# Patient Record
Sex: Male | Born: 1945 | Race: White | Hispanic: No | Smoking: Former smoker
Health system: Southern US, Community
[De-identification: ages and names within clinical notes are randomized; demographics above are authoritative.]

## PROBLEM LIST (undated history)

## (undated) DIAGNOSIS — F29 Unspecified psychosis not due to a substance or known physiological condition: Secondary | ICD-10-CM

## (undated) DIAGNOSIS — Z5900 Homelessness unspecified: Secondary | ICD-10-CM

## (undated) DIAGNOSIS — M792 Neuralgia and neuritis, unspecified: Secondary | ICD-10-CM

## (undated) DIAGNOSIS — J449 Chronic obstructive pulmonary disease, unspecified: Secondary | ICD-10-CM

## (undated) DIAGNOSIS — I712 Thoracic aortic aneurysm, without rupture: Secondary | ICD-10-CM

## (undated) DIAGNOSIS — I495 Sick sinus syndrome: Secondary | ICD-10-CM

## (undated) DIAGNOSIS — I5042 Chronic combined systolic (congestive) and diastolic (congestive) heart failure: Secondary | ICD-10-CM

## (undated) DIAGNOSIS — I350 Nonrheumatic aortic (valve) stenosis: Secondary | ICD-10-CM

## (undated) DIAGNOSIS — F419 Anxiety disorder, unspecified: Secondary | ICD-10-CM

## (undated) DIAGNOSIS — I2510 Atherosclerotic heart disease of native coronary artery without angina pectoris: Secondary | ICD-10-CM

## (undated) DIAGNOSIS — Z950 Presence of cardiac pacemaker: Secondary | ICD-10-CM

## (undated) DIAGNOSIS — F329 Major depressive disorder, single episode, unspecified: Secondary | ICD-10-CM

## (undated) DIAGNOSIS — J939 Pneumothorax, unspecified: Secondary | ICD-10-CM

## (undated) DIAGNOSIS — N400 Enlarged prostate without lower urinary tract symptoms: Secondary | ICD-10-CM

## (undated) DIAGNOSIS — F32A Depression, unspecified: Secondary | ICD-10-CM

## (undated) DIAGNOSIS — I509 Heart failure, unspecified: Secondary | ICD-10-CM

## (undated) DIAGNOSIS — I7121 Aneurysm of the ascending aorta, without rupture: Secondary | ICD-10-CM

## (undated) DIAGNOSIS — Z720 Tobacco use: Secondary | ICD-10-CM

## (undated) DIAGNOSIS — F319 Bipolar disorder, unspecified: Secondary | ICD-10-CM

## (undated) DIAGNOSIS — R7989 Other specified abnormal findings of blood chemistry: Secondary | ICD-10-CM

## (undated) HISTORY — PX: AORTIC VALVE REPLACEMENT: SHX41

## (undated) HISTORY — PX: CARDIAC SURGERY: SHX584

## (undated) HISTORY — PX: HIATAL HERNIA REPAIR: SHX195

## (undated) HISTORY — PX: LUMBAR FUSION: SHX111

## (undated) HISTORY — PX: KNEE SURGERY: SHX244

---

## 2005-10-28 HISTORY — PX: PACEMAKER PLACEMENT: SHX43

## 2014-09-01 ENCOUNTER — Emergency Department (EMERGENCY_DEPARTMENT_HOSPITAL)
Admission: EM | Admit: 2014-09-01 | Discharge: 2014-09-05 | Disposition: A | Discharge status: Home / Self Care | Payer: Medicare Other | Source: Home / Self Care | Attending: Emergency Medicine | Admitting: Emergency Medicine

## 2014-09-01 ENCOUNTER — Emergency Department (HOSPITAL_COMMUNITY): Payer: Medicare Other

## 2014-09-01 ENCOUNTER — Encounter (HOSPITAL_COMMUNITY): Payer: Self-pay | Admitting: Emergency Medicine

## 2014-09-01 DIAGNOSIS — F419 Anxiety disorder, unspecified: Secondary | ICD-10-CM | POA: Insufficient documentation

## 2014-09-01 DIAGNOSIS — R0600 Dyspnea, unspecified: Secondary | ICD-10-CM

## 2014-09-01 DIAGNOSIS — J441 Chronic obstructive pulmonary disease with (acute) exacerbation: Secondary | ICD-10-CM | POA: Insufficient documentation

## 2014-09-01 DIAGNOSIS — Z87891 Personal history of nicotine dependence: Secondary | ICD-10-CM | POA: Insufficient documentation

## 2014-09-01 DIAGNOSIS — R079 Chest pain, unspecified: Secondary | ICD-10-CM

## 2014-09-01 DIAGNOSIS — Z950 Presence of cardiac pacemaker: Secondary | ICD-10-CM | POA: Insufficient documentation

## 2014-09-01 DIAGNOSIS — Z954 Presence of other heart-valve replacement: Secondary | ICD-10-CM | POA: Insufficient documentation

## 2014-09-01 DIAGNOSIS — F309 Manic episode, unspecified: Secondary | ICD-10-CM

## 2014-09-01 DIAGNOSIS — F312 Bipolar disorder, current episode manic severe with psychotic features: Secondary | ICD-10-CM | POA: Diagnosis not present

## 2014-09-01 DIAGNOSIS — F29 Unspecified psychosis not due to a substance or known physiological condition: Secondary | ICD-10-CM

## 2014-09-01 DIAGNOSIS — J449 Chronic obstructive pulmonary disease, unspecified: Secondary | ICD-10-CM

## 2014-09-01 DIAGNOSIS — I2510 Atherosclerotic heart disease of native coronary artery without angina pectoris: Secondary | ICD-10-CM | POA: Diagnosis present

## 2014-09-01 DIAGNOSIS — F3010 Manic episode without psychotic symptoms, unspecified: Secondary | ICD-10-CM

## 2014-09-01 DIAGNOSIS — Z952 Presence of prosthetic heart valve: Secondary | ICD-10-CM

## 2014-09-01 DIAGNOSIS — N400 Enlarged prostate without lower urinary tract symptoms: Secondary | ICD-10-CM | POA: Diagnosis present

## 2014-09-01 DIAGNOSIS — I5032 Chronic diastolic (congestive) heart failure: Secondary | ICD-10-CM | POA: Diagnosis present

## 2014-09-01 HISTORY — DX: Chronic obstructive pulmonary disease, unspecified: J44.9

## 2014-09-01 LAB — COMPREHENSIVE METABOLIC PANEL
ALT: 21 U/L (ref 0–53)
AST: 20 U/L (ref 0–37)
Albumin: 3.3 g/dL — ABNORMAL LOW (ref 3.5–5.2)
Alkaline Phosphatase: 93 U/L (ref 39–117)
Anion gap: 15 (ref 5–15)
BUN: 10 mg/dL (ref 6–23)
CO2: 25 mEq/L (ref 19–32)
Calcium: 9.1 mg/dL (ref 8.4–10.5)
Chloride: 100 mEq/L (ref 96–112)
Creatinine, Ser: 0.9 mg/dL (ref 0.50–1.35)
GFR calc Af Amer: >90 mL/min (ref >90)
GFR calc non Af Amer: 85 mL/min — ABNORMAL LOW (ref >90)
Glucose, Bld: 91 mg/dL (ref 70–99)
Potassium: 4 mEq/L (ref 3.7–5.3)
Sodium: 140 mEq/L (ref 137–147)
Total Bilirubin: 0.9 mg/dL (ref 0.3–1.2)
Total Protein: 6.9 g/dL (ref 6.0–8.3)

## 2014-09-01 LAB — CBC WITH DIFFERENTIAL/PLATELET
Basophils Absolute: 0 10*3/uL (ref 0.0–0.1)
Basophils Relative: 0 % (ref 0–1)
Eosinophils Absolute: 0.1 10*3/uL (ref 0.0–0.7)
Eosinophils Relative: 1 % (ref 0–5)
HCT: 41.2 % (ref 39.0–52.0)
Hemoglobin: 13 g/dL (ref 13.0–17.0)
Lymphocytes Relative: 15 % (ref 12–46)
Lymphs Abs: 2.4 10*3/uL (ref 0.7–4.0)
MCH: 29.6 pg (ref 26.0–34.0)
MCHC: 31.6 g/dL (ref 30.0–36.0)
MCV: 93.8 fL (ref 78.0–100.0)
Monocytes Absolute: 1.3 10*3/uL — ABNORMAL HIGH (ref 0.1–1.0)
Monocytes Relative: 8 % (ref 3–12)
Neutro Abs: 11.6 10*3/uL — ABNORMAL HIGH (ref 1.7–7.7)
Neutrophils Relative %: 76 % (ref 43–77)
Platelets: 241 10*3/uL (ref 150–400)
RBC: 4.39 MIL/uL (ref 4.22–5.81)
RDW: 16.2 % — ABNORMAL HIGH (ref 11.5–15.5)
WBC: 15.4 10*3/uL — ABNORMAL HIGH (ref 4.0–10.5)

## 2014-09-01 LAB — CBG MONITORING, ED
Glucose-Capillary: 164 mg/dL — ABNORMAL HIGH (ref 70–99)
Glucose-Capillary: 84 mg/dL (ref 70–99)

## 2014-09-01 LAB — PROTIME-INR
INR: 1.06 (ref 0.00–1.49)
Prothrombin Time: 14 seconds (ref 11.6–15.2)

## 2014-09-01 LAB — ETHANOL: Alcohol, Ethyl (B): <11 mg/dL (ref 0–11)

## 2014-09-01 LAB — TROPONIN I: Troponin I: <0.3 ng/mL (ref <0.30)

## 2014-09-01 LAB — PRO B NATRIURETIC PEPTIDE: Pro B Natriuretic peptide (BNP): 653.8 pg/mL — ABNORMAL HIGH (ref 0–125)

## 2014-09-01 MED ORDER — LORAZEPAM 2 MG/ML IJ SOLN
1.0000 mg | Freq: Once | INTRAMUSCULAR | Status: AC
  Administered 2014-09-01: 1 mg via INTRAVENOUS
  Filled 2014-09-01: qty 1

## 2014-09-01 MED ORDER — IPRATROPIUM-ALBUTEROL 0.5-2.5 (3) MG/3ML IN SOLN
3.0000 mL | Freq: Four times a day (QID) | RESPIRATORY_TRACT | Status: DC | PRN
  Administered 2014-09-02 – 2014-09-03 (×4): 3 mL via RESPIRATORY_TRACT
  Filled 2014-09-01 (×6): qty 3

## 2014-09-01 MED ORDER — HALOPERIDOL LACTATE 5 MG/ML IJ SOLN
5.0000 mg | Freq: Once | INTRAMUSCULAR | Status: AC
  Administered 2014-09-01: 5 mg via INTRAMUSCULAR
  Filled 2014-09-01: qty 1

## 2014-09-01 MED ORDER — IPRATROPIUM-ALBUTEROL 0.5-2.5 (3) MG/3ML IN SOLN
3.0000 mL | Freq: Once | RESPIRATORY_TRACT | Status: AC
  Administered 2014-09-01 (×2): 3 mL via RESPIRATORY_TRACT
  Filled 2014-09-01: qty 3

## 2014-09-01 MED ORDER — ALBUTEROL SULFATE HFA 108 (90 BASE) MCG/ACT IN AERS
2.0000 | INHALATION_SPRAY | Freq: Once | RESPIRATORY_TRACT | Status: AC
  Administered 2014-09-01: 2 via RESPIRATORY_TRACT
  Filled 2014-09-01: qty 6.7

## 2014-09-01 MED ORDER — LORAZEPAM 1 MG PO TABS
1.0000 mg | ORAL_TABLET | Freq: Three times a day (TID) | ORAL | Status: DC | PRN
  Administered 2014-09-02 – 2014-09-04 (×6): 1 mg via ORAL
  Filled 2014-09-01 (×6): qty 1

## 2014-09-01 MED ORDER — LORAZEPAM 2 MG/ML IJ SOLN
1.0000 mg | Freq: Once | INTRAMUSCULAR | Status: AC
Start: 2014-09-01 — End: 2014-09-01
  Administered 2014-09-01: 1 mg via INTRAVENOUS
  Filled 2014-09-01: qty 1

## 2014-09-01 MED ORDER — ALBUTEROL SULFATE (2.5 MG/3ML) 0.083% IN NEBU
2.5000 mg | INHALATION_SOLUTION | Freq: Once | RESPIRATORY_TRACT | Status: AC
  Administered 2014-09-01: 2.5 mg via RESPIRATORY_TRACT
  Filled 2014-09-01: qty 3

## 2014-09-01 MED ORDER — PREDNISONE 10 MG PO TABS
60.0000 mg | ORAL_TABLET | Freq: Every day | ORAL | Status: DC
  Administered 2014-09-02 – 2014-09-05 (×4): 60 mg via ORAL
  Filled 2014-09-01 (×8): qty 1

## 2014-09-01 MED ORDER — PREDNISONE 50 MG PO TABS
60.0000 mg | ORAL_TABLET | Freq: Once | ORAL | Status: AC
  Administered 2014-09-01: 60 mg via ORAL
  Filled 2014-09-01 (×2): qty 1

## 2014-09-01 MED ORDER — IPRATROPIUM-ALBUTEROL 0.5-2.5 (3) MG/3ML IN SOLN
3.0000 mL | Freq: Once | RESPIRATORY_TRACT | Status: AC
  Administered 2014-09-01: 3 mL via RESPIRATORY_TRACT

## 2014-09-01 MED ORDER — NICOTINE 14 MG/24HR TD PT24
14.0000 mg | MEDICATED_PATCH | Freq: Every day | TRANSDERMAL | Status: DC
  Administered 2014-09-03 – 2014-09-05 (×3): 14 mg via TRANSDERMAL
  Filled 2014-09-01 (×3): qty 1

## 2014-09-01 MED ORDER — IPRATROPIUM-ALBUTEROL 0.5-2.5 (3) MG/3ML IN SOLN
RESPIRATORY_TRACT | Status: AC
  Administered 2014-09-01: 3 mL via RESPIRATORY_TRACT
  Filled 2014-09-01: qty 3

## 2014-09-01 NOTE — ED Notes (Signed)
Refusing blood work

## 2014-09-01 NOTE — ED Notes (Signed)
Patient changed into maroon paper scrubs. Patient agitated, cussing, and yelling at staff. Belongings labeled and secured in ED locker and with security.

## 2014-09-01 NOTE — ED Notes (Signed)
Patient states he is [name] [name] and is living in [place]. 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

## 2014-09-01 NOTE — ED Notes (Signed)
Patient insistent that his "helocopter is going to come pick me up. I called my buddy to bring my truck too. When he gets here I'm leaving." patient maintains that he is [name] and that he is "living incognito."

## 2014-09-01 NOTE — ED Provider Notes (Signed)
CSN: [id number]     Arrival date & time 09/01/14  1450 History   First MD Initiated Contact with Patient 09/01/14 1516     Chief Complaint  Patient presents with  . Chest Pain     Patient is a 68 y.o. male presenting with shortness of breath. The history is provided by the patient.  Shortness of Breath Severity:  Moderate Onset quality:  Gradual Duration: several days ago. Timing:  Constant Progression:  Worsening Chronicity:  Recurrent Relieved by: albuterol. Worsened by:  Nothing tried Associated symptoms: chest pain and cough   Associated symptoms: no fever and no vomiting   Associated symptoms comment:  Chronic chest wall pain  Patient reports recent increased cough and SOB He reports he needs nebulizer and needs to have his glucose checked He reports chronic chest wall pain since having "pig valve" [facility]    Past Medical History  Diagnosis Date  . H/O aortic valve replacement   . COPD (chronic obstructive pulmonary disease)    Past Surgical History  Procedure Laterality Date  . Cardiac surgery    . Pacemaker placement    . Hiatal hernia repair    . Knee surgery     No family history on file. History  Substance Use Topics  . Smoking status: Former Games developermoker  . Smokeless tobacco: Not on file  . Alcohol Use: Yes    Review of Systems  Constitutional: Negative for fever.  Respiratory: Positive for cough and shortness of breath.   Cardiovascular: Positive for chest pain.  Gastrointestinal: Negative for vomiting.  Psychiatric/Behavioral: The patient is nervous/anxious.   All other systems reviewed and are negative.     Allergies  Review of patient's allergies indicates no known allergies.  Home Medications   Prior to Admission medications   Not on File   BP 142/98 mmHg  Pulse 92  Temp(Src) 97.9 F (36.6 C) (Oral)  Resp 16  Ht 5\' 11"  (1.803 m)  Wt 165 lb (74.844 kg)  BMI 23.02 kg/m2  SpO2 96% Physical Exam CONSTITUTIONAL:  anxious, rapid speech noted HEAD: Normocephalic/atraumatic EYES: EOMI/PERRL ENMT: Mucous membranes moist NECK: supple no meningeal signs SPINE:entire spine nontender CV: S1/S2 noted, no murmurs/rubs/gallops noted Chest - midline scar noted, well healed LUNGS:tachypneic, wheezing bilaterally ABDOMEN: soft, nontender, no rebound or guarding NEURO: Pt is awake/alert, moves all extremitiesx4 EXTREMITIES: pulses normal, full ROM, no LE edema noted SKIN: warm, color normal PSYCH: anxious  ED Course  Procedures   CRITICAL CARE Performed by: Joya GaskinsWICKLINE,Marteze Vecchio W Total critical care time: 33 Critical care time was exclusive of separately billable procedures and treating other patients. Critical care was necessary to treat or prevent imminent or life-threatening deterioration. Critical care was time spent personally by me on the following activities: development of treatment plan with patient and/or surrogate as well as nursing, discussions with consultants, evaluation of patient's response to treatment, examination of patient, obtaining history from patient or surrogate, ordering and performing treatments and interventions, ordering and review of laboratory studies, ordering and review of radiographic studies, pulse oximetry and re-evaluation of patient's condition. 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  Concern for psychosis Law Enforcement has been  called 6:32 PM Patient is more calm at this time Initial labs/imaging negative Will consult psych 8:15 PM Pt resting comfortably  He is arousable Once he is fully awake will need psych consult Pt medically stable at this time BP 142/98 mmHg  Pulse 92  Temp(Src) 97.9 F (36.6 C) (Oral)  Resp 16  Ht 5\' 11"  (1.803 m)  Wt 165 lb (74.844 kg)  BMI 23.02 kg/m2  SpO2 94%  Labs Review Labs Reviewed  COMPREHENSIVE METABOLIC PANEL - Abnormal; Notable for the following:    Albumin 3.3 (*)    GFR calc non Af Amer 85 (*)    All other components within normal limits  CBC WITH DIFFERENTIAL - Abnormal; Notable for the following:    WBC 15.4 (*)    RDW 16.2 (*)    Neutro Abs 11.6 (*)    Monocytes Absolute 1.3 (*)    All other components within normal limits  PRO B NATRIURETIC PEPTIDE - Abnormal; Notable for the following:    Pro B Natriuretic peptide (BNP) 653.8 (*)    All other components within normal limits  ETHANOL  PROTIME-INR  TROPONIN I  URINE RAPID DRUG SCREEN (HOSP PERFORMED)  CBG MONITORING, ED    Imaging Review Dg Chest Portable 1 View  09/01/2014   CLINICAL DATA:  Pt complained of CP x 3 hrs and SOB upon arrival to ED tonight. Pt is uncooperative and aggressive. Pt to be involuntarily committed. Per Pts chart - aortic valve replacement, COPD, pacemaker, hiatal hernia repair, former smoker  EXAM: PORTABLE CHEST - 1 VIEW  COMPARISON:  None.  FINDINGS: Pacer. Prior median sternotomy. Remote right rib trauma. Chin overlies the apices minimally. Normal heart size. No pleural effusion or pneumothorax. Surgical clips project over the right lung apex laterally. Hyperinflation.  IMPRESSION: Hyperinflation without acute superimposed process.   Electronically Signed   By: Jeronimo GreavesKyle  Talbot M.D.   On: 09/01/2014 17:56     Date: 09/01/2014 20:08  Rate: 85  Rhythm: normal sinus rhythm  QRS Axis: left  Intervals: normal  ST/T Wave abnormalities: nonspecific ST changes  Conduction  Disutrbances:ventricular paced  Narrative Interpretation:   Old EKG Reviewed: none available    MDM   Final diagnoses:  Chronic obstructive pulmonary disease, unspecified COPD, unspecified chronic bronchitis type  Psychosis, unspecified psychosis type    Nursing notes including past medical history and social history reviewed and considered in documentation xrays reviewed and considered Labs/vital reviewed and considered     Joya Gaskinsonald W Ruhama Lehew, MD 09/01/14 2016

## 2014-09-01 NOTE — BH Assessment (Signed)
[facility] Assessment Progress Note   Clinician spoke to nurse Ginger.  Patient is not in condition to be assessed at this time.  Pt had been given ativan to aid in calming his agitated state.  Will attempt to assess later.
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
## 2014-09-01 NOTE — ED Notes (Signed)
Patient arrives via EMS with c/o chest pain that started 3 hours ago. Reports needing a breathing treatment. Verbally aggressive and abusive to staff, states he "will get what he wants". Patient states he has a broken sternum.

## 2014-09-02 ENCOUNTER — Other Ambulatory Visit: Payer: Self-pay

## 2014-09-02 LAB — RAPID URINE DRUG SCREEN, HOSP PERFORMED
Amphetamines: NOT DETECTED
Barbiturates: NOT DETECTED
Benzodiazepines: NOT DETECTED
Cocaine: NOT DETECTED
Opiates: NOT DETECTED
Tetrahydrocannabinol: NOT DETECTED

## 2014-09-02 LAB — CBG MONITORING, ED
Glucose-Capillary: 142 mg/dL — ABNORMAL HIGH (ref 70–99)
Glucose-Capillary: 102 mg/dL — ABNORMAL HIGH (ref 70–99)

## 2014-09-02 MED ORDER — LORAZEPAM 2 MG/ML IJ SOLN
1.0000 mg | Freq: Once | INTRAMUSCULAR | Status: AC
  Administered 2014-09-02: 1 mg via INTRAVENOUS
  Filled 2014-09-02: qty 1

## 2014-09-02 MED ORDER — ALBUTEROL SULFATE HFA 108 (90 BASE) MCG/ACT IN AERS
2.0000 | INHALATION_SPRAY | RESPIRATORY_TRACT | Status: DC | PRN
  Administered 2014-09-02 – 2014-09-05 (×9): 2 via RESPIRATORY_TRACT
  Filled 2014-09-02: qty 6.7

## 2014-09-02 MED ORDER — IPRATROPIUM-ALBUTEROL 0.5-2.5 (3) MG/3ML IN SOLN
3.0000 mL | Freq: Once | RESPIRATORY_TRACT | Status: AC
Start: 2014-09-02 — End: 2014-09-02
  Administered 2014-09-02: 3 mL via RESPIRATORY_TRACT

## 2014-09-02 MED ORDER — ZIPRASIDONE MESYLATE 20 MG IM SOLR
10.0000 mg | Freq: Once | INTRAMUSCULAR | Status: AC
  Administered 2014-09-02: 10 mg via INTRAMUSCULAR
  Filled 2014-09-02: qty 20

## 2014-09-02 MED ORDER — STERILE WATER FOR INJECTION IJ SOLN
INTRAMUSCULAR | Status: AC
  Administered 2014-09-02: 16:00:00
  Filled 2014-09-02: qty 10

## 2014-09-02 NOTE — ED Notes (Signed)
Pt out in hall hollaring that he wants his nebulizer treatment. [employer] called.

## 2014-09-02 NOTE — ED Notes (Signed)
Pt resting with lights off.  

## 2014-09-02 NOTE — ED Notes (Signed)
Spoke with [name] [name] form [facility] who stated that Dr. Tonita PhoenixKomissarova declined to accept pt based on his medical conditions (increased BNP and QTC level).

## 2014-09-02 NOTE — Progress Notes (Signed)
Pt has been assessed and meets inpatient criteria for treatment. Pt.'s clinicals faxed out to:   [name]declined due to acuity  Derrell Lollingoris Royalty Fakhouri, MSW  Social Worker [phone]

## 2014-09-02 NOTE — ED Notes (Signed)
Pt ambulating to bathroom with NT, swinging arms as NT is helping steady pt due to stumbling gait. Pt after using the bathroom yelling at security and refusing to return to room. [employer] called to return to ED. Pt agreed to walk to room but will not allow NT to complete CBG.
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
## 2014-09-02 NOTE — ED Notes (Signed)
[facility] called and stated that they would not be able to accept [name] [name]. Documented by Garrison ColumbusJoanna Keith, RN.  [facility] has also called stating they declined pt, per T. Waddell, NS.

## 2014-09-02 NOTE — ED Notes (Signed)
Pt requesting his inhaler.

## 2014-09-02 NOTE — ED Notes (Signed)
Pt resting on stretcher with eyes shut.  No distress.
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
## 2014-09-02 NOTE — ED Notes (Signed)
[employer] at bedside. Pt agreeing to allow NT to do CBG.

## 2014-09-02 NOTE — ED Notes (Signed)
TTS consult completed 

## 2014-09-02 NOTE — ED Notes (Signed)
TTS consult underway.  

## 2014-09-02 NOTE — ED Notes (Signed)
Escorted pt to the bathroom.

## 2014-09-02 NOTE — ED Notes (Addendum)
Pt given graham crackers and peanut butter and water to drink. Pt denies any pain or distress at present.

## 2014-09-02 NOTE — ED Notes (Signed)
[facility] has left. Pt is resting in bed again.

## 2014-09-02 NOTE — ED Notes (Signed)
In to give pt his lunch tray. Pt wanting his clothes so he can leave. Explained to him that he had IVC paperwork and so he stated we weren't keeping him and that he was gonna levae. [place] PD called for assistance.

## 2014-09-02 NOTE — ED Notes (Signed)
Pt no respiratory distress.  sats 95 on room air.

## 2014-09-03 LAB — CBG MONITORING, ED
Glucose-Capillary: 74 mg/dL (ref 70–99)
Glucose-Capillary: 107 mg/dL — ABNORMAL HIGH (ref 70–99)
Glucose-Capillary: 147 mg/dL — ABNORMAL HIGH (ref 70–99)
Glucose-Capillary: 167 mg/dL — ABNORMAL HIGH (ref 70–99)

## 2014-09-03 MED ORDER — IPRATROPIUM-ALBUTEROL 0.5-2.5 (3) MG/3ML IN SOLN
3.0000 mL | Freq: Two times a day (BID) | RESPIRATORY_TRACT | Status: DC
  Filled 2014-09-03: qty 3

## 2014-09-03 MED ORDER — IPRATROPIUM-ALBUTEROL 0.5-2.5 (3) MG/3ML IN SOLN
3.0000 mL | RESPIRATORY_TRACT | Status: DC
  Administered 2014-09-03 – 2014-09-04 (×6): 3 mL via RESPIRATORY_TRACT
  Filled 2014-09-03 (×5): qty 3

## 2014-09-03 MED ORDER — LORAZEPAM 2 MG/ML IJ SOLN
2.0000 mg | Freq: Once | INTRAMUSCULAR | Status: AC
  Administered 2014-09-03: 2 mg via INTRAMUSCULAR
  Filled 2014-09-03: qty 1

## 2014-09-03 MED ORDER — ALBUTEROL SULFATE (2.5 MG/3ML) 0.083% IN NEBU
INHALATION_SOLUTION | RESPIRATORY_TRACT | Status: AC
  Administered 2014-09-03: 2.5 mg
  Filled 2014-09-03: qty 3

## 2014-09-03 MED ORDER — ZIPRASIDONE MESYLATE 20 MG IM SOLR
20.0000 mg | Freq: Once | INTRAMUSCULAR | Status: AC
  Administered 2014-09-03: 20 mg via INTRAMUSCULAR
  Filled 2014-09-03: qty 20

## 2014-09-03 MED ORDER — ZIPRASIDONE MESYLATE 20 MG IM SOLR: 10.0000 mg | Freq: Once | INTRAMUSCULAR | Status: DC

## 2014-09-03 MED ORDER — STERILE WATER FOR INJECTION IJ SOLN
INTRAMUSCULAR | Status: AC
  Administered 2014-09-03: 1.2 mL
  Filled 2014-09-03: qty 10

## 2014-09-03 NOTE — ED Notes (Signed)
Patient urinated in urinal, threw it in the floor, then continued urinating in the bed.  Cleaned patient w/soap and water.  Had PD to escort patient to the BR.  Cleaned bed.  Upon return, patient continues to be agitated, cursing.  Unable to reason w/patient.  He continuously requests neb treatment and when instructed that it is not due, he continues to talk in agitated tones.  His conversation is nonsensical at this point.  Geodon ordered by Dr. Fayrene FearingJames.

## 2014-09-03 NOTE — ED Notes (Signed)
Patient requesting neb tx.  He is tachypneic and has DOE and at rest.  Spoke w/Dr. Fayrene FearingJames about increasing frequency of nebs.  He approved q 4 hours.

## 2014-09-03 NOTE — BH Assessment (Signed)
[facility] Assessment Progress Note  Update: The following facilities were contacted by this clinician in an attempt to place the pt:  Referral Faxed For Review [facility] - beds per Cl[facility] - Fax per [facility]- Faxed for possible wait list Earlene Plateravis - beds per Luretha Ruedammy [facility]an - beds per Areta HaberBarbara Brynn Marr - beds per Kristian CoveyKristen Rutherford - beds per Addison Lankindy Frye - fax referral  At [facility] per Dannielle Huhanny  OV per Wendi MayaAnnie [facility] per [facility] per [facility]el RoEvelyn [facility] [place] per [facility] per [facility] per [facility]n [facility] per Fonda Kinderella [facility] per [facility]   No Answer [place] @ [phone] [facility] @ [phone] [facility] @ [phone]   Pt Declined OV per Pattricia BossAnnie @ 0830 due to behavioral acuity  TTS will continue to seek placement for the pt.  Casimer LaniusKristen Janiah Devinney, MS, [facility] Licensed Professional Counselor Therapeutic Triage Specialist [facility] Phone: [phone] Fax: [phone]

## 2014-09-03 NOTE — ED Notes (Signed)
Pt up to bedside commode to have a BM. Pt given water as well. Pt is convinced that [employer] has mistaken his identity and that is why he is here and he expects to be out of here by 6am.

## 2014-09-03 NOTE — ED Notes (Signed)
Patient assisted onto stretcher.  Lights turned off.  Encouraged to sleep.

## 2014-09-03 NOTE — ED Notes (Signed)
Patient assisted to bathroom by [place] PD.
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
## 2014-09-03 NOTE — ED Provider Notes (Signed)
Pt awaiting placement, no acute issues at this time BP 124/79 mmHg  Pulse 103  Temp(Src) 97.8 F (36.6 C) (Oral)  Resp 18  Ht 5\' 11"  (1.803 m)  Wt 165 lb (74.844 kg)  BMI 23.02 kg/m2  SpO2 97%   Joya Gaskinsonald W Artemio Dobie, MD 09/03/14 2318

## 2014-09-03 NOTE — ED Notes (Addendum)
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

## 2014-09-03 NOTE — ED Notes (Signed)
Patient yelling out for breathing treatment. Resp therapist called.

## 2014-09-03 NOTE — BH Assessment (Addendum)
Binnie RailJoann Glover, [facility], adult unit is at capacity. Contacted the following facilities for placement:  BED AVAILABLE, PT IS UNDER REVIEW: [facility], per Lucita LoraJane [facility], per [facility], per Erskine SquibbJane  AT CAPACITY: [facility], per [facility], per Earl LitesJennifer [facility] per [facility], per [facility], per [facility], per [facility], per [facility], per [facility], per [facility], per [facility], per [facility], per Leticia PennaLarry [facility], per [facility], per [facility], per Baldo Asharl  NO RESPONSE: [facility]  PT DECLINED: [facility]   Shondra Capps Orson EvaEllis Rhilee Currin Jr, Wisconsin[place]PC, [facility] Triage Specialist [phone]

## 2014-09-03 NOTE — ED Notes (Signed)
Pt repeatedly asking for things: decaf coffee, socks, comb, lady Technical sales engineerofficer, etc.

## 2014-09-04 ENCOUNTER — Encounter (HOSPITAL_COMMUNITY): Payer: Self-pay | Admitting: *Deleted

## 2014-09-04 DIAGNOSIS — F29 Unspecified psychosis not due to a substance or known physiological condition: Secondary | ICD-10-CM | POA: Insufficient documentation

## 2014-09-04 DIAGNOSIS — F22 Delusional disorders: Secondary | ICD-10-CM

## 2014-09-04 DIAGNOSIS — F39 Unspecified mood [affective] disorder: Secondary | ICD-10-CM

## 2014-09-04 LAB — CBG MONITORING, ED
Glucose-Capillary: 158 mg/dL — ABNORMAL HIGH (ref 70–99)
Glucose-Capillary: 113 mg/dL — ABNORMAL HIGH (ref 70–99)

## 2014-09-04 LAB — CBC WITH DIFFERENTIAL/PLATELET
Basophils Absolute: 0 10*3/uL (ref 0.0–0.1)
Basophils Relative: 0 % (ref 0–1)
Eosinophils Absolute: 0 10*3/uL (ref 0.0–0.7)
Eosinophils Relative: 0 % (ref 0–5)
HCT: 39 % (ref 39.0–52.0)
Hemoglobin: 12.5 g/dL — ABNORMAL LOW (ref 13.0–17.0)
Lymphocytes Relative: 16 % (ref 12–46)
Lymphs Abs: 1.4 10*3/uL (ref 0.7–4.0)
MCH: 29.8 pg (ref 26.0–34.0)
MCHC: 32.1 g/dL (ref 30.0–36.0)
MCV: 93.1 fL (ref 78.0–100.0)
Monocytes Absolute: 0.9 10*3/uL (ref 0.1–1.0)
Monocytes Relative: 10 % (ref 3–12)
Neutro Abs: 6.5 10*3/uL (ref 1.7–7.7)
Neutrophils Relative %: 74 % (ref 43–77)
Platelets: 230 10*3/uL (ref 150–400)
RBC: 4.19 MIL/uL — ABNORMAL LOW (ref 4.22–5.81)
RDW: 15.7 % — ABNORMAL HIGH (ref 11.5–15.5)
WBC: 8.7 10*3/uL (ref 4.0–10.5)

## 2014-09-04 LAB — PRO B NATRIURETIC PEPTIDE: Pro B Natriuretic peptide (BNP): 333 pg/mL — ABNORMAL HIGH (ref 0–125)

## 2014-09-04 MED ORDER — TRAZODONE HCL 50 MG PO TABS
100.0000 mg | ORAL_TABLET | Freq: Every day | ORAL | Status: DC
  Administered 2014-09-04 (×2): 100 mg via ORAL
  Filled 2014-09-04 (×2): qty 2

## 2014-09-04 MED ORDER — ATORVASTATIN CALCIUM 40 MG PO TABS
40.0000 mg | ORAL_TABLET | Freq: Every day | ORAL | Status: DC
  Administered 2014-09-04: 40 mg via ORAL
  Filled 2014-09-04 (×2): qty 1

## 2014-09-04 MED ORDER — ASPIRIN 81 MG PO CHEW
81.0000 mg | CHEWABLE_TABLET | Freq: Every day | ORAL | Status: DC
  Administered 2014-09-04 – 2014-09-05 (×2): 81 mg via ORAL
  Filled 2014-09-04 (×2): qty 1

## 2014-09-04 MED ORDER — IPRATROPIUM-ALBUTEROL 0.5-2.5 (3) MG/3ML IN SOLN
3.0000 mL | Freq: Three times a day (TID) | RESPIRATORY_TRACT | Status: DC
  Administered 2014-09-04: 3 mL via RESPIRATORY_TRACT
  Filled 2014-09-04: qty 3

## 2014-09-04 MED ORDER — GABAPENTIN 100 MG PO CAPS
100.0000 mg | ORAL_CAPSULE | Freq: Three times a day (TID) | ORAL | Status: DC
  Administered 2014-09-04 – 2014-09-05 (×4): 100 mg via ORAL
  Filled 2014-09-04 (×7): qty 1

## 2014-09-04 MED ORDER — BACITRACIN ZINC 500 UNIT/GM EX OINT
TOPICAL_OINTMENT | CUTANEOUS | Status: AC
  Filled 2014-09-04: qty 0.9

## 2014-09-04 MED ORDER — OLANZAPINE 5 MG PO TABS
5.0000 mg | ORAL_TABLET | Freq: Every day | ORAL | Status: DC
  Administered 2014-09-04 – 2014-09-05 (×2): 5 mg via ORAL
  Filled 2014-09-04 (×3): qty 1

## 2014-09-04 MED ORDER — ALBUTEROL SULFATE (2.5 MG/3ML) 0.083% IN NEBU
INHALATION_SOLUTION | RESPIRATORY_TRACT | Status: AC
  Administered 2014-09-04: 2.5 mg
  Filled 2014-09-04: qty 3

## 2014-09-04 MED ORDER — PANTOPRAZOLE SODIUM 40 MG PO TBEC
40.0000 mg | DELAYED_RELEASE_TABLET | Freq: Every day | ORAL | Status: DC
  Administered 2014-09-04 – 2014-09-05 (×2): 40 mg via ORAL
  Filled 2014-09-04 (×2): qty 1

## 2014-09-04 NOTE — ED Notes (Addendum)
Pt attempting to antagonize officer, making statements about race and his performance as an Technical sales engineerofficer. Rapid/pressured speech and flight of ideas.

## 2014-09-04 NOTE — ED Notes (Signed)
Nurse, [employer], and security in with pt.

## 2014-09-04 NOTE — ED Provider Notes (Signed)
Pt was awake most of the night, talking almost constantly His presentation is likely due to manic episode On review of [facility]:  Pt with Aortic valve replacement in September 2015 He has done well post-op.  His meds have been updated per CareEverywhere (he is not on coumadin) [facility] was [facility] he had manic episode at that time.  It was recommended at that time to administer olanzapine per psychiatry.  This was ordered today.  Joya Gaskinsonald W Resha Filippone, MD 09/04/14 [phone]

## 2014-09-04 NOTE — ED Notes (Signed)
Ford called and given new lab results.

## 2014-09-04 NOTE — ED Notes (Signed)
[facility] stating that [facility] on [facility] list if pt's WBC and BNP is normal.

## 2014-09-04 NOTE — ED Notes (Signed)
Pt had to be schakelled to the bed due to his behavior. Pt walking around fumbling through drawers, making racial remarks to officer, and shouting out demands.

## 2014-09-04 NOTE — ED Notes (Signed)
Continues non stop talking, rambling.

## 2014-09-04 NOTE — ED Notes (Signed)
Patient given graham crackers and Diet Coke at this time.

## 2014-09-04 NOTE — BH Assessment (Signed)
[facility].  Harlin RainFord Ellis Ria CommentWarrick Jr, LPC, [facility] Triage Specialist [phone]
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
## 2014-09-04 NOTE — ED Notes (Signed)
Pt now requesting the phone number for [facility] [place]

## 2014-09-04 NOTE — ED Notes (Signed)
Still nonstop talking, still claims to be ted nugent, however when asked directly he is able to give me names and towns of residence of his 2 sons. States he hasn't seen them for 3 years. A Google search of his sons gave me the following:  Tiana LoftJustin Howard Lesser  dob 02/12/82 [address]  [place]  [place]an Trent Beaver dob [birth year]/24/73 [address] [place] [id number]

## 2014-09-04 NOTE — ED Provider Notes (Signed)
[facility]. [facility]ing to [facility], ([place], [place], etc).  If not controlled with Zyprexa, recommend risperdal 0.25mg  po q 12 hours.  Rolland PorterMark Nykia Turko, MD 09/04/14 1010

## 2014-09-04 NOTE — ED Notes (Signed)
Patient given socks at this time per his request

## 2014-09-04 NOTE — ED Notes (Signed)
IVC Forms [facility] Nurse and MD notes from visit  All faxed to [facility] at [phone]
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
## 2014-09-04 NOTE — ED Notes (Signed)
Awake, argumentative. Escorted to bathroom, then attempted to leave. Escorted back to room by officer and 4 security guards. Now sitting in chair in his room. Demanding to have a phone so [facility] to prove he's sane. [place] officer provided him with the phone number for [facility] and he called. He was told to call back Monday morning

## 2014-09-04 NOTE — BH Assessment (Signed)
[facility] She says they need a "BNP" and a note from physician regarding elevated white count or a repeat lab indicating a normal white count. Gunnar Fusiaula said Pt can be accepted to wait list once they receive this information. Notified Tenna DelaineLori Hutchens, RN of request.  Harlin RainFord Ellis Patsy BaltimoreWarrick Jr, [facility], [facility] Triage Specialist [phone]

## 2014-09-04 NOTE — BH Assessment (Signed)
[facility] Assessment Progress Note  Update:  Received call from Chelsea AusLisa Woodard [facility] @ 1330 stating pt is being placed on their wait list.  Faxed IVC papers to her as requested.  Casimer LaniusKristen Jennamarie Goings, MS, [facility] Licensed Professional Counselor Therapeutic Triage Specialist [facility] Phone: [phone] Fax: [phone]

## 2014-09-04 NOTE — ED Notes (Signed)
Patient refuses shower at this time states he is going home in a little bit.

## 2014-09-04 NOTE — ED Notes (Signed)
Slept for 30 min then up to bathroom. Sitting in chair states he has to get out of here by 5am to go rabbit hunting. Getting argumentative with me over why he has to stay here. I directed him back to bed and currently is lying quietly on bed

## 2014-09-05 ENCOUNTER — Encounter (HOSPITAL_COMMUNITY): Payer: Self-pay | Admitting: *Deleted

## 2014-09-05 ENCOUNTER — Inpatient Hospital Stay (HOSPITAL_COMMUNITY)
Admission: AD | Admit: 2014-09-05 | Discharge: 2014-09-12 | DRG: 885 | Disposition: A | Discharge status: Home / Self Care | Payer: Medicare Other | Source: Intra-hospital | Attending: Emergency Medicine | Admitting: Emergency Medicine

## 2014-09-05 ENCOUNTER — Telehealth (HOSPITAL_COMMUNITY): Payer: Self-pay | Admitting: Physician Assistant

## 2014-09-05 DIAGNOSIS — F312 Bipolar disorder, current episode manic severe with psychotic features: Secondary | ICD-10-CM | POA: Diagnosis present

## 2014-09-05 DIAGNOSIS — I2510 Atherosclerotic heart disease of native coronary artery without angina pectoris: Secondary | ICD-10-CM | POA: Diagnosis present

## 2014-09-05 DIAGNOSIS — I5032 Chronic diastolic (congestive) heart failure: Secondary | ICD-10-CM | POA: Diagnosis present

## 2014-09-05 DIAGNOSIS — F302 Manic episode, severe with psychotic symptoms: Secondary | ICD-10-CM | POA: Insufficient documentation

## 2014-09-05 DIAGNOSIS — J449 Chronic obstructive pulmonary disease, unspecified: Secondary | ICD-10-CM | POA: Diagnosis present

## 2014-09-05 DIAGNOSIS — Z952 Presence of prosthetic heart valve: Secondary | ICD-10-CM | POA: Diagnosis not present

## 2014-09-05 DIAGNOSIS — I350 Nonrheumatic aortic (valve) stenosis: Secondary | ICD-10-CM

## 2014-09-05 DIAGNOSIS — F29 Unspecified psychosis not due to a substance or known physiological condition: Secondary | ICD-10-CM | POA: Diagnosis present

## 2014-09-05 DIAGNOSIS — R079 Chest pain, unspecified: Secondary | ICD-10-CM

## 2014-09-05 DIAGNOSIS — N400 Enlarged prostate without lower urinary tract symptoms: Secondary | ICD-10-CM | POA: Diagnosis present

## 2014-09-05 DIAGNOSIS — I2584 Coronary atherosclerosis due to calcified coronary lesion: Secondary | ICD-10-CM

## 2014-09-05 DIAGNOSIS — Z950 Presence of cardiac pacemaker: Secondary | ICD-10-CM | POA: Diagnosis not present

## 2014-09-05 HISTORY — DX: Anxiety disorder, unspecified: F41.9

## 2014-09-05 HISTORY — DX: Pneumothorax, unspecified: J93.9

## 2014-09-05 HISTORY — DX: Thoracic aortic aneurysm, without rupture: I71.2

## 2014-09-05 HISTORY — DX: Aneurysm of the ascending aorta, without rupture: I71.21

## 2014-09-05 HISTORY — DX: Sick sinus syndrome: I49.5

## 2014-09-05 HISTORY — DX: Atherosclerotic heart disease of native coronary artery without angina pectoris: I25.10

## 2014-09-05 HISTORY — DX: Depression, unspecified: F32.A

## 2014-09-05 HISTORY — DX: Nonrheumatic aortic (valve) stenosis: I35.0

## 2014-09-05 HISTORY — DX: Presence of cardiac pacemaker: Z95.0

## 2014-09-05 HISTORY — DX: Benign prostatic hyperplasia without lower urinary tract symptoms: N40.0

## 2014-09-05 HISTORY — DX: Major depressive disorder, single episode, unspecified: F32.9

## 2014-09-05 MED ORDER — QUETIAPINE FUMARATE 50 MG PO TABS
50.0000 mg | ORAL_TABLET | Freq: Every day | ORAL | Status: DC
  Administered 2014-09-05: 50 mg via ORAL
  Filled 2014-09-05 (×3): qty 1

## 2014-09-05 MED ORDER — ALBUTEROL SULFATE (2.5 MG/3ML) 0.083% IN NEBU: 2.5000 mg | INHALATION_SOLUTION | Freq: Four times a day (QID) | RESPIRATORY_TRACT | Status: DC | PRN

## 2014-09-05 MED ORDER — ALBUTEROL SULFATE HFA 108 (90 BASE) MCG/ACT IN AERS
INHALATION_SPRAY | RESPIRATORY_TRACT | Status: AC
  Filled 2014-09-05: qty 6.7

## 2014-09-05 MED ORDER — ACETAMINOPHEN 325 MG PO TABS
650.0000 mg | ORAL_TABLET | Freq: Four times a day (QID) | ORAL | Status: DC | PRN
  Administered 2014-09-05 – 2014-09-11 (×7): 650 mg via ORAL
  Filled 2014-09-05 (×7): qty 2

## 2014-09-05 MED ORDER — IPRATROPIUM BROMIDE 0.02 % IN SOLN
0.5000 mg | Freq: Four times a day (QID) | RESPIRATORY_TRACT | Status: DC | PRN
  Administered 2014-09-05 – 2014-09-06 (×2): 0.5 mg via RESPIRATORY_TRACT
  Filled 2014-09-05 (×2): qty 2.5

## 2014-09-05 MED ORDER — MAGNESIUM HYDROXIDE 400 MG/5ML PO SUSP: 30.0000 mL | Freq: Every day | ORAL | Status: DC | PRN

## 2014-09-05 MED ORDER — ALUM & MAG HYDROXIDE-SIMETH 200-200-20 MG/5ML PO SUSP
30.0000 mL | ORAL | Status: DC | PRN
  Administered 2014-09-07: 30 mL via ORAL
  Filled 2014-09-05: qty 30

## 2014-09-05 MED ORDER — ALBUTEROL SULFATE HFA 108 (90 BASE) MCG/ACT IN AERS
2.0000 | INHALATION_SPRAY | RESPIRATORY_TRACT | Status: DC | PRN
  Administered 2014-09-05: 2 via RESPIRATORY_TRACT

## 2014-09-05 MED ORDER — IPRATROPIUM BROMIDE HFA 17 MCG/ACT IN AERS
2.0000 | INHALATION_SPRAY | RESPIRATORY_TRACT | Status: DC
  Administered 2014-09-05 – 2014-09-06 (×3): 2 via RESPIRATORY_TRACT
  Filled 2014-09-05: qty 12.9

## 2014-09-05 MED ORDER — TRAZODONE HCL 50 MG PO TABS
50.0000 mg | ORAL_TABLET | Freq: Every evening | ORAL | Status: DC | PRN
  Administered 2014-09-05 – 2014-09-08 (×5): 50 mg via ORAL
  Filled 2014-09-05 (×14): qty 1

## 2014-09-05 MED ORDER — ALBUTEROL SULFATE HFA 108 (90 BASE) MCG/ACT IN AERS
2.0000 | INHALATION_SPRAY | RESPIRATORY_TRACT | Status: DC | PRN
  Administered 2014-09-06: 2 via RESPIRATORY_TRACT
  Filled 2014-09-05: qty 6.7

## 2014-09-05 MED ORDER — ALBUTEROL SULFATE (2.5 MG/3ML) 0.083% IN NEBU
2.5000 mg | INHALATION_SOLUTION | Freq: Once | RESPIRATORY_TRACT | Status: AC
  Administered 2014-09-05: 2.5 mg via RESPIRATORY_TRACT
  Filled 2014-09-05: qty 3

## 2014-09-05 MED ORDER — ALBUTEROL SULFATE (2.5 MG/3ML) 0.083% IN NEBU
2.5000 mg | INHALATION_SOLUTION | RESPIRATORY_TRACT | Status: DC | PRN
  Administered 2014-09-05 – 2014-09-06 (×2): 2.5 mg via RESPIRATORY_TRACT
  Filled 2014-09-05 (×2): qty 3

## 2014-09-05 MED ORDER — IPRATROPIUM-ALBUTEROL 0.5-2.5 (3) MG/3ML IN SOLN
3.0000 mL | Freq: Four times a day (QID) | RESPIRATORY_TRACT | Status: DC
  Administered 2014-09-05: 3 mL via RESPIRATORY_TRACT
  Filled 2014-09-05: qty 3

## 2014-09-05 NOTE — ED Notes (Signed)
Doris from [facility] called to get update on pt.  Plan to reassess pt again today for possible placement.

## 2014-09-05 NOTE — Progress Notes (Signed)
Patient ID: FABIO WAH, male   DOB: Sep 20, 1946, 68 y.o.   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 Pt is grandiose talking about his many businesses and how he need to discharge to [place]. Pt denies suicidal /homicidal ideation intent and plan. Pt denies auditory and visual hallucination. Pt attended evening wrap up group and engaged in discussion. No acute distressed noted at this time.   A: Met with pt 1:1. Medications administered as prescribed. Writer encouraged pt to discuss feelings. Pt encouraged to come to staff with any questions or concerns.   R: Patient is safe on the unit. He is complaint with medications and denies any adverse reaction.
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
## 2014-09-05 NOTE — ED Notes (Signed)
Gave patient's belongings to officer from [place] [employer]. Officer stated that patient's knife would be taken to police department to be held until patient picks up after discharged from facility. Data processing managerAdvised Patrice RN at [facility].

## 2014-09-05 NOTE — ED Provider Notes (Signed)
Patient has been accepted to behavioral health. Accepting physician is Dr. Florentina JennyKumar  Terrilee Dudzik T Talesha Ellithorpe, MD 09/05/14 1105
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
## 2014-09-05 NOTE — Tx Team (Signed)
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   D/C  COPD exacerbation 09/05/2014   D/C                                             DISCHARGE CRITERIA:  Ability to meet basic life and health needs Improved stabilization in mood, thinking, and/or behavior Motivation to continue treatment in a less acute level of care Safe-care adequate arrangements made Verbal commitment to aftercare and medication compliance  PRELIMINARY DISCHARGE PLAN: Attend aftercare/continuing care group Outpatient therapy Return to previous living arrangement  PATIENT/FAMIILY INVOLVEMENT: This treatment plan has been presented to and reviewed with the patient, [name] [name], and/or family member,.  The patient and family have been given the opportunity to ask questions and make suggestions.  [name] [name], [name] [name] 09/05/2014, 2:13 PM

## 2014-09-05 NOTE — Progress Notes (Signed)
The focus of this group is to help patients review their daily goal of treatment and discuss progress on daily workbooks. Pt attended the last five minutes of the evening group and responded to discussion prompts from the Writer. Pt presented as hyperverbal, telling the group he was about to become a billionaire and was also a genius. "I'm smarter than everyone in this room plus Einstein. I could outhink you all!" Pt requested several times that someone from Nursing Staff give him a shave, but was told that we do not do this here. Pt said that upon discharge he planned to take care of himself by not smoking, eating a healthy diet and listening to Jesus.

## 2014-09-05 NOTE — BH Assessment (Signed)
Spoke to M[facility]ula at [facility]. Information has been received but has not been reviewed yet.  Harlin RainFord Ellis Ria CommentWarrick Jr, LPC, [facility] Triage Specialist [phone]

## 2014-09-05 NOTE — Progress Notes (Signed)
Patient ID: [name] [name], male   DOB: 06/20/1946, 68 y.o.   MRN: [id number]  PER STATE REGULATIONS 482.30  THIS CHART WAS REVIEWED FOR MEDICAL NECESSITY WITH RESPECT TO THE PATIENT'S ADMISSION/DURATION OF STAY.  NEXT REVIEW DATE: 09/09/14  Loura HaltBARBARA Adi Seales, RN, BSN CASE MANAGER

## 2014-09-06 ENCOUNTER — Encounter (HOSPITAL_COMMUNITY): Payer: Self-pay | Admitting: Internal Medicine

## 2014-09-06 DIAGNOSIS — I350 Nonrheumatic aortic (valve) stenosis: Secondary | ICD-10-CM

## 2014-09-06 DIAGNOSIS — N400 Enlarged prostate without lower urinary tract symptoms: Secondary | ICD-10-CM | POA: Diagnosis present

## 2014-09-06 DIAGNOSIS — J449 Chronic obstructive pulmonary disease, unspecified: Secondary | ICD-10-CM | POA: Diagnosis present

## 2014-09-06 DIAGNOSIS — F315 Bipolar disorder, current episode depressed, severe, with psychotic features: Secondary | ICD-10-CM

## 2014-09-06 DIAGNOSIS — I2510 Atherosclerotic heart disease of native coronary artery without angina pectoris: Secondary | ICD-10-CM

## 2014-09-06 LAB — TSH: TSH: 0.357 u[IU]/mL (ref 0.350–4.500)

## 2014-09-06 MED ORDER — LORAZEPAM 0.5 MG PO TABS
0.5000 mg | ORAL_TABLET | Freq: Three times a day (TID) | ORAL | Status: DC
  Administered 2014-09-06 – 2014-09-07 (×3): 0.5 mg via ORAL
  Filled 2014-09-06 (×3): qty 1

## 2014-09-06 MED ORDER — GABAPENTIN 100 MG PO CAPS
100.0000 mg | ORAL_CAPSULE | Freq: Three times a day (TID) | ORAL | Status: DC
  Administered 2014-09-06 – 2014-09-08 (×6): 100 mg via ORAL
  Filled 2014-09-06 (×10): qty 1

## 2014-09-06 MED ORDER — LORAZEPAM 0.5 MG PO TABS
0.5000 mg | ORAL_TABLET | Freq: Four times a day (QID) | ORAL | Status: DC | PRN
  Administered 2014-09-06 – 2014-09-11 (×8): 0.5 mg via ORAL
  Filled 2014-09-06 (×8): qty 1

## 2014-09-06 MED ORDER — DIVALPROEX SODIUM ER 250 MG PO TB24
250.0000 mg | ORAL_TABLET | Freq: Two times a day (BID) | ORAL | Status: DC
  Administered 2014-09-06 – 2014-09-07 (×2): 250 mg via ORAL
  Filled 2014-09-06 (×6): qty 1

## 2014-09-06 MED ORDER — CLOTRIMAZOLE 1 % EX CREA
TOPICAL_CREAM | Freq: Two times a day (BID) | CUTANEOUS | Status: DC
  Administered 2014-09-06 – 2014-09-12 (×8): via TOPICAL
  Filled 2014-09-06: qty 15

## 2014-09-06 MED ORDER — TAMSULOSIN HCL 0.4 MG PO CAPS
0.4000 mg | ORAL_CAPSULE | Freq: Every day | ORAL | Status: DC
  Administered 2014-09-06 – 2014-09-11 (×6): 0.4 mg via ORAL
  Filled 2014-09-06 (×8): qty 1

## 2014-09-06 MED ORDER — MOMETASONE FURO-FORMOTEROL FUM 200-5 MCG/ACT IN AERO
2.0000 | INHALATION_SPRAY | Freq: Two times a day (BID) | RESPIRATORY_TRACT | Status: DC
  Administered 2014-09-06 – 2014-09-12 (×12): 2 via RESPIRATORY_TRACT
  Filled 2014-09-06 (×2): qty 8.8

## 2014-09-06 MED ORDER — FUROSEMIDE 40 MG PO TABS
40.0000 mg | ORAL_TABLET | Freq: Every day | ORAL | Status: DC
  Administered 2014-09-06 – 2014-09-09 (×3): 40 mg via ORAL
  Filled 2014-09-06 (×4): qty 1
  Filled 2014-09-06: qty 2
  Filled 2014-09-06 (×5): qty 1

## 2014-09-06 MED ORDER — ATORVASTATIN CALCIUM 40 MG PO TABS
40.0000 mg | ORAL_TABLET | Freq: Every day | ORAL | Status: DC
  Administered 2014-09-07 – 2014-09-11 (×4): 40 mg via ORAL
  Filled 2014-09-06 (×8): qty 1

## 2014-09-06 MED ORDER — IPRATROPIUM-ALBUTEROL 0.5-2.5 (3) MG/3ML IN SOLN
3.0000 mL | Freq: Three times a day (TID) | RESPIRATORY_TRACT | Status: DC
  Administered 2014-09-06 – 2014-09-12 (×16): 3 mL via RESPIRATORY_TRACT
  Filled 2014-09-06 (×29): qty 3

## 2014-09-06 MED ORDER — ASPIRIN EC 81 MG PO TBEC
81.0000 mg | DELAYED_RELEASE_TABLET | Freq: Every day | ORAL | Status: DC
  Administered 2014-09-06 – 2014-09-12 (×7): 81 mg via ORAL
  Filled 2014-09-06 (×11): qty 1

## 2014-09-06 MED ORDER — FERROUS SULFATE 325 (65 FE) MG PO TABS
325.0000 mg | ORAL_TABLET | Freq: Three times a day (TID) | ORAL | Status: DC
  Administered 2014-09-06 – 2014-09-12 (×18): 325 mg via ORAL
  Filled 2014-09-06 (×26): qty 1

## 2014-09-06 NOTE — [facility] Group Notes (Signed)
[facility] LCSW Group Therapy   Feelings Around Diagnosis 1:15 - 2:30 PM 09/06/2014 2:36 PM  Type of Therapy:  Group Therapy  Participation Level:  Did Not Attend  Wynn BankerHodnett, Sanah Kraska Hairston 09/06/2014, 2:36 PM

## 2014-09-06 NOTE — Progress Notes (Signed)
D: Patient denies SI/HI and A/V hallucinations  A: Monitored q 15 minutes; patient encouraged to attend groups; patient educated about medications; patient given medications per physician orders; patient encouraged to express feelings and/or concerns  R: Patient is grandiose and tangential; patient is entitled and can be redirected at this time; patient is hyperverbal
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
## 2014-09-06 NOTE — Progress Notes (Addendum)
The focus of this group is to help patients review their daily goal of treatment and discuss progress on daily workbooks. Pt attended the evening group but responded off-topic to discussion prompts from the Writer. He presented with disorganized thoughts and rambled across a range of topics. [name] [name] was intrusive to his peers when they spoke, often interrupting or talking overtop them. He did respond to redirection from the Writer, but required it frequently. Pt had pressured speech and appeared energetic.
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
## 2014-09-06 NOTE — H&P (Signed)
Psychiatric Admission Assessment Adult  Patient Identification:  [name] [name] Date of Evaluation:  09/06/2014 Chief Complaint:  "I am not Bipolar. I am a billionaire."  History of Present Illness::   [name] [name] is a 68 year old male who presented to the [facility] via [facility] after calling them to report shortness of breath. He did this after his truck ran out of gas. It was apparent upon assessments by staff that the patient was very delusional and grandiose. He showed poor insight into his illness and became very combative on several occasions while at [facility]. The patient insisted that he was Ted Nugent and that he recently became a billionaire. The patient also reported having major heart surgery in September at [facility] where he had his aortic valve replaced. Today during his psychiatric assessment the patient reports "I ran out of gas. Flagged down a truck. I started hurting in my chest. I called EMS. I really need to leave. One of my friends was in a car accident and a deer put his hoof down his throat. He is dying at [facility]. I have a ton of money. I can take you all out to dinner. I just bought a building for five million dollars. I've died twice before. God told me to live my life any way I want. 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 I am so energetic. I could run 24/7 for days. I am not Bipolar. I was evaluated somewhere else and everyone was cool with me. I'm just doing fantastic. I just need to see my friend. I would never kill myself. I am just too beautiful. God will send me another woman one day." [name] [name] was extremely manic during the assessment with grandiose delusions, racing thoughts, increased energy level, pressured speech and labile mood. The patient was very cooperative until was informed that he could not be discharged. He then turned towards the wall crying stating "You know this is not right. I will not see my dying friend." Patient demanded that  the MD call [facility] to confirm his story. He was later overheard being loud and disruptive on the unit. The patient also made several sexually suggestive comments to male staff during the assessment. His Seroquel was discontinued in light of recent heart surgery and currently prolonged QT interval upon review of recent EKG.  Elements:  Location:  Adult unit . Quality:  Acute mania with psychosis . Severity:  Severe . Timing:  Over the last week . Duration:  Prior history of Bipolar Disorder . Context:  Delusions, Agitation, poor insight into illness . Associated Signs/Synptoms: Depression Symptoms:  Denies (Hypo) Manic Symptoms:  Delusions, Distractibility, Elevated Mood, Flight of Ideas, Community education officer, Grandiosity, Impulsivity, Irritable Mood, Labiality of Mood, Sexually Inapproprite Behavior, Anxiety Symptoms:  Denies Psychotic Symptoms:  Delusions, PTSD Symptoms: Negative Total Time spent with patient: 1 hour  Psychiatric Specialty Exam: Physical Exam  Constitutional:  Complete physical exam completed in the [facility] and I concur with no noted exceptions.     Review of Systems  Constitutional: Negative for fever, chills, weight loss, malaise/fatigue and diaphoresis.  HENT: Negative for congestion, ear discharge, ear pain, hearing loss, nosebleeds, sore throat and tinnitus.   Eyes: Negative for blurred vision, double vision, photophobia, pain, discharge and redness.  Respiratory: Negative for cough, hemoptysis, sputum production, shortness of breath, wheezing and stridor.   Cardiovascular: Negative for chest pain, palpitations, orthopnea, claudication, leg swelling and PND.  Gastrointestinal: Negative for  heartburn, nausea, vomiting, abdominal pain, diarrhea, constipation, blood in stool and melena.  Genitourinary: Negative for dysuria, urgency, frequency, hematuria and flank pain.  Musculoskeletal: Negative for myalgias, back pain, joint pain, falls and neck  pain.  Skin: Negative for itching and rash.  Neurological: Negative for dizziness, tingling, tremors, sensory change, speech change, focal weakness, seizures, loss of consciousness, weakness and headaches.  Endo/Heme/Allergies: Negative for environmental allergies and polydipsia. Does not bruise/bleed easily.  Psychiatric/Behavioral: The patient is nervous/anxious and has insomnia.     Blood pressure 108/69, pulse 88, temperature 98.3 F (36.8 C), temperature source Oral, resp. rate 20, height 2' 2.97" (0.685 m), weight 72.802 kg (160 lb 8 oz).Body mass index is 155.15 kg/(m^2).  General Appearance: Casual  Eye Contact::  Intense  Speech:  Pressured  Volume:  Increased  Mood:  Euphoric  Affect:  Labile  Thought Process:  Illogical   Orientation:  Other:  Alert to person on time, thinks he is at [facility] in [place]   Thought Content:  Delusions, Obsessions and Rumination  Suicidal Thoughts:  No  Homicidal Thoughts:  No  Memory:  Immediate;   Good Recent;   Fair Remote;   Fair  Judgement:  Impaired  Insight:  Lacking  Psychomotor Activity:  Increased and Restlessness  Concentration:  Fair  Recall:  [employer] [place]: Good  Akathisia:  No  Handed:  Right  AIMS (if indicated):     Assets:  Communication Skills Leisure Time Resilience  Sleep:  Number of Hours: 3.5   Musculoskeletal: Strength & Muscle Tone: within normal limits Gait & Station: normal Patient leans: N/A  Past Psychiatric History:Yes Diagnosis: Bipolar Disorder   Hospitalizations: [facility] in [place]: Denies  Substance Abuse Care: Denies  Self-Mutilation:Denies  Suicidal Attempts:Denies   Violent Behaviors: Yes has been combative while in the hospital requiring IM injections    Past Medical History:   Past Medical History  Diagnosis Date  . 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 COPD (chronic obstructive  pulmonary disease)   . Thoracic ascending aortic aneurysm     repaired 06/2014 at [facility]  . COPD (chronic obstructive pulmonary disease)   . BPH (benign prostatic hyperplasia)   . Pacemaker     battery replaced 06/2014 at [facility]   Cardiac History:  Ascending thoracic aneurysm repair 06/2014 at [facility], Severe aortic valve stenosis  Allergies:  No Known Allergies PTA Medications: Prescriptions prior to admission  Medication Sig Dispense Refill Last Dose  . aspirin 81 MG tablet Take 81 mg by mouth daily.     Marland Kitchen atorvastatin (LIPITOR) 40 MG tablet Take 40 mg by mouth daily.     . ferrous sulfate 325 (65 FE) MG EC tablet Take 325 mg by mouth 3 (three) times daily with meals.     . Fluticasone-Salmeterol (ADVAIR) 500-50 MCG/DOSE AEPB Inhale 1 puff into the lungs 2 (two) times daily.     . furosemide (LASIX) 40 MG tablet Take 40 mg by mouth daily.     Marland Kitchen gabapentin (NEURONTIN) 100 MG capsule Take 100 mg by mouth 3 (three) times daily.     . indomethacin (INDOCIN) 25 MG capsule Take 25 mg by mouth 2 (two) times daily with a meal.     . oxycodone (OXY-IR) 5 MG capsule Take 5 mg by mouth every 4 (four) hours as needed for pain.     . polyethylene  glycol (MIRALAX / GLYCOLAX) packet Take 17 g by mouth daily as needed for mild constipation or moderate constipation.     . tamsulosin (FLOMAX) 0.4 MG CAPS capsule Take 0.4 mg by mouth daily after supper.     Marland Kitchen albuterol (VENTOLIN HFA) 108 (90 BASE) MCG/ACT inhaler Inhale 2 puffs into the lungs every 6 (six) hours as needed for wheezing or shortness of breath.   Past Month at Unknown time  . ipratropium-albuterol (DUONEB) 0.5-2.5 (3) MG/3ML SOLN Take 3 mLs by nebulization every 4 (four) hours as needed.    Past Month at Unknown time    Previous Psychotropic Medications:  Medication/Dose  Lithium                Substance Abuse History in the last 12 months:  No.  Consequences of Substance Abuse: NA  Social History:  reports that he has quit smoking. He  does not have any smokeless tobacco history on file. He reports that he drinks alcohol. He reports that he uses illicit drugs (Marijuana). Additional Social History: History of alcohol / drug use?: No history of alcohol / drug abuse                    Current Place of Residence:   Place of Birth:   Family Members: Marital Status:  Divorced Children:2  Sons:2  Daughters: Relationships: Education:  HS Soil scientist Problems/Performance: Religious Beliefs/Practices: History of Abuse (Emotional/Phsycial/Sexual) Occupational Experiences; Nature conservation officer History:  Dispensing optician History: Hobbies/Interests:  Family History:   Family History  Problem Relation Age of Onset  . CAD Father   . Diabetes Father   . High blood pressure Brother   . Cancer Brother     Results for orders placed or performed during the hospital encounter of 09/01/14 (from the past 72 hour(s))  CBG monitoring, ED     Status: Abnormal   Collection Time: 09/03/14  6:33 PM  Result Value Ref Range   Glucose-Capillary 147 (H) 70 - 99 mg/dL  CBG monitoring, ED     Status: Abnormal   Collection Time: 09/03/14 10:52 PM  Result Value Ref Range   Glucose-Capillary 167 (H) 70 - 99 mg/dL  CBG monitoring, ED     Status: Abnormal   Collection Time: 09/04/14  9:08 AM  Result Value Ref Range   Glucose-Capillary 158 (H) 70 - 99 mg/dL  CBG monitoring, ED     Status: Abnormal   Collection Time: 09/04/14  6:06 PM  Result Value Ref Range   Glucose-Capillary 113 (H) 70 - 99 mg/dL  Pro b natriuretic peptide     Status: Abnormal   Collection Time: 09/04/14  9:21 PM  Result Value Ref Range   Pro B Natriuretic peptide (BNP) 333.0 (H) 0 - 125 pg/mL  CBC with Differential     Status: Abnormal   Collection Time: 09/04/14  9:21 PM  Result Value Ref Range   WBC 8.7 4.0 - 10.5 K/uL   RBC 4.19 (L) 4.22 - 5.81 MIL/uL   Hemoglobin 12.5 (L) 13.0 - 17.0 g/dL   HCT 39.0 39.0 - 52.0 %   MCV 93.1 78.0 - 100.0 fL   MCH 29.8 26.0  - 34.0 pg   MCHC 32.1 30.0 - 36.0 g/dL   RDW 15.7 (H) 11.5 - 15.5 %   Platelets 230 150 - 400 K/uL   Neutrophils Relative % 74 43 - 77 %   Neutro Abs 6.5 1.7 - 7.7 K/uL   Lymphocytes Relative 16  12 - 46 %   Lymphs Abs 1.4 0.7 - 4.0 K/uL   Monocytes Relative 10 3 - 12 %   Monocytes Absolute 0.9 0.1 - 1.0 K/uL   Eosinophils Relative 0 0 - 5 %   Eosinophils Absolute 0.0 0.0 - 0.7 K/uL   Basophils Relative 0 0 - 1 %   Basophils Absolute 0.0 0.0 - 0.1 K/uL   Psychological Evaluations:  Assessment:   DSM5:  AXIS I:  Bipolar 1 Disorder, most recent episode manic with psychotic features  AXIS II:  Deferred AXIS III:   Past Medical History  Diagnosis Date  . Severe aortic valve stenosis     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 at [facility]  . COPD (chronic obstructive pulmonary disease)   . Thoracic ascending aortic aneurysm     repaired 06/2014 at [facility]  . COPD (chronic obstructive pulmonary disease)   . BPH (benign prostatic hyperplasia)   . Pacemaker     battery replaced 06/2014 at [place] IV:  economic problems, housing problems, occupational problems, other psychosocial or environmental problems and problems with primary support group AXIS V:  21-30 behavior considerably influenced by delusions or hallucinations OR serious impairment in judgment, communication OR inability to function in almost all areas  Treatment Plan/Recommendations:   1. Admit for crisis management and stabilization. Estimated length of stay 5-7 days. 2. Medication management to reduce current symptoms to base line and improve the patient's level of functioning.  3. Develop treatment plan to decrease risk of relapse upon discharge of manic symptoms and the need for readmission. 5. Group therapy to facilitate development of healthy coping skills to use for mania and delusional thought processes.  6. Health care follow up as needed for medical problems.  7. Discharge plan to include therapy to help patient cope  with  stressors.  8. Call for Consult with Hospitalist for additional specialty patient services as needed.   Treatment Plan Summary: Daily contact with patient to assess and evaluate symptoms and progress in treatment Medication management Current Medications:  Current Facility-Administered Medications  Medication Dose Route Frequency Provider Last Rate Last Dose  . acetaminophen (TYLENOL) tablet 650 mg  650 mg Oral Q6H PRN Janett Labella, NP   650 mg at 09/06/14 0554  . alum & mag hydroxide-simeth (MAALOX/MYLANTA) 200-200-20 MG/5ML suspension 30 mL  30 mL Oral Q4H PRN Janett Labella, NP      . aspirin EC tablet 81 mg  81 mg Oral Daily Janece Canterbury, MD   81 mg at 09/06/14 1308  . atorvastatin (LIPITOR) tablet 40 mg  40 mg Oral q1800 Janece Canterbury, MD      . divalproex (DEPAKOTE ER) 24 hr tablet 250 mg  250 mg Oral BID Myer Peer Tad Fancher, MD      . ferrous sulfate tablet 325 mg  325 mg Oral TID WC Janece Canterbury, MD   325 mg at 09/06/14 1308  . furosemide (LASIX) tablet 40 mg  40 mg Oral Daily Janece Canterbury, MD   40 mg at 09/06/14 1309  . gabapentin (NEURONTIN) capsule 100 mg  100 mg Oral TID Janece Canterbury, MD   100 mg at 09/06/14 1308  . ipratropium-albuterol (DUONEB) 0.5-2.5 (3) MG/3ML nebulizer solution 3 mL  3 mL Nebulization TID Janece Canterbury, MD      . LORazepam (ATIVAN) tablet 0.5 mg  0.5 mg Oral TID Jenne Campus, MD   0.5 mg at 09/06/14 1309  . magnesium hydroxide (MILK OF  MAGNESIA) suspension 30 mL  30 mL Oral Daily PRN Freda Munro May Agustin, NP      . mometasone-formoterol [facility]) 200-5 MCG/ACT inhaler 2 puff  2 puff Inhalation BID Janece Canterbury, MD      . tamsulosin (FLOMAX) capsule 0.4 mg  0.4 mg Oral QPC supper Janece Canterbury, MD      . traZODone (DESYREL) tablet 50 mg  50 mg Oral QHS,MR X 1 Spencer E Simon, PA-C   50 mg at 09/06/14 0041    Observation Level/Precautions:  Elopement 15 minute checks  Laboratory:  CBC Chemistry Profile UDS   Psychotherapy:  Individual and Group Therapy  Medications:  Ativan 0.5 mg TID for anxiety, Depakote ER 250 mg BID for improved mood control, Neurontin 100 mg TID for agitation   Consultations:  Internal Medicine for medical issues  Discharge Concerns:  Emotional stability   Estimated LOS: 5-7 days  Other:  Increase collateral information    I certify that inpatient services furnished can reasonably be expected to improve the patient's condition.   Elmarie Shiley NP-C 11/10/20151:27 PM   Patient Case Discussed and Reviewed with NP as above- agree with NP's Note, Assessment , Plan. I have also met with patient. Patient is a 6 year old man who present with quite severe mania . He is very grandiose, and makes statements about being a billionaire, owning large tracks of land, stating he will lose a 5 million dollar deal if he is not discharged immediately. He is also hypersexual, frequently attempting to flirt with male staff and making inappropriate sexual remarks often. He is pressured in speech, fluctuates between expansiveness and irritability, and his insight is limited. He is a poor historian and denies psychiatric history, although does remember having been tried on lithium in the past.  He has a history of relatively recent cardiac surgery - valve replacement and pacemaker placement. His EKG confirms pacemaker and also QTc is 507.  Plan- will avoid using antipsychotics due to above. Will start Depakote ER and Ativan to address mania. Staff to attempt to reach source of collateral information- patient not forthcoming and does not currently endorse or provide consent to contact anyone close to him.

## 2014-09-06 NOTE — Tx Team (Addendum)
Interdisciplinary Treatment Plan Update   Date Reviewed:  09/06/2014  Time Reviewed:  8:37 AM  Progress in Treatment:   Attending groups: Yes Participating in groups: Yes Taking medication as prescribed: Yes  Tolerating medication: Yes Family/Significant other contact made:  No, but will ask patient for consent for collateral contact Patient understands diagnosis: Yes  Discussing patient identified problems/goals with staff: Yes Medical problems stabilized or resolved: Yes Denies suicidal/homicidal ideation: Yes Patient has not harmed self or others: Yes  For review of initial/current patient goals, please see plan of care.  Estimated Length of Stay:  3-5 days  Reasons for Continued Hospitalization:  Anxiety Depression Medication stabilization Psychosis   New Problems/Goals identified:    Discharge Plan or Barriers:   Home with outpatient follow up to be determined  Additional Comments:  Evaluated 68 Years old caucasian male who [facility]. Patient was seen via tele Psychiatry. Patient is alert, oriented x 3. He answered most question about current affairs correctly but then gave some delusional answers. He states he has been living in a motel for three months and before this [place] He stated that he is buying a huge property in [place] to put up a building. He stated that he has been friends with the musician Karene Fryed Nuggent and they see each other weekly. He states that he was Psychiatrically evaluate at [facility] early this year and was found that nothing was wrong with him. He denies any mental illness diagnosis or taking any Psychotropic medications. Patient denies alcohol or illicit drug use. He reports good sleep and appetite. He live alone in motels and got in trouble for smoking there. He denies SI/HI/AVH. He has family members in [place] but no phone numbers available to contact them We will admit patient at any Geropsychiatric  facility for stabilization.   Patient and CSW will meet to  identify goals and treatment plan.    Attendees:  Patient:  09/06/2014 8:37 AM   Signature:  Sallyanne HaversF. Cobos, MD 09/06/2014 8:37 AM  Signature: Geoffery LyonsIrving Lugo, MD 09/06/2014 8:37 AM  Signature:  Roswell Minersonna Shimp, RN 09/06/2014 8:37 AM  Signature:   09/06/2014 8:37 AM  Signature:   09/06/2014 8:37 AM  Signature:  Juline PatchQuylle Melayah Skorupski, LCSW 09/06/2014 8:37 AM  Signature:  Belenda CruiseKristin Drinkard, LCSW-A 09/06/2014 8:37 AM  Signature:  Leisa LenzValerie Enoch, Care Coordinator [facility] 09/06/2014 8:37 AM  Signature: Christie BeckersBrittney Guthrie, RN  09/06/2014 8:37 AM  Signature:  09/06/2014  8:37 AM  Signature:   Onnie BoerJennifer Clark, RN [facility] 09/06/2014  8:37 AM  Signature:   09/06/2014  8:37 AM    Scribe for Treatment Team:   Juline PatchQuylle Onita Pfluger,  09/06/2014 8:37 AM

## 2014-09-06 NOTE — Progress Notes (Signed)
D   Pt is flirtatious and tried to interact inappropriately with male staff   He told a staff member that she was his soul mate   He is grandiose and disorganized rambling on about a lot of different unrelated topics   Pt needs a lot of redirection but does respond fairly well to redirection    He is somewhat confused about his medications saying he needed a bedtime breathing treatment which he had just had A   Verbal support and encouragement given     Medications administered and effectiveness monitored   Q 15 min checks  Redirection as appropriate R   Pt safe at present

## 2014-09-07 DIAGNOSIS — F319 Bipolar disorder, unspecified: Secondary | ICD-10-CM

## 2014-09-07 MED ORDER — DIVALPROEX SODIUM ER 500 MG PO TB24
500.0000 mg | ORAL_TABLET | Freq: Every day | ORAL | Status: DC
  Administered 2014-09-07: 500 mg via ORAL
  Filled 2014-09-07 (×2): qty 1

## 2014-09-07 MED ORDER — LORAZEPAM 1 MG PO TABS
1.0000 mg | ORAL_TABLET | Freq: Two times a day (BID) | ORAL | Status: DC
  Administered 2014-09-07 – 2014-09-11 (×9): 1 mg via ORAL
  Filled 2014-09-07 (×10): qty 1

## 2014-09-07 MED ORDER — DIVALPROEX SODIUM ER 250 MG PO TB24
250.0000 mg | ORAL_TABLET | Freq: Every morning | ORAL | Status: DC
  Filled 2014-09-07 (×2): qty 1

## 2014-09-07 NOTE — Progress Notes (Signed)
Did not attend group 

## 2014-09-07 NOTE — Progress Notes (Addendum)
Patient ID: [name] [name], male   DOB: 09/19/1946, 68 y.o.   MRN: [id number]   D: Pt currently presents with an animated affect and irritable, labile behavior. Pt exhibits delusional and gradniose thinking. Pt states "I'm losing money being here, 2,000 dollars a day, I could be on a plane with Jimmy on the way to the race. I'm friends with all those Nascar guys." Pt also states that he has "grown 5 inches since my heart replacement." Per self inventory, pt rates depression at a 0, hopelessness 0 and anxiety 0. Pt's daily goal is to "get truck/get out of class/go home" and they intend to do so by "let me go/have a happy day. "Boz"." Pt reports great sleep, Pt is verbally aggressive towards staff saying "Fuck you" and sexual towards male staff members. Pt is trying to elope off the unit to go see "his friend on the other hall." who is a male pt. Pt has been calling numerous people today on the phone and called the police at one point.    A: Pt provided with medications per providers orders. Pt's labs and vitals were monitored throughout the day. Pt supported emotionally and encouraged to express concerns and questions. Pt consulted with social worker about treatment plan. Writer spoke with physician about increasing anxiety and grandiose thinking. Pt's were medications modified.    R: Pt's safety ensured with 15 minute and environmental checks. Pt currently denies SI/HI and A/V hallucinations. Pt verbally agrees to seek staff if SI/HI or A/VH occurs and to consult with staff before acting on these thoughts. [facility] and pt verbalized understanding of unit phone rules and expressed his apologies. Pt remains free from harm and has been calmer during the afternoon. Pt wishes to change his inhaler orders to include more frequent times.    Aurora Maskwyman, Marchele Decock E, RN

## 2014-09-07 NOTE — [facility] Suicide Risk Assessment (Signed)
[facility] INPATIENT:  Family/Significant Other Suicide Prevention Education  Suicide Prevention Education:  Education Completed; No one has been identified by the patient as the family member/significant other with whom the patient will be residing, and identified as the person(s) who will aid the patient in the event of a mental health crisis (suicidal ideations/suicide attempt).  With written consent from the patient, the family member/significant other has been provided the following suicide prevention education, prior to the and/or following the discharge of the patient.  The suicide prevention education provided includes the following:  Suicide risk factors  Suicide prevention and interventions  National Suicide Hotline telephone number  [facility] assessment telephone number  [facility] Emergency Assistance 911  [facility] and/or Residential Mobile Crisis Unit telephone number  Request made of family/significant other to:  Remove weapons (e.g., guns, rifles, knives), all items previously/currently identified as safety concern.    Remove drugs/medications (over-the-counter, prescriptions, illicit drugs), all items previously/currently identified as a safety concern.  The family member/significant other verbalizes understanding of the suicide prevention education information provided.  The family member/significant other agrees to remove the items of safety concern listed above. The patient did not endorse SI at the time of admission, nor did the patient c/o SI during the stay here.  SPE not required.   Daryel Geraldorth, Tracey Stewart B 09/07/2014, 12:07 PM

## 2014-09-07 NOTE — [facility] Group Notes (Signed)
[facility]   09/07/2014 12:55 PM  Participation Quality:  Engaged  Mood/Affect:  Euphoric  Depression Rating:  denies  Anxiety Rating:  denies  Thoughts of Suicide:  No Will you contract for safety?   NA  Current AVH:  No  Plan for Discharge/Comments:  "I am planning on leaving here today. I have been here five days now.  I will call the governor and expose you as frauds if you do not release me today.  I lose five thousand dollars every day that you keep me in here."   Presents with pressured speech.  Is grandiose, delusional with limited insight.  Transportation Means:   Supports: none  [name] [name], [name] [name]

## 2014-09-07 NOTE — [facility] Group Notes (Signed)
[facility] LCSW Group Therapy  09/07/2014 1:36 PM  Type of Therapy:  Group Therapy  Participation Level: Active  Participation Quality:  Redirectable  Affect:  Labile  Cognitive:  Alert  Insight:  Limited  Engagement in Therapy:  Limited  Modes of Intervention:  Discussion, Education, Socialization and Support  Summary of Progress/Problems: [employer] (MHA) speaker came to talk about his personal journey with substance abuse and mental illness. Group members were challenged to process ways by which to relate to the speaker. MHA speaker provided handouts and educational information pertaining to groups and services offered by the [facility]. [name] [name] arrived at the end of group. Upon arrival he talked about the cost of staying here and how he is being held here illegally.     [name] [name],[name] [name] 09/07/2014, 1:36 PM

## 2014-09-07 NOTE — Progress Notes (Signed)
Patient ID: [name] [name], male   DOB: 06/08/1946, 68 y.o.   MRN: [id number]  Please note that Dr. Malachi BondsShort signed off 09/06/2014. Pt is clinically stable, BP is slightly low this AM with SBP in 80's.    CAD, AVR and aneurysm repair - chest pain free -Continue ASA, statin. Not on BB possibly due to low BPs and paced rhythm, would avoid adding until BP stabilizes  - If chest pain occurs, would contact [facility] [name] and cardiothoracic surgery [name] for transfer to their institution   Likely has chronic diastolic heart failure responsible for volume overload.  - Post-op he was on lasix 40mg  BID, spironolactone 12.5mg  BID, this was reduced to lasix 40mg  QD, no spirono later.  -Low salt diet -Resumed lasix 40 mg PO Qd, consider lowering dose to 20 mg PO QD if SBP persistently < 90 -Daily weights -If he gains more than 3-lbs in 1 day or 5-lbs in 7 days, increase lasix to 40mg  BID -Weekly BMP to check potassium and creatinine  Call me if you have further questions. Thank you.  Debbora PrestoMAGICK-Menaal Russum, MD  [facility] Pager [phone] Cell [phone]  If 7PM-7AM, please contact night-coverage [email or web address] Password [email or web address]

## 2014-09-07 NOTE — Progress Notes (Addendum)
D Pt. Denies SI and HI, no complaints of pain or discomfort noted.  Writer was in report and pt. Started banging on the door, tech left report to talk with the pt.  To calm him, pt. Then took a short nap  A Writer offered support and encouragement,  Attempted to speak with the pt.  When he woke from his nap and pt. Started sticking his middle finger up at staff and cursing because he states he had been lied to.  Writer reminded pt. That she had just arrived on the unit and this was our first meeting.  R Pt. Remains safe on the unit.  Pt. Did calm down and apologized for his behavior.  Pt. Is presently resting quietly.     Pt. Has slept for appr. 2.5 hrs in intervals tonight.  Pt. Has been loud waking multiple patients on the unit.  Pt. Is demanding, has been making derogatory, insulting, racial remarks to staff all night.  Demanding his breathing treatment which is scheduled TID, Pt.'s 02 stats were 95%.  Resp. Even unlabored, pt. Was talking nonstop.  Pt. Went from one demand to another all night.  Started pushing the laundry and shaking it, cursing because the phone was off, he was trying to call the police. Pt. Was agitating his peers and at times we had to intervene and deescalate his peers due to his provoking remarks.

## 2014-09-07 NOTE — Plan of Care (Signed)
Problem: Ineffective individual coping Goal: STG: Patient will remain free from self harm Outcome: Progressing     

## 2014-09-07 NOTE — Plan of Care (Signed)
Problem: Alteration in thought process Goal: LTG-Patient has not harmed self or others in at least 2 days Outcome: Progressing     

## 2014-09-07 NOTE — Plan of Care (Signed)
Problem: Alteration in thought process Goal: STG-Patient is able to follow short directions Outcome: Progressing

## 2014-09-07 NOTE — Progress Notes (Addendum)
[facility] MD Progress Note  09/07/2014 2:16 PM [name] [name]  MRN:  [id number] Subjective:   Patient states he is feeling " wonderful". He demands immediate release from unit. Objective:  I have discussed case with treatment team/ Nursing Staff , and have met with patient. As per staff, patient still intrusive, loud, irritable , and hypersexual. His sleep is poor. Patient's insight remains poor- he states he has business to conduct and needs to visit a dying friend, and demands immediate release loudly. Becomes irritable and vaguely verbally  threatening when this request not met.  Earlier today contacted police via phone, as per Nursing Staff, reporting he was being kidnapped in hospital , " so they can make money off me".   Has made  repeated threats to sue and states he is a personal friend of the governor of [place] and will have the hospital shut down. He is , however, less significantly pressured in speech than yesterday, and seemed better able to calm down and maintain behavioral control compared to admission. He denies having any history of mental illness. He did provide phone number of his ex wife [phone], and states he would provide consent to call for collateral information. He denies medication side effects. ( On Depakote and Ativan- we are avoiding use of antipsychotics due to cardiac history and elevated QTc)  Denies any Chest Pain or Shortness of Breath or Orthopnea, or PND Appreciate Hospitalist Consult. TSH on lower range of normal. Will obtain T3 and FT4    Diagnosis:  Bipolar Disorder Manic  Total Time spent with patient: 25 minutes    ADL's:fair  Sleep:poor   Appetite:  Fair   Suicidal Ideation:  Denies any suicidal ideations Homicidal Ideation:  Denies any homicidal ideations AEB (as evidenced by):  Psychiatric Specialty Exam: Physical Exam  Review of Systems  Constitutional: Negative for fever and chills.  Eyes: Negative.   Respiratory: Negative for  cough and shortness of breath.   Cardiovascular: Negative for chest pain, palpitations, orthopnea and PND.  Gastrointestinal: Negative for vomiting, abdominal pain and blood in stool.  Genitourinary: Negative for dysuria, urgency and frequency.  Musculoskeletal: Negative for myalgias and neck pain.  Skin: Negative for rash.  Neurological: Negative for headaches.    Blood pressure 87/62, pulse 90, temperature 97.6 F (36.4 C), temperature source Oral, resp. rate 20, height 2' 2.97" (0.685 m), weight 74.39 kg (164 lb).Body mass index is 158.54 kg/(m^2).  General Appearance: Fairly Groomed  Engineer, water::  Good  Speech:  Pressured  Volume:  variable, loud at times  Mood:  manic  Affect:  Labile and irritable  Thought Process:  Disorganized  Orientation:  Other:  alert, attentive  Thought Content:  grandiose, poor insight, denies hallucinations and does not appear internally preoccupied  Suicidal Thoughts:  No- denies any suicidal or homicidal ideations  Homicidal Thoughts:  No  Memory:  recent and remote fair  Judgement:  Impaired  Insight:  Lacking  Psychomotor Activity:  Increased  Concentration:  Fair  Recall:  [place] of Knowledge:Good  Language: Good  Akathisia:  Negative  Handed:  Right  AIMS (if indicated):     Assets:  Desire for Improvement Resilience  Sleep:  Number of Hours: 3.25   Musculoskeletal: Strength & Muscle Tone: within normal limits Gait & Station: normal Patient leans: N/A  Current Medications: Current Facility-Administered Medications  Medication Dose Route Frequency Provider Last Rate Last Dose  . acetaminophen (TYLENOL) tablet 650 mg  650  mg Oral Q6H PRN Janett Labella, NP   650 mg at 09/06/14 0554  . alum & mag hydroxide-simeth (MAALOX/MYLANTA) 200-200-20 MG/5ML suspension 30 mL  30 mL Oral Q4H PRN Janett Labella, NP      . aspirin EC tablet 81 mg  81 mg Oral Daily Janece Canterbury, MD   81 mg at 09/07/14 4010  . atorvastatin (LIPITOR)  tablet 40 mg  40 mg Oral q1800 Janece Canterbury, MD   40 mg at 09/06/14 1800  . clotrimazole (LOTRIMIN) 1 % cream   Topical BID Janece Canterbury, MD      . divalproex (DEPAKOTE ER) 24 hr tablet 250 mg  250 mg Oral BID Jenne Campus, MD   250 mg at 09/07/14 0826  . ferrous sulfate tablet 325 mg  325 mg Oral TID WC Janece Canterbury, MD   325 mg at 09/07/14 1158  . furosemide (LASIX) tablet 40 mg  40 mg Oral Daily Janece Canterbury, MD   40 mg at 09/07/14 2725  . gabapentin (NEURONTIN) capsule 100 mg  100 mg Oral TID Janece Canterbury, MD   100 mg at 09/07/14 1158  . ipratropium-albuterol (DUONEB) 0.5-2.5 (3) MG/3ML nebulizer solution 3 mL  3 mL Nebulization TID Janece Canterbury, MD   3 mL at 09/07/14 1158  . LORazepam (ATIVAN) tablet 0.5 mg  0.5 mg Oral Q6H PRN Jenne Campus, MD   0.5 mg at 09/07/14 0459  . LORazepam (ATIVAN) tablet 1 mg  1 mg Oral BID Jenne Campus, MD   1 mg at 09/07/14 1158  . magnesium hydroxide (MILK OF MAGNESIA) suspension 30 mL  30 mL Oral Daily PRN Freda Munro May Agustin, NP      . mometasone-formoterol [facility]) 200-5 MCG/ACT inhaler 2 puff  2 puff Inhalation BID Janece Canterbury, MD   2 puff at 09/07/14 [phone]  . tamsulosin (FLOMAX) capsule 0.4 mg  0.4 mg Oral QPC supper Janece Canterbury, MD   0.4 mg at 09/06/14 1722  . traZODone (DESYREL) tablet 50 mg  50 mg Oral QHS,MR X 1 Laverle Hobby, PA-C   50 mg at 09/06/14 2118    Lab Results:  Results for orders placed or performed during the hospital encounter of 09/05/14 (from the past 48 hour(s))  TSH     Status: None   Collection Time: 09/06/14  6:20 AM  Result Value Ref Range   TSH 0.357 0.350 - 4.500 uIU/mL    Comment: Performed at [facility]    Physical Findings: AIMS: Facial and Oral Movements Muscles of Facial Expression: None, normal Lips and Perioral Area: None, normal Jaw: None, normal Tongue: None, normal,Extremity Movements Upper (arms, wrists, hands, fingers): None, normal Lower (legs, knees, ankles,  toes): None, normal, Trunk Movements Neck, shoulders, hips: None, normal, Overall Severity Severity of abnormal movements (highest score from questions above): None, normal Incapacitation due to abnormal movements: None, normal Patient's awareness of abnormal movements (rate only patient's report): No Awareness, Dental Status Current problems with teeth and/or dentures?: No Does patient usually wear dentures?: No  CIWA:    COWS:     Assessment: Patient remains manic, intrusive, irritable, hypersexual, pressured in speech and loud at times. His insight is limited, and denies having anything wrong with him. He is, however, somewhat better than upon admission, as noted by a partially improved ability to de- escalate and less blatant hypersexuality, improved ability to respond to redirections from staff. So far has tolerated Ativan and Depakote ER well. He is  not being treated with antipsychotics due to  Cardiac history and QTC elevation . Treatment Plan Summary: Daily contact with patient to assess and evaluate symptoms and progress in treatment Medication management See below  Plan: Continue inpatient treatment, support, milieu. Increase Ativan to 1 mgr BID Increase Depakote ER to 250 mgrs QAM and 500 mgrs QHS Ativan 0.5 mgrs Q 6 hours PRN Agitation. T3 and FT4 will be ordered.  Staff to communicate with family member as above, pending written consent ( purpose is collateral information ). Staff to request medical records from [facility] ( where patient had cardiac intervention) for collateral information as well, if patient consents.  Medical Decision Making Problem Points:  Established problem, stable/improving (1), Review of last therapy session (1) and Review of psycho-social stressors (1) Data Points:  Review or order clinical lab tests (1) Review of medication regiment & side effects (2) Review of new medications or change in dosage (2)  I certify that inpatient services  furnished can reasonably be expected to improve the patient's condition.   Hajer Dwyer, [place] 09/07/2014, 2:16 PM

## 2014-09-08 DIAGNOSIS — F312 Bipolar disorder, current episode manic severe with psychotic features: Principal | ICD-10-CM

## 2014-09-08 LAB — T3: T3, Total: 100 ng/dl (ref 80.0–204.0)

## 2014-09-08 LAB — T4, FREE: Free T4: 1.28 ng/dL (ref 0.80–1.80)

## 2014-09-08 MED ORDER — DIVALPROEX SODIUM ER 500 MG PO TB24
1000.0000 mg | ORAL_TABLET | Freq: Every evening | ORAL | Status: DC
  Administered 2014-09-09 – 2014-09-11 (×4): 1000 mg via ORAL
  Filled 2014-09-08 (×5): qty 2

## 2014-09-08 MED ORDER — DIVALPROEX SODIUM ER 500 MG PO TB24
1000.0000 mg | ORAL_TABLET | Freq: Every day | ORAL | Status: DC
  Filled 2014-09-08: qty 2

## 2014-09-08 MED ORDER — GABAPENTIN 100 MG PO CAPS
200.0000 mg | ORAL_CAPSULE | Freq: Three times a day (TID) | ORAL | Status: DC
  Administered 2014-09-08 – 2014-09-12 (×11): 200 mg via ORAL
  Filled 2014-09-08 (×18): qty 2

## 2014-09-08 MED ORDER — DIVALPROEX SODIUM ER 500 MG PO TB24
750.0000 mg | ORAL_TABLET | Freq: Every day | ORAL | Status: DC
  Filled 2014-09-08: qty 1

## 2014-09-08 MED ORDER — DIVALPROEX SODIUM ER 500 MG PO TB24
750.0000 mg | ORAL_TABLET | Freq: Every evening | ORAL | Status: AC
  Filled 2014-09-08 (×3): qty 1

## 2014-09-08 MED ORDER — LURASIDONE HCL 40 MG PO TABS: 40.0000 mg | ORAL_TABLET | Freq: Every day | ORAL | Status: DC

## 2014-09-08 MED ORDER — TRAZODONE HCL 100 MG PO TABS
100.0000 mg | ORAL_TABLET | Freq: Every day | ORAL | Status: DC
  Administered 2014-09-08: 100 mg via ORAL
  Filled 2014-09-08 (×3): qty 1

## 2014-09-08 NOTE — Progress Notes (Addendum)
Patient continues to disrupt the milieu angering many if not most of his peers. He is loud, demanding and argumentative. Verbally aggressive and threatening. Cursing and making racist and degrading comments to staff and peers. He is restless and refuses to stay in his room. Remains grandiose telling this nurse, "I became a billionarie last Friday at 10:02am. My secretary texted me to tell me." Near constant redirect given. Firm but kind limits given. Due to patient's reluctance to return from breakfast this morning as well as shoving staff yesterday morning, MD was asked for order to restrict patient to unit. Ativan 0.5mg  po prn had to be admin at 1033 as patient was banging on unit doors and attempting to get to male peer on 300 hall for whom he has affection. Redirect frequently unsuccessful. Artivan did result in a short nap of 10" in the hallway as patient will not return to unit. Behavior unchanged upon his awakening. Denies pain or physical problems, though asks for nebs treatments frequently. Denies SI/HI/AVH and is safe at this time. Lawrence MarseillesFriedman, Manfred Laspina Eakes   Patient made mod fall risk due to age, meds and impulsiveness (score is an "8"). Reminded patient of fall precautions, signage in place however patient refuses yellow arm band. "Don't talk down to me. I'm smarter than you are!" Lawrence MarseillesFriedman, Aasiyah Auerbach Eakes   11:35am Patient continues to make suggestive comments to male patients. "Let's you and me go lie down together." Redirected with some success. Patient displays no insight into why this is inappropriate. Patient then found in male patient's room. Unit rules and expectations reinforced. Lawrence MarseillesFriedman, Shown Dissinger Eakes

## 2014-09-08 NOTE — Progress Notes (Signed)
Pt stated that he had a fantastic day. He plans to survive and escape the hospital soon. He also stated that he learned not to argue with the head RN, and that referring to Ry[place] (MHT).

## 2014-09-08 NOTE — Progress Notes (Signed)
[facility] MD Progress Note  09/08/2014 3:04 PM [name] [name]  MRN:  [id number] Subjective:   Patient states "I want to be out of here today." Objective:  I have discussed case with treatment team/ Nursing Staff , and have met with patient. As per staff, patient still intrusive, loud, irritable , and hypersexual.  Pt seen in the hallway ,very demanding ,loud with pressured speech ,manic. Pt required prn medications since he was not redirectable.  Patient's insight remains poor- he states he has business to conduct and needs to visit a dying friend, and demands immediate release loudly. Becomes irritable and vaguely verbally  threatening when this request not met.   He reports feeling very groggy this AM due to "some medication you gave me last night". ( On Depakote and Ativan- we are avoiding use of antipsychotics due to cardiac history and elevated QTc)  Denies any Chest Pain or Shortness of Breath or Orthopnea, or PND Appreciate Hospitalist Consult. TSH on lower range of normal. Obtained T3 and FT4- wnl.    Diagnosis:  Bipolar Disorder type ,recurrent ,severe ,Manic with psychotic features.  Total Time spent with patient: 30 minutes    ADL's:fair  Sleep:poor   Appetite:  Fair   Psychiatric Specialty Exam: Physical Exam  Review of Systems  Constitutional: Negative for fever and chills.  Eyes: Negative.   Respiratory: Negative for cough and shortness of breath.   Cardiovascular: Negative for chest pain, palpitations, orthopnea and PND.  Gastrointestinal: Negative for vomiting, abdominal pain and blood in stool.  Genitourinary: Negative for dysuria, urgency and frequency.  Musculoskeletal: Negative for myalgias and neck pain.  Skin: Negative for rash.  Neurological: Negative for headaches.    Blood pressure 84/52, pulse 86, temperature 98.5 F (36.9 C), temperature source Oral, resp. rate 20, height 2' 2.97" (0.685 m), weight 73.029 kg (161 lb).Body mass index is 155.64  kg/(m^2).  General Appearance: Fairly Groomed  Engineer, water::  Good  Speech:  Pressured  Volume:  variable, loud at times  Mood:  manic  Affect:  Labile and irritable  Thought Process:  Disorganized  Orientation:  Other:  alert, attentive  Thought Content:  grandiose, poor insight, denies hallucinations and does not appear internally preoccupied  Suicidal Thoughts:  No- denies any suicidal or homicidal ideations  Homicidal Thoughts:  No  Memory:  recent and remote fair  Judgement:  Impaired  Insight:  Lacking  Psychomotor Activity:  Increased  Concentration:  Fair  Recall:  [place] of Knowledge:Good  Language: Good  Akathisia:  Negative  Handed:  Right  AIMS (if indicated):     Assets:  Desire for Improvement Resilience  Sleep:  Number of Hours: 2.5   Musculoskeletal: Strength & Muscle Tone: within normal limits Gait & Station: normal Patient leans: N/A  Current Medications: Current Facility-Administered Medications  Medication Dose Route Frequency Provider Last Rate Last Dose  . acetaminophen (TYLENOL) tablet 650 mg  650 mg Oral Q6H PRN Janett Labella, NP   650 mg at 09/08/14 9147  . alum & mag hydroxide-simeth (MAALOX/MYLANTA) 200-200-20 MG/5ML suspension 30 mL  30 mL Oral Q4H PRN Janett Labella, NP   30 mL at 09/07/14 1823  . aspirin EC tablet 81 mg  81 mg Oral Daily Janece Canterbury, MD   81 mg at 09/08/14 0800  . atorvastatin (LIPITOR) tablet 40 mg  40 mg Oral q1800 Janece Canterbury, MD   40 mg at 09/07/14 1700  . clotrimazole (LOTRIMIN) 1 % cream  Topical BID Janece Canterbury, MD      . Derrill Memo ON 09/09/2014] divalproex (DEPAKOTE ER) 24 hr tablet 1,000 mg  1,000 mg Oral QPM Braxxton Stoudt, MD      . divalproex (DEPAKOTE ER) 24 hr tablet 750 mg  750 mg Oral QPM Eder Macek, MD      . ferrous sulfate tablet 325 mg  325 mg Oral TID WC Janece Canterbury, MD   325 mg at 09/08/14 1144  . furosemide (LASIX) tablet 40 mg  40 mg Oral Daily Janece Canterbury, MD   40 mg  at 09/07/14 2836  . gabapentin (NEURONTIN) capsule 200 mg  200 mg Oral TID Ursula Alert, MD   200 mg at 09/08/14 1144  . ipratropium-albuterol (DUONEB) 0.5-2.5 (3) MG/3ML nebulizer solution 3 mL  3 mL Nebulization TID Janece Canterbury, MD   3 mL at 09/08/14 1144  . LORazepam (ATIVAN) tablet 0.5 mg  0.5 mg Oral Q6H PRN Jenne Campus, MD   0.5 mg at 09/08/14 1033  . LORazepam (ATIVAN) tablet 1 mg  1 mg Oral BID Jenne Campus, MD   1 mg at 09/08/14 0800  . magnesium hydroxide (MILK OF MAGNESIA) suspension 30 mL  30 mL Oral Daily PRN Freda Munro May Agustin, NP      . mometasone-formoterol [facility]) 200-5 MCG/ACT inhaler 2 puff  2 puff Inhalation BID Janece Canterbury, MD   2 puff at 09/08/14 [phone]  . tamsulosin (FLOMAX) capsule 0.4 mg  0.4 mg Oral QPC supper Janece Canterbury, MD   0.4 mg at 09/07/14 1700  . traZODone (DESYREL) tablet 100 mg  100 mg Oral QHS Ursula Alert, MD        Lab Results:  Results for orders placed or performed during the hospital encounter of 09/05/14 (from the past 48 hour(s))  T3     Status: None   Collection Time: 09/08/14  6:15 AM  Result Value Ref Range   T3, Total 100.0 80.0 - 204.0 ng/dl    Comment: Performed at [facility]  T4, free     Status: None   Collection Time: 09/08/14  6:15 AM  Result Value Ref Range   Free T4 1.28 0.80 - 1.80 ng/dL    Comment: Performed at [facility]    Physical Findings: AIMS: Facial and Oral Movements Muscles of Facial Expression: None, normal Lips and Perioral Area: None, normal Jaw: None, normal Tongue: None, normal,Extremity Movements Upper (arms, wrists, hands, fingers): None, normal Lower (legs, knees, ankles, toes): None, normal, Trunk Movements Neck, shoulders, hips: None, normal, Overall Severity Severity of abnormal movements (highest score from questions above): None, normal Incapacitation due to abnormal movements: None, normal Patient's awareness of abnormal movements (rate only patient's  report): No Awareness, Dental Status Current problems with teeth and/or dentures?: No Does patient usually wear dentures?: No  CIWA:    COWS:     Assessment: Patient remains manic, intrusive, irritable, hypersexual, pressured in speech and loud at times. His insight is limited, and denies having anything wrong with him. He is, however, somewhat better than upon admission, as noted by a partially improved ability to de- escalate and less blatant hypersexuality, improved ability to respond to redirections from staff. So far has tolerated Ativan and Depakote ER well. He is not being treated with antipsychotics due to  Cardiac history and QTC elevation . Treatment Plan Summary: Daily contact with patient to assess and evaluate symptoms and progress in treatment Medication management See below  Plan: Continue inpatient  treatment, support, milieu. Continue Ativan  1 mg BID Increase Depakote ER to 1000 mg po qpm starting tomorrow. Ativan 0.5 mgrs Q 6 hours PRN Agitation. T3 and FT4 -wnl.  Will increase Trazodone to 100 mg po qhs for sleep. Staff to communicate with family member as above, pending written consent ( purpose is collateral information ). Staff to request medical records from [facility] ( where patient had cardiac intervention) for collateral information as well, if patient consents.  Medical Decision Making Problem Points:  Established problem, stable/improving (1), Review of last therapy session (1) and Review of psycho-social stressors (1) Data Points:  Review or order clinical lab tests (1) Review of medication regiment & side effects (2) Review of new medications or change in dosage (2)  I certify that inpatient services furnished can reasonably be expected to improve the patient's condition.   Camdyn Beske md 09/08/2014, 3:04 PM

## 2014-09-08 NOTE — Progress Notes (Signed)
Pt alert, oriented and cooperative. Affect/mood labile.-SI/HI, verbally contracts for safety. Pt is flirtatious, intrusive and attention-seeking but is easily redirected. Denies A/V hall, However thought process consist of flight of ideas, delusions, grandeur and derealization. Denies pain or discomfort. Visible in milieu. Interactive with peers and staff.  Emotional support and encouragement given. Will continue to monitor closely and evaluate for stabilization.

## 2014-09-08 NOTE — Progress Notes (Addendum)
Patient remains loud, irritable, demanding and intrusive. Continues to make inappropriate comments to male peers and staff. Patient overheard making frequent phone calls regarding his late car payment. Asked this Clinical research associatewriter to obtain his debit card number from his locker so that he could pay the bill. This Clinical research associatewriter along with witness B Engineer, materialsGuthrie RN retrieved debit card number from locker however patient was unsuccessful in making payment. Patient now states, "I need the checking account number in my wallet. Please, just one more time. We were getting along so well." Explained unit rules had already been broken to accommodate patient's request and that no further trips would be made to the locker. Patient confirmed understanding and note with debit card number on it was shredded in front of patient and C Scientist, research (physical sciences)Dopson RN. Will make oncoming staff aware. [name] [name], [name] [name]     Patient refused 1800 depakote believing it caused him to feel hung over this morning. Reminded patient he only slept 3.5 hours which would account for his drowsiness. Patient then stated, "well then that's the medicine that kept me up." Patient with no insight into his present illness and judgment greatly impaired. Attempted med education was unsuccessful with patient stating, "I know my rights and I can refuse." [name] [name], [name] [name]

## 2014-09-08 NOTE — [facility] Group Notes (Signed)
[facility] LCSW Group Therapy  09/08/2014 1:05 PM   Type of Therapy:  Group Therapy  Participation Level:  Active  Participation Quality:  Attentive  Affect:  Appropriate  Cognitive:  Appropriate  Insight:  Improving  Engagement in Therapy:  Engaged  Modes of Intervention:  Clarification, Education, Exploration and Socialization  Summary of Progress/Problems: Today's group focused on relapse prevention.  We defined the term, and then brainstormed on ways to prevent relapse.  Came into group towards the end.  Stated he gets his support from family and friends, but added that he doesn't need any support because he has no needs. "I've got money and plans.  What more is there?"  [name] [name], [name] [name] 09/08/2014 , 1:05 PM

## 2014-09-08 NOTE — Progress Notes (Signed)
Patient ID: [name] [name], male   DOB: 05/10/1946, 68 y.o.   MRN: [id number] PER STATE REGULATIONS 482.30  THIS CHART WAS REVIEWED FOR MEDICAL NECESSITY WITH RESPECT TO THE PATIENT'S ADMISSION/ DURATION OF STAY.  NEXT REVIEW DATE: 09/13/2014 Willa RoughJENNIFER JONES Labrina Lines, RN, BSN CASE MANAGER

## 2014-09-08 NOTE — Plan of Care (Signed)
Problem: Alteration in thought process Goal: STG-Patient is able to follow short directions Outcome: Not Progressing Patient requires near constant redirect and often does not follow staff directions. Goal: STG-Patient is able to sleep at least 6 hours per night Outcome: Not Progressing Patient sleeping in small segments totaling only 3.5 hours last night.

## 2014-09-08 NOTE — [facility] Group Notes (Signed)
[facility] Group Notes:  (Nursing/MHT/Case Management/Adjunct)  Date:  09/08/2014  Time:  9:30am  Type of Therapy:  Nursing Wellness - Healthy Lifestyle and Leisure Activities  Participation Level:  Did Not Attend  Participation Quality:    Affect:    Cognitive:    Insight:    Engagement in Group:    Modes of Intervention:    Summary of Progress/Problems: patient was invited and encouraged however refused to attend group. Remained in hallway.  Merian CapronFriedman, Mory Herrman [facility] 09/08/2014, 1:15 PM

## 2014-09-09 DIAGNOSIS — R45851 Suicidal ideations: Secondary | ICD-10-CM

## 2014-09-09 MED ORDER — MOMETASONE FURO-FORMOTEROL FUM 200-5 MCG/ACT IN AERO
2.0000 | INHALATION_SPRAY | RESPIRATORY_TRACT | Status: AC
  Administered 2014-09-09: 2 via RESPIRATORY_TRACT

## 2014-09-09 MED ORDER — IPRATROPIUM-ALBUTEROL 0.5-2.5 (3) MG/3ML IN SOLN
3.0000 mL | RESPIRATORY_TRACT | Status: AC
  Administered 2014-09-09: 3 mL via RESPIRATORY_TRACT
  Filled 2014-09-09: qty 3

## 2014-09-09 MED ORDER — TEMAZEPAM 15 MG PO CAPS
15.0000 mg | ORAL_CAPSULE | Freq: Every day | ORAL | Status: DC
  Administered 2014-09-09 – 2014-09-11 (×2): 15 mg via ORAL
  Filled 2014-09-09 (×3): qty 1

## 2014-09-09 MED ORDER — IPRATROPIUM-ALBUTEROL 0.5-2.5 (3) MG/3ML IN SOLN
3.0000 mL | Freq: Once | RESPIRATORY_TRACT | Status: AC
  Administered 2014-09-09: 3 mL via RESPIRATORY_TRACT
  Filled 2014-09-09: qty 3

## 2014-09-09 MED ORDER — TRAZODONE HCL 50 MG PO TABS
50.0000 mg | ORAL_TABLET | Freq: Every evening | ORAL | Status: DC | PRN
  Filled 2014-09-09: qty 4

## 2014-09-09 NOTE — Progress Notes (Signed)
[facility] MD Progress Note  09/09/2014 1:19 PM [name] [name]  MRN:  [id number] Subjective:   Patient states "I want to be be discharged." Objective:  I have discussed case with treatment team/ Nursing Staff , and have met with patient. As per staff, patient still intrusive, loud, irritable , and hypersexual. Pt continues to be hyperactive ,requiring redirection. Pt is delusional ,as well as manic with some improvement (per review of documentation in previous notes since admission). Pt per staff ,has not been sleeping ,continues to be very loud ,going in to other patients room .  Patient's insight remains poor- he continues to state he has to visit a dying friend, and demands immediate release loudly. Becomes irritable and verbally threatening at times.  ( On Depakote and Ativan- we are avoiding use of antipsychotics due to cardiac history and elevated QTc)  Appreciate Hospitalist Consult.A reconsult was requested since pt is still "manic" to know if we could start a small dose of Antipsychotic.However per Hospitalist, it is the recommendation per cardiology that we need to avoid any medication that can cause qtc prolongation at this time ,including seroquel. TSH on lower range of normal. Obtained T3 and FT4- wnl.    Diagnosis:  Bipolar Disorder type ,recurrent ,severe ,Manic with psychotic features.  Total Time spent with patient: 30 minutes    ADL's:fair  Sleep:poor   Appetite:  Fair   Psychiatric Specialty Exam: Physical Exam  Review of Systems  Constitutional: Negative for fever and chills.  Eyes: Negative.   Respiratory: Negative for cough and shortness of breath.   Cardiovascular: Negative for chest pain, palpitations, orthopnea and PND.  Gastrointestinal: Negative for vomiting, abdominal pain and blood in stool.  Genitourinary: Negative for dysuria, urgency and frequency.  Musculoskeletal: Negative for myalgias and neck pain.  Skin: Negative for rash.  Neurological:  Negative for headaches.    Blood pressure 98/57, pulse 83, temperature 97.7 F (36.5 C), temperature source Oral, resp. rate 20, height 2' 2.97" (0.685 m), weight 75.297 kg (166 lb).Body mass index is 160.47 kg/(m^2).  General Appearance: Fairly Groomed  Engineer, water::  Good  Speech:  Pressured  Volume:  variable, loud at times  Mood:  manic  Affect:  Labile and irritable  Thought Process:  Disorganized  Orientation:  Other:  alert, attentive  Thought Content:  grandiose, poor insight, denies hallucinations and does not appear internally preoccupied  Suicidal Thoughts:  Yes.  with intent/plan pt has been making some threats to jump out of the window today since he is not being discharged.  Homicidal Thoughts:  No  Memory:  recent and remote fair  Judgement:  Impaired  Insight:  Lacking  Psychomotor Activity:  Increased  Concentration:  Fair  Recall:  [place] of Knowledge:Good  Language: Good  Akathisia:  Negative  Handed:  Right  AIMS (if indicated):     Assets:  Desire for Improvement Resilience  Sleep:  Number of Hours: 2.25   Musculoskeletal: Strength & Muscle Tone: within normal limits Gait & Station: normal Patient leans: N/A  Current Medications: Current Facility-Administered Medications  Medication Dose Route Frequency Provider Last Rate Last Dose  . acetaminophen (TYLENOL) tablet 650 mg  650 mg Oral Q6H PRN Janett Labella, NP   650 mg at 09/09/14 1227  . alum & mag hydroxide-simeth (MAALOX/MYLANTA) 200-200-20 MG/5ML suspension 30 mL  30 mL Oral Q4H PRN Janett Labella, NP   30 mL at 09/07/14 1823  . aspirin EC tablet 81 mg  81 mg Oral Daily Janece Canterbury, MD   81 mg at 09/09/14 0754  . atorvastatin (LIPITOR) tablet 40 mg  40 mg Oral q1800 Janece Canterbury, MD   40 mg at 09/08/14 1748  . clotrimazole (LOTRIMIN) 1 % cream   Topical BID Janece Canterbury, MD      . divalproex (DEPAKOTE ER) 24 hr tablet 1,000 mg  1,000 mg Oral QPM Ursula Alert, MD   1,000 mg  at 09/09/14 0753  . divalproex (DEPAKOTE ER) 24 hr tablet 750 mg  750 mg Oral QPM Caytlyn Evers, MD   750 mg at 09/08/14 1800  . ferrous sulfate tablet 325 mg  325 mg Oral TID WC Janece Canterbury, MD   325 mg at 09/09/14 1227  . furosemide (LASIX) tablet 40 mg  40 mg Oral Daily Janece Canterbury, MD   40 mg at 09/09/14 0753  . gabapentin (NEURONTIN) capsule 200 mg  200 mg Oral TID Ursula Alert, MD   200 mg at 09/09/14 1227  . ipratropium-albuterol (DUONEB) 0.5-2.5 (3) MG/3ML nebulizer solution 3 mL  3 mL Nebulization TID Janece Canterbury, MD   3 mL at 09/09/14 1227  . LORazepam (ATIVAN) tablet 0.5 mg  0.5 mg Oral Q6H PRN Jenne Campus, MD   0.5 mg at 09/09/14 1227  . LORazepam (ATIVAN) tablet 1 mg  1 mg Oral BID Jenne Campus, MD   1 mg at 09/09/14 0753  . magnesium hydroxide (MILK OF MAGNESIA) suspension 30 mL  30 mL Oral Daily PRN Freda Munro May Agustin, NP      . mometasone-formoterol [facility]) 200-5 MCG/ACT inhaler 2 puff  2 puff Inhalation BID Janece Canterbury, MD   2 puff at 09/09/14 0753  . tamsulosin (FLOMAX) capsule 0.4 mg  0.4 mg Oral QPC supper Janece Canterbury, MD   0.4 mg at 09/08/14 1747  . temazepam (RESTORIL) capsule 15 mg  15 mg Oral QHS Ursula Alert, MD      . traZODone (DESYREL) tablet 50 mg  50 mg Oral QHS PRN Ursula Alert, MD        Lab Results:  Results for orders placed or performed during the hospital encounter of 09/05/14 (from the past 48 hour(s))  T3     Status: None   Collection Time: 09/08/14  6:15 AM  Result Value Ref Range   T3, Total 100.0 80.0 - 204.0 ng/dl    Comment: Performed at [facility]  T4, free     Status: None   Collection Time: 09/08/14  6:15 AM  Result Value Ref Range   Free T4 1.28 0.80 - 1.80 ng/dL    Comment: Performed at [facility]    Physical Findings: AIMS: Facial and Oral Movements Muscles of Facial Expression: None, normal Lips and Perioral Area: None, normal Jaw: None, normal Tongue: None, normal,Extremity  Movements Upper (arms, wrists, hands, fingers): None, normal Lower (legs, knees, ankles, toes): None, normal, Trunk Movements Neck, shoulders, hips: None, normal, Overall Severity Severity of abnormal movements (highest score from questions above): None, normal Incapacitation due to abnormal movements: None, normal Patient's awareness of abnormal movements (rate only patient's report): No Awareness, Dental Status Current problems with teeth and/or dentures?: No Does patient usually wear dentures?: No  CIWA:    COWS:     Assessment: Patient remains manic, intrusive, irritable, hypersexual, pressured in speech and loud at times. However he has been showing some progress (per review of previous documentation) . His insight is limited, and denies having anything wrong  with him.  So far has tolerated Ativan and Depakote ER well. He is not being treated with antipsychotics due to  Cardiac history and QTC elevation . Treatment Plan Summary: Daily contact with patient to assess and evaluate symptoms and progress in treatment Medication management See below  Plan: Continue inpatient treatment, support, milieu. Continue Ativan  1 mg BID Increase Depakote ER to 1000 mg po qpm starting today. Ativan 0.5 mgrs Q 6 hours PRN Agitation. T3 and FT4 -wnl.  Will start Restoril for sleep. Will make Trazodone prn. If pt has good response to Restoril ,Trazodone could be discontinued. Pt has identified a support system outside as [employer] - CSW to contact.  Hospitalist reconsulted regarding starting a small dose of Seroquel - However ,per consult note - cardiology has recommended to avoid all antipsychotics or any medications that can have a cardiac impact or prolong qtc.   Medical Decision Making Problem Points:  Established problem, stable/improving (1), Review of last therapy session (1) and Review of psycho-social stressors (1) Data Points:  Review or order clinical lab tests (1) Review of  medication regiment & side effects (2) Review of new medications or change in dosage (2)  I certify that inpatient services furnished can reasonably be expected to improve the patient's condition.   [name] Justen md 09/09/2014, 1:19 PM

## 2014-09-09 NOTE — Progress Notes (Signed)
The patient just became agitated with this author after speaking with him for no more than three minutes. The patient had just informed this Thereasa Parkinauthor that he has already earned "billions of dollars" and that he is losing $5,000 a day for every day that he is in the hospital. He also indicated that he can stay awake for three days at a time and that he learned to do that while he was in [place]. Finally, the patient mentioned that he has to go to [place] for a NAS[place] race since he has $1,400 invested in tickets for the event. After the patient's 1:1 staff returned to the floor, this author sat down and began to yell at this author, ie. "you don't give a shit about me... I know that you don't give a shit about me"!

## 2014-09-09 NOTE — Progress Notes (Addendum)
Patient ID: [name] [name], male   DOB: 07/28/1946, 68 y.o.   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. We have been called to assist with medication management of his chronic medical problems. [facility]. He moved from [place] recently and was hospitalized [facility] early September because of complete heart block. He had not followed up with cardiology for his pacemaker and his battery had run out. His PPM was replaced, but he was found on ECHO to have a bicuspid AV with severe stenosis, normal EF and 4.9cm ascending thoracic aneurysm. He was [facility] and underwent porcine AVR and repair of his aneurysm. His post-operative course has been complicated by volume overload requiring diuresis and psychosis for which he was briefly treated with olanzapine. Patient is an unreliable historian secondary to psychosis and mania but states his breathing is better than usual and his swelling has gone down since last month. H  Assessment and Plan:   Psychosis - Per psychiatry - Recommend against medications that would prolong QTc  SSS s/p PPM, battery recently replaced, prolonged QTc - d/w with cardiologist on call and ecommend against medications that would prolong QTc - 12 lead EKG reviewed  CAD, AVR and aneurysm repair - Continue ASA, statin. Not on BB possibly due to low BPs and paced rhythm -  If chest pain, contact [facility] [name] and cardiothoracic surgery [name] for transfer to their institution   Likely has chronic diastolic heart failure responsible for volume overload.  - Post-op was on lasix 40mg  BID and spironolactone 12.5mg  BID, this was reduced to lasix 40mg  QD and no spironolactone - Low salt diet - Resumed lasix - Weekly BMP to check potassium and creatinine  COPD without acute exacerbation - Resume ICS/LABA - Schedule duonebs TID - No need for steroids or antibiotics at this time  Iron deficiency anemia - mild, continue iron supplements  BPH - stable, continue flomax  Chronic pain - continue gabapentin   Family Communication: Pt at bedside  Will sign off for now but please call me with additional questions.   Debbora PrestoMAGICK-Varun Jourdan, MD  [facility] Pager [phone]  If 7PM-7AM, please contact night-coverage [email or web address] Password [email or web address]   Debbora PrestoMAGICK-Seraphina Mitchner, MD  [facility] Pager [phone]  If 7PM-7AM, please contact night-coverage [email or web address] Password [email or web address] 09/09/2014, 2:58 PM   LOS: 4 days   HPI/Subjective: No events overnight.   Objective: Filed Vitals:   09/08/14 0551 09/09/14 0500 09/09/14 0640 09/09/14 0641  BP: 84/52  112/85 98/57  Pulse: 86  83 83  Temp:   97.7 F (36.5 C)   TempSrc:   Oral   Resp:   20   Height:      Weight:  75.297 kg (166 lb)     No intake or output data in the 24 hours ending 09/09/14 1458  Exam:   General:  Pt is alert, follows commands appropriately, not in acute distress  Cardiovascular: Regular rate  and rhythm, S1/S2, no murmurs, no rubs, no gallops  Respiratory: Clear to auscultation bilaterally, no wheezing, no crackles, no rhonchi  Abdomen: Soft, non tender, non distended, bowel sounds present, no guarding  Extremities: No edema, pulses DP and PT palpable bilaterally  Neuro: Grossly nonfocal  Data Reviewed: Basic Metabolic Panel: No results for input(s):  NA, K, CL, CO2, GLUCOSE, BUN, CREATININE, CALCIUM, MG, PHOS in the last 168 hours. Liver Function Tests: No results for input(s): AST, ALT, ALKPHOS, BILITOT, PROT, ALBUMIN in the last 168 hours. No results for input(s): LIPASE, AMYLASE in the last 168 hours. No results for input(s): AMMONIA in the last 168 hours. CBC:  Recent Labs Lab 09/04/14 2121  WBC 8.7  NEUTROABS 6.5  HGB 12.5*  HCT 39.0  MCV 93.1  PLT 230   CBG:  Recent Labs Lab 09/03/14 1053 09/03/14 1833 09/03/14 2252 09/04/14 0908 09/04/14 1806  GLUCAP 107* 147* 167* 158* 113*   Scheduled Meds: . aspirin EC  81 mg Oral Daily  . atorvastatin  40 mg Oral q1800  . clotrimazole   Topical BID  . divalproex  1,000 mg Oral QPM  . divalproex  750 mg Oral QPM  . ferrous sulfate  325 mg Oral TID WC  . furosemide  40 mg Oral Daily  . gabapentin  200 mg Oral TID  . ipratropium-albuterol  3 mL Nebulization TID  . LORazepam  1 mg Oral BID  . mometasone-formoterol  2 puff Inhalation BID  . tamsulosin  0.4 mg Oral QPC supper  . temazepam  15 mg Oral QHS   Continuous Infusions:

## 2014-09-09 NOTE — Progress Notes (Signed)
[name] [name] is seen OOB UAL on the 500 hall today. 1:1 is started this morning due to patient's intrusiveness and he is tolerating this restriction poorly.   A HE got very indignent today at lunchtime ( because on a 1:1 he is  not allowed to attend his meal lunch in the cafe' with the rest of the patients and this angered him greatly. HE was able to speak with this nurse and sexcribe his intnese feelings but was unwillingly to realistically process the situation, looking at the situation very concretely.   R Safety is in place and 1:1 continues.

## 2014-09-09 NOTE — Progress Notes (Signed)
Patient ID: [name] [name], male   DOB: 03/01/1946, 68 y.o.   MRN: [id number]  Dear Dr. Elna BreslowEappen,   Given pt's history of  SSS s/p PPM, battery recently replaced, prolonged QTc at baseline, cardiologist recommendation is to avoid any medications for now that would prolong QTc, including Seroquel.   Still awaiting for 12 lead EKG, please have staff upload into the system so we can have the cardiologist review for final recommendations.   Debbora PrestoMAGICK-MYERS, ISKRA, MD  [facility] Pager [phone] Cell [phone]  If 7PM-7AM, please contact night-coverage [email or web address] Password [email or web address]

## 2014-09-09 NOTE — Progress Notes (Signed)
Nursing 1:1 note D:Pt observed sitting in dayroom. RR even and unlabored. No distress noted.Pt agitated and irritated especially if he does not get inhaler, nebulizer Tx. Pt has not stopped talking, since 1900, but pt complains he has a hard time breathing. Pt appears to be congested, but continues to want medications. Pt is very moody, pt is up beat and happy, then will be irritated, then calm. Pt has one track mind and is not open to any suggestion that he does not agree with. Pt shows closed mindedness.   A: 1:1 observation continues for safety  R: pt remains safe

## 2014-09-09 NOTE — Progress Notes (Signed)
Pt continues to be disrespectful to staff, especially minorities. Pt continues to say racial slurs to staff. Pt was informed that he would have to talk to staff in a respectful way while he was here.

## 2014-09-09 NOTE — Progress Notes (Signed)
Pt awake and alert. Demanding nebulizer treatment. C/o difficulty breathing and congestion. "If I don't get it -call EMS now I am going out". Asher MuirJamie NP notified and order received. See MAR. Will continue to monitor closely and evaluate for stabilization.

## 2014-09-09 NOTE — [facility] Group Notes (Signed)
[facility] LCSW Group Therapy  09/09/2014 1:56 PM  Type of Therapy:  Group Therapy  Participation Level: Invited- Did Not Attend  Summary of Progress/Problems: Today's Topic: Hope-Group members were asked to explore the concept of Hope. Group members were challenged to identify what hope means to them, reflect on past experiences with hope, and explore what makes them feel hopeful/regain a sense of hope.   Smart, Neilani Duffee LCSWA 09/09/2014, 1:56 PM

## 2014-09-09 NOTE — Progress Notes (Addendum)
1:1   Pt remains on 1:1 due to poor boundaries, poor judgements and generally his manic behaviors. He remains consistent with sexually inappropriate behaviors and language but will respond to boundary-setting when staff chooses to do so. A He was observed taking ALL of his medications for dinnertime today...unprompted and without any resistance. R He is given court documentation regarding his involuntary commit and he is trying to process and understand this Staff to continue to support, explain and offer assistance to pt.

## 2014-09-09 NOTE — [facility] Group Notes (Signed)
[facility] LCSW Aftercare Discharge Planning Group Note   09/09/2014 10:12 AM  Participation Quality:  Did not attend.    [name],[name]   

## 2014-09-09 NOTE — Progress Notes (Signed)
Pt constantly complained of not being able to breathe, but pt continued to talk most of the night not appearing to be in any distress or have any labored breathing. Pt o2 sat's were 98% on RA when checked.

## 2014-09-09 NOTE — Progress Notes (Signed)
[facility] Group Notes:  (Nursing/MHT/Case Management/Adjunct)  Date:  09/09/2014  Time:  10:09 PM  Type of Therapy:  Psychoeducational Skills  Participation Level:  Active  Participation Quality:  Intrusive and Monopolizing  Affect:  Excited  Cognitive:  Disorganized  Insight:  Lacking  Engagement in Group:  Distracting and Monopolizing  Modes of Intervention:  Education  Summary of Progress/Problems: The patient mentioned in group that his day has been confusing since he took a nap this afternoon and didn't realize that it was evening rather than day time. The patient had to be redirected for talking during group and for talking out of turn. He spoke at great length and was difficult to redirect since he spoke nearly non-stop about various topics other than what happened to him today. As a theme for the day, his coping skills are as follows: exercise, hunting, and fishing.   Neita Landrigan S 09/09/2014, 10:09 PM

## 2014-09-10 ENCOUNTER — Encounter (HOSPITAL_COMMUNITY): Payer: Self-pay | Admitting: *Deleted

## 2014-09-10 DIAGNOSIS — F3160 Bipolar disorder, current episode mixed, unspecified: Secondary | ICD-10-CM

## 2014-09-10 DIAGNOSIS — I25119 Atherosclerotic heart disease of native coronary artery with unspecified angina pectoris: Secondary | ICD-10-CM

## 2014-09-10 MED ORDER — IPRATROPIUM-ALBUTEROL 0.5-2.5 (3) MG/3ML IN SOLN
3.0000 mL | Freq: Once | RESPIRATORY_TRACT | Status: AC
  Administered 2014-09-10: 3 mL via RESPIRATORY_TRACT
  Filled 2014-09-10: qty 3

## 2014-09-10 MED ORDER — HYDROCODONE-ACETAMINOPHEN 5-325 MG PO TABS
2.0000 | ORAL_TABLET | Freq: Once | ORAL | Status: AC
  Administered 2014-09-10: 2 via ORAL
  Filled 2014-09-10: qty 2

## 2014-09-10 MED ORDER — IBUPROFEN 600 MG PO TABS
600.0000 mg | ORAL_TABLET | Freq: Four times a day (QID) | ORAL | Status: DC | PRN
  Administered 2014-09-10 – 2014-09-12 (×2): 600 mg via ORAL
  Filled 2014-09-10: qty 1

## 2014-09-10 NOTE — ED Notes (Signed)
The pt is sleeping with the sitter at his bedside

## 2014-09-10 NOTE — Progress Notes (Signed)
Per NP on duty pt to be sent out for medical Evaluation, due to pt complaining of chest pains.
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
## 2014-09-10 NOTE — Progress Notes (Signed)
Pt was given pillow to help splint when he coughed

## 2014-09-10 NOTE — ED Notes (Signed)
The pt came from behavorial by [facility] with chest pain since  1830.  The pt  Reports that his pain started in his mid-chest incision while he was coloring.  He has a cabg  Sept 7th 2015..  Incision pain worse with coughing.  He gets hhns there and he reports that he does niot get enough hhns.  His chest discomfort started after he argued with the staff there.  On arrival the pt is alert   C/o pain mid-chest incision oand over his pacemaker.  Skin warm and dry.  Chest incision appears well-healed no redness or swelling

## 2014-09-10 NOTE — ED Notes (Signed)
Pt requesting a hhn.  edp aware.  The pt is not in any distress

## 2014-09-10 NOTE — Progress Notes (Signed)
Pt returned from the ER. Pt was given a breathing Tx and Vicodin. Pt refused to get an X-ray . Pt stated he got one last Saturday and nothing has changed since.

## 2014-09-10 NOTE — ED Provider Notes (Signed)
CSN: [id number]     Arrival date & time 09/10/14  0214 History   First MD Initiated Contact with Patient 09/10/14 [phone]     Chief Complaint  Patient presents with  . Incisional Pain     (Consider location/radiation/quality/duration/timing/severity/associated sxs/prior Treatment) HPI  [name] [name] is a 68 y.o. male with past medical history of aortic valve stenosis status post valve replacement in September 2015, COPD, pacemaker, coronary disease coming in with chest pain. [facility] chest.. He states his pain is midsternal at his sternotomy site. This began at 6 AM yesterday.He feels as if the bone is coaching back and forth. It radiates out bilaterally. There is associated with shortness of breath, there is no emesis or diaphoresis. Patient denies fevers chills or coughing. He denies this feeling like his prior heart attacks. Moving makes his pain worse sitting still makes it better. He took Tylenol with minimal relief.He denies any recent infections. Patient has no further complaints.  10 Systems reviewed and are negative for acute change except as noted in the HPI.     Past Medical History  Diagnosis Date  . Severe aortic valve stenosis     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  . COPD (chronic obstructive pulmonary disease)   . Thoracic ascending aortic aneurysm     [facility]  . COPD (chronic obstructive pulmonary disease)   . BPH (benign prostatic hyperplasia)   . Pacemaker     [facility]  . SSS (sick sinus syndrome)     s/p PPM   . CAD (coronary artery disease)     ?MI in 2007  . Pneumothorax     traumatic 2007  . Anxiety and depression    Past Surgical History  Procedure Laterality Date  . Cardiac surgery      ascending thoracic aneurysm repair 06/2014  . Pacemaker placement  2007    battery replaced 07/04/2014  . Hiatal hernia repair    . Knee surgery      L5-6  .  Lumbar fusion    . Aortic valve replacement      porcine valve   Family History  Problem Relation Age of Onset  . CAD Father   . Diabetes Father   . High blood pressure Brother   . Cancer Brother    History  Substance Use Topics  . Smoking status: Former Games developermoker  . Smokeless tobacco: Not on file  . Alcohol Use: Yes    Review of Systems    Allergies  Review of patient's allergies indicates no known allergies.  Home Medications   Prior to Admission medications   Medication Sig Start Date End Date Taking? Authorizing Provider  aspirin 81 MG tablet Take 81 mg by mouth daily.   Yes Historical Provider, MD  atorvastatin (LIPITOR) 40 MG tablet Take 40 mg by mouth daily.   Yes Historical Provider, MD  ferrous sulfate 325 (65 FE) MG EC tablet Take 325 mg by mouth 3 (three) times daily with meals.   Yes Historical Provider, MD  Fluticasone-Salmeterol (ADVAIR) 500-50 MCG/DOSE AEPB Inhale 1 puff into the lungs 2 (two) times daily.   Yes Historical Provider, MD  furosemide (LASIX) 40 MG tablet Take 40 mg by mouth daily.   Yes Historical Provider, MD  gabapentin (NEURONTIN) 100 MG capsule Take 100 mg by mouth 3 (three) times daily.   Yes Historical Provider, MD  indomethacin (INDOCIN)  25 MG capsule Take 25 mg by mouth 2 (two) times daily with a meal.   Yes Historical Provider, MD  oxycodone (OXY-IR) 5 MG capsule Take 5 mg by mouth every 4 (four) hours as needed for pain.   Yes Historical Provider, MD  polyethylene glycol (MIRALAX / GLYCOLAX) packet Take 17 g by mouth daily as needed for mild constipation or moderate constipation.   Yes Historical Provider, MD  tamsulosin (FLOMAX) 0.4 MG CAPS capsule Take 0.4 mg by mouth daily after supper.   Yes Historical Provider, MD  albuterol (VENTOLIN HFA) 108 (90 BASE) MCG/ACT inhaler Inhale 2 puffs into the lungs every 6 (six) hours as needed for wheezing or shortness of breath.    Historical Provider, MD  ipratropium-albuterol (DUONEB) 0.5-2.5 (3)  MG/3ML SOLN Take 3 mLs by nebulization every 4 (four) hours as needed.     Historical Provider, MD   BP 93/70 mmHg  Pulse 80  Temp(Src) 98 F (36.7 C) (Oral)  Resp 17  Ht 2' 2.97" (0.685 m)  Wt 166 lb (75.297 kg)  BMI 160.47 kg/m2  SpO2 97% Physical Exam  Constitutional: He is oriented to person, place, and time. Vital signs are normal. He appears well-developed and well-nourished.  Non-toxic appearance. He does not appear ill. No distress.  HENT:  Head: Normocephalic and atraumatic.  Nose: Nose normal.  Mouth/Throat: Oropharynx is clear and moist. No oropharyngeal exudate.  Eyes: Conjunctivae and EOM are normal. Pupils are equal, round, and reactive to light. No scleral icterus.  Neck: Normal range of motion. Neck supple. No tracheal deviation, no edema, no erythema and normal range of motion present. No thyroid mass and no thyromegaly present.  Cardiovascular: Normal rate, regular rhythm, S1 normal, S2 normal, normal heart sounds, intact distal pulses and normal pulses.  Exam reveals no gallop and no friction rub.   No murmur heard. Pulses:      Radial pulses are 2+ on the right side, and 2+ on the left side.       Dorsalis pedis pulses are 2+ on the right side, and 2+ on the left side.  Pulmonary/Chest: Effort normal and breath sounds normal. No respiratory distress. He has no wheezes. He has no rhonchi. He has no rales.  Midline sternotomy scar. Left pacemaker seen.  Abdominal: Soft. Normal appearance and bowel sounds are normal. He exhibits no distension, no ascites and no mass. There is no hepatosplenomegaly. There is no tenderness. There is no rebound, no guarding and no CVA tenderness.  Musculoskeletal: Normal range of motion. He exhibits no edema or tenderness.  Lymphadenopathy:    He has no cervical adenopathy.  Neurological: He is alert and oriented to person, place, and time. He has normal strength. No cranial nerve deficit or sensory deficit. He exhibits normal muscle tone.  GCS eye subscore is 4. GCS verbal subscore is 5. GCS motor subscore is 6.  Skin: Skin is warm, dry and intact. No petechiae and no rash noted. He is not diaphoretic. No erythema. No pallor.  Nursing note and vitals reviewed.   ED Course  Procedures (including critical care time) Labs Review Labs Reviewed  TSH  T3  T4, FREE    Imaging Review No results found.   EKG Interpretation   Date/Time:  Saturday September 10 2014 03:31:11 EST Ventricular Rate:  80 PR Interval:  151 QRS Duration: 157 QT Interval:  452 QTC Calculation: 521 R Axis:   -90 Text Interpretation:  Sinus or ectopic atrial rhythm Atrial premature  complex Nonspecific IVCD with LAD LVH with secondary repolarization  abnormality Inferior infarct, acute (LCx) Anterior infarct, old No  significant change since last tracing Confirmed by Erroll Luna  [phone]) on 09/10/2014 4:51:42 AM      MDM   Final diagnoses:  None    Patient since emergency department out of concern for chest pain. Do not believe this is cardiac in nature. It appears to be very musculoskeletal as it is worse with moving, it is over her sternotomy site, and he states it feels like bone is moving back and forth.He states he had a chest xray yesterday that was normal.  . Patient was given breathing treatment although he is not wheezing he is requesting for COPD.He has been having rapid flight of ideas in the ED and does not appear to be in any acute distress.   He was also given Norco for pain relief.  His VS have remained within his normal limits, there was an episode of bradycardia while sleeping,  and he is safe for discharge.      Tomasita Crumble, MD 09/10/14 [phone]

## 2014-09-10 NOTE — Progress Notes (Signed)
Pt seen this AM, doing well, wants to go home. Please see recommendations in the note from 09/09/2014.  Debbora PrestoMAGICK-Cindy Fullman, MD  [facility] Pager [phone] Cell [phone]  If 7PM-7AM, please contact night-coverage [email or web address] Password [email or web address]

## 2014-09-10 NOTE — [facility] Group Notes (Signed)
[facility] Group Notes:  (Nursing/MHT/Case Management/Adjunct)  Date:  09/10/2014  Time:  6:31 PM  Type of Therapy:  Psychoeducational Skills--self inventory with rn  Participation Level:  Active  Participation Quality:  Monopolizing and Redirectable  Affect:  Excited and Irritable  Cognitive:  Lacking  Insight:  Lacking  Engagement in Group:  Engaged  Modes of Intervention:  Discussion and Education  Summary of Progress/Problems:  [name] [name], [name] [name] 09/10/2014, 6:31 PM

## 2014-09-10 NOTE — [facility] Group Notes (Signed)
[facility] LCSW Group Therapy  09/10/2014 3:29 PM  Type of Therapy:  Group Therapy  Participation Level:  Active  Participation Quality:  Monopolizing and Sharing  Affect:  Defensive  Cognitive:  Oriented  Insight:  Off Topic  Engagement in Therapy:  Off Topic  Modes of Intervention:  Discussion, Education, Exploration, Rapport Building and Support  Summary of Progress/Problems: Pt was disruptive during group trying to discuss his frustration with not knowing when he can discharge and other things that were of topic. Pt was redirectable and apologized, however continued to be disruptive often when facilitator tried to discuss different types of support networks. Pt expressed understanding of support networks, positive/negative supports, as well as professional and personal supports.   Seabron SpatesVaughn, Anadia Helmes Anne 09/10/2014, 3:29 PM
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
## 2014-09-10 NOTE — Progress Notes (Signed)
1:1 [name] [name] is seen [facility] on the 500 hall today..he remains intrusive, he demonstrates poor impulse control and he gets agitated very easily. HE frequently misinterprets what is said to him and gets angry and starts yelling  But allows different staff to process with him and help   Him Calm  Down again. A His behavior rabnges from normal conversation to yelling, cussing, and vulgar, prejudicial slurrs that he yells out, when he doesn't like what he is being told. R Safety is in place and 1:1 cont.

## 2014-09-10 NOTE — Progress Notes (Signed)
Did not attend group 

## 2014-09-10 NOTE — Progress Notes (Signed)
1:1 Nursing note: Pt resting in bed with eyes closed. Breathing even and unlabored. No signs of distress noted. Will continue to monitor pt. 1:1 monitoring continued for patient safety. Will continue to monitor.

## 2014-09-10 NOTE — ED Notes (Signed)
Sitter at the bedside.

## 2014-09-10 NOTE — Progress Notes (Signed)
Report called to [facility]

## 2014-09-10 NOTE — Progress Notes (Signed)
Pt continues to talk loudly, saying he cannot breathe. Pt continues to get agitated, pt said he was having chest pains, "from where my chest was broke, It's been hurting all day".   Pt was evaluated and assessed and nothing was found. Charge nurse evaluated and she thought pt may need to get further evaluation, due to possible friction rub.

## 2014-09-10 NOTE — ED Notes (Signed)
[facility] transport contacted to [facility]

## 2014-09-10 NOTE — Progress Notes (Addendum)
1:1 note  Pt not on unit. Pt sent to [facility] due to complaints of chest pain. Pt was sent out, pt was in no distress, nor did pt have any Signs/ sx that suggested pt was in any distress.

## 2014-09-10 NOTE — Progress Notes (Signed)
Nursing 1:1 note D:Pt observed sitting in chair talking to nurse. . RR even and unlabored. No distress noted. Pt complaining of chest pains. "If I can't get another nebulizer Tx or my rescue inhaler , then send me to the hospital" . Pt continues to get agitated when does not get what he wants. Pt continues to complain about issues, but his Signs and Sx do not correlate to what's happening.   A: 1:1 observation continues for safety  R: pt remains safe

## 2014-09-10 NOTE — ED Notes (Signed)
Report called toi the rn  At behavorial [facility]. [facility] called to transport the pt back to behavorial

## 2014-09-10 NOTE — Progress Notes (Signed)
Pt informed EMS that he started having chest pains at 1900 tonight. Pt told writer he started having chest pains earlier during the day on 09/09/14.

## 2014-09-10 NOTE — Discharge Instructions (Signed)
Chest Pain (Nonspecific) [name] [name], you were seen today for chest pain. This is likely due to normal postop surgery site. Take pain medication as prescribed and follow-up with your cardiologist and primary care doctor in the next 3 days for continued treatment. If any of your symptoms worsen come back to the emergency department immediately for repeat evaluation. Thank you. It is often hard to give a diagnosis for the cause of chest pain. There is always a chance that your pain could be related to something serious, such as a heart attack or a blood clot in the lungs. You need to follow up with your doctor. HOME CARE  If antibiotic medicine was given, take it as directed by your doctor. Finish the medicine even if you start to feel better.  For the next few days, avoid activities that bring on chest pain. Continue physical activities as told by your doctor.  Do not use any tobacco products. This includes cigarettes, chewing tobacco, and e-cigarettes.  Avoid drinking alcohol.  Only take medicine as told by your doctor.  Follow your doctor's suggestions for more testing if your chest pain does not go away.  Keep all doctor visits you made. GET HELP IF:  Your chest pain does not go away, even after treatment.  You have a rash with blisters on your chest.  You have a fever. GET HELP RIGHT AWAY IF:   You have more pain or pain that spreads to your arm, neck, jaw, back, or belly (abdomen).  You have shortness of breath.  You cough more than usual or cough up blood.  You have very bad back or belly pain.  You feel sick to your stomach (nauseous) or throw up (vomit).  You have very bad weakness.  You pass out (faint).  You have chills. This is an emergency. Do not wait to see if the problems will go away. Call your local emergency services (911 in [place] Do not drive yourself to the hospital. MAKE SURE YOU:   Understand these instructions.  Will watch your condition.  Will  get help right away if you are not doing well or get worse. Document Released: 04/01/2008 Document Revised: 10/19/2013 Document Reviewed: 04/01/2008 [facility] Patient Information 2015 [employer] [place]. This information is not intended to replace advice given to you by your health care provider. Make sure you discuss any questions you have with your health care provider.
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
## 2014-09-11 MED ORDER — IPRATROPIUM-ALBUTEROL 0.5-2.5 (3) MG/3ML IN SOLN
3.0000 mL | Freq: Once | RESPIRATORY_TRACT | Status: AC
  Administered 2014-09-11: 3 mL via RESPIRATORY_TRACT

## 2014-09-11 MED ORDER — IPRATROPIUM-ALBUTEROL 0.5-2.5 (3) MG/3ML IN SOLN: 3.0000 mL | RESPIRATORY_TRACT | Status: DC

## 2014-09-11 MED ORDER — LORATADINE 10 MG PO TABS
10.0000 mg | ORAL_TABLET | Freq: Every day | ORAL | Status: DC | PRN
  Administered 2014-09-11: 10 mg via ORAL
  Filled 2014-09-11: qty 1

## 2014-09-11 MED ORDER — IPRATROPIUM-ALBUTEROL 0.5-2.5 (3) MG/3ML IN SOLN
3.0000 mL | Freq: Once | RESPIRATORY_TRACT | Status: AC
  Administered 2014-09-11: 3 mL via RESPIRATORY_TRACT
  Filled 2014-09-11: qty 3

## 2014-09-11 NOTE — Progress Notes (Signed)
Pt came to nurse requesting nebulizer Tx. Pt stated that he was going to get one from here or get one from the hospital. Pt continues to be rude , disrespectful, and closed minded.  Per NP on duty, a 1x nebulizer Tx was ordered.
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
## 2014-09-11 NOTE — Progress Notes (Signed)
1:1 Nursing note: Pt in hallway talking with staff members. Pt intrusive and demanding but redirectable. 1:1 monitoring continued. Will continue to monitor pt.
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
## 2014-09-11 NOTE — Progress Notes (Signed)
Psychoeducational Group Note  Date: 09/11/2014 Time: 1015  Group Topic/Focus:  Making Healthy Choices:   The focus of this group is to help patients identify negative/unhealthy choices they were using prior to admission and identify positive/healthier coping strategies to replace them upon discharge.  Participation Level:  Minimal  Participation Quality:  Attentive  Affect:  Appropriate  Cognitive:  Confused  Insight:  Improving  Engagement in Group:  Limited  Additional Comments:    09/11/2014,4:16 PM Ralf Konopka, Joie BimlerPatricia Lynn

## 2014-09-11 NOTE — Progress Notes (Signed)
D Pt cont on a 1:1. He is observed touching staff members on the arm and back...lightly with his hand, when he passes by them in the hall. HE says " I know I'm not supposed to do that....". He is loud. HE interrupts others' conversations.   A He completed his self assessment  this morning and on it he wrote he denied SI within the past 24 hrs and he rated his depression, hopelessness and anxiety "0/0/0".   R Safeyt in place and 1: cont.

## 2014-09-11 NOTE — Progress Notes (Signed)
1:1 Nursing note: Pt received pain medication for headache. Has been awake for a while and currently taking a shower. 1:1 monitoring continued for pt safety. Q15 min safety checks maintained. Will continue to monitor pt.

## 2014-09-11 NOTE — Progress Notes (Signed)
Nursing 1:1 note D:Pt observed sitting in chair in room. RR even and unlabored. No distress noted.Pt stated he would be going to bed soon. Pt continues to appear delusional, stating he has bought 3 corvettes and will be purchasing another one tomorrow. Pt mood continues to be very labile and tangential. Pt continues to show disrespect to other people, with foul language and disrespectful gestures.  A: 1:1 observation continues for safety  R: pt remains safe

## 2014-09-11 NOTE — Progress Notes (Signed)
D Pt remains sexually inappropriate, he is loud, intrusive and  Requires staff redirection frequently.   A Pt is manic, compliant with his meds and making slow progress.   R Safety in place.

## 2014-09-11 NOTE — Progress Notes (Signed)
D Pt remains on 1:1, being intrusive, loud and inappropriate in pseech and behavior.   A HE does take his meds as scheduled.   R Safety in place.

## 2014-09-12 DIAGNOSIS — I2584 Coronary atherosclerosis due to calcified coronary lesion: Secondary | ICD-10-CM

## 2014-09-12 DIAGNOSIS — I2510 Atherosclerotic heart disease of native coronary artery without angina pectoris: Secondary | ICD-10-CM | POA: Insufficient documentation

## 2014-09-12 DIAGNOSIS — F302 Manic episode, severe with psychotic symptoms: Secondary | ICD-10-CM | POA: Insufficient documentation

## 2014-09-12 MED ORDER — TRAZODONE HCL 50 MG PO TABS: 50.0000 mg | ORAL_TABLET | Freq: Every evening | ORAL | Status: DC | PRN

## 2014-09-12 MED ORDER — DIVALPROEX SODIUM ER 500 MG PO TB24: 1000.0000 mg | ORAL_TABLET | Freq: Every evening | ORAL | Status: DC

## 2014-09-12 MED ORDER — FERROUS SULFATE 325 (65 FE) MG PO TBEC: 325.0000 mg | DELAYED_RELEASE_TABLET | Freq: Three times a day (TID) | ORAL | Status: DC

## 2014-09-12 MED ORDER — CLOTRIMAZOLE 1 % EX CREA: TOPICAL_CREAM | Freq: Two times a day (BID) | CUTANEOUS | Status: DC

## 2014-09-12 MED ORDER — FLUTICASONE-SALMETEROL 500-50 MCG/DOSE IN AEPB
1.0000 | INHALATION_SPRAY | Freq: Two times a day (BID) | RESPIRATORY_TRACT | Status: DC
Start: 2014-09-12 — End: 2014-09-20

## 2014-09-12 MED ORDER — MOMETASONE FURO-FORMOTEROL FUM 200-5 MCG/ACT IN AERO: 2.0000 | INHALATION_SPRAY | Freq: Two times a day (BID) | RESPIRATORY_TRACT | Status: DC

## 2014-09-12 MED ORDER — TAMSULOSIN HCL 0.4 MG PO CAPS: 0.4000 mg | ORAL_CAPSULE | Freq: Every day | ORAL | Status: DC

## 2014-09-12 MED ORDER — IPRATROPIUM-ALBUTEROL 0.5-2.5 (3) MG/3ML IN SOLN: 3.0000 mL | RESPIRATORY_TRACT | Status: DC | PRN

## 2014-09-12 MED ORDER — GABAPENTIN 100 MG PO CAPS: 200.0000 mg | ORAL_CAPSULE | Freq: Three times a day (TID) | ORAL | Status: DC

## 2014-09-12 MED ORDER — ALBUTEROL SULFATE HFA 108 (90 BASE) MCG/ACT IN AERS: 2.0000 | INHALATION_SPRAY | Freq: Four times a day (QID) | RESPIRATORY_TRACT | Status: DC | PRN

## 2014-09-12 MED ORDER — ATORVASTATIN CALCIUM 40 MG PO TABS: 40.0000 mg | ORAL_TABLET | Freq: Every day | ORAL | Status: DC

## 2014-09-12 MED ORDER — FUROSEMIDE 40 MG PO TABS: 40.0000 mg | ORAL_TABLET | Freq: Every day | ORAL | Status: DC

## 2014-09-12 MED ORDER — FLUTICASONE-SALMETEROL 500-50 MCG/DOSE IN AEPB: 1.0000 | INHALATION_SPRAY | Freq: Two times a day (BID) | RESPIRATORY_TRACT | Status: DC

## 2014-09-12 MED ORDER — ASPIRIN 81 MG PO TABS: 81.0000 mg | ORAL_TABLET | Freq: Every day | ORAL | Status: DC

## 2014-09-12 NOTE — Plan of Care (Signed)
Problem: Ineffective individual coping Goal: STG: Patient will participate in after care plan Patient will attend attend groups and engage in discussion. Outpatient follow up appointment will be scheduled.  Burnis Medin Hodnett, LCSW 09/06/2014 11:39 AM  Pt plans to return home, follow up outpt. Goal met. R Jacori Mulrooney LCSW 09/12/2014 3:05 PM   Outcome: Completed/Met Date Met:  09/12/14

## 2014-09-12 NOTE — Progress Notes (Signed)
[facility] :  Will you be returning to the same living situation after discharge: Yes,  back [place] in [place] At discharge, do you have transportation home?:Yes,  states someone is bringing his truck from [employer] [place] Do you have the ability to pay for your medications:Yes,  [facility]  Release of information consent forms completed and in the chart;  Patient's signature needed at discharge.  Patient to Follow up at: Follow-up Information    Follow up with [facility] On 09/15/2014.   Why:  Go to the walk-in clinc on Thursday morning at 9:30 for your hospital follow up appointment   Contact information:   [address] [place]  [phone]      [facility]   Why:  Follow up with you Dr at your earliest convenience      Patient denies SI/HI:   Yes,  yes    Safety Planning and Suicide Prevention discussed:  Yes,  yes  Ida Rogueorth, Mordecai Tindol B 09/12/2014, 11:26 AM
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
## 2014-09-12 NOTE — Progress Notes (Signed)
Patient lying in bed asleep with eyes closed, respirations even and unlabored. No distress noted, safety maintained on unit with sitter at patients bedside. 1:1 continues and patient is safe.

## 2014-09-12 NOTE — Progress Notes (Signed)
Patient ID: [name] [name] Sheets, male   DOB: 07/24/1946, 68 y.o.   MRN: [id number]  Pt. Denies SI/HI and A/V hallucinations to this Clinical research associatewriter. Belongings returned to patient at time of discharge. Patient denies any new onset of pain or discomfort. Discharge instructions and medications were reviewed with patient. Patient verbalized understanding of both medications and discharge instructions. Q15 minute safety checks maintained until discharge. No distress noted upon discharge.

## 2014-09-12 NOTE — Progress Notes (Signed)
[facility] Post 1:1 Observation Documentation  For the first (8) hours following discontinuation of 1:1 precautions, a progress note entry by nursing staff should be documented at least every 2 hours, reflecting the patient's behavior, condition, mood, and conversation.  Use the progress notes for additional entries.  Time 1:1 discontinued:  0945  Patient's Behavior:  Patient is calm and sitting on the floor at this time.  Patient's Condition:  No distress noted. Patient's respirations are even and unlabored at this time.  Patient's Conversation:  No conversation at this time.  Marzetta BoardDopson, Ani Deoliveira E 09/12/2014, 11:43 AM

## 2014-09-12 NOTE — Plan of Care (Signed)
Problem: Alteration in thought process Goal: LTG-Patient behavior demonstrates decreased signs psychosis Goal not met. Patient is endorsing A/H. He will report being decreased frequency of A/H or return to baseline prior to discharge.   Burnis Medin Hodnett, LCSW 09/06/2014  No signs nor symptoms of psychosis. Still delusional, but at baseline. Goal met. R Ambree Frances LCSW 09/12/2014 3:07 PM     Outcome: Completed/Met Date Met:  09/12/14

## 2014-09-12 NOTE — Progress Notes (Signed)
Patient sitting in the dayroom watching tv and talking to his sitter. Patient is still delusional and grandiose. Patient ask if writer is staying for his 8 am meeting. Writer explains to him that shift change will be taking place soon and he reports that he will pay my salary for the day if I stay. Patient remains safe with sitter at his side, 1:1 continues.

## 2014-09-12 NOTE — Progress Notes (Signed)
[facility] Post 1:1 Observation Documentation  For the first (8) hours following discontinuation of 1:1 precautions, a progress note entry by nursing staff should be documented at least every 2 hours, reflecting the patient's behavior, condition, mood, and conversation.  Use the progress notes for additional entries.  Time 1:1 discontinued:  0945  Patient's Behavior:   Patient is calm at this time.  Patient's Condition:  No distress noted. Respirations are even and unlabored at this time.  Patient's Conversation:  No conversation at this time.  Marzetta BoardDopson, Oshae Simmering E 09/12/2014, 9:44 AM

## 2014-09-12 NOTE — Progress Notes (Signed)
Report received from M. [employer] RN. Writer entered patients room and observed him sitting in the chair eating pretzels. He reports that he plans to prepare for bed after finishing his snack. Patient voiced no complaints, no signs of distress noted. Safety maintained with sitter at patients side. 1:1 continues and patient is safe.

## 2014-09-12 NOTE — [facility] Suicide Risk Assessment (Signed)
   Demographic Factors:  Male and Caucasian  Total Time spent with patient: 45 minutes  Psychiatric Specialty Exam: Physical Exam  ROS  Blood pressure 117/82, pulse 81, temperature 97.8 F (36.6 C), temperature source Oral, resp. rate 18, height 5\' 11"  (1.803 m), weight 75.751 kg (167 lb), SpO2 97 %.Body mass index is 23.3 kg/(m^2).  General Appearance: Casual  Eye Contact::  Fair  Speech:  Clear and Coherent  Volume:  Normal  Mood:  Euthymic  Affect:  Appropriate  Thought Process:  more organized  Orientation:  Full (Time, Place, and Person)  Thought Content:  continues to have delusions that are chronic   Suicidal Thoughts:  No  Homicidal Thoughts:  No  Memory:  Immediate;   Fair Recent;   Fair Remote;   Fair  Judgement:  Fair  Insight:  Shallow  Psychomotor Activity:  Normal  Concentration:  Fair  Recall:  [employer]  Fund of Knowledge:Fair  Language: Fair  Akathisia:  No  Handed:  Right  AIMS (if indicated):     Assets:  Communication Skills  Sleep:  Number of Hours: 4.25    Musculoskeletal: Strength & Muscle Tone: within normal limits Gait & Station: normal Patient leans: N/A   Mental Status Per Nursing Assessment::   On Admission:     Current Mental Status by Physician: NA  Loss Factors: NA  Historical Factors: Impulsivity  Risk Reduction Factors:   Religious beliefs about death and Positive social support  Continued Clinical Symptoms:  Previous Psychiatric Diagnoses and Treatments Medical Diagnoses and Treatments/Surgeries  Cognitive Features That Contribute To Risk:  Polarized thinking    Suicide Risk:  Minimal: No identifiable suicidal ideation.    Discharge Diagnoses: DSM 5: Bipolar Disorder type ,recurrent ,severe ,Manic with psychotic features ( Improving)   Past Medical History  Diagnosis Date  . Severe aortic valve stenosis     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  . COPD (chronic obstructive pulmonary disease)   .  Thoracic ascending aortic aneurysm     [facility]  . COPD (chronic obstructive pulmonary disease)   . BPH (benign prostatic hyperplasia)   . Pacemaker     [facility]  . SSS (sick sinus syndrome)     s/p PPM   . CAD (coronary artery disease)     ?MI in 2007  . Pneumothorax     traumatic 2007  . Anxiety and depression     Plan Of Care/Follow-up recommendations:  Activity:  no restrictions Diet: 2 gram sodium diet  Is patient on multiple antipsychotic therapies at discharge:  No   Has Patient had three or more failed trials of antipsychotic monotherapy by history:  No  Recommended Plan for Multiple Antipsychotic Therapies: NA    Kohlton Gilpatrick MD 09/12/2014, 10:00 AM

## 2014-09-12 NOTE — Tx Team (Signed)
  Interdisciplinary Treatment Plan Update   Date Reviewed:  09/12/2014  Time Reviewed:  8:16 AM  Progress in Treatment:   Attending groups: Yes Participating in groups: Yes Taking medication as prescribed: Yes  Tolerating medication: Yes Family/Significant other contact made: Yes  Patient understands diagnosis: Yes  Discussing patient identified problems/goals with staff: Yes  See initial care plan Medical problems stabilized or resolved: Yes Denies suicidal/homicidal ideation: Yes  In tx team Patient has not harmed self or others: Yes  For review of initial/current patient goals, please see plan of care.  Estimated Length of Stay:  D/C today  Reason for Continuation of Hospitalization:   New Problems/Goals identified:  N/A  Discharge Plan or Barriers:   [facility], [facility]  Additional Comments:  Attendees:  Signature: Ivin BootySarama Eappen, MD 09/12/2014 8:16 AM   Signature: Richelle Itood Brittnie Lewey, LCSW 09/12/2014 8:16 AM  Signature: 09/12/2014 8:16 AM  Signature: Marzetta Boardhrista Dopson, RN 09/12/2014 8:16 AM  Signature:  09/12/2014 8:16 AM  Signature:  09/12/2014 8:16 AM  Signature:   09/12/2014 8:16 AM  Signature:    Signature:    Signature:    Signature:    Signature:    Signature:      Scribe for Treatment Team:   [employer]od Lajeana Strough, LCSW  09/12/2014 8:16 AM

## 2014-09-14 ENCOUNTER — Emergency Department (HOSPITAL_COMMUNITY): Payer: Medicare Other

## 2014-09-14 ENCOUNTER — Other Ambulatory Visit: Payer: Self-pay

## 2014-09-14 ENCOUNTER — Encounter (HOSPITAL_COMMUNITY): Payer: Self-pay | Admitting: Emergency Medicine

## 2014-09-14 ENCOUNTER — Emergency Department (HOSPITAL_COMMUNITY)
Admission: EM | Admit: 2014-09-14 | Discharge: 2014-09-15 | Disposition: A | Discharge status: Home / Self Care | Payer: Medicare Other | Attending: Emergency Medicine | Admitting: Emergency Medicine

## 2014-09-14 DIAGNOSIS — J449 Chronic obstructive pulmonary disease, unspecified: Secondary | ICD-10-CM | POA: Diagnosis not present

## 2014-09-14 DIAGNOSIS — Z79899 Other long term (current) drug therapy: Secondary | ICD-10-CM | POA: Insufficient documentation

## 2014-09-14 DIAGNOSIS — I2510 Atherosclerotic heart disease of native coronary artery without angina pectoris: Secondary | ICD-10-CM | POA: Insufficient documentation

## 2014-09-14 DIAGNOSIS — F319 Bipolar disorder, unspecified: Secondary | ICD-10-CM | POA: Insufficient documentation

## 2014-09-14 DIAGNOSIS — Z7951 Long term (current) use of inhaled steroids: Secondary | ICD-10-CM | POA: Insufficient documentation

## 2014-09-14 DIAGNOSIS — Z7982 Long term (current) use of aspirin: Secondary | ICD-10-CM | POA: Insufficient documentation

## 2014-09-14 DIAGNOSIS — Z9889 Other specified postprocedural states: Secondary | ICD-10-CM | POA: Insufficient documentation

## 2014-09-14 DIAGNOSIS — R079 Chest pain, unspecified: Secondary | ICD-10-CM | POA: Diagnosis not present

## 2014-09-14 DIAGNOSIS — Z87891 Personal history of nicotine dependence: Secondary | ICD-10-CM | POA: Diagnosis not present

## 2014-09-14 DIAGNOSIS — Z950 Presence of cardiac pacemaker: Secondary | ICD-10-CM | POA: Diagnosis not present

## 2014-09-14 DIAGNOSIS — N400 Enlarged prostate without lower urinary tract symptoms: Secondary | ICD-10-CM | POA: Diagnosis not present

## 2014-09-14 DIAGNOSIS — F419 Anxiety disorder, unspecified: Secondary | ICD-10-CM | POA: Insufficient documentation

## 2014-09-14 HISTORY — DX: Unspecified psychosis not due to a substance or known physiological condition: F29

## 2014-09-14 HISTORY — DX: Bipolar disorder, unspecified: F31.9

## 2014-09-14 MED ORDER — ALBUTEROL SULFATE (2.5 MG/3ML) 0.083% IN NEBU
5.0000 mg | INHALATION_SOLUTION | Freq: Once | RESPIRATORY_TRACT | Status: AC
  Administered 2014-09-14: 5 mg via RESPIRATORY_TRACT
  Filled 2014-09-14: qty 6

## 2014-09-14 MED ORDER — IBUPROFEN 800 MG PO TABS
ORAL_TABLET | ORAL | Status: AC
  Administered 2014-09-15: 800 mg via ORAL
  Filled 2014-09-14: qty 1

## 2014-09-14 NOTE — ED Provider Notes (Signed)
CSN: [id number]     Arrival date & time 09/14/14  2251 History  This chart was scribe for Wynetta Fines, MD by Judithann Sauger, ED Scribe. The patient was seen in room APA11/APA11 and the patient's care was started at 11:17 PM.    Chief Complaint  Patient presents with  . Chest Pain    Patient is a 68 y.o. male presenting with chest pain.  Chest Pain  HPI Comments: Level 5 Caveat: psychosis. [name] [name] is a 68 y.o. male with a history of psychosis. He was seen in the emergency department hereon the fifth of this month and had a prolonged ED psychiatric hold. He was subsequently admitted to [facility] and was released 2 days ago with a diagnosis of bipolar disorder, recurrent, severe, manic with psychotic features. His discharge summary then states he had a normal mood and affect. His behavior was reported to be normal and his judgment and thought content were reported to be normal.  The pt reports of a left, sharp, well localized left parasternal chest pain onset 5 hours ago. Pain is worse with coughing or palpation. He states that the pain sometimes moves from his left chest to his right right chest but is currently on the left side. He initially rated the pain as a 10 out of 10, then later as an 11 or 12 out of 10. He also complains of a lower back pain radiating to his left leg. He has shortness of breath due to his to COPD but has been self-medicating with his nebulizer machine with relief. He is requesting another neb treatment at the present time.   The patient's speech is rambling and it is difficult to focus him on topic. He states he makes $5000 a day and, although he is homeless, has 8 or 9 houses [place]. He states he uses "[place]" marijuana under a medical license and any attempt to arrest him for possession would be met with laughter. He is friendly and cooperative and not making any threatening or sexually suggestive comments at this  time.  Past Medical History  Diagnosis Date  . Severe aortic valve stenosis     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 [facility]  . COPD (chronic obstructive pulmonary disease)   . Thoracic ascending aortic aneurysm     repaired 06/2014 at [facility]  . COPD (chronic obstructive pulmonary disease)   . BPH (benign prostatic hyperplasia)   . Pacemaker     battery replaced 06/2014 at [facility]  . SSS (sick sinus syndrome)     s/p PPM   . CAD (coronary artery disease)     ?MI in 2007  . Pneumothorax     traumatic 2007  . Anxiety and depression   . Psychosis   . Bipolar disorder    Past Surgical History  Procedure Laterality Date  . Cardiac surgery      ascending thoracic aneurysm repair 06/2014  . Pacemaker placement  2007    battery replaced 07/04/2014  . Hiatal hernia repair    . Knee surgery      L5-6  . Lumbar fusion    . Aortic valve replacement      porcine valve   Family History  Problem Relation Age of Onset  . CAD Father   . Diabetes Father   . High blood pressure Brother   . Cancer Brother    History  Substance Use Topics  . Smoking status: Former Research scientist (life sciences)  .  Smokeless tobacco: Not on file  . Alcohol Use: Yes    Review of Systems  Unable to perform ROS   Allergies  Tiotropium bromide monohydrate  Home Medications   Prior to Admission medications   Medication Sig Start Date End Date Taking? Authorizing Provider  albuterol (VENTOLIN HFA) 108 (90 BASE) MCG/ACT inhaler Inhale 2 puffs into the lungs every 6 (six) hours as needed for wheezing or shortness of breath. 09/12/14   [place], NP  aspirin 81 MG tablet Take 1 tablet (81 mg total) by mouth daily. 09/12/14   [place], NP  atorvastatin (LIPITOR) 40 MG tablet Take 1 tablet (40 mg total) by mouth daily. 09/12/14   [place], NP  clotrimazole (LOTRIMIN) 1 % cream Apply topically 2 (two) times daily. 09/12/14   [place], NP  divalproex (DEPAKOTE ER) 500 MG 24 hr tablet Take 2  tablets (1,000 mg total) by mouth every evening. 09/12/14   Freda Munro May Agustin, NP  ferrous sulfate 325 (65 FE) MG EC tablet Take 1 tablet (325 mg total) by mouth 3 (three) times daily with meals. 09/12/14   [place], NP  Fluticasone-Salmeterol (ADVAIR) 500-50 MCG/DOSE AEPB Inhale 1 puff into the lungs 2 (two) times daily. 09/12/14   [place], NP  furosemide (LASIX) 40 MG tablet Take 1 tablet (40 mg total) by mouth daily. 09/12/14   [place], NP  gabapentin (NEURONTIN) 100 MG capsule Take 2 capsules (200 mg total) by mouth 3 (three) times daily. 09/12/14   [place], NP  ipratropium-albuterol (DUONEB) 0.5-2.5 (3) MG/3ML SOLN Take 3 mLs by nebulization every 4 (four) hours as needed. 09/12/14   [place], NP  mometasone-formoterol [facility]) 200-5 MCG/ACT AERO Inhale 2 puffs into the lungs 2 (two) times daily. 09/12/14   Freda Munro May Agustin, NP  tamsulosin (FLOMAX) 0.4 MG CAPS capsule Take 1 capsule (0.4 mg total) by mouth daily after supper. 09/12/14   Freda Munro May Agustin, NP  traZODone (DESYREL) 50 MG tablet Take 1 tablet (50 mg total) by mouth at bedtime as needed for sleep. 09/12/14   Freda Munro May Agustin, NP   BP 104/65 mmHg  Pulse 98  Temp(Src) 98.1 F (36.7 C) (Oral)  Resp 23  Ht $R'5\' 10"'Hx$  (1.778 m)  Wt 165 lb (74.844 kg)  BMI 23.68 kg/m2  SpO2 96%   Physical Exam General: Well-developed, well-nourished male in no acute distress; appearance consistent with age of record HENT: normocephalic; atraumatic Eyes: pupils equal, round and reactive to light; extraocular muscles intact Neck: supple Heart: regular rate and rhythm; distant sounds Lungs: decreased air movement bilaterally; frequent rattly cough Chest: well-healed midline sternotomy scar; pacemaker left upper chest; point tenderness left lower parasternal region which reproduces the pain of the chief complaint Abdomen: soft; nondistended; nontender; no masses or hepatosplenomegaly; bowel  sounds present Extremities: No deformity; full range of motion; pulses normal Neurologic: Awake, alert; motor function intact in all extremities and symmetric; no facial droop Skin: Warm and dry Psychiatric: rambling, circumstantial speech; delusions of grandeur  ED Course  Procedures (including critical care time) DIAGNOSTIC STUDIES: Oxygen Saturation is 93% on RA, low by my interpretation.    COORDINATION OF CARE: 11:27 PM- Pt advised of plan for treatment and pt agrees.     EKG Interpretation   Date/Time:  Wednesday September 14 2014 23:00:08 EST Ventricular Rate:  98 PR Interval:  169 QRS Duration: 160 QT Interval:  413 QTC Calculation: 527 R Axis:   -  81 Text Interpretation:  Atrial-sensed ventricular-paced rhythm No further  analysis attempted due to paced rhythm Pacer spikes clearly present  Confirmed by Lindie Roberson  MD, Jonny Ruiz ([id number]) on 09/14/2014 11:11:00 PM      MDM  Nursing notes and vitals signs, including pulse oximetry, reviewed.  Summary of this visit's results, reviewed by myself:  Labs:  Results for orders placed or performed during the hospital encounter of 09/14/14 (from the past 24 hour(s))  CBC with Differential     Status: Abnormal   Collection Time: 09/14/14 11:48 PM  Result Value Ref Range   WBC 14.6 (H) 4.0 - 10.5 K/uL   RBC 3.90 (L) 4.22 - 5.81 MIL/uL   Hemoglobin 11.7 (L) 13.0 - 17.0 g/dL   HCT 74.4 (L) 51.4 - 60.4 %   MCV 91.0 78.0 - 100.0 fL   MCH 30.0 26.0 - 34.0 pg   MCHC 33.0 30.0 - 36.0 g/dL   RDW 79.9 (H) 87.2 - 15.8 %   Platelets 247 150 - 400 K/uL   Neutrophils Relative % 74 43 - 77 %   Lymphocytes Relative 18 12 - 46 %   Monocytes Relative 8 3 - 12 %   Eosinophils Relative 0 0 - 5 %   Basophils Relative 0 0 - 1 %   Neutro Abs 10.8 (H) 1.7 - 7.7 K/uL   Lymphs Abs 2.6 0.7 - 4.0 K/uL   Monocytes Absolute 1.2 (H) 0.1 - 1.0 K/uL   Eosinophils Absolute 0.0 0.0 - 0.7 K/uL   Basophils Absolute 0.0 0.0 - 0.1 K/uL   WBC Morphology ATYPICAL  LYMPHOCYTES   Basic metabolic panel     Status: Abnormal   Collection Time: 09/14/14 11:48 PM  Result Value Ref Range   Sodium 140 137 - 147 mEq/L   Potassium 3.9 3.7 - 5.3 mEq/L   Chloride 100 96 - 112 mEq/L   CO2 29 19 - 32 mEq/L   Glucose, Bld 127 (H) 70 - 99 mg/dL   BUN 21 6 - 23 mg/dL   Creatinine, Ser 7.27 0.50 - 1.35 mg/dL   Calcium 9.0 8.4 - 61.8 mg/dL   GFR calc non Af Amer 75 (L) >90 mL/min   GFR calc Af Amer 87 (L) >90 mL/min   Anion gap 11 5 - 15  Ethanol     Status: None   Collection Time: 09/14/14 11:48 PM  Result Value Ref Range   Alcohol, Ethyl (B) <11 0 - 11 mg/dL  Valproic acid level     Status: Abnormal   Collection Time: 09/14/14 11:48 PM  Result Value Ref Range   Valproic Acid Lvl <10.0 (L) 50.0 - 100.0 ug/mL  I-stat troponin, ED     Status: None   Collection Time: 09/15/14 12:10 AM  Result Value Ref Range   Troponin i, poc 0.02 0.00 - 0.08 ng/mL   Comment 3            Imaging Studies: Dg Chest 2 View  09/15/2014   CLINICAL DATA:  Chest pain  EXAM: CHEST  2 VIEW  COMPARISON:  09/01/2014  FINDINGS: Cardiac shadow is stable. Valve replacement and pacemaker device are again noted. The lungs are hyperinflated consistent with COPD. No focal infiltrate  IMPRESSION: COPD without acute abnormality.   Electronically Signed   By: Alcide Clever M.D.   On: 09/15/2014 00:29   1:53 AM Patient insisting on leaving.I find no indication at this time for involuntary commitment.  I personally performed the services described  in this documentation, which was scribed in my presence. The recorded information has been reviewed and is accurate.    Wynetta Fines, MD 09/15/14 [phone]

## 2014-09-14 NOTE — ED Notes (Signed)
Patient reports chest pain that started tonight along with shortness of breath.

## 2014-09-15 LAB — CBC WITH DIFFERENTIAL/PLATELET
Basophils Absolute: 0 10*3/uL (ref 0.0–0.1)
Basophils Relative: 0 % (ref 0–1)
Eosinophils Absolute: 0 10*3/uL (ref 0.0–0.7)
Eosinophils Relative: 0 % (ref 0–5)
HCT: 35.5 % — ABNORMAL LOW (ref 39.0–52.0)
Hemoglobin: 11.7 g/dL — ABNORMAL LOW (ref 13.0–17.0)
Lymphocytes Relative: 18 % (ref 12–46)
Lymphs Abs: 2.6 10*3/uL (ref 0.7–4.0)
MCH: 30 pg (ref 26.0–34.0)
MCHC: 33 g/dL (ref 30.0–36.0)
MCV: 91 fL (ref 78.0–100.0)
Monocytes Absolute: 1.2 10*3/uL — ABNORMAL HIGH (ref 0.1–1.0)
Monocytes Relative: 8 % (ref 3–12)
Neutro Abs: 10.8 10*3/uL — ABNORMAL HIGH (ref 1.7–7.7)
Neutrophils Relative %: 74 % (ref 43–77)
Platelets: 247 10*3/uL (ref 150–400)
RBC: 3.9 MIL/uL — ABNORMAL LOW (ref 4.22–5.81)
RDW: 15.6 % — ABNORMAL HIGH (ref 11.5–15.5)
WBC: 14.6 10*3/uL — ABNORMAL HIGH (ref 4.0–10.5)

## 2014-09-15 LAB — VALPROIC ACID LEVEL: Valproic Acid Lvl: <10 ug/mL — ABNORMAL LOW (ref 50.0–100.0)

## 2014-09-15 LAB — BASIC METABOLIC PANEL
Anion gap: 11 (ref 5–15)
BUN: 21 mg/dL (ref 6–23)
CO2: 29 mEq/L (ref 19–32)
Calcium: 9 mg/dL (ref 8.4–10.5)
Chloride: 100 mEq/L (ref 96–112)
Creatinine, Ser: 1 mg/dL (ref 0.50–1.35)
GFR calc Af Amer: 87 mL/min — ABNORMAL LOW (ref >90)
GFR calc non Af Amer: 75 mL/min — ABNORMAL LOW (ref >90)
Glucose, Bld: 127 mg/dL — ABNORMAL HIGH (ref 70–99)
Potassium: 3.9 mEq/L (ref 3.7–5.3)
Sodium: 140 mEq/L (ref 137–147)

## 2014-09-15 LAB — I-STAT TROPONIN, ED: Troponin i, poc: 0.02 ng/mL (ref 0.00–0.08)

## 2014-09-15 LAB — ETHANOL: Alcohol, Ethyl (B): <11 mg/dL (ref 0–11)

## 2014-09-15 MED ORDER — ALBUTEROL SULFATE HFA 108 (90 BASE) MCG/ACT IN AERS
2.0000 | INHALATION_SPRAY | RESPIRATORY_TRACT | Status: DC | PRN
Start: 2014-09-15 — End: 2014-09-15
  Filled 2014-09-15: qty 6.7

## 2014-09-15 MED ORDER — IBUPROFEN 800 MG PO TABS
800.0000 mg | ORAL_TABLET | Freq: Once | ORAL | Status: AC
  Administered 2014-09-15: 800 mg via ORAL

## 2014-09-15 NOTE — Progress Notes (Signed)
Patient Discharge Instructions:  After Visit Summary (AVS):   Faxed to:  09/15/14 Discharge Summary Note:   Faxed to:  09/15/14 Psychiatric Admission Assessment Note:   Faxed to:  09/15/14 Suicide Risk Assessment - Discharge Assessment:   Faxed to:  09/15/14 Faxed/Sent to the Next Level Care provider:  09/15/14  Faxed to [facility] @ [phone] Faxed to [facility] @ [phone]  Jerelene ReddenSheena E [place], 09/15/2014, 1:08 PM

## 2014-09-15 NOTE — Discharge Instructions (Signed)

## 2014-09-15 NOTE — ED Notes (Signed)
Pt gone to xray

## 2014-09-18 ENCOUNTER — Emergency Department (HOSPITAL_COMMUNITY)
Admission: EM | Admit: 2014-09-18 | Discharge: 2014-09-18 | Disposition: A | Discharge status: Home / Self Care | Payer: Medicare Other | Attending: Emergency Medicine | Admitting: Emergency Medicine

## 2014-09-18 ENCOUNTER — Encounter (HOSPITAL_COMMUNITY): Payer: Self-pay | Admitting: Emergency Medicine

## 2014-09-18 ENCOUNTER — Emergency Department (HOSPITAL_COMMUNITY)
Admission: EM | Admit: 2014-09-18 | Discharge: 2014-09-18 | Discharge status: Against Medical Advice | Payer: Medicare Other | Source: Home / Self Care | Attending: Emergency Medicine | Admitting: Emergency Medicine

## 2014-09-18 DIAGNOSIS — Z7982 Long term (current) use of aspirin: Secondary | ICD-10-CM

## 2014-09-18 DIAGNOSIS — F419 Anxiety disorder, unspecified: Secondary | ICD-10-CM

## 2014-09-18 DIAGNOSIS — Z87891 Personal history of nicotine dependence: Secondary | ICD-10-CM | POA: Insufficient documentation

## 2014-09-18 DIAGNOSIS — Z950 Presence of cardiac pacemaker: Secondary | ICD-10-CM | POA: Insufficient documentation

## 2014-09-18 DIAGNOSIS — F319 Bipolar disorder, unspecified: Secondary | ICD-10-CM | POA: Insufficient documentation

## 2014-09-18 DIAGNOSIS — J441 Chronic obstructive pulmonary disease with (acute) exacerbation: Secondary | ICD-10-CM | POA: Insufficient documentation

## 2014-09-18 DIAGNOSIS — Z9889 Other specified postprocedural states: Secondary | ICD-10-CM | POA: Insufficient documentation

## 2014-09-18 DIAGNOSIS — Z79899 Other long term (current) drug therapy: Secondary | ICD-10-CM | POA: Insufficient documentation

## 2014-09-18 DIAGNOSIS — J438 Other emphysema: Secondary | ICD-10-CM

## 2014-09-18 DIAGNOSIS — R0602 Shortness of breath: Secondary | ICD-10-CM | POA: Diagnosis present

## 2014-09-18 DIAGNOSIS — Z7951 Long term (current) use of inhaled steroids: Secondary | ICD-10-CM | POA: Diagnosis not present

## 2014-09-18 DIAGNOSIS — N400 Enlarged prostate without lower urinary tract symptoms: Secondary | ICD-10-CM | POA: Insufficient documentation

## 2014-09-18 DIAGNOSIS — I2510 Atherosclerotic heart disease of native coronary artery without angina pectoris: Secondary | ICD-10-CM | POA: Insufficient documentation

## 2014-09-18 DIAGNOSIS — R509 Fever, unspecified: Secondary | ICD-10-CM | POA: Insufficient documentation

## 2014-09-18 MED ORDER — ALBUTEROL SULFATE (2.5 MG/3ML) 0.083% IN NEBU: 5.0000 mg | INHALATION_SOLUTION | Freq: Once | RESPIRATORY_TRACT | Status: DC

## 2014-09-18 MED ORDER — IPRATROPIUM BROMIDE 0.02 % IN SOLN
0.5000 mg | Freq: Once | RESPIRATORY_TRACT | Status: AC
  Administered 2014-09-18: 0.5 mg via RESPIRATORY_TRACT
  Filled 2014-09-18: qty 2.5

## 2014-09-18 MED ORDER — ALBUTEROL SULFATE (2.5 MG/3ML) 0.083% IN NEBU
5.0000 mg | INHALATION_SOLUTION | Freq: Once | RESPIRATORY_TRACT | Status: AC
Start: 2014-09-18 — End: 2014-09-18
  Administered 2014-09-18: 5 mg via RESPIRATORY_TRACT
  Filled 2014-09-18: qty 6

## 2014-09-18 MED ORDER — IPRATROPIUM BROMIDE 0.02 % IN SOLN: 0.5000 mg | Freq: Once | RESPIRATORY_TRACT | Status: DC

## 2014-09-18 MED ORDER — ALBUTEROL SULFATE HFA 108 (90 BASE) MCG/ACT IN AERS: 2.0000 | INHALATION_SPRAY | RESPIRATORY_TRACT | Status: DC | PRN

## 2014-09-18 NOTE — ED Notes (Signed)
Patient out to desk stating he is waiting on a breathing treatment. Informed he must wait for MD to evaluate. Patient in no respiratory distress. Speaking rapidly and in full sentences.

## 2014-09-18 NOTE — ED Notes (Signed)
Patient also refusing to put gown on at this time.

## 2014-09-18 NOTE — ED Notes (Signed)
Pt seen in ED earlier today for SOB. Pt states he was given a prescription for an albuterol inhaler but wants a prescription for albuterol nebulizer treatments. Pt speaking in complete sentences. Nad noted.

## 2014-09-18 NOTE — ED Provider Notes (Signed)
CSN: [id number]     Arrival date & time 09/18/14  1039 History   First MD Initiated Contact with Patient 09/18/14 1108    This chart was scribed for No att. providers found by Marica OtterNusrat Rahman, ED Scribe. This patient was seen in room APA08/APA08 and the patient's care was started at 11:40 AM.  Chief Complaint  Patient presents with  . Shortness of Breath   The history is provided by the patient. No language interpreter was used.   PCP: No PCP Per Patient HPI Comments: [name] [name] is a 68 y.o. male, with medical Hx noted below and significant for COPD, pacemaker placement, daily tobacco use (0.25 ppd), CAD, who presents to the Emergency Department complaining of SOB onset yesterday morning. Pt complains of associated subjective fever, chills, productive cough with yellow/brown sputum, and wheezing. Pt denies rhinorrhea, v/d, chest pain or any other Sx. Pt denies any medicinal allergies and specifically states that he does not have an allergy to Tiotropium bromide monohydrate.   PC[place] Dr [place]acinth MeekerMiller in [place]  Past Medical History  Diagnosis Date  . Severe aortic valve stenosis     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  . COPD (chronic obstructive pulmonary disease)   . Thoracic ascending aortic aneurysm     [facility]  . COPD (chronic obstructive pulmonary disease)   . BPH (benign prostatic hyperplasia)   . Pacemaker     [facility]  . SSS (sick sinus syndrome)     s/p PPM   . CAD (coronary artery disease)     ?MI in 2007  . Pneumothorax     traumatic 2007  . Anxiety and depression   . Psychosis   . Bipolar disorder    Past Surgical History  Procedure Laterality Date  . Cardiac surgery      ascending thoracic aneurysm repair 06/2014  . Pacemaker placement  2007    battery replaced 07/04/2014  . Hiatal hernia repair    . Knee surgery      L5-6  . Lumbar fusion    . Aortic valve replacement      porcine valve   Family History   Problem Relation Age of Onset  . CAD Father   . Diabetes Father   . High blood pressure Brother   . Cancer Brother    History  Substance Use Topics  . Smoking status: Former Games developermoker  . Smokeless tobacco: Not on file  . 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  HENT: Negative for rhinorrhea.   Respiratory: Positive for cough, shortness of breath and wheezing.   Cardiovascular: Negative for chest pain.  Gastrointestinal: Negative for vomiting and diarrhea.  Psychiatric/Behavioral: Negative for confusion.  All other systems reviewed and are negative.     Allergies  Tiotropium bromide monohydrate  Home Medications   Prior to Admission medications   Medication Sig Start Date End Date Taking? Authorizing Provider  albuterol (VENTOLIN HFA) 108 (90 BASE) MCG/ACT inhaler Inhale 2 puffs into the lungs every 6 (six) hours as needed for wheezing or shortness of breath. 09/12/14  Yes Velna HatchetSheila May Agustin, NP  ipratropium-albuterol (DUONEB) 0.5-2.5 (3) MG/3ML SOLN Take 3 mLs by nebulization every 4 (four) hours as needed. Patient taking differently: Take 3 mLs by nebulization every 4 (four) hours as needed. Shortness of breath/wheezing 09/12/14  Yes Velna HatchetSheila May Agustin, NP  albuterol (PROVENTIL HFA;VENTOLIN HFA) 108 (90 BASE) MCG/ACT inhaler Inhale 2 puffs into the lungs every 4 (four) hours as needed. 09/18/14   Ward GivensIva L Maisey Deandrade, MD  aspirin 81 MG tablet Take 1 tablet (81 mg total) by mouth daily. Patient not taking: Reported on 09/18/2014 09/12/14   [facility]  atorvastatin (LIPITOR) 40 MG tablet Take 1 tablet (40 mg total) by mouth daily. Patient not taking: Reported on 09/18/2014 09/12/14   [facility] Agust[facility], NP  clotrimazole (LOTRIMIN) 1 % cream Apply topically 2 (two) times daily. Patient not taking: Reported on 09/18/2014 09/12/14   Velna HatchetSheila May  Agustin, NP  divalproex (DEPAKOTE ER) 500 MG 24 hr tablet Take 2 tablets (1,000 mg total) by mouth every evening. Patient not taking: Reported on 09/18/2014 09/12/14   [facility]  ferrous sulfate 325 (65 FE) MG EC tablet Take 1 tablet (325 mg total) by mouth 3 (three) times daily with meals. Patient not taking: Reported on 09/18/2014 09/12/14   Velna HatchetSheila May Agustin, NP  Fluticasone-Salmeterol (ADVAIR) 500-50 MCG/DOSE AEPB Inhale 1 puff into the lungs 2 (two) times daily. Patient not taking: Reported on 09/18/2014 09/12/14   [facility]  furosemide (LASIX) 40 MG tablet Take 1 tablet (40 mg total) by mouth daily. Patient not taking: Reported on 09/18/2014 09/12/14   [facility]  gabapentin (NEURONTIN) 100 MG capsule Take 2 capsules (200 mg total) by mouth 3 (three) times daily. Patient not taking: Reported on 09/18/2014 09/12/14   [facility]  mometasone-formoterol [facility](DULERA) 200-5 MCG/ACT AERO Inhale 2 puffs into the lungs 2 (two) times daily. Patient not taking: Reported on 09/18/2014 09/12/14   [facility] NP  tamsulosin (FLOMAX) 0.4 MG CAPS capsule Take 1 capsule (0.4 mg total) by mouth daily after supper. Patient not taking: Reported on 09/18/2014 09/12/14   [facility]  traZODone (DESYREL) 50 MG tablet Take 1 tablet (50 mg total) by mouth at bedtime as needed for sleep. Patient not taking: Reported on 09/18/2014 09/12/14   [facility], NP   Triage Vitals: BP 97/74 mmHg  Pulse 99  Temp(Src) 98.4 F (36.9 C) (Oral)  Resp 20  Ht 5\' 11"  (1.803 m)  Wt 168 lb (76.204 kg)  BMI 23.44 kg/m2  SpO2 85%  Vital signs normal except hypoxia which improved in the ED.   Physical Exam  Constitutional: He is oriented to person, place, and time. He appears well-developed and well-nourished.  Non-toxic appearance. He does not appear ill. No distress.  HENT:  Head: Normocephalic and atraumatic.  Right Ear: External ear normal.  Left  Ear: External ear normal.  Nose: Nose normal. No mucosal edema or rhinorrhea.  Mouth/Throat: Oropharynx is clear and moist and mucous membranes are normal. No dental abscesses or uvula swelling.  Eyes: Conjunctivae and EOM are normal. Pupils are equal, round, and reactive to light.  Neck: Normal range of motion and full passive range of motion without pain. Neck supple.  Cardiovascular: Normal rate, regular rhythm and normal heart sounds.  Exam reveals no gallop and no friction rub.   No murmur heard. Pulmonary/Chest: Effort normal. No respiratory distress. He has wheezes. He has no rhonchi. He has rales. He exhibits no tenderness and no crepitus.  Abdominal: Soft. Normal appearance and bowel sounds are normal. He exhibits no distension. There is no tenderness. There is no rebound and no guarding.  Musculoskeletal: Normal range of motion. He  exhibits no edema or tenderness.  Moves all extremities well.   Neurological: He is alert and oriented to person, place, and time. He has normal strength. No cranial nerve deficit.  Skin: Skin is warm, dry and intact. No rash noted. No erythema. No pallor.  Psychiatric: He has a normal mood and affect. His speech is normal and behavior is normal. His mood appears not anxious.  Nursing note and vitals reviewed.   ED Course  Procedures (including critical care time)  Medications  albuterol (PROVENTIL) (2.5 MG/3ML) 0.083% nebulizer solution 5 mg (5 mg Nebulization Given 09/18/14 1152)  ipratropium (ATROVENT) nebulizer solution 0.5 mg (0.5 mg Nebulization Given 09/18/14 1152)    DIAGNOSTIC STUDIES: Oxygen Saturation is 85% on RA, low by my interpretation.    COORDINATION OF CARE: 11:44 AM-Discussed treatment plan which includes meds with pt at bedside and pt agreed to plan.   Recheck after his nebulizer. No more wheezing, just diffuse decreased BS. Wants to be discharged.    Labs Review Labs Reviewed - No data to display  Imaging Review No  results found.   EKG Interpretation None      MDM   Final diagnoses:  Other emphysema    Discharge Medication List as of 09/18/2014 12:24 PM    START taking these medications   Details  !! albuterol (PROVENTIL HFA;VENTOLIN HFA) 108 (90 BASE) MCG/ACT inhaler Inhale 2 puffs into the lungs every 4 (four) hours as needed., Starting 09/18/2014, Until Discontinued, Print     !! - Potential duplicate medications found. Please discuss with provider.      Plan discharge  Devoria Albe, MD, FACEP   I personally performed the services described in this documentation, which was scribed in my presence. The recorded information has been reviewed and considered.  Devoria Albe, MD, Armando Gang    Ward Givens, MD 09/18/14 [phone]

## 2014-09-18 NOTE — Discharge Instructions (Signed)
Use the inhaler as needed. Follow up with your doctor so you don't run out again.

## 2014-09-18 NOTE — ED Notes (Signed)
Patient c/o shortness of breath with productive cough. Patient reports using inhaler last night with no relief. Patient reports that he is out of his nebulizer medication and needs a proscription. Per patient sputum thick green and brown. Denies any fevers or chest pain. Patient refusing EKG.

## 2014-09-18 NOTE — ED Provider Notes (Signed)
CSN: [id number]     Arrival date & time    History   First MD Initiated Contact with Patient 09/18/14 1623     Chief Complaint  Patient presents with  . Shortness of Breath     (Consider location/radiation/quality/duration/timing/severity/associated sxs/prior Treatment) HPI Comments: Patient is a 68 year old male with history of COPD, pacemaker, and psychiatric issues. He presents today with complaints of wheezing and difficulty breathing. He was seen earlier this morning for similar complaints and was prescribed an albuterol inhaler. He returns today stating that he was given the wrong medication. He is requesting albuterol unit doses which she can use in his nebulizer machine. He denies to me that he is feeling any worse than he was this morning. He denies any fevers, chills, or productive cough.  Patient is a 68 y.o. male presenting with shortness of breath. The history is provided by the patient.  Shortness of Breath Severity:  Moderate Onset quality:  Gradual Duration:  5 days Timing:  Constant Progression:  Worsening Chronicity:  Recurrent Context: activity   Relieved by:  Nothing Worsened by:  Nothing tried Ineffective treatments:  None tried   Past Medical History  Diagnosis Date  . Severe aortic valve stenosis     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  . COPD (chronic obstructive pulmonary disease)   . Thoracic ascending aortic aneurysm     [facility]  . COPD (chronic obstructive pulmonary disease)   . BPH (benign prostatic hyperplasia)   . Pacemaker     [facility]  . SSS (sick sinus syndrome)     s/p PPM   . CAD (coronary artery disease)     ?MI in 2007  . Pneumothorax     traumatic 2007  . Anxiety and depression   . Psychosis   . Bipolar disorder    Past Surgical History  Procedure Laterality Date  . Cardiac surgery      ascending thoracic aneurysm repair 06/2014  . Pacemaker placement  2007    battery  replaced 07/04/2014  . Hiatal hernia repair    . Knee surgery      L5-6  . Lumbar fusion    . Aortic valve replacement      porcine valve   Family History  Problem Relation Age of Onset  . CAD Father   . Diabetes Father   . High blood pressure Brother   . Cancer Brother    History  Substance Use Topics  . Smoking status: Former Games developermoker  . Smokeless tobacco: Not on file  . Alcohol Use: Yes    Review of Systems  Respiratory: Positive for shortness of breath.   All other systems reviewed and are negative.     Allergies  Tiotropium bromide monohydrate  Home Medications   Prior to Admission medications   Medication Sig Start Date End Date Taking? Authorizing Provider  albuterol (PROVENTIL HFA;VENTOLIN HFA) 108 (90 BASE) MCG/ACT inhaler Inhale 2 puffs into the lungs every 4 (four) hours as needed. 09/18/14  Yes Ward GivensIva L Knapp, MD  albuterol (VENTOLIN HFA) 108 (90 BASE) MCG/ACT inhaler Inhale 2 puffs into the lungs every 6 (six) hours as needed for wheezing or shortness of breath. Patient not taking: Reported on 09/18/2014 09/12/14   [facility]  aspirin 81 MG tablet Take 1 tablet (81 mg total) by mouth daily. Patient not taking: Reported on 09/18/2014 09/12/14   Velna HatchetSheila May Agustin, NP  atorvastatin (LIPITOR) 40 MG  tablet Take 1 tablet (40 mg total) by mouth daily. Patient not taking: Reported on 09/18/2014 09/12/14   [facility]  clotrimazole (LOTRIMIN) 1 % cream Apply topically 2 (two) times daily. Patient not taking: Reported on 09/18/2014 09/12/14   Velna HatchetSheila May Agustin, NP  divalproex (DEPAKOTE ER) 500 MG 24 hr tablet Take 2 tablets (1,000 mg total) by mouth every evening. Patient not taking: Reported on 09/18/2014 09/12/14   [facility], NP  ferrous sulfate 325 (65 FE) MG EC tablet Take 1 tablet (325 mg total) by mouth 3 (three) times daily with meals. Patient not taking: Reported on 09/18/2014 09/12/14   Velna HatchetSheila May Agustin, NP   Fluticasone-Salmeterol (ADVAIR) 500-50 MCG/DOSE AEPB Inhale 1 puff into the lungs 2 (two) times daily. Patient not taking: Reported on 09/18/2014 09/12/14   [facility]  furosemide (LASIX) 40 MG tablet Take 1 tablet (40 mg total) by mouth daily. Patient not taking: Reported on 09/18/2014 09/12/14   [facility], NP  gabapentin (NEURONTIN) 100 MG capsule Take 2 capsules (200 mg total) by mouth 3 (three) times daily. Patient not taking: Reported on 09/18/2014 09/12/14   [facility]  ipratropium-albuterol (DUONEB) 0.5-2.5 (3) MG/3ML SOLN Take 3 mLs by nebulization every 4 (four) hours as needed. Patient taking differently: Take 3 mLs by nebulization every 4 (four) hours as needed. Shortness of breath/wheezing 09/12/14   [facility], NP  mometasone-formoterol [facility](DULERA) 200-5 MCG/ACT AERO Inhale 2 puffs into the lungs 2 (two) times daily. Patient not taking: Reported on 09/18/2014 09/12/14   [facility], NP  tamsulosin (FLOMAX) 0.4 MG CAPS capsule Take 1 capsule (0.4 mg total) by mouth daily after supper. Patient not taking: Reported on 09/18/2014 09/12/14   [facility], NP  traZODone (DESYREL) 50 MG tablet Take 1 tablet (50 mg total) by mouth at bedtime as needed for sleep. Patient not taking: Reported on 09/18/2014 09/12/14   Velna HatchetSheila May Agustin, NP   BP 105/70 mmHg  Pulse 99  Temp(Src) 98.4 F (36.9 C)  Resp 20  Ht 5\' 8"  (1.727 m)  Wt 168 lb (76.204 kg)  BMI 25.55 kg/m2  SpO2 93% Physical Exam  Constitutional: He is oriented to person, place, and time. He appears well-developed and well-nourished. No distress.  HENT:  Head: Normocephalic and atraumatic.  Mouth/Throat: Oropharynx is clear and moist.  Neck: Normal range of motion. Neck supple.  Cardiovascular: Normal rate, regular rhythm and normal heart sounds.   No murmur heard. Pulmonary/Chest: Effort normal. No respiratory distress. He has wheezes. He has no rales.  There are mild  excretory wheezes bilaterally.  Abdominal: Soft. Bowel sounds are normal. He exhibits no distension. There is no tenderness.  Musculoskeletal: Normal range of motion. He exhibits no edema.  Neurological: He is alert and oriented to person, place, and time.  Skin: Skin is warm and dry. He is not diaphoretic.  Nursing note and vitals reviewed.   ED Course  Procedures (including critical care time) Labs Review Labs Reviewed - No data to display  Imaging Review No results found.   EKG Interpretation None      MDM   Final diagnoses:  None    Patient became angry and verbally abusive towards the ER staff while waiting for his nebulizer treatment. He then requested to be signed out AGAINST MEDICAL ADVICE and left the department.    Geoffery Lyonsouglas Mavis Gravelle, MD 09/18/14 [phone]

## 2014-09-18 NOTE — ED Notes (Signed)
Pt provided with paper scrubs bottoms due to pt with no pants, refused to have door closed for fire drill

## 2014-09-20 ENCOUNTER — Encounter (HOSPITAL_COMMUNITY): Payer: Self-pay | Admitting: Emergency Medicine

## 2014-09-20 ENCOUNTER — Emergency Department (HOSPITAL_COMMUNITY)
Admission: EM | Admit: 2014-09-20 | Discharge: 2014-09-20 | Discharge status: Against Medical Advice | Payer: Medicare Other | Source: Home / Self Care | Attending: Emergency Medicine | Admitting: Emergency Medicine

## 2014-09-20 ENCOUNTER — Emergency Department (HOSPITAL_COMMUNITY)
Admission: EM | Admit: 2014-09-20 | Discharge: 2014-09-21 | Disposition: A | Discharge status: Home / Self Care | Payer: Medicare Other | Attending: Emergency Medicine | Admitting: Emergency Medicine

## 2014-09-20 ENCOUNTER — Emergency Department (HOSPITAL_COMMUNITY): Payer: Medicare Other

## 2014-09-20 DIAGNOSIS — Z950 Presence of cardiac pacemaker: Secondary | ICD-10-CM | POA: Insufficient documentation

## 2014-09-20 DIAGNOSIS — J441 Chronic obstructive pulmonary disease with (acute) exacerbation: Secondary | ICD-10-CM | POA: Insufficient documentation

## 2014-09-20 DIAGNOSIS — Z7951 Long term (current) use of inhaled steroids: Secondary | ICD-10-CM | POA: Insufficient documentation

## 2014-09-20 DIAGNOSIS — Z79899 Other long term (current) drug therapy: Secondary | ICD-10-CM | POA: Insufficient documentation

## 2014-09-20 DIAGNOSIS — Z954 Presence of other heart-valve replacement: Secondary | ICD-10-CM | POA: Insufficient documentation

## 2014-09-20 DIAGNOSIS — I2510 Atherosclerotic heart disease of native coronary artery without angina pectoris: Secondary | ICD-10-CM | POA: Insufficient documentation

## 2014-09-20 DIAGNOSIS — E119 Type 2 diabetes mellitus without complications: Secondary | ICD-10-CM | POA: Insufficient documentation

## 2014-09-20 DIAGNOSIS — F419 Anxiety disorder, unspecified: Secondary | ICD-10-CM | POA: Insufficient documentation

## 2014-09-20 DIAGNOSIS — J449 Chronic obstructive pulmonary disease, unspecified: Secondary | ICD-10-CM | POA: Diagnosis present

## 2014-09-20 DIAGNOSIS — Z9889 Other specified postprocedural states: Secondary | ICD-10-CM | POA: Insufficient documentation

## 2014-09-20 DIAGNOSIS — Z87448 Personal history of other diseases of urinary system: Secondary | ICD-10-CM | POA: Insufficient documentation

## 2014-09-20 DIAGNOSIS — R0602 Shortness of breath: Secondary | ICD-10-CM | POA: Diagnosis not present

## 2014-09-20 DIAGNOSIS — Z7982 Long term (current) use of aspirin: Secondary | ICD-10-CM | POA: Insufficient documentation

## 2014-09-20 DIAGNOSIS — Z87891 Personal history of nicotine dependence: Secondary | ICD-10-CM

## 2014-09-20 DIAGNOSIS — F319 Bipolar disorder, unspecified: Secondary | ICD-10-CM

## 2014-09-20 DIAGNOSIS — Z8679 Personal history of other diseases of the circulatory system: Secondary | ICD-10-CM | POA: Insufficient documentation

## 2014-09-20 LAB — CBG MONITORING, ED: Glucose-Capillary: 116 mg/dL — ABNORMAL HIGH (ref 70–99)

## 2014-09-20 MED ORDER — MOMETASONE FURO-FORMOTEROL FUM 100-5 MCG/ACT IN AERO
2.0000 | INHALATION_SPRAY | Freq: Two times a day (BID) | RESPIRATORY_TRACT | Status: DC
  Administered 2014-09-21: 2 via RESPIRATORY_TRACT
  Filled 2014-09-20: qty 8.8

## 2014-09-20 MED ORDER — IPRATROPIUM-ALBUTEROL 0.5-2.5 (3) MG/3ML IN SOLN
3.0000 mL | Freq: Once | RESPIRATORY_TRACT | Status: AC
  Administered 2014-09-20: 3 mL via RESPIRATORY_TRACT
  Filled 2014-09-20: qty 3

## 2014-09-20 MED ORDER — PREDNISONE 20 MG PO TABS
60.0000 mg | ORAL_TABLET | Freq: Once | ORAL | Status: AC
  Administered 2014-09-20: 60 mg via ORAL
  Filled 2014-09-20: qty 3

## 2014-09-20 MED ORDER — PREDNISONE 50 MG PO TABS: 60.0000 mg | ORAL_TABLET | Freq: Once | ORAL | Status: DC

## 2014-09-20 NOTE — ED Notes (Addendum)
Made aware that patient walked out. Checked room, pt not in room. Notified Dr. Wilkie AyeHorton. Pt left before being able to sign AMA form. Security saw patient leave, ambulate out front emergency room door without difficulties.

## 2014-09-20 NOTE — ED Notes (Signed)
Pt given 2 blankets.States "I want a room upstairs".

## 2014-09-20 NOTE — ED Notes (Signed)
Pt was trespassing at [employer] and was arrested went to jail stating that he had COPD, hypoglycemia, and he needed a breathing treatment. Alert and oriented. Manic.

## 2014-09-20 NOTE — ED Notes (Signed)
[employer] at bedside 

## 2014-09-20 NOTE — ED Notes (Signed)
Patient c/o shortness of breath, denies any chest pain. Patient requesting nebulizer treatment. Per patient has been up here x3-4 times in past 4 days-given inhaler. Patient reports last using inhaler at 7 this morning. Refusing EKG.

## 2014-09-20 NOTE — ED Notes (Signed)
Pt calling out, states "I'm cold", offered more warm blankets. Pt refused blankets, states "I want my nebulizer treatment".

## 2014-09-20 NOTE — ED Notes (Signed)
Pt refused blood work, chest xray, ekg and prednisone. Pt educated on why we these were ordered and still refused. Dr. Wilkie AyeHorton notified.

## 2014-09-20 NOTE — ED Provider Notes (Signed)
CSN: [id number]     Arrival date & time 09/20/14  1414 History   First MD Initiated Contact with Patient 09/20/14 1459     Chief Complaint  Patient presents with  . Shortness of Breath     (Consider location/radiation/quality/duration/timing/severity/associated sxs/prior Treatment) HPI  This is a 68 year old with aortic valve stenosis, COPD, bipolar disorder who presents with shortness of breath. Patient has been seen and evaluated multiple times including twice over the weekend. He reports that he is short of breath and "just needs a DuoNeb." He has refused EKG or cardiac monitoring. Patient reports knifelike chest pain because "my COPD." He will not elaborate any further. He has not used an inhaler because he states that he got "the wrong prescription the other night." He reports chills without documented fevers. In general, patient is very uncooperative with history taking and continues to ask for "just give him a duo neb."  This is the patient's sixth visit since the beginning of November.  Past Medical History  Diagnosis Date  . Severe aortic valve stenosis     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  . COPD (chronic obstructive pulmonary disease)   . Thoracic ascending aortic aneurysm     [facility]  . COPD (chronic obstructive pulmonary disease)   . BPH (benign prostatic hyperplasia)   . Pacemaker     [facility]  . SSS (sick sinus syndrome)     s/p PPM   . CAD (coronary artery disease)     ?MI in 2007  . Pneumothorax     traumatic 2007  . Anxiety and depression   . Psychosis   . Bipolar disorder    Past Surgical History  Procedure Laterality Date  . Cardiac surgery      ascending thoracic aneurysm repair 06/2014  . Pacemaker placement  2007    battery replaced 07/04/2014  . Hiatal hernia repair    . Knee surgery      L5-6  . Lumbar fusion    . Aortic valve replacement      porcine valve   Family History  Problem  Relation Age of Onset  . CAD Father   . Diabetes Father   . High blood pressure Brother   . Cancer Brother    History  Substance Use Topics  . Smoking status: Former Games developermoker  . Smokeless tobacco: Not on file  . Alcohol Use: Yes    Review of Systems  Constitutional: Negative.  Negative for fever.  Respiratory: Positive for chest tightness, shortness of breath and wheezing.   Cardiovascular: Positive for chest pain. Negative for leg swelling.  Gastrointestinal: Negative.  Negative for abdominal pain.  Genitourinary: Negative.  Negative for dysuria.  Musculoskeletal: Negative for back pain.  Neurological: Negative for headaches.  Psychiatric/Behavioral: Positive for agitation.  All other systems reviewed and are negative.     Allergies  Tiotropium bromide monohydrate  Home Medications   Prior to Admission medications   Medication Sig Start Date End Date Taking? Authorizing Provider  ipratropium-albuterol (DUONEB) 0.5-2.5 (3) MG/3ML SOLN Take 3 mLs by nebulization every 4 (four) hours as needed. Patient taking differently: Take 3 mLs by nebulization every 4 (four) hours as needed. Shortness of breath/wheezing 09/12/14  Yes Velna HatchetSheila May Agustin, NP  albuterol (PROVENTIL HFA;VENTOLIN HFA) 108 (90 BASE) MCG/ACT inhaler Inhale 2 puffs into the lungs every 4 (four) hours as needed. 09/18/14   Ward GivensIva L Knapp, MD  aspirin 81 MG tablet Take  1 tablet (81 mg total) by mouth daily. Patient not taking: Reported on 09/18/2014 09/12/14   [facility], NP  furosemide (LASIX) 40 MG tablet Take 1 tablet (40 mg total) by mouth daily. Patient not taking: Reported on 09/18/2014 09/12/14   [facility], NP  gabapentin (NEURONTIN) 100 MG capsule Take 2 capsules (200 mg total) by mouth 3 (three) times daily. Patient not taking: Reported on 09/18/2014 09/12/14   [facility], NP  mometasone-formoterol [facility](DULERA) 200-5 MCG/ACT AERO Inhale 2 puffs into the lungs 2 (two) times daily. Patient  not taking: Reported on 09/18/2014 09/12/14   [facility] Agustin, NP  traZODone (DESYREL) 50 MG tablet Take 1 tablet (50 mg total) by mouth at bedtime as needed for sleep. Patient not taking: Reported on 09/18/2014 09/12/14   Velna HatchetSheila May Agustin, NP   BP 108/74 mmHg  Pulse 100  Temp(Src) 98.9 F (37.2 C) (Oral)  Resp 20  Ht 5' 10.5" (1.791 m)  Wt 165 lb (74.844 kg)  BMI 23.33 kg/m2  SpO2 96% Physical Exam  Constitutional: He is oriented to person, place, and time. No distress.  HENT:  Head: Normocephalic and atraumatic.  Eyes: Pupils are equal, round, and reactive to light.  Cardiovascular: Normal rate, regular rhythm and normal heart sounds.   No murmur heard. Pulmonary/Chest: Effort normal. No respiratory distress. He has wheezes. He exhibits no tenderness.  Abdominal: Soft. There is no tenderness.  Musculoskeletal: He exhibits no edema.  Neurological: He is alert and oriented to person, place, and time.  Skin: Skin is warm and dry.  Psychiatric: He has a normal mood and affect.  Nursing note and vitals reviewed.   ED Course  Procedures (including critical care time) Labs Review Labs Reviewed  CBC WITH DIFFERENTIAL  BASIC METABOLIC PANEL  TROPONIN I    Imaging Review No results found.   EKG Interpretation None      MDM   Final diagnoses:  Shortness of breath    Patient presents with shortness of breath. Generally uncooperative and agitated on exam. Patient refusing all interventions including monitoring and EKG. Labs and DuoNeb ordered. Patient eloped prior to treatment and full evaluation.    Shon Batonourtney F Horton, MD 09/20/14 [phone]

## 2014-09-20 NOTE — ED Notes (Signed)
Bed: [id number] Expected date: 09/20/14 Expected time: 9:57 PM Means of arrival: Ambulance Comments: Hyperglycemic, from jail

## 2014-09-21 ENCOUNTER — Encounter (HOSPITAL_COMMUNITY): Payer: Self-pay

## 2014-09-21 ENCOUNTER — Inpatient Hospital Stay (HOSPITAL_COMMUNITY)
Admission: EM | Admit: 2014-09-21 | Discharge: 2014-10-04 | DRG: 190 | Disposition: A | Discharge status: Home / Self Care | Payer: Medicare Other | Attending: Internal Medicine | Admitting: Internal Medicine

## 2014-09-21 DIAGNOSIS — J189 Pneumonia, unspecified organism: Secondary | ICD-10-CM

## 2014-09-21 DIAGNOSIS — F3113 Bipolar disorder, current episode manic without psychotic features, severe: Secondary | ICD-10-CM | POA: Diagnosis present

## 2014-09-21 DIAGNOSIS — I5033 Acute on chronic diastolic (congestive) heart failure: Secondary | ICD-10-CM | POA: Diagnosis present

## 2014-09-21 DIAGNOSIS — I4581 Long QT syndrome: Secondary | ICD-10-CM | POA: Diagnosis present

## 2014-09-21 DIAGNOSIS — Z950 Presence of cardiac pacemaker: Secondary | ICD-10-CM | POA: Diagnosis present

## 2014-09-21 DIAGNOSIS — R0602 Shortness of breath: Secondary | ICD-10-CM | POA: Diagnosis present

## 2014-09-21 DIAGNOSIS — R946 Abnormal results of thyroid function studies: Secondary | ICD-10-CM | POA: Diagnosis present

## 2014-09-21 DIAGNOSIS — Z953 Presence of xenogenic heart valve: Secondary | ICD-10-CM

## 2014-09-21 DIAGNOSIS — Z7982 Long term (current) use of aspirin: Secondary | ICD-10-CM

## 2014-09-21 DIAGNOSIS — Z952 Presence of prosthetic heart valve: Secondary | ICD-10-CM

## 2014-09-21 DIAGNOSIS — F3010 Manic episode without psychotic symptoms, unspecified: Secondary | ICD-10-CM

## 2014-09-21 DIAGNOSIS — Z87891 Personal history of nicotine dependence: Secondary | ICD-10-CM

## 2014-09-21 DIAGNOSIS — F1290 Cannabis use, unspecified, uncomplicated: Secondary | ICD-10-CM | POA: Diagnosis present

## 2014-09-21 DIAGNOSIS — I5031 Acute diastolic (congestive) heart failure: Secondary | ICD-10-CM

## 2014-09-21 DIAGNOSIS — R0902 Hypoxemia: Secondary | ICD-10-CM | POA: Diagnosis present

## 2014-09-21 DIAGNOSIS — J441 Chronic obstructive pulmonary disease with (acute) exacerbation: Principal | ICD-10-CM

## 2014-09-21 DIAGNOSIS — Z9119 Patient's noncompliance with other medical treatment and regimen: Secondary | ICD-10-CM | POA: Diagnosis present

## 2014-09-21 DIAGNOSIS — Y95 Nosocomial condition: Secondary | ICD-10-CM | POA: Diagnosis present

## 2014-09-21 DIAGNOSIS — F1010 Alcohol abuse, uncomplicated: Secondary | ICD-10-CM | POA: Diagnosis present

## 2014-09-21 DIAGNOSIS — I2510 Atherosclerotic heart disease of native coronary artery without angina pectoris: Secondary | ICD-10-CM | POA: Diagnosis present

## 2014-09-21 DIAGNOSIS — F302 Manic episode, severe with psychotic symptoms: Secondary | ICD-10-CM | POA: Diagnosis present

## 2014-09-21 MED ORDER — IPRATROPIUM-ALBUTEROL 0.5-2.5 (3) MG/3ML IN SOLN: 3.0000 mL | RESPIRATORY_TRACT | Status: DC | PRN

## 2014-09-21 MED ORDER — PREDNISONE 20 MG PO TABS: 60.0000 mg | ORAL_TABLET | Freq: Every day | ORAL | Status: DC

## 2014-09-21 NOTE — Discharge Instructions (Signed)
Follow-up with your doctor in [place].  Take medications as prescribed.   Chronic Obstructive Pulmonary Disease Exacerbation Chronic obstructive pulmonary disease (COPD) is a common lung condition in which airflow from the lungs is limited. COPD is a general term that can be used to describe many different lung problems that limit airflow, including chronic bronchitis and emphysema. COPD exacerbations are episodes when breathing symptoms become much worse and require extra treatment. Without treatment, COPD exacerbations can be life threatening, and frequent COPD exacerbations can cause further damage to your lungs. CAUSES   Respiratory infections.   Exposure to smoke.   Exposure to air pollution, chemical fumes, or dust. Sometimes there is no apparent cause or trigger. RISK FACTORS  Smoking cigarettes.  Older age.  Frequent prior COPD exacerbations. SIGNS AND SYMPTOMS   Increased coughing.   Increased thick spit (sputum) production.   Increased wheezing.   Increased shortness of breath.   Rapid breathing.   Chest tightness. DIAGNOSIS  Your medical history, a physical exam, and tests will help your health care provider make a diagnosis. Tests may include:  A chest X-ray.  Basic lab tests.  Sputum testing.  An arterial blood gas test. TREATMENT  Depending on the severity of your COPD exacerbation, you may need to be admitted to a hospital for treatment. Some of the treatments commonly used to treat COPD exacerbations are:   Antibiotic medicines.   Bronchodilators. These are drugs that expand the air passages. They may be given with an inhaler or nebulizer. Spacer devices may be needed to help improve drug delivery.  Corticosteroid medicines.  Supplemental oxygen therapy.  HOME CARE INSTRUCTIONS   Do not smoke. Quitting smoking is very important to prevent COPD from getting worse and exacerbations from happening as often.  Avoid exposure to all  substances that irritate the airway, especially to tobacco smoke.   If you were prescribed an antibiotic medicine, finish it all even if you start to feel better.  Take all medicines as directed by your health care provider.It is important to use correct technique with inhaled medicines.  Drink enough fluids to keep your urine clear or pale yellow (unless you have a medical condition that requires fluid restriction).  Use a cool mist vaporizer. This makes it easier to clear your chest when you cough.   If you have a home nebulizer and oxygen, continue to use them as directed.   Maintain all necessary vaccinations to prevent infections.   Exercise regularly.   Eat a healthy diet.   Keep all follow-up appointments as directed by your health care provider. SEEK IMMEDIATE MEDICAL CARE IF:  You have worsening shortness of breath.   You have trouble talking.   You have severe chest pain.  You have blood in your sputum.  You have a fever.  You have weakness, vomit repeatedly, or faint.   You feel confused.   You continue to get worse. MAKE SURE YOU:   Understand these instructions.  Will watch your condition.  Will get help right away if you are not doing well or get worse. Document Released: 08/11/2007 Document Revised: 02/28/2014 Document Reviewed: 06/18/2013 [facility] Patient Information 2015 [employer] [place]. This information is not intended to replace advice given to you by your health care provider. Make sure you discuss any questions you have with your health care provider.

## 2014-09-21 NOTE — ED Notes (Signed)
Pt brought in by [employer] with IVC papers due to bipolar and not taking his meds, has been staying at the local motel, has been showing aggressiveness towards others, staying awake all night, causing disruptions.  Pt is not willing to speak to this nurse about his issues.

## 2014-09-21 NOTE — ED Provider Notes (Signed)
CSN: [id number]     Arrival date & time 09/20/14  2206 History   First MD Initiated Contact with Patient 09/20/14 2306     Chief Complaint  Patient presents with  . COPD     (Consider location/radiation/quality/duration/timing/severity/associated sxs/prior Treatment) HPI 68 year old male presents [facility] emergency department from jail with complaint of low blood sugar and wheezing.  Patient has had multiple visits in the last 2 weeks for COPD related complaints.  Patient is currently in jail due to trespassing.  He reports he has a history of diabetes and reports his blood sugar normally runs 1 6180.  It is currently 116, he reports that his weight to low for him.  Patient also requesting DuoNeb.  Patient lives in [place], [place], it is unclear why he is in [place].  He refuses to answer. Past Medical History  Diagnosis Date  . Severe aortic valve stenosis     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 at [facility]  . COPD (chronic obstructive pulmonary disease)   . Thoracic ascending aortic aneurysm     repaired 06/2014 at [facility]  . COPD (chronic obstructive pulmonary disease)   . BPH (benign prostatic hyperplasia)   . Pacemaker     battery replaced 06/2014 at [facility]  . SSS (sick sinus syndrome)     s/p PPM   . CAD (coronary artery disease)     ?MI in 2007  . Pneumothorax     traumatic 2007  . Anxiety and depression   . Psychosis   . Bipolar disorder    Past Surgical History  Procedure Laterality Date  . Cardiac surgery      ascending thoracic aneurysm repair 06/2014  . Pacemaker placement  2007    battery replaced 07/04/2014  . Hiatal hernia repair    . Knee surgery      L5-6  . Lumbar fusion    . Aortic valve replacement      porcine valve   Family History  Problem Relation Age of Onset  . CAD Father   . Diabetes Father   . High blood pressure Brother   . Cancer Brother    History  Substance Use Topics  . Smoking status: Former Games developer  . Smokeless tobacco: Not on file  .  Alcohol Use: Yes    Review of Systems  Review of systems is limited secondary to patient refusing to answer most questions  Allergies  Tiotropium bromide monohydrate  Home Medications   Prior to Admission medications   Medication Sig Start Date End Date Taking? Authorizing Provider  albuterol (PROVENTIL HFA;VENTOLIN HFA) 108 (90 BASE) MCG/ACT inhaler Inhale 2 puffs into the lungs every 4 (four) hours as needed. 09/18/14  Yes Ward Givens, MD  ipratropium-albuterol (DUONEB) 0.5-2.5 (3) MG/3ML SOLN Take 3 mLs by nebulization every 4 (four) hours as needed. Patient taking differently: Take 3 mLs by nebulization every 4 (four) hours as needed. Shortness of breath/wheezing 09/12/14  Yes Velna Hatchet May Agustin, NP  aspirin 81 MG tablet Take 1 tablet (81 mg total) by mouth daily. Patient not taking: Reported on 09/18/2014 09/12/14   [facility], NP  furosemide (LASIX) 40 MG tablet Take 1 tablet (40 mg total) by mouth daily. Patient not taking: Reported on 09/18/2014 09/12/14   [facility], NP  gabapentin (NEURONTIN) 100 MG capsule Take 2 capsules (200 mg total) by mouth 3 (three) times daily. Patient not taking: Reported on 09/18/2014 09/12/14   [facility], NP  mometasone-formoterol [facility]) 200-5 MCG/ACT  AERO Inhale 2 puffs into the lungs 2 (two) times daily. Patient not taking: Reported on 09/18/2014 09/12/14   [facility], NP  traZODone (DESYREL) 50 MG tablet Take 1 tablet (50 mg total) by mouth at bedtime as needed for sleep. Patient not taking: Reported on 09/18/2014 09/12/14   Velna HatchetSheila May Agustin, NP   BP 105/70 mmHg  Pulse 97  Temp(Src)   Resp 20  SpO2 95% Physical Exam  Constitutional: He is oriented to person, place, and time. He appears well-developed and well-nourished.  Patient is irritable  HENT:  Head: Normocephalic and atraumatic.  Nose: Nose normal.  Mouth/Throat: Oropharynx is clear and moist.  Eyes: Conjunctivae and EOM are normal. Pupils are  equal, round, and reactive to light.  Neck: Normal range of motion. Neck supple. No JVD present. No tracheal deviation present. No thyromegaly present.  Cardiovascular: Normal rate, regular rhythm, normal heart sounds and intact distal pulses.  Exam reveals no gallop and no friction rub.   No murmur heard. Pulmonary/Chest: Effort normal. No stridor. No respiratory distress. He has wheezes. He has no rales. He exhibits no tenderness.  Abdominal: Soft. Bowel sounds are normal. He exhibits no distension and no mass. There is no tenderness. There is no rebound and no guarding.  Musculoskeletal: Normal range of motion. He exhibits no edema or tenderness.  Lymphadenopathy:    He has no cervical adenopathy.  Neurological: He is alert and oriented to person, place, and time. He displays normal reflexes. He exhibits normal muscle tone. Coordination normal.  Skin: Skin is warm and dry. No rash noted. No erythema. No pallor.  Psychiatric:  Poor insight and judgment  Nursing note and vitals reviewed.   ED Course  Procedures (including critical care time) Labs Review Labs Reviewed  CBG MONITORING, ED - Abnormal; Notable for the following:    Glucose-Capillary 116 (*)    All other components within normal limits    Imaging Review No results found.   EKG Interpretation None     Results for orders placed or performed during the hospital encounter of 09/20/14  CBG monitoring, ED  Result Value Ref Range   Glucose-Capillary 116 (H) 70 - 99 mg/dL   Dg Chest 2 View  16/10/960411/19/2015   CLINICAL DATA:  Chest pain  EXAM: CHEST  2 VIEW  COMPARISON:  09/01/2014  FINDINGS: Cardiac shadow is stable. Valve replacement and pacemaker device are again noted. The lungs are hyperinflated consistent with COPD. No focal infiltrate  IMPRESSION: COPD without acute abnormality.   Electronically Signed   By: Alcide CleverMark  Lukens M.D.   On: 09/15/2014 00:29   Dg Chest Portable 1 View  09/01/2014   CLINICAL DATA:  Pt complained  of CP x 3 hrs and SOB upon arrival to ED tonight. Pt is uncooperative and aggressive. Pt to be involuntarily committed. Per Pts chart - aortic valve replacement, COPD, pacemaker, hiatal hernia repair, former smoker  EXAM: PORTABLE CHEST - 1 VIEW  COMPARISON:  None.  FINDINGS: Pacer. Prior median sternotomy. Remote right rib trauma. Chin overlies the apices minimally. Normal heart size. No pleural effusion or pneumothorax. Surgical clips project over the right lung apex laterally. Hyperinflation.  IMPRESSION: Hyperinflation without acute superimposed process.   Electronically Signed   By: Jeronimo GreavesKyle  Talbot M.D.   On: 09/01/2014 17:56    MDM   Final diagnoses:  COPD exacerbation    68 year old male with COPD.  After DuoNeb.  He is feeling better.  As he has had multiple ED  visits recently, will put him on a steroid burst as well as refill his Elwin SleightDulera which he says he does not have.  Patient also requesting albuterol nebulizer solution   Olivia Mackielga M Ludwika Rodd, MD 09/21/14 0025

## 2014-09-22 LAB — BASIC METABOLIC PANEL
Anion gap: 12 (ref 5–15)
BUN: 18 mg/dL (ref 6–23)
CO2: 30 mEq/L (ref 19–32)
Calcium: 9.5 mg/dL (ref 8.4–10.5)
Chloride: 97 mEq/L (ref 96–112)
Creatinine, Ser: 0.87 mg/dL (ref 0.50–1.35)
GFR calc Af Amer: >90 mL/min (ref >90)
GFR calc non Af Amer: 87 mL/min — ABNORMAL LOW (ref >90)
Glucose, Bld: 109 mg/dL — ABNORMAL HIGH (ref 70–99)
Potassium: 3.7 mEq/L (ref 3.7–5.3)
Sodium: 139 mEq/L (ref 137–147)

## 2014-09-22 LAB — RAPID URINE DRUG SCREEN, HOSP PERFORMED
Amphetamines: NOT DETECTED
Barbiturates: NOT DETECTED
Benzodiazepines: NOT DETECTED
Cocaine: NOT DETECTED
Opiates: NOT DETECTED
Tetrahydrocannabinol: NOT DETECTED

## 2014-09-22 LAB — CBC WITH DIFFERENTIAL/PLATELET
Basophils Absolute: 0 10*3/uL (ref 0.0–0.1)
Basophils Relative: 0 % (ref 0–1)
Eosinophils Absolute: 0 10*3/uL (ref 0.0–0.7)
Eosinophils Relative: 0 % (ref 0–5)
HCT: 38.6 % — ABNORMAL LOW (ref 39.0–52.0)
Hemoglobin: 12.4 g/dL — ABNORMAL LOW (ref 13.0–17.0)
Lymphocytes Relative: 12 % (ref 12–46)
Lymphs Abs: 2.2 10*3/uL (ref 0.7–4.0)
MCH: 29.4 pg (ref 26.0–34.0)
MCHC: 32.1 g/dL (ref 30.0–36.0)
MCV: 91.5 fL (ref 78.0–100.0)
Monocytes Absolute: 1.5 10*3/uL — ABNORMAL HIGH (ref 0.1–1.0)
Monocytes Relative: 9 % (ref 3–12)
Neutro Abs: 13.7 10*3/uL — ABNORMAL HIGH (ref 1.7–7.7)
Neutrophils Relative %: 79 % — ABNORMAL HIGH (ref 43–77)
Platelets: 373 10*3/uL (ref 150–400)
RBC: 4.22 MIL/uL (ref 4.22–5.81)
RDW: 15.6 % — ABNORMAL HIGH (ref 11.5–15.5)
WBC: 17.5 10*3/uL — ABNORMAL HIGH (ref 4.0–10.5)

## 2014-09-22 LAB — CBG MONITORING, ED: Glucose-Capillary: 123 mg/dL — ABNORMAL HIGH (ref 70–99)

## 2014-09-22 LAB — SALICYLATE LEVEL: Salicylate Lvl: <2 mg/dL — ABNORMAL LOW (ref 2.8–20.0)

## 2014-09-22 LAB — ACETAMINOPHEN LEVEL: Acetaminophen (Tylenol), Serum: <15 ug/mL (ref 10–30)

## 2014-09-22 LAB — ETHANOL: Alcohol, Ethyl (B): <11 mg/dL (ref 0–11)

## 2014-09-22 MED ORDER — FUROSEMIDE 40 MG PO TABS
40.0000 mg | ORAL_TABLET | Freq: Every day | ORAL | Status: DC
  Administered 2014-09-22 – 2014-09-23 (×2): 40 mg via ORAL
  Filled 2014-09-22 (×2): qty 1

## 2014-09-22 MED ORDER — ALBUTEROL SULFATE HFA 108 (90 BASE) MCG/ACT IN AERS
1.0000 | INHALATION_SPRAY | RESPIRATORY_TRACT | Status: DC | PRN
  Administered 2014-09-22: 2 via RESPIRATORY_TRACT
  Filled 2014-09-22: qty 6.7

## 2014-09-22 MED ORDER — DEXAMETHASONE SODIUM PHOSPHATE 10 MG/ML IJ SOLN
10.0000 mg | Freq: Once | INTRAMUSCULAR | Status: AC
  Administered 2014-09-22: 10 mg via INTRAMUSCULAR
  Filled 2014-09-22: qty 1

## 2014-09-22 MED ORDER — ZIPRASIDONE MESYLATE 20 MG IM SOLR
20.0000 mg | Freq: Once | INTRAMUSCULAR | Status: AC
  Administered 2014-09-22: 20 mg via INTRAMUSCULAR
  Filled 2014-09-22: qty 20

## 2014-09-22 MED ORDER — MOMETASONE FURO-FORMOTEROL FUM 200-5 MCG/ACT IN AERO
2.0000 | INHALATION_SPRAY | Freq: Two times a day (BID) | RESPIRATORY_TRACT | Status: DC
  Administered 2014-09-22 – 2014-09-23 (×4): 2 via RESPIRATORY_TRACT
  Filled 2014-09-22 (×3): qty 8.8

## 2014-09-22 MED ORDER — ALBUTEROL SULFATE (2.5 MG/3ML) 0.083% IN NEBU
2.5000 mg | INHALATION_SOLUTION | RESPIRATORY_TRACT | Status: DC | PRN
Start: 2014-09-22 — End: 2014-09-24
  Administered 2014-09-22 – 2014-09-23 (×4): 2.5 mg via RESPIRATORY_TRACT
  Filled 2014-09-22 (×5): qty 3

## 2014-09-22 MED ORDER — IPRATROPIUM-ALBUTEROL 0.5-2.5 (3) MG/3ML IN SOLN
3.0000 mL | RESPIRATORY_TRACT | Status: DC
  Administered 2014-09-22 – 2014-09-24 (×9): 3 mL via RESPIRATORY_TRACT
  Filled 2014-09-22 (×10): qty 3

## 2014-09-22 MED ORDER — IPRATROPIUM-ALBUTEROL 0.5-2.5 (3) MG/3ML IN SOLN
3.0000 mL | RESPIRATORY_TRACT | Status: DC | PRN
  Administered 2014-09-22 (×2): 3 mL via RESPIRATORY_TRACT
  Filled 2014-09-22 (×2): qty 3

## 2014-09-22 MED ORDER — ASPIRIN 81 MG PO CHEW
81.0000 mg | CHEWABLE_TABLET | Freq: Every day | ORAL | Status: DC
  Administered 2014-09-22 – 2014-10-04 (×11): 81 mg via ORAL
  Filled 2014-09-22 (×13): qty 1

## 2014-09-22 MED ORDER — ALBUTEROL SULFATE HFA 108 (90 BASE) MCG/ACT IN AERS
1.0000 | INHALATION_SPRAY | RESPIRATORY_TRACT | Status: DC | PRN
  Administered 2014-09-23 – 2014-09-24 (×4): 2 via RESPIRATORY_TRACT
  Filled 2014-09-22 (×2): qty 6.7

## 2014-09-22 MED ORDER — ONDANSETRON 4 MG PO TBDP
4.0000 mg | ORAL_TABLET | Freq: Three times a day (TID) | ORAL | Status: DC | PRN
  Administered 2014-09-22: 4 mg via ORAL
  Filled 2014-09-22 (×2): qty 1

## 2014-09-22 MED ORDER — METHYLPREDNISOLONE SODIUM SUCC 125 MG IJ SOLR: 80.0000 mg | Freq: Once | INTRAMUSCULAR | Status: DC

## 2014-09-22 MED ORDER — IPRATROPIUM-ALBUTEROL 0.5-2.5 (3) MG/3ML IN SOLN
3.0000 mL | Freq: Once | RESPIRATORY_TRACT | Status: AC
  Administered 2014-09-22: 3 mL via RESPIRATORY_TRACT
  Filled 2014-09-22: qty 3

## 2014-09-22 MED ORDER — STERILE WATER FOR INJECTION IJ SOLN
INTRAMUSCULAR | Status: AC
  Administered 2014-09-22: 06:00:00
  Filled 2014-09-22: qty 10

## 2014-09-22 NOTE — ED Notes (Signed)
Spoke with Sharlet SalinaSharon Neville from DSS regarding pts care.  Ms. Elson [place] spoke with [employer] officer regarding pts [facility].

## 2014-09-22 NOTE — ED Notes (Signed)
Pt upset that his room does not have a call bell and that he is not allowed to have any coffee per psych pt guidelines. Pt informed of the psych pt guidelines. Pt is yelling from room cussing that he is going to "sue this damn hospital. I am in charge. Not that dumb ass doctor. They took my call bell so they can't hear me. That bitch nurse, that cunt."

## 2014-09-22 NOTE — ED Notes (Signed)
[facility] with [employer] [facility]

## 2014-09-22 NOTE — ED Notes (Signed)
Pt continues to yell at staff, coming out into hallway cussing at staff. Pt told to go back to his room and get in bed as respiratory was on their way to provide a breathing treatment. Pt started cussing at the nurses, saying "you bitch" John, NT told patient not to speak that way to staff. Pt got in John's face and refused to back down or sit on the bed. Was able to redirect pt into bed with police assistance. Pt continues to be yell at staff. EDP made aware of request for sedation medication.

## 2014-09-22 NOTE — ED Notes (Signed)
Police officer redirected pt into room from doorway. Office at bedside with pt.

## 2014-09-22 NOTE — ED Notes (Signed)
Attempted to do telepsych but pt unwilling to answer questions. When [facility] assessment [name] Dach(Ford) came on camera, pt covered his eyes and refused to respond to questions.

## 2014-09-22 NOTE — BH Assessment (Signed)
Received call for assessment. Spoke to Dr. Blane OharaJoshua Zavitz, MD who said Pt has a history of bipolar disorder and is off medication. He has had multiple ED visits and is currently under IVC. He appears religiously preoccupied. Tele-assessment will be initiated.  Harlin RainFord Ellis Ria CommentWarrick Jr, LPC, [facility] Triage Specialist [phone]

## 2014-09-22 NOTE — ED Notes (Signed)
[facility] with hx of Bipolar DO.  Pt is IVC, presents with complaint of erratic behavior.  Pt diagnosed with COPD.  Denies SI, HI or AV hallucinations.  Pt cooperative and very demanding.  Will continue to monitor for safety.

## 2014-09-22 NOTE — ED Notes (Signed)
Came into pt's room informing pt that he would be getting a shot. Pt refusing, stating "I haven't had my breakfast, I haven't had my coffee. That is going to put me out." Told pt that he could either allow me to give him the shot or he would be restrained by staff and the police officer and given the shot. Pt took his water cup and removed the lid stating "I am going to drink my water. I want my pills and my breakfast". Told pt that he was getting the shot first. Pt continued to refuse to cooperate. Water cup was removed from pt's hand as he refused to release cup. Pt was restrained by staff and the police officer and the shot given into his thigh. Pt is shackled by the ankle to the bed by the police officer. Respiratory prepared to give breathing treatment.

## 2014-09-22 NOTE — ED Notes (Signed)
Lab at bedside

## 2014-09-22 NOTE — ED Notes (Signed)
Pt stating he needs to go to the bathroom. Given a urinal but informed the shackles would not be removed at this time. Pt refused to use the urinal stating, "next time I am going to pee all over the floor".

## 2014-09-22 NOTE — ED Provider Notes (Signed)
CSN: [id number]     Arrival date & time 09/21/14  2317 History   First MD Initiated Contact with Patient 09/22/14 0015     Chief Complaint  Patient presents with  . V70.1     (Consider location/radiation/quality/duration/timing/severity/associated sxs/prior Treatment) HPI Comments: 68 year old male with history of severe air valve stenosis, CAD, bipolar 1 with psychosis and mania presents with IVC paperwork filled out and police for psychiatric assessment. Patient denies majority of report except admits he doesn't take his medicines. Patient unsure which medicines he supposed to take for bipolar. The patient says the only reason he is here is because he was not giving his significant other sex.  Pt denies SI and HI.  Difficulty obtaining details due to manic state and patient denying most questions. Patient is rambling and unable to focus on one topic. Patient says only God has the answers.  The history is provided by the patient, the police and medical records.    Past Medical History  Diagnosis Date  . Severe aortic valve stenosis     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  . COPD (chronic obstructive pulmonary disease)   . Thoracic ascending aortic aneurysm     [facility]  . COPD (chronic obstructive pulmonary disease)   . BPH (benign prostatic hyperplasia)   . Pacemaker     [facility]  . SSS (sick sinus syndrome)     s/p PPM   . CAD (coronary artery disease)     ?MI in 2007  . Pneumothorax     traumatic 2007  . Anxiety and depression   . Psychosis   . Bipolar disorder    Past Surgical History  Procedure Laterality Date  . Cardiac surgery      ascending thoracic aneurysm repair 06/2014  . Pacemaker placement  2007    battery replaced 07/04/2014  . Hiatal hernia repair    . Knee surgery      L5-6  . Lumbar fusion    . Aortic valve replacement      porcine valve   Family History  Problem Relation Age of Onset  . CAD Father    . Diabetes Father   . High blood pressure Brother   . Cancer Brother    History  Substance Use Topics  . Smoking status: Former Games developermoker  . Smokeless tobacco: Not on file  . Alcohol Use: Yes    Review of Systems  Constitutional: Negative for fever and chills.  HENT: Negative for congestion.   Eyes: Negative for visual disturbance.  Respiratory: Negative for shortness of breath.   Cardiovascular: Negative for chest pain.  Gastrointestinal: Negative for vomiting and abdominal pain.  Genitourinary: Negative for dysuria and flank pain.  Musculoskeletal: Negative for back pain, neck pain and neck stiffness.  Skin: Negative for rash.  Neurological: Negative for light-headedness and headaches.      Allergies  Tiotropium bromide monohydrate  Home Medications   Prior to Admission medications   Medication Sig Start Date End Date Taking? Authorizing Provider  albuterol (PROVENTIL HFA;VENTOLIN HFA) 108 (90 BASE) MCG/ACT inhaler Inhale 2 puffs into the lungs every 4 (four) hours as needed. 09/18/14  Yes Ward GivensIva L Knapp, MD  aspirin 81 MG tablet Take 1 tablet (81 mg total) by mouth daily. 09/12/14  Yes Velna HatchetSheila May Agustin, NP  furosemide (LASIX) 40 MG tablet Take 1 tablet (40 mg total) by mouth daily. 09/12/14  Yes Velna HatchetSheila May Agustin, NP  ipratropium-albuterol (DUONEB)  0.5-2.5 (3) MG/3ML SOLN Take 3 mLs by nebulization every 4 (four) hours as needed. Shortness of breath/wheezing 09/21/14  Yes Olivia Mackie, MD  mometasone-formoterol [facility]) 200-5 MCG/ACT AERO Inhale 2 puffs into the lungs 2 (two) times daily. 09/12/14  Yes Velna Hatchet May Agustin, NP  predniSONE (DELTASONE) 20 MG tablet Take 3 tablets (60 mg total) by mouth daily. 09/21/14  Yes Olivia Mackie, MD  gabapentin (NEURONTIN) 100 MG capsule Take 2 capsules (200 mg total) by mouth 3 (three) times daily. Patient not taking: Reported on 09/18/2014 09/12/14   [facility], NP  traZODone (DESYREL) 50 MG tablet Take 1 tablet (50 mg total)  by mouth at bedtime as needed for sleep. Patient not taking: Reported on 09/18/2014 09/12/14   Velna Hatchet May Agustin, NP   BP 121/56 mmHg  Pulse 98  Temp(Src) 97.5 F (36.4 C) (Oral)  Resp 18  Ht 5' 10.5" (1.791 m)  Wt 165 lb (74.844 kg)  BMI 23.33 kg/m2  SpO2 97% Physical Exam  Constitutional: He appears well-developed and well-nourished.  HENT:  Head: Normocephalic and atraumatic.  Eyes: Conjunctivae are normal. Right eye exhibits no discharge. Left eye exhibits no discharge.  Neck: Normal range of motion. Neck supple. No tracheal deviation present.  Cardiovascular: Normal rate and regular rhythm.   Pulmonary/Chest: Effort normal. He has wheezes (mildexpiratory bilateral).  Abdominal: Soft. He exhibits no distension. There is no tenderness. There is no guarding.  Musculoskeletal: He exhibits no edema.  Neurological: He is alert.  Skin: Skin is warm. No rash noted.  Psychiatric: His speech is rapid and/or pressured. He expresses no homicidal and no suicidal ideation. He expresses no suicidal plans and no homicidal plans.  Patient speaking loudly and controlling majority of conversation. Clinically manic  Nursing note and vitals reviewed.   ED Course  Procedures (including critical care time) Labs Review Labs Reviewed  CBC WITH DIFFERENTIAL - Abnormal; Notable for the following:    WBC 17.5 (*)    Hemoglobin 12.4 (*)    HCT 38.6 (*)    RDW 15.6 (*)    Neutrophils Relative % 79 (*)    Neutro Abs 13.7 (*)    Monocytes Absolute 1.5 (*)    All other components within normal limits  BASIC METABOLIC PANEL - Abnormal; Notable for the following:    Glucose, Bld 109 (*)    GFR calc non Af Amer 87 (*)    All other components within normal limits  SALICYLATE LEVEL - Abnormal; Notable for the following:    Salicylate Lvl <2.0 (*)    All other components within normal limits  ETHANOL  URINE RAPID DRUG SCREEN (HOSP PERFORMED)  ACETAMINOPHEN LEVEL    Imaging Review No results  found.   EKG Interpretation None      MDM   Final diagnoses:  None   Patient presents with history of bipolar and mania with manic episode. Unsure medication patient is supposed to be taking, he denies taking any medications. IVC paperwork filled out. Then for blood work and psychiatric assessment in the morning.  IVC paperwork reviewed with concern for worsening manic symptoms. "He is going days without sleep and refusing medical care. He has been emergency departments numerous times over the last week. Responded is delusional, reporting his Ted Nugent in ability and their. Respondent reports he is working for [employer] and buying illegal drugs."  Breathing treatment ordered in ER. Patient's behavior and manic symptoms and signs continued to worsen causing disruption to  staff. Patient required antipsychotic medication to improve his signs and symptoms as well as for the safety of staff and to continue his evaluation and treatment.  Patient will be signed out to follow-up psychiatric recommendations in the morning.      Enid SkeensJoshua M Garrett Mitchum, MD 09/26/14 [phone]

## 2014-09-22 NOTE — BH Assessment (Signed)
Tele Assessment Note   OSBORN PULLIN is an 68 y.o. male, Caucasian who presents to [facility] ED by Patent examiner. Pt was petitioned for IVC by Sharlet Salina with DSS [phone]. Per affidavit and petition for involuntary commitment: "Respondent has bipolar disorder and is extremely manic. He is going days without sleep and is refusing medical care. He has been to emergency departments numerous times over the last week but is leaving against medical advice. Respondent had heart surgery recently and has COPD. He is not using breathing equipment. 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 Respondent reports he is working for [employer] and buying illegal drugs. Recentyl admitted to [facility] (IVC) due to aggressiveness and delusional thoughts. By not attending to severe medical issues he places himself in imminent danger."   Pt was dressed in scrubs sitting in a wheelchair. Pt refused to look at the camera or speak during assessment. He kept his hand over his eyes and refused to respond to questions. He gave a thumbs up sign when I asked if he would prefer I speak to him after he had some rest, indicating he can hear and understands the questions but is refusing to cooperate with assessment. Pt was inpatient at [facility] from 09/07/14-/09/12/14 due to manic and delusional symptoms similar to tonight's presentation. He was diagnosed with Bipolar Disorder type, recurrent, severe, manic with psychotic features.  Axis I: Bipolar Disorder type ,recurrent ,severe ,Manic with psychotic features Axis II: Deferred Axis III:  Past Medical History  Diagnosis Date  . 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 COPD (chronic obstructive pulmonary disease)   . Thoracic ascending aortic aneurysm     repaired 06/2014 at [facility]  . COPD (chronic obstructive pulmonary disease)   . BPH (benign prostatic hyperplasia)    . Pacemaker     battery replaced 06/2014 at [facility]  . SSS (sick sinus syndrome)     s/p PPM   . CAD (coronary artery disease)     ?MI in 2007  . Pneumothorax     traumatic 2007  . Anxiety and depression   . Psychosis   . Bipolar disorder    Axis IV: economic problems, housing problems and problems with access to health care services Axis V: GAF=20  Past Medical History:  Past Medical History  Diagnosis Date  . Severe aortic valve stenosis     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 at [facility]  . COPD (chronic obstructive pulmonary disease)   . Thoracic ascending aortic aneurysm     repaired 06/2014 at [facility]  . COPD (chronic obstructive pulmonary disease)   . BPH (benign prostatic hyperplasia)   . Pacemaker     battery replaced 06/2014 at [facility]  . SSS (sick sinus syndrome)     s/p PPM   . CAD (coronary artery disease)     ?MI in 2007  . Pneumothorax     traumatic 2007  . Anxiety and depression   . Psychosis   . Bipolar disorder     Past Surgical History  Procedure Laterality Date  . Cardiac surgery      ascending thoracic aneurysm repair 06/2014  . Pacemaker placement  2007    battery replaced 07/04/2014  . Hiatal hernia repair    . Knee surgery      L5-6  . Lumbar fusion    . Aortic valve replacement  porcine valve    Family History:  Family History  Problem Relation Age of Onset  . CAD Father   . Diabetes Father   . High blood pressure Brother   . Cancer Brother     Social History:  reports that he has quit smoking. He does not have any smokeless tobacco history on file. He reports that he drinks alcohol. He reports that he uses illicit drugs (Marijuana).  Additional Social History:  Alcohol / Drug Use Pain Medications: See PTA medication list Prescriptions: Pt says he uses a nebulizer for his COPD.  Non-compliant with his psychiatric meds. Over the Counter: N/A History of alcohol / drug use?: Yes Longest period of sobriety (when/how long):  Unknown Substance #1 Name of Substance 1: Marijuana 1 - Age of First Use: Teens 1 - Amount (size/oz): <1 joint at a time 1 - Frequency: 2-3x/W 1 - Duration: On-going 1 - Last Use / Amount: Unknown  CIWA: CIWA-Ar BP: 121/56 mmHg Pulse Rate: 98 COWS:    PATIENT STRENGTHS: (choose at least two) Average or above average intelligence Physical Health  Allergies:  Allergies  Allergen Reactions  . Tiotropium Bromide Monohydrate Nausea Only and Anaphylaxis    Tremors    Home Medications:  (Not in a hospital admission)  OB/GYN Status:  No LMP for male patient.  General Assessment Data Location of Assessment: [facility] ED Is this a Tele or Face-to-Face Assessment?: Tele Assessment Is this an Initial Assessment or a Re-assessment for this encounter?: Initial Assessment Living Arrangements: Alone (Living in a motel) Can pt return to current living arrangement?: No Admission Status: Involuntary Is patient capable of signing voluntary admission?: No Transfer from: Home Referral Source: Other Mudlogger(Law enforcement)     [facility] Living Arrangements: Alone (Living in a motel) Name of Psychiatrist: None Name of Therapist: None  Education Status Is patient currently in school?: No Current Grade: NA Highest grade of school patient has completed: 11the grade Name of school: NA Contact person: NA  Risk to self with the past 6 months Suicidal Ideation: No (Unable to assess/Pt refused to participate) Suicidal Intent: No (Unable to assess/Pt refused to participate) Is patient at risk for suicide?: No (Unable to assess/Pt refused to participate) Suicidal Plan?: No (Unable to assess/Pt refused to participate) Access to Means: No (Unable to assess/Pt refused to participate) What has been your use of drugs/alcohol within the last 12 months?: Pt uses marijuana regularly Previous Attempts/Gestures: No (Unable to assess/Pt refused to participate) How many times?: 0 Other Self Harm  Risks: None identified (Unable to assess/Pt refused to participate) Triggers for Past Attempts: None known Intentional Self Injurious Behavior: None (Unable to assess/Pt refused to participate) Family Suicide History: Unable to assess Recent stressful life event(s): Other (Comment) (Unable to assess/Pt refused to participate) Persecutory voices/beliefs?: No (Unable to assess/Pt refused to participate) Depression: No (Unable to assess/Pt refused to participate) Depression Symptoms: Feeling angry/irritable Substance abuse history and/or treatment for substance abuse?: Yes Suicide prevention information given to non-admitted patients: Not applicable  Risk to Others within the past 6 months Homicidal Ideation: No (Unable to assess/Pt refused to participate) Thoughts of Harm to Others: No (Unable to assess/Pt refused to participate) Current Homicidal Intent: No (Unable to assess/Pt refused to participate) Current Homicidal Plan: No (Unable to assess/Pt refused to participate) Access to Homicidal Means: No (Unable to assess/Pt refused to participate) Identified Victim: Unable to assess/Pt refused to participate History of harm to others?: No (Unable to assess/Pt refused to participate)  Assessment of Violence: None Noted (Unable to assess/Pt refused to participate) Violent Behavior Description: Unable to assess/Pt refused to participate Does patient have access to weapons?: No (Unable to assess/Pt refused to participate) Criminal Charges Pending?: No (Unable to assess/Pt refused to participate) Does patient have a court date: No (Unable to assess/Pt refused to participate)  Psychosis Hallucinations: None noted (Unable to assess/Pt refused to participate) Delusions: None noted (Unable to assess/Pt refused to participate)  Mental Status Report Appear/Hygiene: Disheveled, In scrubs Eye Contact: Poor Motor Activity: Unremarkable Speech: Other (Comment) (Pt refuses to speak) Level of  Consciousness: Quiet/awake Mood: Other (Comment) (Unable to assess/Pt refused to participate) Affect: Constricted Anxiety Level: None (Unable to assess/Pt refused to participate) Panic attack frequency: Unable to assess/Pt refused to participate Most recent panic attack: Unable to assess/Pt refused to participate Thought Processes: Unable to Assess Judgement: Unable to Assess Orientation: Unable to assess Obsessive Compulsive Thoughts/Behaviors: Unable to Assess  Cognitive Functioning Concentration: Unable to Assess Memory: Unable to Assess IQ: Average Insight: Poor Impulse Control: Unable to Assess Appetite: Fair (Unable to assess/Pt refused to participate) Weight Loss:  (Unable to assess/Pt refused to participate) Weight Gain:  (Unable to assess/Pt refused to participate) Sleep: Unable to Assess Vegetative Symptoms: None  ADLScreening [facility] Assessment Services) Patient's cognitive ability adequate to safely complete daily activities?: Yes Patient able to express need for assistance with ADLs?: Yes Independently performs ADLs?: Yes (appropriate for developmental age)  Prior Inpatient Therapy Prior Inpatient Therapy: Yes Prior Therapy Dates: In the last month Prior [facility]ilty[facility] [place] Reason for Treatment: inpt mental health- psychosis  Prior Outpatient Therapy Prior Outpatient Therapy: No Prior Therapy Dates: Unknown Prior Therapy Facilty/Provider(s): Unknown Reason for Treatment: Unknown  ADL Screening (condition at time of admission) Patient's cognitive ability adequate to safely complete daily activities?: Yes Patient able to express need for assistance with ADLs?: Yes Independently performs ADLs?: Yes (appropriate for developmental age)             Advance Directives (For Healthcare) Does patient have an advance directive?: No Would patient like information on creating an advanced directive?: No - patient declined  information    Additional Information 1:1 In Past 12 Months?: No CIRT Risk: No Elopement Risk: No Does patient have medical clearance?: Yes     [facility] will contact [facility] when Pt is willing to participate in assessment.  Disposition Initial Assessment Completed for this Encounter: Yes Disposition of Patient: Other dispositions Other disposition(s): Other (Comment) (Pt to be assessed when willing to participate)   Pamalee LeydenFord Ellis Zakeria Kulzer Jr, [facility], [facility] Triage Specialist [phone]   Pamalee LeydenWarrick Jr, Erian Rosengren Ellis 09/22/2014 3:33 AM

## 2014-09-22 NOTE — BH Assessment (Signed)
[facility] Assessment Progress Note   The following facilities were contacted in an attempt to place the pt by this clinician:  Faxed Referral For [facility] per [facility]  Left Message, No Answer [facility] @ [address] @ [name] @ [address] @ [address]  [facility] [place] per [facility]Kathleen Barn[facility] per [facility]  TTS will continue to seek placement for the pt.  Casimer LaniusKristen Devyn Griffing, MS, [facility] Licensed Professional Counselor Therapeutic Triage Specialist [facility] Phone: [phone] Fax: [phone]

## 2014-09-22 NOTE — BH Assessment (Signed)
Attempted tele-assessment. Pt refused to look at camera and refused to speak. He gave a thumbs up sign when I asked if he would prefer I speak to him after he had some rest. [facility] at [facility] will contact TTS when Pt is ready to participate in tele-assessment.  Harlin RainFord Ellis Ria CommentWarrick Jr, LPC, [facility] Triage Specialist [phone]

## 2014-09-22 NOTE — ED Provider Notes (Signed)
Pt has been to the ED several times the past couple of days. 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   [facility], but returns to get more IVC papers signed.    [name] AlbeIva Kyngston Pickelsimer, MD, Armando GangFACEP   Ward GivensIva L Phyllis Whitefield, MD 09/22/14 [phone]

## 2014-09-22 NOTE — ED Notes (Signed)
Bed: [id number] Expected date: 09/22/14 Expected time: 5:30 PM Means of arrival: Police Comments: From [facility]

## 2014-09-23 ENCOUNTER — Emergency Department (HOSPITAL_COMMUNITY): Payer: Medicare Other

## 2014-09-23 DIAGNOSIS — F312 Bipolar disorder, current episode manic severe with psychotic features: Secondary | ICD-10-CM

## 2014-09-23 MED ORDER — ATORVASTATIN CALCIUM 40 MG PO TABS
40.0000 mg | ORAL_TABLET | Freq: Every day | ORAL | Status: DC
  Administered 2014-09-23 – 2014-10-03 (×9): 40 mg via ORAL
  Filled 2014-09-23 (×13): qty 1

## 2014-09-23 MED ORDER — LORAZEPAM 2 MG/ML IJ SOLN
2.0000 mg | Freq: Once | INTRAMUSCULAR | Status: DC
  Filled 2014-09-23: qty 1

## 2014-09-23 MED ORDER — DIPHENHYDRAMINE HCL 25 MG PO CAPS
50.0000 mg | ORAL_CAPSULE | Freq: Once | ORAL | Status: AC
  Administered 2014-09-23: 50 mg via ORAL

## 2014-09-23 MED ORDER — OLANZAPINE 10 MG PO TBDP
10.0000 mg | ORAL_TABLET | Freq: Three times a day (TID) | ORAL | Status: DC | PRN
  Administered 2014-09-23: 10 mg via ORAL
  Filled 2014-09-23 (×2): qty 1

## 2014-09-23 MED ORDER — ALBUTEROL (5 MG/ML) CONTINUOUS INHALATION SOLN
10.0000 mg/h | INHALATION_SOLUTION | RESPIRATORY_TRACT | Status: DC
  Administered 2014-09-24: 10 mg/h via RESPIRATORY_TRACT
  Filled 2014-09-23: qty 20

## 2014-09-23 MED ORDER — ZIPRASIDONE HCL 20 MG PO CAPS
ORAL_CAPSULE | ORAL | Status: AC
  Filled 2014-09-23: qty 1

## 2014-09-23 MED ORDER — TAMSULOSIN HCL 0.4 MG PO CAPS
0.4000 mg | ORAL_CAPSULE | Freq: Every day | ORAL | Status: DC
  Administered 2014-09-23 – 2014-10-03 (×11): 0.4 mg via ORAL
  Filled 2014-09-23 (×13): qty 1

## 2014-09-23 MED ORDER — ZIPRASIDONE HCL 20 MG PO CAPS
20.0000 mg | ORAL_CAPSULE | Freq: Once | ORAL | Status: AC
  Administered 2014-09-23: 20 mg via ORAL

## 2014-09-23 MED ORDER — METHYLPREDNISOLONE SODIUM SUCC 125 MG IJ SOLR
125.0000 mg | Freq: Once | INTRAMUSCULAR | Status: AC
  Administered 2014-09-24: 125 mg via INTRAVENOUS
  Filled 2014-09-23: qty 2

## 2014-09-23 MED ORDER — DIPHENHYDRAMINE HCL 25 MG PO CAPS
ORAL_CAPSULE | ORAL | Status: AC
  Filled 2014-09-23: qty 2

## 2014-09-23 MED ORDER — LORAZEPAM 1 MG PO TABS
ORAL_TABLET | ORAL | Status: AC
  Administered 2014-09-23: 2 mg
  Filled 2014-09-23: qty 2

## 2014-09-23 MED ORDER — GABAPENTIN 100 MG PO CAPS
100.0000 mg | ORAL_CAPSULE | Freq: Three times a day (TID) | ORAL | Status: DC
  Administered 2014-09-23 – 2014-09-26 (×6): 100 mg via ORAL
  Filled 2014-09-23 (×10): qty 1

## 2014-09-23 NOTE — ED Notes (Signed)
PATIENT REFUSED TO HAVE BLOOD DRAWN.  DEMANDED FOOD. REFUSED TO PUT HOOKED UP TO THE CARDIAC MONITOR.

## 2014-09-23 NOTE — ED Notes (Signed)
Dr. Madilyn Hookees in room to assess patient. MD with orders to move to main ED. Charge nurse aware of request.

## 2014-09-23 NOTE — ED Notes (Signed)
Pt angry and agitated due to male patient yelling. Pt alert and oriented and proceeded to walk into her room and urinated in her trash can. Pt aware of his actions and refused to explain why, but smirked when questioned about behaviors. Pt instructed by staff to remain in his own room and not to enter other's rooms.

## 2014-09-23 NOTE — Consult Note (Signed)
[facility] Face-to-Face Psychiatry Consult   Reason for Consult:  Delusional thinking, bizarre behavior Referring Physician: EDP ALICE VITELLI is an 68 y.o. male. Total Time spent with patient: 45 minutes  Assessment: AXIS I:  Bipolar I disorder, single manic episode, severe, with psychosis  AXIS II:  Deferred AXIS III:   Past Medical History  Diagnosis Date  . 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 COPD (chronic obstructive pulmonary disease)   . Thoracic ascending aortic aneurysm     repaired 06/2014 at [facility]  . COPD (chronic obstructive pulmonary disease)   . BPH (benign prostatic hyperplasia)   . Pacemaker     battery replaced 06/2014 at [facility]  . SSS (sick sinus syndrome)     s/p PPM   . CAD (coronary artery disease)     ?MI in 2007  . Pneumothorax     traumatic 2007  . Anxiety and depression   . Psychosis   . Bipolar disorder    AXIS IV:  economic problems, other psychosocial or environmental problems and problems related to social environment AXIS V:  21-30 behavior considerably influenced by delusions or hallucinations OR serious impairment in judgment, communication OR inability to function in almost all areas  Plan:  Recommend psychiatric Inpatient admission when medically cleared.  Subjective:   [name] NEMETZ is a 68 y.o. male patient admitted with grandiose delusions and bizarre behavior.  HPI: [name] [name] is an 68 y.o. male, Caucasian with history of Bipolar disorder who presents to [facility] ED by Event organiser. Pt was petitioned for IVC by Charolette Forward with DSS [phone]. Per reports, patient has been extremely manic, has not been sleeping for days, having racing thoughts, mood lability, aggressive behavior, hostility and disorganized thought process.. He has been to emergency departments numerous times last week but always left against medical advice. Patient reportedly  had heart surgery recently and has  COPD. He has  Not been using breathing equipment. 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 He also reports that he is working for [employer] and buying illegal drugs. He was recently admitted to [facility] (from 09/07/14-09/12/14) due to aggressiveness and delusional thoughts but has not been taking his prescribed medications or keep his follow up appointment. Patient has no insight into his problem and has been exercising poor judgment. Patient will benefit from inpatient admission.  HPI Elements:   Location:  delusional, mood lability. Quality:  severe. Duration:  one week. Context:  non-compliant .  Past Psychiatric History: Past Medical History  Diagnosis Date  . Severe aortic valve stenosis     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 at [facility]  . COPD (chronic obstructive pulmonary disease)   . Thoracic ascending aortic aneurysm     repaired 06/2014 at [facility]  . COPD (chronic obstructive pulmonary disease)   . BPH (benign prostatic hyperplasia)   . Pacemaker     battery replaced 06/2014 at [facility]  . SSS (sick sinus syndrome)     s/p PPM   . CAD (coronary artery disease)     ?MI in 2007  . Pneumothorax     traumatic 2007  . Anxiety and depression   . Psychosis   . Bipolar disorder     reports that he has quit smoking. He does not have any smokeless tobacco history on file. He reports that he drinks alcohol. He reports that he uses  illicit drugs (Marijuana). Family History  Problem Relation Age of Onset  . CAD Father   . Diabetes Father   . High blood pressure Brother   . Cancer Brother    Family History Substance Abuse:  (Unable to assess/Pt refused to participate) Family Supports: Yes, List: (Two sons. Mikie Misner is his POA.)) Living Arrangements: Alone (Living in a motel) Can pt return to current living arrangement?: No   Allergies:   Allergies  Allergen Reactions  . Tiotropium Bromide Monohydrate Nausea  Only and Anaphylaxis    Tremors    ACT Assessment Complete:  Yes:    Educational Status    Risk to Self: Risk to self with the past 6 months Suicidal Ideation: No (Unable to assess/Pt refused to participate) Suicidal Intent: No (Unable to assess/Pt refused to participate) Is patient at risk for suicide?: No (Unable to assess/Pt refused to participate) Suicidal Plan?: No (Unable to assess/Pt refused to participate) Access to Means: No (Unable to assess/Pt refused to participate) What has been your use of drugs/alcohol within the last 12 months?: Pt uses marijuana regularly Previous Attempts/Gestures: No (Unable to assess/Pt refused to participate) How many times?: 0 Other Self Harm Risks: None identified (Unable to assess/Pt refused to participate) Triggers for Past Attempts: None known Intentional Self Injurious Behavior: None (Unable to assess/Pt refused to participate) Family Suicide History: Unable to assess Recent stressful life event(s): Other (Comment) (Unable to assess/Pt refused to participate) Persecutory voices/beliefs?: No (Unable to assess/Pt refused to participate) Depression: No (Unable to assess/Pt refused to participate) Depression Symptoms: Feeling angry/irritable Substance abuse history and/or treatment for substance abuse?: Yes Suicide prevention information given to non-admitted patients: Not applicable  Risk to Others: Risk to Others within the past 6 months Homicidal Ideation: No (Unable to assess/Pt refused to participate) Thoughts of Harm to Others: No (Unable to assess/Pt refused to participate) Current Homicidal Intent: No (Unable to assess/Pt refused to participate) Current Homicidal Plan: No (Unable to assess/Pt refused to participate) Access to Homicidal Means: No (Unable to assess/Pt refused to participate) Identified Victim: Unable to assess/Pt refused to participate History of harm to others?: No (Unable to assess/Pt refused to participate) Assessment  of Violence: None Noted (Unable to assess/Pt refused to participate) Violent Behavior Description: Unable to assess/Pt refused to participate Does patient have access to weapons?: No (Unable to assess/Pt refused to participate) Criminal Charges Pending?: No (Unable to assess/Pt refused to participate) Does patient have a court date: No (Unable to assess/Pt refused to participate)  Abuse:    Prior Inpatient Therapy: Prior Inpatient Therapy Prior Inpatient Therapy: Yes Prior Therapy Dates: In the last month Prior Therapy Facilty/Provider(s): [facility] in [place] Reason for Treatment: inpt mental health- psychosis  Prior Outpatient Therapy: Prior Outpatient Therapy Prior Outpatient Therapy: No Prior Therapy Dates: Unknown Prior Therapy Facilty/Provider(s): Unknown Reason for Treatment: Unknown  Additional Information: Additional Information 1:1 In Past 12 Months?: No CIRT Risk: No Elopement Risk: No Does patient have medical clearance?: Yes          Objective: Blood pressure 142/72, pulse 78, temperature 98 F (36.7 C), temperature source Oral, resp. rate 20, height 5' 10.5" (1.791 m), weight 74.844 kg (165 lb), SpO2 92 %.Body mass index is 23.33 kg/(m^2). Results for orders placed or performed during the hospital encounter of 09/21/14 (from the past 72 hour(s))  Drug screen panel, emergency     Status: None   Collection Time: 09/22/14 12:10 AM  Result Value Ref Range   Opiates NONE DETECTED NONE  DETECTED   Cocaine NONE DETECTED NONE DETECTED   Benzodiazepines NONE DETECTED NONE DETECTED   Amphetamines NONE DETECTED NONE DETECTED   Tetrahydrocannabinol NONE DETECTED NONE DETECTED   Barbiturates NONE DETECTED NONE DETECTED    Comment:        DRUG SCREEN FOR MEDICAL PURPOSES ONLY.  IF CONFIRMATION IS NEEDED FOR ANY PURPOSE, NOTIFY LAB WITHIN 5 DAYS.        LOWEST DETECTABLE LIMITS FOR URINE DRUG SCREEN Drug Class       Cutoff (ng/mL) Amphetamine       1000 Barbiturate      200 Benzodiazepine   280 Tricyclics       034 Opiates          300 Cocaine          300 THC              50   CBC with Differential     Status: Abnormal   Collection Time: 09/22/14 12:41 AM  Result Value Ref Range   WBC 17.5 (H) 4.0 - 10.5 K/uL   RBC 4.22 4.22 - 5.81 MIL/uL   Hemoglobin 12.4 (L) 13.0 - 17.0 g/dL   HCT 38.6 (L) 39.0 - 52.0 %   MCV 91.5 78.0 - 100.0 fL   MCH 29.4 26.0 - 34.0 pg   MCHC 32.1 30.0 - 36.0 g/dL   RDW 15.6 (H) 11.5 - 15.5 %   Platelets 373 150 - 400 K/uL   Neutrophils Relative % 79 (H) 43 - 77 %   Neutro Abs 13.7 (H) 1.7 - 7.7 K/uL   Lymphocytes Relative 12 12 - 46 %   Lymphs Abs 2.2 0.7 - 4.0 K/uL   Monocytes Relative 9 3 - 12 %   Monocytes Absolute 1.5 (H) 0.1 - 1.0 K/uL   Eosinophils Relative 0 0 - 5 %   Eosinophils Absolute 0.0 0.0 - 0.7 K/uL   Basophils Relative 0 0 - 1 %   Basophils Absolute 0.0 0.0 - 0.1 K/uL  Basic metabolic panel     Status: Abnormal   Collection Time: 09/22/14 12:41 AM  Result Value Ref Range   Sodium 139 137 - 147 mEq/L   Potassium 3.7 3.7 - 5.3 mEq/L   Chloride 97 96 - 112 mEq/L   CO2 30 19 - 32 mEq/L   Glucose, Bld 109 (H) 70 - 99 mg/dL   BUN 18 6 - 23 mg/dL   Creatinine, Ser 0.87 0.50 - 1.35 mg/dL   Calcium 9.5 8.4 - 10.5 mg/dL   GFR calc non Af Amer 87 (L) >90 mL/min   GFR calc Af Amer >90 >90 mL/min    Comment: (NOTE) The eGFR has been calculated using the CKD EPI equation. This calculation has not been validated in all clinical situations. eGFR's persistently <90 mL/min signify possible Chronic Kidney Disease.    Anion gap 12 5 - 15  Ethanol     Status: None   Collection Time: 09/22/14 12:41 AM  Result Value Ref Range   Alcohol, Ethyl (B) <11 0 - 11 mg/dL    Comment:        LOWEST DETECTABLE LIMIT FOR SERUM ALCOHOL IS 11 mg/dL FOR MEDICAL PURPOSES ONLY   Salicylate level     Status: Abnormal   Collection Time: 09/22/14 12:41 AM  Result Value Ref Range   Salicylate Lvl <9.1  (L) 2.8 - 20.0 mg/dL  Acetaminophen level     Status: None   Collection Time: 09/22/14  12:41 AM  Result Value Ref Range   Acetaminophen (Tylenol), Serum <15.0 10 - 30 ug/mL    Comment:        THERAPEUTIC CONCENTRATIONS VARY SIGNIFICANTLY. A RANGE OF 10-30 ug/mL MAY BE AN EFFECTIVE CONCENTRATION FOR MANY PATIENTS. HOWEVER, SOME ARE BEST TREATED AT CONCENTRATIONS OUTSIDE THIS RANGE. ACETAMINOPHEN CONCENTRATIONS >150 ug/mL AT 4 HOURS AFTER INGESTION AND >50 ug/mL AT 12 HOURS AFTER INGESTION ARE OFTEN ASSOCIATED WITH TOXIC REACTIONS.   CBG monitoring, ED     Status: Abnormal   Collection Time: 09/22/14  8:42 PM  Result Value Ref Range   Glucose-Capillary 123 (H) 70 - 99 mg/dL   Labs are reviewed and are pertinent for the above.  Current Facility-Administered Medications  Medication Dose Route Frequency Provider Last Rate Last Dose  . albuterol (PROVENTIL HFA;VENTOLIN HFA) 108 (90 BASE) MCG/ACT inhaler 1-2 puff  1-2 puff Inhalation Q2H PRN Orpah Greek, MD   2 puff at 09/23/14 0545  . albuterol (PROVENTIL) (2.5 MG/3ML) 0.083% nebulizer solution 2.5 mg  2.5 mg Nebulization Q4H PRN Orpah Greek, MD   2.5 mg at 09/22/14 1630  . aspirin chewable tablet 81 mg  81 mg Oral Daily Mariea Clonts, MD   81 mg at 09/23/14 1028  . atorvastatin (LIPITOR) tablet 40 mg  40 mg Oral q1800 Ondine Gemme      . furosemide (LASIX) tablet 40 mg  40 mg Oral Daily Mariea Clonts, MD   40 mg at 09/23/14 1028  . gabapentin (NEURONTIN) capsule 100 mg  100 mg Oral TID Jaiana Sheffer      . ipratropium-albuterol (DUONEB) 0.5-2.5 (3) MG/3ML nebulizer solution 3 mL  3 mL Nebulization Q4H Orpah Greek, MD   3 mL at 09/23/14 1143  . LORazepam (ATIVAN) injection 2 mg  2 mg Intramuscular Once Yeraldi Fidler   2 mg at 09/23/14 1131  . mometasone-formoterol (DULERA) 200-5 MCG/ACT inhaler 2 puff  2 puff Inhalation BID Mariea Clonts, MD   2 puff at 09/23/14 [phone]  . OLANZapine zydis  (ZYPREXA) disintegrating tablet 10 mg  10 mg Oral Q8H PRN Maude Gloor      . ondansetron (ZOFRAN-ODT) disintegrating tablet 4 mg  4 mg Oral Q8H PRN Orpah Greek, MD   4 mg at 09/22/14 1058  . tamsulosin (FLOMAX) capsule 0.4 mg  0.4 mg Oral QPC supper Felicite Zeimet       Current Outpatient Prescriptions  Medication Sig Dispense Refill  . albuterol (PROVENTIL HFA;VENTOLIN HFA) 108 (90 BASE) MCG/ACT inhaler Inhale 2 puffs into the lungs every 4 (four) hours as needed. 6.7 g 0  . aspirin 81 MG tablet Take 1 tablet (81 mg total) by mouth daily. 30 tablet   . furosemide (LASIX) 40 MG tablet Take 1 tablet (40 mg total) by mouth daily. 30 tablet   . ipratropium-albuterol (DUONEB) 0.5-2.5 (3) MG/3ML SOLN Take 3 mLs by nebulization every 4 (four) hours as needed. Shortness of breath/wheezing 360 mL 0  . mometasone-formoterol (DULERA) 200-5 MCG/ACT AERO Inhale 2 puffs into the lungs 2 (two) times daily.    . predniSONE (DELTASONE) 20 MG tablet Take 3 tablets (60 mg total) by mouth daily. 15 tablet 0  . gabapentin (NEURONTIN) 100 MG capsule Take 2 capsules (200 mg total) by mouth 3 (three) times daily. (Patient not taking: Reported on 09/18/2014) 180 capsule 0  . traZODone (DESYREL) 50 MG tablet Take 1 tablet (50 mg total) by mouth at bedtime as needed for sleep. (  Patient not taking: Reported on 09/18/2014) 30 tablet 0    Psychiatric Specialty Exam:     Blood pressure 142/72, pulse 78, temperature 98 F (36.7 C), temperature source Oral, resp. rate 20, height 5' 10.5" (1.791 m), weight 74.844 kg (165 lb), SpO2 92 %.Body mass index is 23.33 kg/(m^2).  General Appearance: Disheveled  Eye Contact::  Minimal  Speech:  Garbled  Volume:  Increased  Mood:  Angry and Irritable  Affect:  Labile  Thought Process:  Circumstantial and Disorganized  Orientation:  Full (Time, Place, and Person)  Thought Content:  Delusions and grandiose  Suicidal Thoughts:  No  Homicidal Thoughts:  No  Memory:   Immediate;   Fair Recent;   Fair Remote;   Fair  Judgement:  Impaired  Insight:  Lacking  Psychomotor Activity:  Increased  Concentration:  Poor  Recall:  [place]  Language: Good  Akathisia:  No  Handed:  Right  AIMS (if indicated):     Assets:  Communication Skills  Sleep:   poor   Musculoskeletal: Strength & Muscle Tone: within normal limits Gait & Station: normal Patient leans: N/A  Treatment Plan Summary: Daily contact with patient to assess and evaluate symptoms and progress in treatment Medication management Patient will benefit from inpatient admission  Corena Pilgrim, MD 09/23/2014 1:33 PM

## 2014-09-23 NOTE — ED Notes (Signed)
During his breathing treatment, pt threw his respiratory medications on the floor and screamed and cursed at the RT. Pt yelling "I want my goddamned water!" Security in to intervene and redirect pt's behaviors. Pt remains labile and cursing under his breath. No distress noted.

## 2014-09-23 NOTE — ED Notes (Signed)
Pt yelling loudly and telling RN to call his grandmother and grandfather. Pt with pursed-lipped breathing and increased repspirations. MD aware

## 2014-09-23 NOTE — ED Notes (Signed)
Bed: [id number] Expected date:  Expected time:  Means of arrival:  Comments: rm 42

## 2014-09-23 NOTE — ED Notes (Signed)
Patient confused, stripping off all of his clothing and urinating in the floor. When pt questioned for orientation, pt screaming, cursing and waving his middle finger in RN's face. Pt with labored breathing, Respiratory Therapist in room to assess. Dr. Madilyn Hookees notified of change in status and confusion. MD to assess.

## 2014-09-23 NOTE — Progress Notes (Signed)
Pt extremely combative during treatment.  Treatment ran for approximately 2 minutes before he threw it in the floor and started screaming at the top of his lungs that he wanted some "God Damn Water."  The Pt got up and began walking out the room, at which security intervened and got the Pt under control.

## 2014-09-23 NOTE — ED Notes (Signed)
Report given to JohnsburgLilibeth, Charity fundraiserN. Pt placed in room 24.

## 2014-09-23 NOTE — BH Assessment (Signed)
Dr. Thedore MinsMojeed Akintayo recommends inpatient geriatric-psychiatry placement. Contacted the following facilities:  BED AVAILABLE, [facility] CLINICAL INFORMATION[facility], per Marylene LandAngela  INFORMATION RECEIVED, PT UNDER REVIEW: [facility], per Dot LanesKrista  [facility]AC[facility]: [facility], [facility], per [facility], per [facility], per Erskine SquibbJane  PT DECLINED: [facility]  NO RESPONSE: [facility]   [address]Cleone Hulick Ellis Patsy BaltimoreWarrick Jr, WisconsinLPC, [facility] Triage Specialist [phone]

## 2014-09-23 NOTE — ED Notes (Addendum)
Pt cursing at staff, talking loud, redirected to room.

## 2014-09-23 NOTE — BHH Counselor (Addendum)
Magistrate Broadus JohnWarren confirms receipt of IVC paperwork. Originals placed in IVC NB in SAPPU and copies placed in pt's chart. Writer had [facility] but it was incorrect, so pt was placed under IVC again today.   Evette Cristalaroline Paige Nicholas Oss[place], [place]CS[place] Assessment Counselor

## 2014-09-24 ENCOUNTER — Encounter (HOSPITAL_COMMUNITY): Payer: Self-pay | Admitting: Internal Medicine

## 2014-09-24 ENCOUNTER — Other Ambulatory Visit: Payer: Self-pay

## 2014-09-24 DIAGNOSIS — R946 Abnormal results of thyroid function studies: Secondary | ICD-10-CM | POA: Diagnosis present

## 2014-09-24 DIAGNOSIS — J441 Chronic obstructive pulmonary disease with (acute) exacerbation: Principal | ICD-10-CM

## 2014-09-24 DIAGNOSIS — R0602 Shortness of breath: Secondary | ICD-10-CM | POA: Diagnosis present

## 2014-09-24 DIAGNOSIS — I2510 Atherosclerotic heart disease of native coronary artery without angina pectoris: Secondary | ICD-10-CM | POA: Diagnosis present

## 2014-09-24 DIAGNOSIS — F3110 Bipolar disorder, current episode manic without psychotic features, unspecified: Secondary | ICD-10-CM

## 2014-09-24 DIAGNOSIS — Z950 Presence of cardiac pacemaker: Secondary | ICD-10-CM | POA: Diagnosis not present

## 2014-09-24 DIAGNOSIS — Z953 Presence of xenogenic heart valve: Secondary | ICD-10-CM | POA: Diagnosis not present

## 2014-09-24 DIAGNOSIS — I4581 Long QT syndrome: Secondary | ICD-10-CM | POA: Diagnosis present

## 2014-09-24 DIAGNOSIS — F1010 Alcohol abuse, uncomplicated: Secondary | ICD-10-CM | POA: Diagnosis present

## 2014-09-24 DIAGNOSIS — J189 Pneumonia, unspecified organism: Secondary | ICD-10-CM

## 2014-09-24 DIAGNOSIS — Z87891 Personal history of nicotine dependence: Secondary | ICD-10-CM | POA: Diagnosis not present

## 2014-09-24 DIAGNOSIS — Z9119 Patient's noncompliance with other medical treatment and regimen: Secondary | ICD-10-CM | POA: Diagnosis present

## 2014-09-24 DIAGNOSIS — R0902 Hypoxemia: Secondary | ICD-10-CM | POA: Diagnosis present

## 2014-09-24 DIAGNOSIS — Z7982 Long term (current) use of aspirin: Secondary | ICD-10-CM | POA: Diagnosis not present

## 2014-09-24 DIAGNOSIS — F1290 Cannabis use, unspecified, uncomplicated: Secondary | ICD-10-CM | POA: Diagnosis present

## 2014-09-24 DIAGNOSIS — Z954 Presence of other heart-valve replacement: Secondary | ICD-10-CM

## 2014-09-24 DIAGNOSIS — Y95 Nosocomial condition: Secondary | ICD-10-CM | POA: Diagnosis present

## 2014-09-24 DIAGNOSIS — F3113 Bipolar disorder, current episode manic without psychotic features, severe: Secondary | ICD-10-CM | POA: Diagnosis present

## 2014-09-24 DIAGNOSIS — Z952 Presence of prosthetic heart valve: Secondary | ICD-10-CM

## 2014-09-24 DIAGNOSIS — F302 Manic episode, severe with psychotic symptoms: Secondary | ICD-10-CM

## 2014-09-24 DIAGNOSIS — I5031 Acute diastolic (congestive) heart failure: Secondary | ICD-10-CM

## 2014-09-24 DIAGNOSIS — I5033 Acute on chronic diastolic (congestive) heart failure: Secondary | ICD-10-CM | POA: Diagnosis present

## 2014-09-24 LAB — CBC WITH DIFFERENTIAL/PLATELET
Basophils Absolute: 0 10*3/uL (ref 0.0–0.1)
Basophils Absolute: 0 10*3/uL (ref 0.0–0.1)
Basophils Relative: 0 % (ref 0–1)
Basophils Relative: 0 % (ref 0–1)
Eosinophils Absolute: 0 10*3/uL (ref 0.0–0.7)
Eosinophils Absolute: 0 10*3/uL (ref 0.0–0.7)
Eosinophils Relative: 0 % (ref 0–5)
Eosinophils Relative: 0 % (ref 0–5)
HCT: 39.2 % (ref 39.0–52.0)
HCT: 40.8 % (ref 39.0–52.0)
Hemoglobin: 12.1 g/dL — ABNORMAL LOW (ref 13.0–17.0)
Hemoglobin: 12.5 g/dL — ABNORMAL LOW (ref 13.0–17.0)
Lymphocytes Relative: 22 % (ref 12–46)
Lymphocytes Relative: 7 % — ABNORMAL LOW (ref 12–46)
Lymphs Abs: 0.9 10*3/uL (ref 0.7–4.0)
Lymphs Abs: 2.9 10*3/uL (ref 0.7–4.0)
MCH: 29.1 pg (ref 26.0–34.0)
MCH: 29.2 pg (ref 26.0–34.0)
MCHC: 30.6 g/dL (ref 30.0–36.0)
MCHC: 30.9 g/dL (ref 30.0–36.0)
MCV: 94.7 fL (ref 78.0–100.0)
MCV: 94.9 fL (ref 78.0–100.0)
Monocytes Absolute: 0.3 10*3/uL (ref 0.1–1.0)
Monocytes Absolute: 1.4 10*3/uL — ABNORMAL HIGH (ref 0.1–1.0)
Monocytes Relative: 10 % (ref 3–12)
Monocytes Relative: 2 % — ABNORMAL LOW (ref 3–12)
Neutro Abs: 12.1 10*3/uL — ABNORMAL HIGH (ref 1.7–7.7)
Neutro Abs: 9.3 10*3/uL — ABNORMAL HIGH (ref 1.7–7.7)
Neutrophils Relative %: 68 % (ref 43–77)
Neutrophils Relative %: 91 % — ABNORMAL HIGH (ref 43–77)
Platelets: 395 10*3/uL (ref 150–400)
Platelets: 406 10*3/uL — ABNORMAL HIGH (ref 150–400)
RBC: 4.14 MIL/uL — ABNORMAL LOW (ref 4.22–5.81)
RBC: 4.3 MIL/uL (ref 4.22–5.81)
RDW: 15.8 % — ABNORMAL HIGH (ref 11.5–15.5)
RDW: 15.9 % — ABNORMAL HIGH (ref 11.5–15.5)
WBC: 13.3 10*3/uL — ABNORMAL HIGH (ref 4.0–10.5)
WBC: 13.7 10*3/uL — ABNORMAL HIGH (ref 4.0–10.5)

## 2014-09-24 LAB — COMPREHENSIVE METABOLIC PANEL
ALT: 40 U/L (ref 0–53)
AST: 29 U/L (ref 0–37)
Albumin: 3 g/dL — ABNORMAL LOW (ref 3.5–5.2)
Alkaline Phosphatase: 95 U/L (ref 39–117)
Anion gap: 11 (ref 5–15)
BUN: 16 mg/dL (ref 6–23)
CO2: 31 mEq/L (ref 19–32)
Calcium: 9.3 mg/dL (ref 8.4–10.5)
Chloride: 103 mEq/L (ref 96–112)
Creatinine, Ser: 0.8 mg/dL (ref 0.50–1.35)
GFR calc Af Amer: >90 mL/min (ref >90)
GFR calc non Af Amer: 90 mL/min — ABNORMAL LOW (ref >90)
Glucose, Bld: 210 mg/dL — ABNORMAL HIGH (ref 70–99)
Potassium: 4.6 mEq/L (ref 3.7–5.3)
Sodium: 145 mEq/L (ref 137–147)
Total Bilirubin: <0.2 mg/dL — ABNORMAL LOW (ref 0.3–1.2)
Total Protein: 7.3 g/dL (ref 6.0–8.3)

## 2014-09-24 LAB — BASIC METABOLIC PANEL
Anion gap: 10 (ref 5–15)
BUN: 17 mg/dL (ref 6–23)
CO2: 33 mEq/L — ABNORMAL HIGH (ref 19–32)
Calcium: 9.4 mg/dL (ref 8.4–10.5)
Chloride: 104 mEq/L (ref 96–112)
Creatinine, Ser: 1.06 mg/dL (ref 0.50–1.35)
GFR calc Af Amer: 81 mL/min — ABNORMAL LOW (ref >90)
GFR calc non Af Amer: 70 mL/min — ABNORMAL LOW (ref >90)
Glucose, Bld: 119 mg/dL — ABNORMAL HIGH (ref 70–99)
Potassium: 4.5 mEq/L (ref 3.7–5.3)
Sodium: 147 mEq/L (ref 137–147)

## 2014-09-24 LAB — TSH: TSH: 0.053 u[IU]/mL — ABNORMAL LOW (ref 0.350–4.500)

## 2014-09-24 LAB — PRO B NATRIURETIC PEPTIDE: Pro B Natriuretic peptide (BNP): 798.5 pg/mL — ABNORMAL HIGH (ref 0–125)

## 2014-09-24 LAB — TROPONIN I
Troponin I: <0.3 ng/mL (ref <0.30)
Troponin I: <0.3 ng/mL (ref <0.30)

## 2014-09-24 MED ORDER — LORAZEPAM 2 MG/ML IJ SOLN
1.0000 mg | Freq: Once | INTRAMUSCULAR | Status: AC
  Administered 2014-09-24: 1 mg via INTRAMUSCULAR
  Filled 2014-09-24: qty 1

## 2014-09-24 MED ORDER — ALBUTEROL SULFATE (2.5 MG/3ML) 0.083% IN NEBU
2.5000 mg | INHALATION_SOLUTION | RESPIRATORY_TRACT | Status: DC
  Administered 2014-09-24 – 2014-09-27 (×17): 2.5 mg via RESPIRATORY_TRACT
  Filled 2014-09-24 (×18): qty 3

## 2014-09-24 MED ORDER — VANCOMYCIN HCL IN DEXTROSE 1-5 GM/200ML-% IV SOLN
1000.0000 mg | Freq: Two times a day (BID) | INTRAVENOUS | Status: DC
  Filled 2014-09-24: qty 200

## 2014-09-24 MED ORDER — LORAZEPAM 2 MG/ML IJ SOLN
1.0000 mg | Freq: Once | INTRAMUSCULAR | Status: AC
  Administered 2014-09-24: 1 mg via INTRAVENOUS
  Filled 2014-09-24: qty 1

## 2014-09-24 MED ORDER — ALBUTEROL SULFATE (2.5 MG/3ML) 0.083% IN NEBU
2.5000 mg | INHALATION_SOLUTION | RESPIRATORY_TRACT | Status: DC | PRN
  Administered 2014-09-26 – 2014-10-04 (×19): 2.5 mg via RESPIRATORY_TRACT
  Filled 2014-09-24 (×19): qty 3

## 2014-09-24 MED ORDER — LORAZEPAM 2 MG/ML IJ SOLN
1.0000 mg | Freq: Once | INTRAMUSCULAR | Status: AC
  Administered 2014-09-24: 1 mg via INTRAMUSCULAR

## 2014-09-24 MED ORDER — LORAZEPAM 1 MG PO TABS: 1.0000 mg | ORAL_TABLET | Freq: Four times a day (QID) | ORAL | Status: DC | PRN

## 2014-09-24 MED ORDER — PNEUMOCOCCAL VAC POLYVALENT 25 MCG/0.5ML IJ INJ
0.5000 mL | INJECTION | INTRAMUSCULAR | Status: DC
  Filled 2014-09-24 (×3): qty 0.5

## 2014-09-24 MED ORDER — ADULT MULTIVITAMIN W/MINERALS CH
1.0000 | ORAL_TABLET | Freq: Every day | ORAL | Status: DC
  Administered 2014-09-26 – 2014-10-04 (×8): 1 via ORAL
  Filled 2014-09-24 (×11): qty 1

## 2014-09-24 MED ORDER — LORAZEPAM 2 MG/ML IJ SOLN
1.0000 mg | Freq: Four times a day (QID) | INTRAMUSCULAR | Status: DC | PRN
  Administered 2014-09-24: 1 mg via INTRAVENOUS
  Filled 2014-09-24: qty 1

## 2014-09-24 MED ORDER — FUROSEMIDE 10 MG/ML IJ SOLN
40.0000 mg | Freq: Every day | INTRAMUSCULAR | Status: DC
  Administered 2014-09-24: 40 mg via INTRAVENOUS
  Filled 2014-09-24: qty 4

## 2014-09-24 MED ORDER — CETYLPYRIDINIUM CHLORIDE 0.05 % MT LIQD
7.0000 mL | Freq: Two times a day (BID) | OROMUCOSAL | Status: DC
  Administered 2014-09-24 – 2014-10-02 (×8): 7 mL via OROMUCOSAL

## 2014-09-24 MED ORDER — ONDANSETRON HCL 4 MG PO TABS: 4.0000 mg | ORAL_TABLET | Freq: Four times a day (QID) | ORAL | Status: DC | PRN

## 2014-09-24 MED ORDER — INFLUENZA VAC SPLIT QUAD 0.5 ML IM SUSY
0.5000 mL | PREFILLED_SYRINGE | INTRAMUSCULAR | Status: DC
  Filled 2014-09-24 (×3): qty 0.5

## 2014-09-24 MED ORDER — DEXTROSE 5 % IV SOLN
2.0000 g | Freq: Two times a day (BID) | INTRAVENOUS | Status: DC
  Administered 2014-09-24 – 2014-09-25 (×3): 2 g via INTRAVENOUS
  Filled 2014-09-24 (×4): qty 2

## 2014-09-24 MED ORDER — SODIUM CHLORIDE 0.9 % IJ SOLN
3.0000 mL | Freq: Two times a day (BID) | INTRAMUSCULAR | Status: DC
  Administered 2014-09-24: 3 mL via INTRAVENOUS

## 2014-09-24 MED ORDER — ACETAMINOPHEN 325 MG PO TABS
650.0000 mg | ORAL_TABLET | Freq: Four times a day (QID) | ORAL | Status: DC | PRN
  Administered 2014-10-02 – 2014-10-03 (×3): 650 mg via ORAL
  Filled 2014-09-24 (×3): qty 2

## 2014-09-24 MED ORDER — ENOXAPARIN SODIUM 40 MG/0.4ML ~~LOC~~ SOLN
40.0000 mg | SUBCUTANEOUS | Status: DC
  Administered 2014-09-24: 40 mg via SUBCUTANEOUS
  Filled 2014-09-24 (×3): qty 0.4

## 2014-09-24 MED ORDER — THIAMINE HCL 100 MG/ML IJ SOLN
100.0000 mg | Freq: Every day | INTRAMUSCULAR | Status: DC
  Filled 2014-09-24 (×11): qty 1

## 2014-09-24 MED ORDER — FUROSEMIDE 20 MG PO TABS
20.0000 mg | ORAL_TABLET | Freq: Every day | ORAL | Status: DC
  Administered 2014-09-26 – 2014-10-02 (×7): 20 mg via ORAL
  Filled 2014-09-24 (×6): qty 1

## 2014-09-24 MED ORDER — VITAMIN B-1 100 MG PO TABS
100.0000 mg | ORAL_TABLET | Freq: Every day | ORAL | Status: DC
  Administered 2014-09-26 – 2014-10-04 (×9): 100 mg via ORAL
  Filled 2014-09-24 (×11): qty 1

## 2014-09-24 MED ORDER — DIVALPROEX SODIUM ER 500 MG PO TB24
1000.0000 mg | ORAL_TABLET | Freq: Every day | ORAL | Status: DC
  Administered 2014-09-24 – 2014-09-29 (×4): 1000 mg via ORAL
  Filled 2014-09-24 (×7): qty 2

## 2014-09-24 MED ORDER — ACETAMINOPHEN 650 MG RE SUPP: 650.0000 mg | Freq: Four times a day (QID) | RECTAL | Status: DC | PRN

## 2014-09-24 MED ORDER — LORAZEPAM 2 MG/ML IJ SOLN
1.0000 mg | INTRAMUSCULAR | Status: DC | PRN
  Administered 2014-09-24 – 2014-09-25 (×6): 1 mg via INTRAVENOUS
  Filled 2014-09-24 (×6): qty 1

## 2014-09-24 MED ORDER — BUDESONIDE 0.25 MG/2ML IN SUSP
0.2500 mg | Freq: Two times a day (BID) | RESPIRATORY_TRACT | Status: DC
  Administered 2014-09-24 – 2014-10-04 (×21): 0.25 mg via RESPIRATORY_TRACT
  Filled 2014-09-24 (×21): qty 2

## 2014-09-24 MED ORDER — FOLIC ACID 1 MG PO TABS
1.0000 mg | ORAL_TABLET | Freq: Every day | ORAL | Status: DC
  Administered 2014-09-26 – 2014-10-04 (×9): 1 mg via ORAL
  Filled 2014-09-24 (×11): qty 1

## 2014-09-24 MED ORDER — SODIUM CHLORIDE 0.9 % IJ SOLN
3.0000 mL | Freq: Two times a day (BID) | INTRAMUSCULAR | Status: DC
  Administered 2014-10-01: 3 mL via INTRAVENOUS

## 2014-09-24 MED ORDER — ONDANSETRON HCL 4 MG/2ML IJ SOLN
4.0000 mg | Freq: Four times a day (QID) | INTRAMUSCULAR | Status: DC | PRN
Start: 2014-09-24 — End: 2014-09-25

## 2014-09-24 NOTE — Progress Notes (Signed)
Patient is very confused, very agitated and combative. He is uncooperative and won't follow commands. Ativan 1 mg IV given with no effect He pulled his telemetry out. Security is up here for safety. [name] [name] was made aware. His telemetry monitor is standby for now, tele tech was informed.

## 2014-09-24 NOTE — ED Provider Notes (Signed)
Patient examined after receiving continuous hour-long nebulizer treatment, he has persistent hypoxia to 89% on room air, persistent prolonged expiratory phase and diffuse expiratory wheezing in all lung fields. He has received steroids, he will need admission to the hospital for COPD exacerbation. Discussed with hospitalist who will admit.  [name] RollerBrian D [name], MD 09/24/14 [phone]

## 2014-09-24 NOTE — Plan of Care (Signed)
Problem: Phase I Progression Outcomes Goal: Pain controlled with appropriate interventions Outcome: Completed/Met Date Met:  09/24/14 Goal: Initial discharge plan identified Outcome: Completed/Met Date Met:  09/24/14 Goal: Voiding-avoid urinary catheter unless indicated Outcome: Completed/Met Date Met:  09/24/14 Goal: Hemodynamically stable Outcome: Completed/Met Date Met:  09/24/14

## 2014-09-24 NOTE — Progress Notes (Signed)
Patient remained combative and agitated despite doses of ativan. He wont lay still on bed and won't keep the oxygen cannula in place and he is still very short of breath. Patient is hard to control even with the presence of security personnels. Patient is placed on bilateral arms and ankle restraints. Sitter at bedside now.

## 2014-09-24 NOTE — ED Notes (Addendum)
Pt. Uncooperative, unable to obtain V/S , wont keep BP cuff in placed and pulse ox monitor in placed.

## 2014-09-24 NOTE — Progress Notes (Signed)
PROGRESS NOTE  [name] [name] WUJ:[id number] DOB: 10/20/1946 DOA: 09/21/2014 PCP: No PCP Per Patient  Summary: 68 year old man with recurrent severe manic and psychotic features presented to the ER for the seventh time in the last month 11/25. He presented with IVC papers. He was noted to be delusional at hotel, displayed belligerent behavior in ED, requiring IM injection, police restraint and shackling to the bed. Transferred to [facility] ED. 11/27 Psychiatrist recommended inpatient psych treatment. He was extremely uncooperative and transferred to the emergency department for shortness of breath. There he was found to have oxygen saturation 89% on room air with diffuse wheezes. According to admission documentation he appeared mildly short of breath. He was admitted for COPD exacerbation, possible mild CHF, possible pneumonia.  Assessment/Plan: 1. Bipolar disorder with recurrent severe manic, psychotic features. Depakote per psychiatry, Ativan as needed. Trazodone at night. 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 No focal deficits noted. CT head negative 06/2014 at [facility] (no MRI/pacemaker). 2. Mild COPD exacerbation appears resolved at this point. Given confusion yesterday will try to avoid steroids given his clinical improvement. 3. Possible mild acute diastolic heart failure. This appears resolved at this point. Plan transition to oral Lasix. 4. Possible pneumonia. Not clearly hypoxic, was documented to have 99% oxygen saturation on room air yesterday. Plan to wean oxygen. Exam, laboratory findings and history not suggestive of multi drug resistant organism infection or severe infection at this time. Narrow antibiotics to cefepime. 5. TSH low--check T3, T4 to confirm/rule-out overt hyperthyroidism. No tachycardia or other features of hyperthyroidism. 6. Status post aortic valve replacementin thoracic aortic aneurysm repair  at [facility]  September 2015 not on anticoagulation.  postoperative course was complicated by mania thought secondary to dementia with behavorial disturbance. Continue aspirin, statin. 7. Status post pacemaker placement; battery replaced September 2015 and the patient presented at [facility] with complete heart block because the battery had run out and the patient failed to follow-up.  8. Prolonged QTc. Avoid antipsychotics.   Overall medically improving with apparent resolution of COPD exacerbation, improvement in diastolic heart failure and possible pneumonia. Plan to narrow antibiotics. Change to oral diuretics. Avoid steroids at this point given clinical improvement and psychosis.  In regard to psychotic features, limited information is available. Patient new to health system. Records reviewed from [facility], September of this year the patient had an episode of mania which spontaneously resolved. Psychiatry at that time suspected dementia with behavioral disturbance rather than bipolar disorder. I have attempted to reach the patient's son that the only contact number given but there is no answer. Hopefully he can provide further information is to whether there is a known psychiatric history or history of dementia.  TSH depressed. Not tachycardic. Plan check T3, T4. If elevated would consider methimazole. Not clear the clinical significance of these findings at this point although they could potentially explain psychosis. However given his lack of sympathetic signs this is doubted.  Wean oxygen   Code Status: full code DVT prophylaxis: Lovenox Family Communication:  Disposition Plan: [facility]  Brendia Sacks, MD  [facility]  Pager [phone] If 7PM-7AM, please contact night-coverage at [email or web address], password [facility] 09/24/2014, 7:31 AM  LOS: 3 days   Consultants:  Psychiatry   Procedures:    Antibiotics:  Cefepime  Vancomycin   HPI/Subjective: Combative and agitated consistent with  previous history. Distractable per nursing, but intermittently becomes loud. No tremors or signs of alcohol withdrawal.  Treated with Ativan  earlier, wakes up, speech pressured.   Objective: Filed Vitals:   09/24/14 0330 09/24/14 0339 09/24/14 0439 09/24/14 0540  BP:  115/77 113/76   Pulse: 100 88 102   Temp:  97.4 F (36.3 C) 97.3 F (36.3 C)   TempSrc:  Axillary Axillary   Resp:  20 24   Height:   5\' 11"  (1.803 m)   Weight:   70.489 kg (155 lb 6.4 oz) 70.489 kg (155 lb 6.4 oz)  SpO2: 100% 97% 100%     Intake/Output Summary (Last 24 hours) at 09/24/14 0731 Last data filed at 09/24/14 0720  Gross per 24 hour  Intake     50 ml  Output    450 ml  Net   -400 ml     Filed Weights   09/21/14 2326 09/24/14 0439 09/24/14 0540  Weight: 74.844 kg (165 lb) 70.489 kg (155 lb 6.4 oz) 70.489 kg (155 lb 6.4 oz)    Exam:     Afebrile, VSS, SpO2 97% on 4 L, 100% on 3 L  General: Appears calm, comfortable currently.  Psych: Alert to voice. Confused.  CV: Regular rate and rhythm. No murmur, rub or gallop. No lower extremity edema.  Respiratory: Clear to auscultation bilaterally. No wheezes, rales or rhonchi. Normal respiratory effort.  Abdomen: Soft  Musculoskeletal: Grossly unremarkable  Data Reviewed:  UOP 950.  Complete metabolic panel unremarkable  Troponins negative    BNP mildly elevated 798  Mild leukocytosis without significant change. Noted to be elevated last several times as well. Has been on steroids.  Chest x-ray mild vascular congestion. Suspect left base atelectasis.  Scheduled Meds: . albuterol  2.5 mg Nebulization Q4H  . aspirin  81 mg Oral Daily  . atorvastatin  40 mg Oral q1800  . budesonide (PULMICORT) nebulizer solution  0.25 mg Nebulization BID  . ceFEPime (MAXIPIME) IV  2 g Intravenous Q12H  . enoxaparin (LOVENOX) injection  40 mg Subcutaneous Q24H  . folic acid  1 mg Oral Daily  . furosemide  40 mg Intravenous Daily  . gabapentin  100 mg  Oral TID  . [START ON 09/25/2014] Influenza vac split quadrivalent PF  0.5 mL Intramuscular Tomorrow-1000  . multivitamin with minerals  1 tablet Oral Daily  . [START ON 09/25/2014] pneumococcal 23 valent vaccine  0.5 mL Intramuscular Tomorrow-1000  . sodium chloride  3 mL Intravenous Q12H  . sodium chloride  3 mL Intravenous Q12H  . tamsulosin  0.4 mg Oral QPC supper  . thiamine  100 mg Oral Daily   Or  . thiamine  100 mg Intravenous Daily  . vancomycin  1,000 mg Intravenous Q12H   Continuous Infusions:   Principal Problem:   COPD exacerbation Active Problems:   Bipolar I disorder, single manic episode, severe, with psychosis   H/O aortic valve replacement   History of permanent cardiac pacemaker placement   SOB (shortness of breath)   Time spent 45 minutes, greater than 50% in record gathering and coordination of care. Discussed with psychiatry.  Summary: ER 11/5-11/9: IVC placed for psychosis at [facility]. Psychiatry recommended inpatient treatment. Patient with combativeness required Ativan, Haldol, Geodon, police presence and shackling to bed. [facility]  11/9-11/16 hospitalized for bipolar disorder, recurrent, severe, manic with psychotic features. Antipsychotics were avoided because of QT C elevation. Treated with Depakote ER 1000 mg QHS, Trazodone 50 mg QHS PRN sleep and Ativan 0.5 mg as needed to address agitation and mania.  ER 11/18 presented  with chest pain. Patient left. 11/22 complaint of shortness of breath, cough, ran out of inhaler. Discharged with prescription for inhaler. 11/22 second visit same day for albuterol neb Rx. Left AMA, verbally abusive. 11/24 SOB. Left AMA. Refused interventions. 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 Treated for COPD exacerbation

## 2014-09-24 NOTE — H&P (Signed)
[facility] History and Physical  [name] Booksndrew H Santoni YQM:[id number] DOB: 10/23/1946 DOA: 09/21/2014  Referring physician: ER physician. PCP: No PCP Per Patient   Most of the history is obtained from ER physician and records as patient has history of bipolar disorder and does not provide much history. I tried to reach patient's son thru the phone but was unable to.  Chief Complaint: Shortness of breath.  HPI: [name] [name] is a 68 y.o. male with history of bipolar disorder, sick sinus syndrome status post pacemaker placement, history of bioprosthetic aortic valve replacement, history of ascending thoracic aneurysm status post repair, COPD, history of alcohol abuse and tobacco abuse was only brought to the ER on 25th as patient was found to be trespassing and further records patient was in jail. While in the ER patient was found to be manic and was transferred to behavioral health. In Behavioral Health patient was found to be wheezing and was transferred back to the ER last night. Chest x-ray shows congestion and possible pneumonia and on exam in the ER as per the ER physician patient was wheezing and was placed on nebulizer and steroids. Patient on my exam provides very minimal history and denies any chest pain. Patient is not in acute distress though appears mildly short of breath. He does not have any peripheral edema. As per the records in care everywhere [facility] exacerbation. At that time patient had a 2-D echo results of which are below.   2D ECHO RESULTS. NORMAL LEFT VENTRICULAR FUNCTION WITH MILD LVH NORMAL RIGHT VENTRICULAR SYSTOLIC FUNCTION VALVULAR REGURGITATION: TRIVIAL AR, TRIVIAL MR, TRIVIAL PR, TRIVIAL TR PROSTHETIC VALVE(S): BIOPROSTHETIC AoV NORMAL OVERALL LEFT VENTRICULAR SYSTOLIC FUNCTION WITH MILD DISTAL SEPTAL HYPOKINESIS NO PRIOR STUDY FOR COMPARISON (Report version 3.0) Interpreted and Electronically signed Perform. by:  Dewain PenningAdi Levy, RCS, RCCS by: Chip BoerPamela Susan Douglas, MD Resp.Person: Percell Locusshlee [name], RDCS On: 07/21/2014 16:38:06   Review of Systems: As presented in the history of presenting illness, rest negative.  Past Medical History  Diagnosis Date  . Severe aortic valve stenosis     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  . COPD (chronic obstructive pulmonary disease)   . Thoracic ascending aortic aneurysm     [facility]  . COPD (chronic obstructive pulmonary disease)   . BPH (benign prostatic hyperplasia)   . Pacemaker     [facility]  . SSS (sick sinus syndrome)     s/p PPM   . CAD (coronary artery disease)     ?MI in 2007  . Pneumothorax     traumatic 2007  . Anxiety and depression   . Psychosis   . Bipolar disorder    Past Surgical History  Procedure Laterality Date  . Cardiac surgery      ascending thoracic aneurysm repair 06/2014  . Pacemaker placement  2007    battery replaced 07/04/2014  . Hiatal hernia repair    . Knee surgery      L5-6  . Lumbar fusion    . Aortic valve replacement      porcine valve   Social History:  reports that he has quit smoking. His smoking use included Cigarettes. He smoked 1.00 pack per day. He does not have any smokeless tobacco history on file. He reports that he drinks alcohol. He reports that he uses illicit drugs (Marijuana). Where does patient live not sure. Can patient participate in ADLs? Not sure.  Allergies  Allergen Reactions  . Tiotropium Bromide Monohydrate Nausea Only and Anaphylaxis    Tremors    Family History:  Family History  Problem Relation Age of Onset  . CAD Father   . Diabetes Father   . High blood pressure Brother   . Cancer Brother       Prior to Admission medications   Medication Sig Start Date End Date Taking? Authorizing Provider  albuterol (PROVENTIL HFA;VENTOLIN HFA) 108 (90 BASE) MCG/ACT inhaler Inhale 2 puffs into the lungs every 4 (four) hours as needed. Patient  taking differently: Inhale 2 puffs into the lungs every 4 (four) hours as needed for wheezing or shortness of breath.  09/18/14  Yes Ward Givens, MD  aspirin 81 MG tablet Take 1 tablet (81 mg total) by mouth daily. 09/12/14  Yes Velna Hatchet May Agustin, NP  furosemide (LASIX) 40 MG tablet Take 1 tablet (40 mg total) by mouth daily. 09/12/14  Yes Velna Hatchet May Agustin, NP  ipratropium-albuterol (DUONEB) 0.5-2.5 (3) MG/3ML SOLN Take 3 mLs by nebulization every 4 (four) hours as needed. Shortness of breath/wheezing 09/21/14  Yes Olivia Mackie, MD  mometasone-formoterol [facility]) 200-5 MCG/ACT AERO Inhale 2 puffs into the lungs 2 (two) times daily. 09/12/14  Yes Velna Hatchet May Agustin, NP  predniSONE (DELTASONE) 20 MG tablet Take 3 tablets (60 mg total) by mouth daily. 09/21/14  Yes Olivia Mackie, MD  gabapentin (NEURONTIN) 100 MG capsule Take 2 capsules (200 mg total) by mouth 3 (three) times daily. Patient not taking: Reported on 09/18/2014 09/12/14   [facility], NP  traZODone (DESYREL) 50 MG tablet Take 1 tablet (50 mg total) by mouth at bedtime as needed for sleep. Patient not taking: Reported on 09/18/2014 09/12/14   [facility], NP    Physical Exam: Ceasar Mons Vitals:   09/24/14 0327 09/24/14 0330 09/24/14 0339 09/24/14 0439  BP:   115/77 113/76  Pulse:  100 88 102  Temp:   97.4 F (36.3 C) 97.3 F (36.3 C)  TempSrc:   Axillary Axillary  Resp:   20 24  Height:    5\' 11"  (1.803 m)  Weight:    70.489 kg (155 lb 6.4 oz)  SpO2: 100% 100% 97% 100%     General:  Moderately built and nourished.  Eyes: Anicteric no pallor.  ENT: No discharge from the ears eyes nose more.  Neck: No JVD appreciated.  Cardiovascular: S1-S2 heard.  Respiratory: Mild expiratory wheezes and crepitations.  Abdomen: Soft nontender bowel sounds present.  Skin: No rash.  Musculoskeletal: No edema.  Psychiatric: Patient is not very communicative and avoids direct eye contacts.  Neurologic: Alert awake  oriented to his name and follows commands and moves all extremities.  Labs on Admission:  Basic Metabolic Panel:  Recent Labs Lab 09/22/14 0041 09/24/14 0019  NA 139 147  K 3.7 4.5  CL 97 104  CO2 30 33*  GLUCOSE 109* 119*  BUN 18 17  CREATININE 0.87 1.06  CALCIUM 9.5 9.4   Liver Function Tests: No results for input(s): AST, ALT, ALKPHOS, BILITOT, PROT, ALBUMIN in the last 168 hours. No results for input(s): LIPASE, AMYLASE in the last 168 hours. No results for input(s): AMMONIA in the last 168 hours. CBC:  Recent Labs Lab 09/22/14 0041 09/24/14 0019  WBC 17.5* 13.7*  NEUTROABS 13.7* 9.3*  HGB 12.4* 12.1*  HCT 38.6* 39.2  MCV 91.5 94.7  PLT 373 395   Cardiac Enzymes:  Recent Labs Lab 09/24/14 0019  TROPONINI <0.30    BNP (last 3 results)  Recent Labs  09/01/14 1722 09/04/14 2121  PROBNP 653.8* 333.0*   CBG:  Recent Labs Lab 09/20/14 2253 09/22/14 2042  GLUCAP 116* 123*    Radiological Exams on Admission: Dg Chest Port 1 View  09/24/2014   CLINICAL DATA:  Labored breathing, acute onset.  Initial encounter.  EXAM: PORTABLE CHEST - 1 VIEW  COMPARISON:  Chest radiograph performed 09/15/2014  FINDINGS: The lungs are well-aerated. Mild vascular congestion is noted. Mild left basilar opacity may reflect atelectasis or possibly mild pneumonia. There is no evidence of pleural effusion or pneumothorax.  The cardiomediastinal silhouette is normal in size. The patient is status post median sternotomy. An aortic valve replacement is noted. A pacemaker is noted overlying the left chest wall, with leads ending overlying the right atrium and right ventricle. No acute osseous abnormalities are seen.  IMPRESSION: Mild vascular congestion noted. Mild left basilar airspace opacity may reflect atelectasis or possibly mild pneumonia.   Electronically Signed   By: Roanna RaiderJeffery  Chang M.D.   On: 09/24/2014 01:13    EKG: Independently reviewed. Paced  rhythm.  Assessment/Plan Principal Problem:   COPD exacerbation Active Problems:   Bipolar I disorder, single manic episode, severe, with psychosis   H/O aortic valve replacement   History of permanent cardiac pacemaker placement   SOB (shortness of breath)   1. Shortness of breath could be a combination of COPD exacerbation and possible mild CHF - [facility] showed normal LV function. Patient is on Lasix 40 mg by mouth daily and I have placed patient on 40 mg IV daily for now and closely follow respiratory status and intake output. Check BNP. Patient also has been placed on nebulizer and Pulmicort. Patient did receive 1 dose of IV steroids in the ER and further doses to be decided based on patient's history status. Chest x-ray showing possible pneumonia and patient was [facility] I placed patient on antibiotics to cover for Healthcare associated pneumonia. 2. Possible healthcare associated pneumonia - see #1. 3. Bipolar disorder - continue his present medication and consult psychiatry in a.m. Patient will be placed with sitter and suicide precautions. Patient does not endorse any suicidal ideation at this time. 4. History of sick sinus syndrome status post pacemaker placement. 5. History of bioprosthetic aortic valve replacement. 6. History of ascending headache aneurysm status post repair. 7. History of tobacco abuse - will need counseling. 8. As per the chart patient has a history of alcohol abuse the patient denies having had any alcohol recently and patient was in the hospital for last 3 days. For now I have placed patient on when necessary Ativan and thiamine.    Code Status: Full code.  Family Communication: Try to reach patient's son but was unable to.  Disposition Plan: Admit to inpatient.    Lazette Estala N. [facility] Pager [phone].  If 7PM-7AM, please contact  night-coverage [email or web address] Password [email or web address] 09/24/2014, 4:51 AM

## 2014-09-24 NOTE — Consult Note (Signed)
[facility] Face-to-Face Psychiatry Consult   Reason for Consult:  Medication management for bipolar disorder and disposition plans Referring Physician: Samuella Cota, MD [name] [name] is an 68 y.o. male. Total Time spent with patient: 45 minutes  Assessment: AXIS I:  Bipolar disorder, most recent episode is mania AXIS II:  Deferred AXIS III:   Past Medical History  Diagnosis Date  . 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 COPD (chronic obstructive pulmonary disease)   . Thoracic ascending aortic aneurysm     repaired 06/2014 at [facility]  . COPD (chronic obstructive pulmonary disease)   . BPH (benign prostatic hyperplasia)   . Pacemaker     battery replaced 06/2014 at [facility]  . SSS (sick sinus syndrome)     s/p PPM   . CAD (coronary artery disease)     ?MI in 2007  . Pneumothorax     traumatic 2007  . Anxiety and depression   . Psychosis   . Bipolar disorder    AXIS IV:  economic problems, other psychosocial or environmental problems and problems related to social environment AXIS V:  21-30 behavior considerably influenced by delusions or hallucinations OR serious impairment in judgment, communication OR inability to function in almost all areas  Plan:  Spoke with Samuella Cota, MD Restart Depakote ER 500 mg two tablet at bed time for mood swings Continue Ativan PRN for anxiety Refer to psych social service regarding psychiatric inpatient bed No evidence of imminent risk to self or others at present.   Supportive therapy provided about ongoing stressors.  Appreciate psychiatric consultation and follow up tomorrow Please contact [phone] or [phone] if needs further assistance   Subjective:   [name] is a 68 y.o. male patient admitted with grandiose delusions and bizarre behavior.  HPI: [name] [name] is an 68 y.o. male, Caucasian seen and chart reviewed for psychiatric consultation and evaluation of bipolar disorder  and medication management. He was admitted to medical floor for medical stabilization of sob due to COPD and Pneumonia. Patient has been suffering with increased irritability, pressured speech, racing thoughts, grandiose delusions and inappropriate and bizarre behaviors. His antipsychotic medication seroquel was discontinued during his recent admission at [facility] due to cardiac structural abnormality and repair of valve in the recent past. He was treated with depakote which he was non compliant while in out patient care. Patient denied suicidal or homicidal ideations, intention or plans. He was recently admitted to [facility] (from 09/07/14-09/12/14) due to aggressiveness and delusional thoughts but has not been taking his prescribed medications or keep his follow up appointment. Patient has no insight into his problem and has been exercising poor judgment.   Medical history:  Patient with history of Bipolar disorder who presents to [facility] ED by Event organiser. He was petitioned for IVC by Charolette Forward with DSS [phone]. Per reports, patient has been extremely manic, has not been sleeping for days, having racing thoughts, mood lability, aggressive behavior, hostility and disorganized thought process.. He has been to emergency departments numerous times last week but always left against medical advice. Patient reportedly  had heart surgery recently and has COPD. He has  Not been using breathing equipment. 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 He also reports that he is working for [employer] and buying illegal drugs.   HPI Elements:   Location:  delusional, mood  lability. Quality:  severe. Duration:  one week. Context:  non-compliant .  Past Psychiatric History: Past Medical History  Diagnosis Date  . Severe aortic valve stenosis     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 at [facility]  . COPD (chronic obstructive pulmonary disease)   .  Thoracic ascending aortic aneurysm     repaired 06/2014 at [facility]  . COPD (chronic obstructive pulmonary disease)   . BPH (benign prostatic hyperplasia)   . Pacemaker     battery replaced 06/2014 at [facility]  . SSS (sick sinus syndrome)     s/p PPM   . CAD (coronary artery disease)     ?MI in 2007  . Pneumothorax     traumatic 2007  . Anxiety and depression   . Psychosis   . Bipolar disorder     reports that he has quit smoking. His smoking use included Cigarettes. He smoked 1.00 pack per day. He does not have any smokeless tobacco history on file. He reports that he drinks alcohol. He reports that he uses illicit drugs (Marijuana). Family History  Problem Relation Age of Onset  . CAD Father   . Diabetes Father   . High blood pressure Brother   . Cancer Brother    Family History Substance Abuse:  (Unable to assess/Pt refused to participate) Family Supports: Yes, List: (Two sons. [name] is his POA.)) Living Arrangements: Other (Comment) (unknown) Can pt return to current living arrangement?: No Abuse/Neglect [facility]) Physical Abuse: Denies Verbal Abuse: Denies Sexual Abuse: Denies Allergies:   Allergies  Allergen Reactions  . Tiotropium Bromide Monohydrate Nausea Only and Anaphylaxis    Tremors    ACT Assessment Complete:  Yes:    Educational Status    Risk to Self: Risk to self with the past 6 months Suicidal Ideation: No (Unable to assess/Pt refused to participate) Suicidal Intent: No (Unable to assess/Pt refused to participate) Is patient at risk for suicide?: No (Unable to assess/Pt refused to participate) Suicidal Plan?: No (Unable to assess/Pt refused to participate) Access to Means: No (Unable to assess/Pt refused to participate) What has been your use of drugs/alcohol within the last 12 months?: Pt uses marijuana regularly Previous Attempts/Gestures: No (Unable to assess/Pt refused to participate) How many times?: 0 Other Self Harm Risks: None identified  (Unable to assess/Pt refused to participate) Triggers for Past Attempts: None known Intentional Self Injurious Behavior: None (Unable to assess/Pt refused to participate) Family Suicide History: Unable to assess Recent stressful life event(s): Other (Comment) (Unable to assess/Pt refused to participate) Persecutory voices/beliefs?: No (Unable to assess/Pt refused to participate) Depression: No (Unable to assess/Pt refused to participate) Depression Symptoms: Feeling angry/irritable Substance abuse history and/or treatment for substance abuse?: Yes Suicide prevention information given to non-admitted patients: Not applicable  Risk to Others: Risk to Others within the past 6 months Homicidal Ideation: No (Unable to assess/Pt refused to participate) Thoughts of Harm to Others: No (Unable to assess/Pt refused to participate) Current Homicidal Intent: No (Unable to assess/Pt refused to participate) Current Homicidal Plan: No (Unable to assess/Pt refused to participate) Access to Homicidal Means: No (Unable to assess/Pt refused to participate) Identified Victim: Unable to assess/Pt refused to participate History of harm to others?: No (Unable to assess/Pt refused to participate) Assessment of Violence: None Noted (Unable to assess/Pt refused to participate) Violent Behavior Description: Unable to assess/Pt refused to participate Does patient have access to weapons?: No (Unable to assess/Pt refused to participate) Criminal Charges Pending?: No (Unable to assess/Pt refused to  participate) Does patient have a court date: No (Unable to assess/Pt refused to participate)  Abuse: Abuse/Neglect Assessment (Assessment to be complete while patient is alone) Physical Abuse: Denies Verbal Abuse: Denies Sexual Abuse: Denies Exploitation of patient/patient's resources: Denies Self-Neglect: Denies  Prior Inpatient Therapy: Prior Inpatient Therapy Prior Inpatient Therapy: Yes Prior Therapy Dates: In the  last month Prior Therapy Facilty/Provider(s): [facility] in [place] Reason for Treatment: inpt mental health- psychosis  Prior Outpatient Therapy: Prior Outpatient Therapy Prior Outpatient Therapy: No Prior Therapy Dates: Unknown Prior Therapy Facilty/Provider(s): Unknown Reason for Treatment: Unknown  Additional Information: Additional Information 1:1 In Past 12 Months?: No CIRT Risk: No Elopement Risk: No Does patient have medical clearance?: Yes          Objective: Blood pressure 134/80, pulse 113, temperature 98 F (36.7 C), temperature source Oral, resp. rate 24, height $RemoveBe'5\' 11"'zurdoqtFC$  (1.803 m), weight 70.489 kg (155 lb 6.4 oz), SpO2 97 %.Body mass index is 21.68 kg/(m^2). Results for orders placed or performed during the hospital encounter of 09/21/14 (from the past 72 hour(s))  Drug screen panel, emergency     Status: None   Collection Time: 09/22/14 12:10 AM  Result Value Ref Range   Opiates NONE DETECTED NONE DETECTED   Cocaine NONE DETECTED NONE DETECTED   Benzodiazepines NONE DETECTED NONE DETECTED   Amphetamines NONE DETECTED NONE DETECTED   Tetrahydrocannabinol NONE DETECTED NONE DETECTED   Barbiturates NONE DETECTED NONE DETECTED    Comment:        DRUG SCREEN FOR MEDICAL PURPOSES ONLY.  IF CONFIRMATION IS NEEDED FOR ANY PURPOSE, NOTIFY LAB WITHIN 5 DAYS.        LOWEST DETECTABLE LIMITS FOR URINE DRUG SCREEN Drug Class       Cutoff (ng/mL) Amphetamine      1000 Barbiturate      200 Benzodiazepine   122 Tricyclics       482 Opiates          300 Cocaine          300 THC              50   CBC with Differential     Status: Abnormal   Collection Time: 09/22/14 12:41 AM  Result Value Ref Range   WBC 17.5 (H) 4.0 - 10.5 K/uL   RBC 4.22 4.22 - 5.81 MIL/uL   Hemoglobin 12.4 (L) 13.0 - 17.0 g/dL   HCT 38.6 (L) 39.0 - 52.0 %   MCV 91.5 78.0 - 100.0 fL   MCH 29.4 26.0 - 34.0 pg   MCHC 32.1 30.0 - 36.0 g/dL   RDW 15.6 (H) 11.5 - 15.5 %   Platelets 373 150  - 400 K/uL   Neutrophils Relative % 79 (H) 43 - 77 %   Neutro Abs 13.7 (H) 1.7 - 7.7 K/uL   Lymphocytes Relative 12 12 - 46 %   Lymphs Abs 2.2 0.7 - 4.0 K/uL   Monocytes Relative 9 3 - 12 %   Monocytes Absolute 1.5 (H) 0.1 - 1.0 K/uL   Eosinophils Relative 0 0 - 5 %   Eosinophils Absolute 0.0 0.0 - 0.7 K/uL   Basophils Relative 0 0 - 1 %   Basophils Absolute 0.0 0.0 - 0.1 K/uL  Basic metabolic panel     Status: Abnormal   Collection Time: 09/22/14 12:41 AM  Result Value Ref Range   Sodium 139 137 - 147 mEq/L   Potassium 3.7 3.7 - 5.3 mEq/L  Chloride 97 96 - 112 mEq/L   CO2 30 19 - 32 mEq/L   Glucose, Bld 109 (H) 70 - 99 mg/dL   BUN 18 6 - 23 mg/dL   Creatinine, Ser 0.50 0.50 - 1.35 mg/dL   Calcium 9.5 8.4 - 56.7 mg/dL   GFR calc non Af Amer 87 (L) >90 mL/min   GFR calc Af Amer >90 >90 mL/min    Comment: (NOTE) The eGFR has been calculated using the CKD EPI equation. This calculation has not been validated in all clinical situations. eGFR's persistently <90 mL/min signify possible Chronic Kidney Disease.    Anion gap 12 5 - 15  Ethanol     Status: None   Collection Time: 09/22/14 12:41 AM  Result Value Ref Range   Alcohol, Ethyl (B) <11 0 - 11 mg/dL    Comment:        LOWEST DETECTABLE LIMIT FOR SERUM ALCOHOL IS 11 mg/dL FOR MEDICAL PURPOSES ONLY   Salicylate level     Status: Abnormal   Collection Time: 09/22/14 12:41 AM  Result Value Ref Range   Salicylate Lvl <2.0 (L) 2.8 - 20.0 mg/dL  Acetaminophen level     Status: None   Collection Time: 09/22/14 12:41 AM  Result Value Ref Range   Acetaminophen (Tylenol), Serum <15.0 10 - 30 ug/mL    Comment:        THERAPEUTIC CONCENTRATIONS VARY SIGNIFICANTLY. A RANGE OF 10-30 ug/mL MAY BE AN EFFECTIVE CONCENTRATION FOR MANY PATIENTS. HOWEVER, SOME ARE BEST TREATED AT CONCENTRATIONS OUTSIDE THIS RANGE. ACETAMINOPHEN CONCENTRATIONS >150 ug/mL AT 4 HOURS AFTER INGESTION AND >50 ug/mL AT 12 HOURS AFTER INGESTION  ARE OFTEN ASSOCIATED WITH TOXIC REACTIONS.   CBG monitoring, ED     Status: Abnormal   Collection Time: 09/22/14  8:42 PM  Result Value Ref Range   Glucose-Capillary 123 (H) 70 - 99 mg/dL  Basic metabolic panel     Status: Abnormal   Collection Time: 09/24/14 12:19 AM  Result Value Ref Range   Sodium 147 137 - 147 mEq/L   Potassium 4.5 3.7 - 5.3 mEq/L   Chloride 104 96 - 112 mEq/L   CO2 33 (H) 19 - 32 mEq/L   Glucose, Bld 119 (H) 70 - 99 mg/dL   BUN 17 6 - 23 mg/dL   Creatinine, Ser 8.89 0.50 - 1.35 mg/dL   Calcium 9.4 8.4 - 33.8 mg/dL   GFR calc non Af Amer 70 (L) >90 mL/min   GFR calc Af Amer 81 (L) >90 mL/min    Comment: (NOTE) The eGFR has been calculated using the CKD EPI equation. This calculation has not been validated in all clinical situations. eGFR's persistently <90 mL/min signify possible Chronic Kidney Disease.    Anion gap 10 5 - 15  CBC with Differential     Status: Abnormal   Collection Time: 09/24/14 12:19 AM  Result Value Ref Range   WBC 13.7 (H) 4.0 - 10.5 K/uL   RBC 4.14 (L) 4.22 - 5.81 MIL/uL   Hemoglobin 12.1 (L) 13.0 - 17.0 g/dL   HCT 82.6 66.6 - 48.6 %   MCV 94.7 78.0 - 100.0 fL   MCH 29.2 26.0 - 34.0 pg   MCHC 30.9 30.0 - 36.0 g/dL   RDW 16.1 (H) 22.4 - 00.1 %   Platelets 395 150 - 400 K/uL   Neutrophils Relative % 68 43 - 77 %   Neutro Abs 9.3 (H) 1.7 - 7.7 K/uL   Lymphocytes Relative  22 12 - 46 %   Lymphs Abs 2.9 0.7 - 4.0 K/uL   Monocytes Relative 10 3 - 12 %   Monocytes Absolute 1.4 (H) 0.1 - 1.0 K/uL   Eosinophils Relative 0 0 - 5 %   Eosinophils Absolute 0.0 0.0 - 0.7 K/uL   Basophils Relative 0 0 - 1 %   Basophils Absolute 0.0 0.0 - 0.1 K/uL  Troponin I     Status: None   Collection Time: 09/24/14 12:19 AM  Result Value Ref Range   Troponin I <0.30 <0.30 ng/mL    Comment:        Due to the release kinetics of cTnI, a negative result within the first hours of the onset of symptoms does not rule out myocardial infarction with  certainty. If myocardial infarction is still suspected, repeat the test at appropriate intervals.   Pro b natriuretic peptide     Status: Abnormal   Collection Time: 09/24/14  7:04 AM  Result Value Ref Range   Pro B Natriuretic peptide (BNP) 798.5 (H) 0 - 125 pg/mL  Troponin I     Status: None   Collection Time: 09/24/14  7:04 AM  Result Value Ref Range   Troponin I <0.30 <0.30 ng/mL    Comment:        Due to the release kinetics of cTnI, a negative result within the first hours of the onset of symptoms does not rule out myocardial infarction with certainty. If myocardial infarction is still suspected, repeat the test at appropriate intervals.   Comprehensive metabolic panel     Status: Abnormal   Collection Time: 09/24/14  7:04 AM  Result Value Ref Range   Sodium 145 137 - 147 mEq/L   Potassium 4.6 3.7 - 5.3 mEq/L   Chloride 103 96 - 112 mEq/L   CO2 31 19 - 32 mEq/L   Glucose, Bld 210 (H) 70 - 99 mg/dL   BUN 16 6 - 23 mg/dL   Creatinine, Ser 0.80 0.50 - 1.35 mg/dL   Calcium 9.3 8.4 - 10.5 mg/dL   Total Protein 7.3 6.0 - 8.3 g/dL   Albumin 3.0 (L) 3.5 - 5.2 g/dL   AST 29 0 - 37 U/L   ALT 40 0 - 53 U/L   Alkaline Phosphatase 95 39 - 117 U/L   Total Bilirubin <0.2 (L) 0.3 - 1.2 mg/dL   GFR calc non Af Amer 90 (L) >90 mL/min   GFR calc Af Amer >90 >90 mL/min    Comment: (NOTE) The eGFR has been calculated using the CKD EPI equation. This calculation has not been validated in all clinical situations. eGFR's persistently <90 mL/min signify possible Chronic Kidney Disease.    Anion gap 11 5 - 15  CBC with Differential     Status: Abnormal   Collection Time: 09/24/14  7:04 AM  Result Value Ref Range   WBC 13.3 (H) 4.0 - 10.5 K/uL   RBC 4.30 4.22 - 5.81 MIL/uL   Hemoglobin 12.5 (L) 13.0 - 17.0 g/dL   HCT 40.8 39.0 - 52.0 %   MCV 94.9 78.0 - 100.0 fL   MCH 29.1 26.0 - 34.0 pg   MCHC 30.6 30.0 - 36.0 g/dL   RDW 15.9 (H) 11.5 - 15.5 %   Platelets 406 (H) 150 - 400 K/uL    Neutrophils Relative % 91 (H) 43 - 77 %   Neutro Abs 12.1 (H) 1.7 - 7.7 K/uL   Lymphocytes Relative 7 (L) 12 -  46 %   Lymphs Abs 0.9 0.7 - 4.0 K/uL   Monocytes Relative 2 (L) 3 - 12 %   Monocytes Absolute 0.3 0.1 - 1.0 K/uL   Eosinophils Relative 0 0 - 5 %   Eosinophils Absolute 0.0 0.0 - 0.7 K/uL   Basophils Relative 0 0 - 1 %   Basophils Absolute 0.0 0.0 - 0.1 K/uL  TSH     Status: Abnormal   Collection Time: 09/24/14  7:04 AM  Result Value Ref Range   TSH 0.053 (L) 0.350 - 4.500 uIU/mL    Comment: Performed at [facility] are reviewed and are pertinent for the above.  Current Facility-Administered Medications  Medication Dose Route Frequency Provider Last Rate Last Dose  . acetaminophen (TYLENOL) tablet 650 mg  650 mg Oral Q6H PRN Rise Patience, MD       Or  . acetaminophen (TYLENOL) suppository 650 mg  650 mg Rectal Q6H PRN Rise Patience, MD      . albuterol (PROVENTIL) (2.5 MG/3ML) 0.083% nebulizer solution 2.5 mg  2.5 mg Nebulization Q4H Rise Patience, MD   2.5 mg at 09/24/14 1231  . albuterol (PROVENTIL) (2.5 MG/3ML) 0.083% nebulizer solution 2.5 mg  2.5 mg Nebulization Q2H PRN Rise Patience, MD      . antiseptic oral rinse (CPC / CETYLPYRIDINIUM CHLORIDE 0.05%) solution 7 mL  7 mL Mouth Rinse BID Samuella Cota, MD   7 mL at 09/24/14 1000  . aspirin chewable tablet 81 mg  81 mg Oral Daily Mariea Clonts, MD   81 mg at 09/23/14 1028  . atorvastatin (LIPITOR) tablet 40 mg  40 mg Oral q1800 Mojeed Akintayo   40 mg at 09/23/14 1836  . budesonide (PULMICORT) nebulizer solution 0.25 mg  0.25 mg Nebulization BID Rise Patience, MD   0.25 mg at 09/24/14 0743  . ceFEPIme (MAXIPIME) 2 g in dextrose 5 % 50 mL IVPB  2 g Intravenous Q12H Rise Patience, MD   2 g at 09/24/14 0548  . divalproex (DEPAKOTE ER) 24 hr tablet 1,000 mg  1,000 mg Oral QHS Durward Parcel, MD      . enoxaparin (LOVENOX) injection 40 mg  40 mg  Subcutaneous Q24H Rise Patience, MD   40 mg at 09/24/14 0919  . folic acid (FOLVITE) tablet 1 mg  1 mg Oral Daily Rise Patience, MD   1 mg at 09/24/14 1000  . [START ON 09/25/2014] furosemide (LASIX) tablet 20 mg  20 mg Oral Daily Samuella Cota, MD      . gabapentin (NEURONTIN) capsule 100 mg  100 mg Oral TID Mojeed Akintayo   100 mg at 09/23/14 2127  . [START ON 09/25/2014] Influenza vac split quadrivalent PF (FLUARIX) injection 0.5 mL  0.5 mL Intramuscular Tomorrow-1000 Rise Patience, MD      . LORazepam (ATIVAN) injection 1 mg  1 mg Intravenous Q4H PRN Samuella Cota, MD   1 mg at 09/24/14 1114  . multivitamin with minerals tablet 1 tablet  1 tablet Oral Daily Rise Patience, MD   1 tablet at 09/24/14 1000  . ondansetron (ZOFRAN) tablet 4 mg  4 mg Oral Q6H PRN Rise Patience, MD       Or  . ondansetron [facility]) injection 4 mg  4 mg Intravenous Q6H PRN Rise Patience, MD      . Derrill Memo ON 09/25/2014] pneumococcal 23 valent vaccine (PNU-IMMUNE) injection  0.5 mL  0.5 mL Intramuscular Tomorrow-1000 Rise Patience, MD      . sodium chloride 0.9 % injection 3 mL  3 mL Intravenous Q12H Rise Patience, MD   3 mL at 09/24/14 1000  . sodium chloride 0.9 % injection 3 mL  3 mL Intravenous Q12H Rise Patience, MD   3 mL at 09/24/14 1000  . tamsulosin (FLOMAX) capsule 0.4 mg  0.4 mg Oral QPC supper Mojeed Akintayo   0.4 mg at 09/23/14 1836  . thiamine (VITAMIN B-1) tablet 100 mg  100 mg Oral Daily Rise Patience, MD   100 mg at 09/24/14 1000   Or  . thiamine (B-1) injection 100 mg  100 mg Intravenous Daily Rise Patience, MD        Psychiatric Specialty Exam:     Blood pressure 134/80, pulse 113, temperature 98 F (36.7 C), temperature source Oral, resp. rate 24, height $RemoveBe'5\' 11"'PQhxGpqyd$  (1.803 m), weight 70.489 kg (155 lb 6.4 oz), SpO2 97 %.Body mass index is 21.68 kg/(m^2).  General Appearance: Disheveled  Eye Contact::  Minimal  Speech:  Garbled   Volume:  Increased  Mood:  Angry and Irritable  Affect:  Labile  Thought Process:  Circumstantial and Disorganized  Orientation:  Full (Time, Place, and Person)  Thought Content:  Delusions and grandiose  Suicidal Thoughts:  No  Homicidal Thoughts:  No  Memory:  Immediate;   Fair Recent;   Fair Remote;   Fair  Judgement:  Impaired  Insight:  Lacking  Psychomotor Activity:  Increased  Concentration:  Poor  Recall:  [place]: Good  Akathisia:  No  Handed:  Right  AIMS (if indicated):     Assets:  Communication Skills  Sleep:   poor   Musculoskeletal: Strength & Muscle Tone: within normal limits Gait & Station: normal Patient leans: N/A  Treatment Plan Summary: Daily contact with patient to assess and evaluate symptoms and progress in treatment Medication management  Start Depakote ER 500 mg ii PO Qhs for bipolar mania and will monitor for valproic acid level  Armoni Depass,JANARDHAHA R., MD 09/24/2014 2:01 PM

## 2014-09-24 NOTE — Progress Notes (Signed)
ANTIBIOTIC CONSULT NOTE - INITIAL  Pharmacy Consult for Vancomycin and Cefepime Indication: HCAP  Allergies  Allergen Reactions  . Tiotropium Bromide Monohydrate Nausea Only and Anaphylaxis    Tremors    Patient Measurements: Height: 5\' 11"  (180.3 cm) Weight: 155 lb 6.4 oz (70.489 kg) IBW/kg (Calculated) : 75.3  Vital Signs: Temp: 97.3 F (36.3 C) (11/28 0439) Temp Source: Axillary (11/28 0439) BP: 113/76 mmHg (11/28 0439) Pulse Rate: 102 (11/28 0439) Intake/Output from previous day:   Intake/Output from this shift:    Labs:  Recent Labs  09/22/14 0041 09/24/14 0019  WBC 17.5* 13.7*  HGB 12.4* 12.1*  PLT 373 395  CREATININE 0.87 1.06   Estimated Creatinine Clearance: 66.5 mL/min (by C-G formula based on Cr of 1.06). No results for input(s): VANCOTROUGH, VANCOPEAK, VANCORANDOM, GENTTROUGH, GENTPEAK, GENTRANDOM, TOBRATROUGH, TOBRAPEAK, TOBRARND, AMIKACINPEAK, AMIKACINTROU, AMIKACIN in the last 72 hours.   Microbiology: No results found for this or any previous visit (from the past 720 hour(s)).  Medical History: Past Medical History  Diagnosis Date  . Severe aortic valve stenosis     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  . COPD (chronic obstructive pulmonary disease)   . Thoracic ascending aortic aneurysm     [facility]  . COPD (chronic obstructive pulmonary disease)   . BPH (benign prostatic hyperplasia)   . Pacemaker     [facility]  . SSS (sick sinus syndrome)     s/p PPM   . CAD (coronary artery disease)     ?MI in 2007  . Pneumothorax     traumatic 2007  . Anxiety and depression   . Psychosis   . Bipolar disorder     Medications:  Anti-infectives    Start     Dose/Rate Route Frequency Ordered Stop   09/24/14 0800  vancomycin (VANCOCIN) IVPB 1000 mg/200 mL premix     1,000 mg200 mL/hr over 60 Minutes Intravenous Every 12 hours 09/24/14 0522     09/24/14 0600  ceFEPIme (MAXIPIME) 2 g in dextrose 5 %  50 mL IVPB     2 g100 mL/hr over 30 Minutes Intravenous Every 12 hours 09/24/14 0522       Assessment: 68yo M w/ COPD, bipolar disorder, hx of substance abuse, [facility] with [facility]. On 11[facility]27 [facility]. Pharmacy is asked to dose Vanc and Cefepime for HCAP.  11/28 >> Cefepime >> 11/28 >> Vanc >>    Tmax: AF WBCs: elevated Renal: SCr 1.06, CrCl 5066ml/min  Goal of Therapy:  Vancomycin trough level 15-20 mcg/ml  Appropriate antibiotic dosing for renal function; eradication of infection  Plan:  Vancomycin 1g IV q12h. Cefepime 2g IV q12h. Measure Vanc trough at steady state. Follow up renal fxn and culture results.  Charolotte Ekeom Taylan Marez, PharmD, pager [phone]. 09/24/2014,5:23 AM.

## 2014-09-24 NOTE — Progress Notes (Signed)
Pt very uncooperative, Pt refused to cough, in order to complete the post CAT assessment.

## 2014-09-25 LAB — T4, FREE: Free T4: 1.32 ng/dL (ref 0.80–1.80)

## 2014-09-25 LAB — T3: T3, Total: 69.2 ng/dl — ABNORMAL LOW (ref 80.0–204.0)

## 2014-09-25 MED ORDER — LORAZEPAM 2 MG/ML IJ SOLN
1.0000 mg | Freq: Four times a day (QID) | INTRAMUSCULAR | Status: DC | PRN
  Administered 2014-09-26 – 2014-09-28 (×3): 1 mg via INTRAMUSCULAR
  Filled 2014-09-25 (×4): qty 1

## 2014-09-25 MED ORDER — POLYETHYLENE GLYCOL 3350 17 G PO PACK
17.0000 g | PACK | Freq: Two times a day (BID) | ORAL | Status: DC
  Administered 2014-09-25 – 2014-10-04 (×15): 17 g via ORAL
  Filled 2014-09-25 (×20): qty 1

## 2014-09-25 MED ORDER — CEFUROXIME AXETIL 500 MG PO TABS
500.0000 mg | ORAL_TABLET | Freq: Two times a day (BID) | ORAL | Status: DC
  Administered 2014-09-25 – 2014-10-01 (×12): 500 mg via ORAL
  Filled 2014-09-25 (×15): qty 1

## 2014-09-25 MED ORDER — HALOPERIDOL LACTATE 5 MG/ML IJ SOLN
2.0000 mg | Freq: Once | INTRAMUSCULAR | Status: AC
  Administered 2014-09-25: 2 mg via INTRAVENOUS
  Filled 2014-09-25: qty 1

## 2014-09-25 NOTE — Progress Notes (Signed)
PROGRESS NOTE  [name] [name] ZOX:[id number] DOB: 06/29/1946 DOA: 09/21/2014 PCP: No PCP Per Patient  Summary: 68 year old man with recurrent severe manic and psychotic features presented to the ER for the seventh time in the last month 11/25. He presented with IVC papers. He was noted to be delusional at hotel, displayed belligerent behavior in ED, requiring IM injection, police restraint and shackling to the bed. [facility] 11/27 Psychiatrist recommended inpatient psych treatment. He was extremely uncooperative and transferred to the emergency department for shortness of breath. There he was found to have oxygen saturation 89% on room air with diffuse wheezes. According to admission documentation he appeared mildly short of breath. He was admitted for COPD exacerbation, possible mild CHF, possible pneumonia.  Assessment/Plan: 1. Bipolar disorder with recurrent severe manic, psychotic features. Much improved today after starting depakote per psychiatry. continue  Ativan as needed. Trazodone at night. 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 No focal deficits noted. CT head negative 06/2014 at [facility] (no MRI/pacemaker). 2. Mild COPD exacerbation appears resolved. Avoid steroids at this point given clinical improvement. 3. Mild acute diastolic heart failure. Resolved at this point. Continue low-dose oral Lasix. 4. Possible pneumonia. Appears resolved at this point. Transition oral antibiotics. 5. Abnormal TSH. Significance unclear. TSH 0.357 November 12, 0.053 November 28. Free T4, T3 total within normal limits November 12. Repeat levels are pending. 6. Status [facility] not on anticoagulation. 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 Continue aspirin, statin. 7. Status  post pacemaker placement; battery replaced September 2015 [facility] because the battery had run out and the patient failed to follow-up.  8. Prolonged QTc. Avoid antipsychotics.   Overall appears much better today after starting Depakote. Compliance continues to be an issue. His thinking is more ordered.   Medically speaking anticipate clear for discharge to psychiatric facility 11/30. Discussed with Dr. Elsie SaasJonnalagadda.  Change to oral antibiotics.  Continue breathing treatments.   Continue oral Lasix.  Follow-up repeat T3, T4. Doubt clinically significant at this point.   Unable to reach son at number provided on chart Neena Rhymes(Justin Bowell, [phone])   Code Status: full code DVT prophylaxis: Lovenox Family Communication:  [facility]  Brendia Sacksaniel Goodrich, MD  [facility]  Pager [phone] If 7PM-7AM, please contact night-coverage at [email or web address], password [email or web address] 09/25/2014, 12:01 PM  LOS: 4 days   Consultants:  Psychiatry   Procedures:    Antibiotics:  Cefepime 11/28 >>11/29  HPI/Subjective: Uncooperative, threw tele-box at nurse. Has weaned off oxygen with SpO2 91% on RA.   He reports feeling better today. Overall seems to be doing better. Breathing okay.  Objective: Filed Vitals:   09/25/14 0535 09/25/14 0803 09/25/14 0900 09/25/14 1015  BP: 136/74     Pulse: 92     Temp: 97.9 F (36.6 C)     TempSrc: Axillary     Resp: 20     Height:      Weight:      SpO2: 99% 95% 89% 91%    Intake/Output Summary (Last 24 hours) at 09/25/14 1201 Last data filed at 09/25/14 0637  Gross per 24 hour  Intake    770 ml  Output   1150 ml  Net   -380 ml     Filed Weights   09/21/14 2326 09/24/14  [id number] 09/24/14 0540  Weight: 74.844 kg (165 lb) 70.489 kg (155 lb 6.4 oz) 70.489 kg (155 lb 6.4 oz)    Exam:     Afebrile, VSS, SpO2 91% on RA  Appears better today. More calm. Less restless. More alert and  conversant. Still with pressured speech but very understandable. Complies with examination.  Respiratory clear to auscultation bilaterally. No wheezes, rales or rhonchi. Fair air movement. Normal respiratory effort.  Cardiovascular regular rate and rhythm. No murmur, rub or gallop. No lower extremity edema.  Abdomen soft  Data Reviewed:  UOP 2575. -1.495 L since admission.  CBG stable  Scheduled Meds: . albuterol  2.5 mg Nebulization Q4H  . antiseptic oral rinse  7 mL Mouth Rinse BID  . aspirin  81 mg Oral Daily  . atorvastatin  40 mg Oral q1800  . budesonide (PULMICORT) nebulizer solution  0.25 mg Nebulization BID  . ceFEPime (MAXIPIME) IV  2 g Intravenous Q12H  . divalproex  1,000 mg Oral QHS  . enoxaparin (LOVENOX) injection  40 mg Subcutaneous Q24H  . folic acid  1 mg Oral Daily  . furosemide  20 mg Oral Daily  . gabapentin  100 mg Oral TID  . Influenza vac split quadrivalent PF  0.5 mL Intramuscular Tomorrow-1000  . multivitamin with minerals  1 tablet Oral Daily  . pneumococcal 23 valent vaccine  0.5 mL Intramuscular Tomorrow-1000  . sodium chloride  3 mL Intravenous Q12H  . sodium chloride  3 mL Intravenous Q12H  . tamsulosin  0.4 mg Oral QPC supper  . thiamine  100 mg Oral Daily   Or  . thiamine  100 mg Intravenous Daily   Continuous Infusions:   Principal Problem:   COPD exacerbation Active Problems:   Bipolar I disorder, single manic episode, severe, with psychosis   H/O aortic valve replacement   History of permanent cardiac pacemaker placement   SOB (shortness of breath)   HCAP (healthcare-associated pneumonia)   Acute diastolic CHF (congestive heart failure)   Time spent 20 minutes  Summary: ER 11/5-11/9: IVC placed for psychosis at [facility]. Psychiatry recommended inpatient treatment. Patient with combativeness required Ativan, Haldol, Geodon, police presence and shackling to bed. [facility]  [facility]16 hospitalized for bipolar disorder,  recurrent, severe, manic with psychotic features. Antipsychotics were avoided because of QT C elevation. Treated with Depakote ER 1000 mg QHS, Trazodone 50 mg QHS PRN sleep and Ativan 0.5 mg as needed to address agitation and mania.  ER 11/18 presented with chest pain. Patient left. 11/22 complaint of shortness of breath, cough, ran out of inhaler. Discharged with prescription for inhaler. 11/22 second visit same day for albuterol neb Rx. Left AMA, verbally abusive. 11/24 SOB. Left AMA. Refused interventions. 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 Treated for COPD exacerbation

## 2014-09-25 NOTE — Plan of Care (Signed)
Problem: Phase I Progression Outcomes Goal: OOB as tolerated unless otherwise ordered Outcome: Completed/Met Date Met:  09/25/14 Goal: Other Phase I Outcomes/Goals Outcome: Completed/Met Date Met:  09/25/14  Problem: Phase II Progression Outcomes Goal: Progress activity as tolerated unless otherwise ordered Outcome: Completed/Met Date Met:  09/25/14 Goal: Discharge plan established Outcome: Completed/Met Date Met:  09/25/14 Goal: Vital signs remain stable Outcome: Completed/Met Date Met:  09/25/14 Goal: IV changed to normal saline lock Outcome: Completed/Met Date Met:  09/25/14 Goal: Obtain order to discontinue catheter if appropriate Outcome: Not Applicable Date Met:  28/63/81 Goal: Other Phase II Outcomes/Goals Outcome: Completed/Met Date Met:  09/25/14  Problem: Phase III Progression Outcomes Goal: Pain controlled on oral analgesia Outcome: Completed/Met Date Met:  09/25/14 Goal: Activity at appropriate level-compared to baseline (UP IN CHAIR FOR HEMODIALYSIS)  Outcome: Completed/Met Date Met:  09/25/14 Goal: Voiding independently Outcome: Not Applicable Date Met:  77/11/65 Goal: IV/normal saline lock discontinued Outcome: Completed/Met Date Met:  09/25/14 Goal: Foley discontinued Outcome: Not Applicable Date Met:  79/03/83

## 2014-09-25 NOTE — Consult Note (Addendum)
Psychiatry Consult follow up note  Reason for Consult:  Medication management for bipolar disorder and disposition plans Referring Physician: Samuella Cota, MD  [name] [name] is an 68 y.o. male. Total Time spent with patient: 30 minutes  Assessment: AXIS I:  Bipolar disorder, most recent episode is mania AXIS II:  Deferred AXIS III:   Past Medical History  Diagnosis Date  . 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 COPD (chronic obstructive pulmonary disease)   . Thoracic ascending aortic aneurysm     repaired 06/2014 at [facility]  . COPD (chronic obstructive pulmonary disease)   . BPH (benign prostatic hyperplasia)   . Pacemaker     battery replaced 06/2014 at [facility]  . SSS (sick sinus syndrome)     s/p PPM   . CAD (coronary artery disease)     ?MI in 2007  . Pneumothorax     traumatic 2007  . Anxiety and depression   . Psychosis   . Bipolar disorder    AXIS IV:  economic problems, other psychosocial or environmental problems and problems related to social environment AXIS V:  21-30 behavior considerably influenced by delusions or hallucinations OR serious impairment in judgment, communication OR inability to function in almost all areas  Plan:  Case discussed with with Samuella Cota, MD Continue Depakote ER 500 mg two tablet at bed time for mood swings Continue Ativan PRN for anxiety We will check valproic acid level and competency metabolic panel on Tuesday morning Refer to psych social service regarding psychiatric inpatient bed No evidence of imminent risk to self or others at present.   Supportive therapy provided about ongoing stressors.  Appreciate psychiatric consultation and follow up tomorrow Please contact [phone] or [phone] if needs further assistance   Subjective:   [name] [name] is a 68 y.o. male patient admitted with grandiose delusions and bizarre behavior.  HPI: [name] [name] is an 68 y.o. male,  Caucasian seen and chart reviewed for psychiatric consultation and evaluation of bipolar disorder and medication management. He was admitted to medical floor for medical stabilization of sob due to COPD and Pneumonia. Patient has been suffering with increased irritability, pressured speech, racing thoughts, grandiose delusions and inappropriate and bizarre behaviors. His antipsychotic medication seroquel was discontinued during his recent admission at [facility] due to cardiac structural abnormality and repair of valve in the recent past. He was treated with depakote which he was non compliant while in out patient care. Patient denied suicidal or homicidal ideations, intention or plans. He was recently admitted to [facility] (from 09/07/14-09/12/14) due to aggressiveness and delusional thoughts but has not been taking his prescribed medications or keep his follow up appointment. Patient has no insight into his problem and has been exercising poor judgment.   Interval history: Patient continued to be manic versus a hypomanic with increased pressured speech, tangentiality, circumstantiality, grandiose delusions and bizarre behaviors. Patient has soft 4 points  Restraints, and his right hand was loosened to eat his meals. 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 Patient has a poor insight, judgment and impulse control. Patient has been compliant with his medication management and is slowly responding. Patient is seeking medication for breathing and minimizes need of medication for bipolar disorder. Patient reported he was relocated from [place] about a  year ago since then living in multiple places, motels in [place], [place] and [place] and has limited contact with family members and friends. Patient stated he receives a lot of money and he has more money to buy a land and build a house. Patient  claims DSS social worker overreacting and contacting the police to bring him to the hospital. Patient reported he has been noncompliant with the medication Depakote as it is calming him down. Will ask psychiatric social service to contact his a DSS social worker for appropriate psychosocial history and disposition plans.  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 He was petitioned for IVC by Charolette Forward with DSS [phone]. Per reports, patient has been extremely manic, has not been sleeping for days, having racing thoughts, mood lability, aggressive behavior, hostility and disorganized thought process.. He has been to emergency departments numerous times last week but always left against medical advice. Patient reportedly  had heart surgery recently and has COPD. He has  Not been using breathing equipment. 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 He also reports that he is working for [employer] and buying illegal drugs.   Past Psychiatric History: Past Medical History  Diagnosis Date  . 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 COPD (chronic obstructive pulmonary disease)   . Thoracic ascending aortic aneurysm     repaired 06/2014 at [facility]  . COPD (chronic obstructive pulmonary disease)   . BPH (benign prostatic hyperplasia)   . Pacemaker     battery replaced 06/2014 at [facility]  . SSS (sick sinus syndrome)     s/p PPM   . CAD (coronary artery disease)     ?MI in 2007  . Pneumothorax     traumatic 2007  . Anxiety and depression   . Psychosis   . Bipolar disorder     reports that he has quit smoking. His smoking use included Cigarettes. He smoked 1.00 pack per day. He does not have any smokeless tobacco history on file. He reports that he drinks alcohol. He reports that he uses illicit drugs (Marijuana). Family History  Problem Relation  Age of Onset  . CAD Father   . Diabetes Father   . High blood pressure Brother   . Cancer Brother    Family History Substance Abuse:  (Unable to assess/Pt refused to participate) Family Supports: Yes, List: (Two sons. Maciej Schweitzer is his POA.)) Living Arrangements: Other (Comment) (unknown) Can pt return to current living arrangement?: No Abuse/Neglect [facility]) Physical Abuse: Denies Verbal Abuse: Denies Sexual Abuse: Denies Allergies:   Allergies  Allergen Reactions  . Tiotropium Bromide Monohydrate Nausea Only and Anaphylaxis    Tremors    ACT Assessment Complete:  Yes:    Educational Status    Risk to Self: Risk to self with the past 6 months Suicidal Ideation: No (Unable to assess/Pt refused to participate) Suicidal Intent: No (Unable to assess/Pt refused to participate) Is patient at risk for suicide?: No (Unable to assess/Pt refused to participate) Suicidal Plan?: No (Unable to assess/Pt refused to participate) Access to Means: No (Unable to assess/Pt refused to participate) What has been your use of drugs/alcohol within the last 12 months?: Pt uses marijuana regularly Previous Attempts/Gestures: No (Unable to assess/Pt refused to participate) How many times?: 0 Other Self Harm Risks: None identified (Unable to assess/Pt refused to participate)  Triggers for Past Attempts: None known Intentional Self Injurious Behavior: None (Unable to assess/Pt refused to participate) Family Suicide History: Unable to assess Recent stressful life event(s): Other (Comment) (Unable to assess/Pt refused to participate) Persecutory voices/beliefs?: No (Unable to assess/Pt refused to participate) Depression: No (Unable to assess/Pt refused to participate) Depression Symptoms: Feeling angry/irritable Substance abuse history and/or treatment for substance abuse?: Yes Suicide prevention information given to non-admitted patients: Not applicable  Risk to Others: Risk to Others within the past 6  months Homicidal Ideation: No (Unable to assess/Pt refused to participate) Thoughts of Harm to Others: No (Unable to assess/Pt refused to participate) Current Homicidal Intent: No (Unable to assess/Pt refused to participate) Current Homicidal Plan: No (Unable to assess/Pt refused to participate) Access to Homicidal Means: No (Unable to assess/Pt refused to participate) Identified Victim: Unable to assess/Pt refused to participate History of harm to others?: No (Unable to assess/Pt refused to participate) Assessment of Violence: None Noted (Unable to assess/Pt refused to participate) Violent Behavior Description: Unable to assess/Pt refused to participate Does patient have access to weapons?: No (Unable to assess/Pt refused to participate) Criminal Charges Pending?: No (Unable to assess/Pt refused to participate) Does patient have a court date: No (Unable to assess/Pt refused to participate)  Abuse: Abuse/Neglect Assessment (Assessment to be complete while patient is alone) Physical Abuse: Denies Verbal Abuse: Denies Sexual Abuse: Denies Exploitation of patient/patient's resources: Denies Self-Neglect: Denies  Prior Inpatient Therapy: Prior Inpatient Therapy Prior Inpatient Therapy: Yes Prior Therapy Dates: In the last month Prior Therapy Facilty/Provider(s): [facility] in [place] Reason for Treatment: inpt mental health- psychosis  Prior Outpatient Therapy: Prior Outpatient Therapy Prior Outpatient Therapy: No Prior Therapy Dates: Unknown Prior Therapy Facilty/Provider(s): Unknown Reason for Treatment: Unknown  Additional Information: Additional Information 1:1 In Past 12 Months?: No CIRT Risk: No Elopement Risk: No Does patient have medical clearance?: Yes   Objective: Blood pressure 136/74, pulse 92, temperature 97.9 F (36.6 C), temperature source Axillary, resp. rate 20, height $RemoveBe'5\' 11"'IHkCLCasA$  (1.803 m), weight 70.489 kg (155 lb 6.4 oz), SpO2 91 %.Body mass index is 21.68  kg/(m^2). Results for orders placed or performed during the hospital encounter of 09/21/14 (from the past 72 hour(s))  CBG monitoring, ED     Status: Abnormal   Collection Time: 09/22/14  8:42 PM  Result Value Ref Range   Glucose-Capillary 123 (H) 70 - 99 mg/dL  Basic metabolic panel     Status: Abnormal   Collection Time: 09/24/14 12:19 AM  Result Value Ref Range   Sodium 147 137 - 147 mEq/L   Potassium 4.5 3.7 - 5.3 mEq/L   Chloride 104 96 - 112 mEq/L   CO2 33 (H) 19 - 32 mEq/L   Glucose, Bld 119 (H) 70 - 99 mg/dL   BUN 17 6 - 23 mg/dL   Creatinine, Ser 1.06 0.50 - 1.35 mg/dL   Calcium 9.4 8.4 - 10.5 mg/dL   GFR calc non Af Amer 70 (L) >90 mL/min   GFR calc Af Amer 81 (L) >90 mL/min    Comment: (NOTE) The eGFR has been calculated using the CKD EPI equation. This calculation has not been validated in all clinical situations. eGFR's persistently <90 mL/min signify possible Chronic Kidney Disease.    Anion gap 10 5 - 15  CBC with Differential     Status: Abnormal   Collection Time: 09/24/14 12:19 AM  Result Value Ref Range   WBC 13.7 (H) 4.0 - 10.5 K/uL   RBC 4.14 (L)  4.22 - 5.81 MIL/uL   Hemoglobin 12.1 (L) 13.0 - 17.0 g/dL   HCT 39.2 39.0 - 52.0 %   MCV 94.7 78.0 - 100.0 fL   MCH 29.2 26.0 - 34.0 pg   MCHC 30.9 30.0 - 36.0 g/dL   RDW 15.8 (H) 11.5 - 15.5 %   Platelets 395 150 - 400 K/uL   Neutrophils Relative % 68 43 - 77 %   Neutro Abs 9.3 (H) 1.7 - 7.7 K/uL   Lymphocytes Relative 22 12 - 46 %   Lymphs Abs 2.9 0.7 - 4.0 K/uL   Monocytes Relative 10 3 - 12 %   Monocytes Absolute 1.4 (H) 0.1 - 1.0 K/uL   Eosinophils Relative 0 0 - 5 %   Eosinophils Absolute 0.0 0.0 - 0.7 K/uL   Basophils Relative 0 0 - 1 %   Basophils Absolute 0.0 0.0 - 0.1 K/uL  Troponin I     Status: None   Collection Time: 09/24/14 12:19 AM  Result Value Ref Range   Troponin I <0.30 <0.30 ng/mL    Comment:        Due to the release kinetics of cTnI, a negative result within the first hours of  the onset of symptoms does not rule out myocardial infarction with certainty. If myocardial infarction is still suspected, repeat the test at appropriate intervals.   Pro b natriuretic peptide     Status: Abnormal   Collection Time: 09/24/14  7:04 AM  Result Value Ref Range   Pro B Natriuretic peptide (BNP) 798.5 (H) 0 - 125 pg/mL  Troponin I     Status: None   Collection Time: 09/24/14  7:04 AM  Result Value Ref Range   Troponin I <0.30 <0.30 ng/mL    Comment:        Due to the release kinetics of cTnI, a negative result within the first hours of the onset of symptoms does not rule out myocardial infarction with certainty. If myocardial infarction is still suspected, repeat the test at appropriate intervals.   Comprehensive metabolic panel     Status: Abnormal   Collection Time: 09/24/14  7:04 AM  Result Value Ref Range   Sodium 145 137 - 147 mEq/L   Potassium 4.6 3.7 - 5.3 mEq/L   Chloride 103 96 - 112 mEq/L   CO2 31 19 - 32 mEq/L   Glucose, Bld 210 (H) 70 - 99 mg/dL   BUN 16 6 - 23 mg/dL   Creatinine, Ser 0.80 0.50 - 1.35 mg/dL   Calcium 9.3 8.4 - 10.5 mg/dL   Total Protein 7.3 6.0 - 8.3 g/dL   Albumin 3.0 (L) 3.5 - 5.2 g/dL   AST 29 0 - 37 U/L   ALT 40 0 - 53 U/L   Alkaline Phosphatase 95 39 - 117 U/L   Total Bilirubin <0.2 (L) 0.3 - 1.2 mg/dL   GFR calc non Af Amer 90 (L) >90 mL/min   GFR calc Af Amer >90 >90 mL/min    Comment: (NOTE) The eGFR has been calculated using the CKD EPI equation. This calculation has not been validated in all clinical situations. eGFR's persistently <90 mL/min signify possible Chronic Kidney Disease.    Anion gap 11 5 - 15  CBC with Differential     Status: Abnormal   Collection Time: 09/24/14  7:04 AM  Result Value Ref Range   WBC 13.3 (H) 4.0 - 10.5 K/uL   RBC 4.30 4.22 - 5.81 MIL/uL  Hemoglobin 12.5 (L) 13.0 - 17.0 g/dL   HCT 40.8 39.0 - 52.0 %   MCV 94.9 78.0 - 100.0 fL   MCH 29.1 26.0 - 34.0 pg   MCHC 30.6 30.0 - 36.0 g/dL    RDW 15.9 (H) 11.5 - 15.5 %   Platelets 406 (H) 150 - 400 K/uL   Neutrophils Relative % 91 (H) 43 - 77 %   Neutro Abs 12.1 (H) 1.7 - 7.7 K/uL   Lymphocytes Relative 7 (L) 12 - 46 %   Lymphs Abs 0.9 0.7 - 4.0 K/uL   Monocytes Relative 2 (L) 3 - 12 %   Monocytes Absolute 0.3 0.1 - 1.0 K/uL   Eosinophils Relative 0 0 - 5 %   Eosinophils Absolute 0.0 0.0 - 0.7 K/uL   Basophils Relative 0 0 - 1 %   Basophils Absolute 0.0 0.0 - 0.1 K/uL  TSH     Status: Abnormal   Collection Time: 09/24/14  7:04 AM  Result Value Ref Range   TSH 0.053 (L) 0.350 - 4.500 uIU/mL    Comment: Performed at [facility] are reviewed and are pertinent for the above.  Current Facility-Administered Medications  Medication Dose Route Frequency Provider Last Rate Last Dose  . acetaminophen (TYLENOL) tablet 650 mg  650 mg Oral Q6H PRN Rise Patience, MD       Or  . acetaminophen (TYLENOL) suppository 650 mg  650 mg Rectal Q6H PRN Rise Patience, MD      . albuterol (PROVENTIL) (2.5 MG/3ML) 0.083% nebulizer solution 2.5 mg  2.5 mg Nebulization Q4H Rise Patience, MD   2.5 mg at 09/25/14 0915  . albuterol (PROVENTIL) (2.5 MG/3ML) 0.083% nebulizer solution 2.5 mg  2.5 mg Nebulization Q2H PRN Rise Patience, MD      . antiseptic oral rinse (CPC / CETYLPYRIDINIUM CHLORIDE 0.05%) solution 7 mL  7 mL Mouth Rinse BID Samuella Cota, MD   7 mL at 09/24/14 2135  . aspirin chewable tablet 81 mg  81 mg Oral Daily Mariea Clonts, MD   81 mg at 09/23/14 1028  . atorvastatin (LIPITOR) tablet 40 mg  40 mg Oral q1800 Mojeed Akintayo   40 mg at 09/23/14 1836  . budesonide (PULMICORT) nebulizer solution 0.25 mg  0.25 mg Nebulization BID Rise Patience, MD   0.25 mg at 09/25/14 0915  . ceFEPIme (MAXIPIME) 2 g in dextrose 5 % 50 mL IVPB  2 g Intravenous Q12H Rise Patience, MD   2 g at 09/25/14 [phone]  . divalproex (DEPAKOTE ER) 24 hr tablet 1,000 mg  1,000 mg Oral QHS Durward Parcel, MD   1,000 mg at 09/24/14 2132  . enoxaparin (LOVENOX) injection 40 mg  40 mg Subcutaneous Q24H Rise Patience, MD   40 mg at 09/24/14 0919  . folic acid (FOLVITE) tablet 1 mg  1 mg Oral Daily Rise Patience, MD   1 mg at 09/24/14 1000  . furosemide (LASIX) tablet 20 mg  20 mg Oral Daily Samuella Cota, MD   20 mg at 09/25/14 1100  . gabapentin (NEURONTIN) capsule 100 mg  100 mg Oral TID Mojeed Akintayo   100 mg at 09/24/14 2132  . Influenza vac split quadrivalent PF (FLUARIX) injection 0.5 mL  0.5 mL Intramuscular Tomorrow-1000 Rise Patience, MD   0.5 mL at 09/25/14 1000  . LORazepam (ATIVAN) injection 1 mg  1 mg Intravenous Q4H PRN Quillian Quince  Wiliam Ke, MD   1 mg at 09/25/14 [phone]  . multivitamin with minerals tablet 1 tablet  1 tablet Oral Daily Rise Patience, MD   1 tablet at 09/24/14 1000  . ondansetron (ZOFRAN) tablet 4 mg  4 mg Oral Q6H PRN Rise Patience, MD       Or  . ondansetron [facility]) injection 4 mg  4 mg Intravenous Q6H PRN Rise Patience, MD      . pneumococcal 23 valent vaccine (PNU-IMMUNE) injection 0.5 mL  0.5 mL Intramuscular Tomorrow-1000 Rise Patience, MD   0.5 mL at 09/25/14 1000  . sodium chloride 0.9 % injection 3 mL  3 mL Intravenous Q12H Rise Patience, MD   3 mL at 09/24/14 1000  . sodium chloride 0.9 % injection 3 mL  3 mL Intravenous Q12H Rise Patience, MD   3 mL at 09/24/14 2136  . tamsulosin (FLOMAX) capsule 0.4 mg  0.4 mg Oral QPC supper Mojeed Akintayo   0.4 mg at 09/24/14 1705  . thiamine (VITAMIN B-1) tablet 100 mg  100 mg Oral Daily Rise Patience, MD   100 mg at 09/24/14 1000   Or  . thiamine (B-1) injection 100 mg  100 mg Intravenous Daily Rise Patience, MD        Psychiatric Specialty Exam:     Blood pressure 136/74, pulse 92, temperature 97.9 F (36.6 C), temperature source Axillary, resp. rate 20, height $RemoveBe'5\' 11"'RLcJLCpvJ$  (1.803 m), weight 70.489 kg (155 lb 6.4 oz), SpO2 91 %.Body mass  index is 21.68 kg/(m^2).  General Appearance: Disheveled  Eye Contact::  Minimal  Speech:  Garbled  Volume:  Increased  Mood:  Angry and Irritable  Affect:  Labile  Thought Process:  Circumstantial and Disorganized  Orientation:  Full (Time, Place, and Person)  Thought Content:  Delusions and grandiose  Suicidal Thoughts:  No  Homicidal Thoughts:  No  Memory:  Immediate;   Fair Recent;   Fair Remote;   Fair  Judgement:  Impaired  Insight:  Lacking  Psychomotor Activity:  Increased  Concentration:  Poor  Recall:  [place]: Good  Akathisia:  No  Handed:  Right  AIMS (if indicated):     Assets:  Communication Skills  Sleep:   poor   Musculoskeletal: Strength & Muscle Tone: within normal limits Gait & Station: normal Patient leans: N/A  Treatment Plan Summary: Daily contact with patient to assess and evaluate symptoms and progress in treatment Medication management  Continue Depakote ER 500 mg ii PO Qhs for bipolar mania and will check valproic acid level and comprehensive metabolic panel on Tuesday morning for therapeutic values and appropriate adjustment if needed  Ursala Cressy,JANARDHAHA R., MD 09/25/2014 11:29 AM

## 2014-09-25 NOTE — Progress Notes (Signed)
Pt has been out of restraint since 1230. Pt is more clam and is doing well with 1:1 sitter. Julio SicksK. Kysha Muralles RN

## 2014-09-26 DIAGNOSIS — F3010 Manic episode without psychotic symptoms, unspecified: Secondary | ICD-10-CM

## 2014-09-26 MED ORDER — NICOTINE 14 MG/24HR TD PT24
14.0000 mg | MEDICATED_PATCH | Freq: Every day | TRANSDERMAL | Status: DC
  Administered 2014-09-26 – 2014-10-01 (×2): 14 mg via TRANSDERMAL
  Filled 2014-09-26 (×9): qty 1

## 2014-09-26 MED ORDER — HYDROXYZINE HCL 50 MG PO TABS
50.0000 mg | ORAL_TABLET | Freq: Every day | ORAL | Status: DC
Start: 2014-09-26 — End: 2014-10-04
  Administered 2014-09-28 – 2014-10-03 (×6): 50 mg via ORAL
  Filled 2014-09-26 (×10): qty 1

## 2014-09-26 MED ORDER — GABAPENTIN 100 MG PO CAPS
200.0000 mg | ORAL_CAPSULE | Freq: Three times a day (TID) | ORAL | Status: DC
  Administered 2014-09-26 – 2014-09-28 (×3): 200 mg via ORAL
  Filled 2014-09-26 (×8): qty 2

## 2014-09-26 MED ORDER — LORAZEPAM 2 MG/ML IJ SOLN
0.5000 mg | Freq: Once | INTRAMUSCULAR | Status: AC
  Administered 2014-09-26: 0.5 mg via INTRAMUSCULAR
  Filled 2014-09-26: qty 1

## 2014-09-26 NOTE — Clinical Social Work Note (Signed)
[facility] of [facility] this afternoon to inquire about patient. This CSW was unaware of patient and needs- [facility] assessment and workup for inpatient psych transfer. Per Psych MD's note today, [facility], [facility] available at this time. CSW to f/u tomorrow for further assessment and planning.  Reece LevyJanet Nick Stults, MSW, Theresia MajorsLCSWA [phone]

## 2014-09-26 NOTE — Plan of Care (Signed)
Problem: Phase III Progression Outcomes Goal: Discharge plan remains appropriate-arrangements made Outcome: Progressing     

## 2014-09-26 NOTE — Progress Notes (Signed)
PROGRESS NOTE  [name] [name] WUJ:[id number] DOB: 10/19/1946 DOA: 09/21/2014 PCP: No PCP Per Patient  Summary: 68 year old man with recurrent severe manic and psychotic features presented to the ER for the seventh time in the last month 11/25. He presented with IVC papers. He was noted to be delusional at hotel, displayed belligerent behavior in ED, requiring IM injection, police restraint and shackling to the bed. [facility]. 11/27 Psychiatrist recommended inpatient psych treatment. He was extremely uncooperative and transferred to the emergency department for shortness of breath. There he was found to have oxygen saturation 89% on room air with diffuse wheezes. According to admission documentation he appeared mildly short of breath. He was admitted for COPD exacerbation, possible mild CHF, possible pneumonia. His condition rapidly improved with Lasix, antibiotics and breathing treatments. His condition is stabilized nares clear for transfer to inpatient psychiatric facility per psychiatry.  Assessment/Plan: 1. Bipolar disorder with recurrent severe manic, psychotic features. Overall improving. Management per psychiatry. 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 No focal deficits noted. CT head negative 06/2014 at [facility] (no MRI/pacemaker). 2. Mild COPD exacerbation, resolved. Avoid steroids at this point given clinical improvement. 3. Mild acute diastolic heart failure. Appears resolved at this point. Continue low-dose Lasix. 4. Possible pneumonia. Clinically resolved. Afebrile. Continue oral antibiotics. 5. Abnormal TSH. Significance unclear. TSH 0.357 November 12, 0.053 November 28. Free T4, T3 total within normal limits November 12. Repeat T4 normal, T3 slightly low. Overall doubt clinically significant. He has no other features to suggest hyperthyroidism. He seems to be responding to Depakote. Continue  treatment per psychiatry. 6. [facility] not on anticoagulation. Continue aspirin, statin. 7. Status post pacemaker placement; battery replaced September 2015 and [facility] because the battery had run out and the patient failed to follow-up.  8. Prolonged QTc. Avoid antipsychotics.   Psychiatry has recommended increasing Neurontin 200 mg by mouth 3 times a day concerning hydroxyzine. Continue Depakote. Plans to check valproic acid level and CMP. Plan for inpatient stabilization thereafter.  In regard to his risk for status this appears to be close to baseline. Hypoxia has resolved. COPD appears at baseline. Now on oral antibiotics.  Continue oral antibiotics.  Continue breathing treatments.   Continue oral Lasix.  Appears medically clear.  Code Status: full code DVT prophylaxis: Lovenox Family Communication:  Disposition Plan: Psychiatric inpatient treatment  [name], MD  [facility]  Pager [phone] If 7PM-7AM, please contact night-coverage at [email or web address], password [email or web address] 09/26/2014, 5:47 PM  LOS: 5 days   Consultants:  Psychiatry   Procedures:    Antibiotics:  Cefepime 11/28 >>11/29  HPI/Subjective: He feels like his breathing is doing okay. He has no other complaints.  Objective: Filed Vitals:   09/26/14 0521 09/26/14 0641 09/26/14 1300 09/26/14 1633  BP: 115/71  119/59   Pulse: 89  100   Temp: 98.7 F (37.1 C)  97.7 F (36.5 C)   TempSrc: Oral  Oral   Resp: 20  20   Height:      Weight:      SpO2: 89% 98% 94% 95%    Intake/Output Summary (Last 24 hours) at 09/26/14 1747 Last data filed at 09/26/14 1300  Gross per 24 hour  Intake    360 ml  Output      0 ml  Net  360 ml     Filed Weights   09/21/14 2326 09/24/14 0439 09/24/14 0540  Weight: 74.844 kg (165 lb) 70.489 kg (155 lb 6.4 oz) 70.489 kg (155  lb 6.4 oz)    Exam:     Afebrile, vital signs are stable. No hypoxia.  Appears calm, comfortable. Standing up and room.  Alert. Speech fluent and clear. Not very talkative today.  Cardiovascular regular rate and rhythm. No murmur, rub or gallop. No lower extremity edema.  Respiratory fair air movement. No frank wheezes, rales or rhonchi. Normal respiratory effort.  Data Reviewed: Free T4 within normal limits. T3 slightly low. The studies were just performed 17 days ago and were unremarkable.  Scheduled Meds: . albuterol  2.5 mg Nebulization Q4H  . antiseptic oral rinse  7 mL Mouth Rinse BID  . aspirin  81 mg Oral Daily  . atorvastatin  40 mg Oral q1800  . budesonide (PULMICORT) nebulizer solution  0.25 mg Nebulization BID  . cefUROXime  500 mg Oral BID WC  . divalproex  1,000 mg Oral QHS  . folic acid  1 mg Oral Daily  . furosemide  20 mg Oral Daily  . gabapentin  200 mg Oral TID  . hydrOXYzine  50 mg Oral QHS  . Influenza vac split quadrivalent PF  0.5 mL Intramuscular Tomorrow-1000  . multivitamin with minerals  1 tablet Oral Daily  . nicotine  14 mg Transdermal Daily  . pneumococcal 23 valent vaccine  0.5 mL Intramuscular Tomorrow-1000  . polyethylene glycol  17 g Oral BID  . sodium chloride  3 mL Intravenous Q12H  . sodium chloride  3 mL Intravenous Q12H  . tamsulosin  0.4 mg Oral QPC supper  . thiamine  100 mg Oral Daily   Or  . thiamine  100 mg Intravenous Daily   Continuous Infusions:   Principal Problem:   COPD exacerbation Active Problems:   Bipolar I disorder, single manic episode, severe, with psychosis   H/O aortic valve replacement   History of permanent cardiac pacemaker placement   SOB (shortness of breath)   HCAP (healthcare-associated pneumonia)   Acute diastolic CHF (congestive heart failure)   Time spent 15 minutes  Summary: ER 11/5-11/9: IVC placed for psychosis at [facility]. Psychiatry recommended inpatient treatment. Patient with  combativeness required Ativan, Haldol, Geodon, police presence and shackling to bed. [facility]  [facility]16 hospitalized for bipolar disorder, recurrent, severe, manic with psychotic features. Antipsychotics were avoided because of QT C elevation. Treated with Depakote ER 1000 mg QHS, Trazodone 50 mg QHS PRN sleep and Ativan 0.5 mg as needed to address agitation and mania.  ER 11/18 presented with chest pain. Patient left. 11/22 complaint of shortness of breath, cough, ran out of inhaler. Discharged with prescription for inhaler. 11/22 second visit same day for albuterol neb Rx. Left AMA, verbally abusive. 11/24 SOB. Left AMA. Refused interventions. 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 Treated for COPD exacerbation

## 2014-09-26 NOTE — Treatment Plan (Signed)
Consulted case with Dr. El[facility]lowEappen, [facility], who felt that pt's chronic medical conditions made him most appropriate for a gero-psych unit versus short stay crisis stabilization.

## 2014-09-26 NOTE — Progress Notes (Signed)
Patient continue to get out of room, will not stay in bed most of the time, requiring staff to call security for assistance 3-4times to get him back to the room. Will continue to assess patient.

## 2014-09-26 NOTE — Consult Note (Signed)
Psychiatry Consult follow up note  Reason for Consult:  Medication management for bipolar disorder and disposition plans Referring Physician: Samuella Cota, MD  [name] [name] is an 68 y.o. male. Total Time spent with patient: 30 minutes  Assessment: AXIS I:  Bipolar disorder, most recent episode is mania AXIS II:  Deferred AXIS III:   Past Medical History  Diagnosis Date  . 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 COPD (chronic obstructive pulmonary disease)   . Thoracic ascending aortic aneurysm     repaired 06/2014 at [facility]  . COPD (chronic obstructive pulmonary disease)   . BPH (benign prostatic hyperplasia)   . Pacemaker     battery replaced 06/2014 at [facility]  . SSS (sick sinus syndrome)     s/p PPM   . CAD (coronary artery disease)     ?MI in 2007  . Pneumothorax     traumatic 2007  . Anxiety and depression   . Psychosis   . Bipolar disorder    AXIS IV:  economic problems, other psychosocial or environmental problems and problems related to social environment AXIS V:  21-30 behavior considerably influenced by delusions or hallucinations OR serious impairment in judgment, communication OR inability to function in almost all areas  Plan:  Case discussed with Wells Guiles, [facility] from [facility] for possible psych bed Increase neurontin 200 mg PO TID for neuropathy and mood and start Hydroxyzine 50 mg PO Qhs for insomnia Continue Depakote ER 500 mg two tablet at bed time for mood swings Continue Ativan PRN for anxiety We will check valproic acid level and competency metabolic panel on Tuesday morning Refer to psych social service regarding psychiatric inpatient bed No evidence of imminent risk to self or others at present.   Supportive therapy provided about ongoing stressors.  Appreciate psychiatric consultation and follow up tomorrow Please contact [phone] or [phone] if needs further assistance   Subjective:   [name] [name]  is a 68 y.o. male patient admitted with grandiose delusions and bizarre behavior.  HPI: [name] [name] is an 68 y.o. male, Caucasian seen and chart reviewed for psychiatric consultation and evaluation of bipolar disorder and medication management. He was admitted to medical floor for medical stabilization of sob due to COPD and Pneumonia. Patient has been suffering with increased irritability, pressured speech, racing thoughts, grandiose delusions and inappropriate and bizarre behaviors. His antipsychotic medication seroquel was discontinued during his recent admission at [facility] due to cardiac structural abnormality and repair of valve in the recent past. He was treated with depakote which he was non compliant while in out patient care. Patient denied suicidal or homicidal ideations, intention or plans. He was recently admitted to [facility] (from 09/07/14-09/12/14) due to aggressiveness and delusional thoughts but has not been taking his prescribed medications or keep his follow up appointment. Patient has no insight into his problem and has been exercising poor judgment.   Interval history: Staff RN reported that patient has been trying to walk out of hospital and swinging last night and required security to come and calm down him. Patient is groggy this morning due to not sleeping and taken Ativan PRN. Try to reach DSS SW without success.   Patient continued to be manic versus a hypomanic with increased pressured speech, tangentiality, circumstantiality, grandiose delusions and bizarre behaviors. Patient has soft 4 points  Restraints, and his right hand was loosened to eat his meals.  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 Patient has a poor insight, judgment and impulse control.   Patient has been compliant with his medication management and is slowly responding. Patient is seeking  medication for breathing and minimizes need of medication for bipolar disorder. Patient reported he was relocated from [place] about a year ago since then living in multiple places, motels in [place], [place] and [place] and has limited contact with family members and friends. Patient stated he receives a lot of money and he has more money to buy a land and build a house. Patient claims DSS social worker overreacting and contacting the police to bring him to the hospital. Patient reported he has been noncompliant with the medication Depakote as it is calming him down. Will ask psychiatric social service to contact his a DSS social worker for appropriate psychosocial history and disposition plans.  Medical history:  Patient with history of Bipolar disorder who presents to [facility] ED by Event organiser. He was petitioned for IVC by Charolette Forward with DSS [phone]. Per reports, patient has been extremely manic, has not been sleeping for days, having racing thoughts, mood lability, aggressive behavior, hostility and disorganized thought process.. He has been to emergency departments numerous times last week but always left against medical advice. Patient reportedly  had heart surgery recently and has COPD. He has  Not been using breathing equipment. 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 He also reports that he is working for [employer] and buying illegal drugs.   Past Psychiatric History: Past Medical History  Diagnosis Date  . Severe aortic valve stenosis     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 at [facility]  . COPD (chronic obstructive pulmonary disease)   . Thoracic ascending aortic aneurysm     repaired 06/2014 at [facility]  . COPD (chronic obstructive pulmonary disease)   . BPH (benign prostatic hyperplasia)   . Pacemaker     battery replaced 06/2014 at [facility]  . SSS (sick sinus syndrome)     s/p PPM   . CAD (coronary artery disease)     ?MI  in 2007  . Pneumothorax     traumatic 2007  . Anxiety and depression   . Psychosis   . Bipolar disorder     reports that he has quit smoking. His smoking use included Cigarettes. He smoked 1.00 pack per day. He does not have any smokeless tobacco history on file. He reports that he drinks alcohol. He reports that he uses illicit drugs (Marijuana). Family History  Problem Relation Age of Onset  . CAD Father   . Diabetes Father   . High blood pressure Brother   . Cancer Brother    Family History Substance Abuse:  (Unable to assess/Pt refused to participate) Family Supports: Yes, List: (Two sons. Jathan Balling is his POA.)) Living Arrangements: Alone Can pt return to current living arrangement?: No Abuse/Neglect [facility]) Physical Abuse: Denies Verbal Abuse: Denies Sexual Abuse: Denies Allergies:   Allergies  Allergen Reactions  . Tiotropium Bromide Monohydrate Nausea Only and Anaphylaxis    Tremors    ACT Assessment Complete:  Yes:    Educational Status    Risk to Self: Risk to self with the past 6 months Suicidal Ideation: No (Unable to assess/Pt refused to participate) Suicidal Intent: No (Unable to assess/Pt refused to participate) Is patient  at risk for suicide?: No (Unable to assess/Pt refused to participate) Suicidal Plan?: No (Unable to assess/Pt refused to participate) Access to Means: No (Unable to assess/Pt refused to participate) What has been your use of drugs/alcohol within the last 12 months?: Pt uses marijuana regularly Previous Attempts/Gestures: No (Unable to assess/Pt refused to participate) How many times?: 0 Other Self Harm Risks: None identified (Unable to assess/Pt refused to participate) Triggers for Past Attempts: None known Intentional Self Injurious Behavior: None (Unable to assess/Pt refused to participate) Family Suicide History: Unable to assess Recent stressful life event(s): Other (Comment) (Unable to assess/Pt refused to  participate) Persecutory voices/beliefs?: No (Unable to assess/Pt refused to participate) Depression: No (Unable to assess/Pt refused to participate) Depression Symptoms: Feeling angry/irritable Substance abuse history and/or treatment for substance abuse?: Yes Suicide prevention information given to non-admitted patients: Not applicable  Risk to Others: Risk to Others within the past 6 months Homicidal Ideation: No (Unable to assess/Pt refused to participate) Thoughts of Harm to Others: No (Unable to assess/Pt refused to participate) Current Homicidal Intent: No (Unable to assess/Pt refused to participate) Current Homicidal Plan: No (Unable to assess/Pt refused to participate) Access to Homicidal Means: No (Unable to assess/Pt refused to participate) Identified Victim: Unable to assess/Pt refused to participate History of harm to others?: No (Unable to assess/Pt refused to participate) Assessment of Violence: None Noted (Unable to assess/Pt refused to participate) Violent Behavior Description: Unable to assess/Pt refused to participate Does patient have access to weapons?: No (Unable to assess/Pt refused to participate) Criminal Charges Pending?: No (Unable to assess/Pt refused to participate) Does patient have a court date: No (Unable to assess/Pt refused to participate)  Abuse: Abuse/Neglect Assessment (Assessment to be complete while patient is alone) Physical Abuse: Denies Verbal Abuse: Denies Sexual Abuse: Denies Exploitation of patient/patient's resources: Denies Self-Neglect: Denies  Prior Inpatient Therapy: Prior Inpatient Therapy Prior Inpatient Therapy: Yes Prior Therapy Dates: In the last month Prior Therapy Facilty/Provider(s): [facility] in [place] Reason for Treatment: inpt mental health- psychosis  Prior Outpatient Therapy: Prior Outpatient Therapy Prior Outpatient Therapy: No Prior Therapy Dates: Unknown Prior Therapy Facilty/Provider(s): Unknown Reason for  Treatment: Unknown  Additional Information: Additional Information 1:1 In Past 12 Months?: No CIRT Risk: No Elopement Risk: No Does patient have medical clearance?: Yes   Objective: Blood pressure 115/71, pulse 89, temperature 98.7 F (37.1 C), temperature source Oral, resp. rate 20, height $RemoveBe'5\' 11"'HGgOpBlAE$  (1.803 m), weight 70.489 kg (155 lb 6.4 oz), SpO2 98 %.Body mass index is 21.68 kg/(m^2). Results for orders placed or performed during the hospital encounter of 09/21/14 (from the past 72 hour(s))  Basic metabolic panel     Status: Abnormal   Collection Time: 09/24/14 12:19 AM  Result Value Ref Range   Sodium 147 137 - 147 mEq/L   Potassium 4.5 3.7 - 5.3 mEq/L   Chloride 104 96 - 112 mEq/L   CO2 33 (H) 19 - 32 mEq/L   Glucose, Bld 119 (H) 70 - 99 mg/dL   BUN 17 6 - 23 mg/dL   Creatinine, Ser 1.06 0.50 - 1.35 mg/dL   Calcium 9.4 8.4 - 10.5 mg/dL   GFR calc non Af Amer 70 (L) >90 mL/min   GFR calc Af Amer 81 (L) >90 mL/min    Comment: (NOTE) The eGFR has been calculated using the CKD EPI equation. This calculation has not been validated in all clinical situations. eGFR's persistently <90 mL/min signify possible Chronic Kidney Disease.    Anion gap 10  5 - 15  CBC with Differential     Status: Abnormal   Collection Time: 09/24/14 12:19 AM  Result Value Ref Range   WBC 13.7 (H) 4.0 - 10.5 K/uL   RBC 4.14 (L) 4.22 - 5.81 MIL/uL   Hemoglobin 12.1 (L) 13.0 - 17.0 g/dL   HCT 39.2 39.0 - 52.0 %   MCV 94.7 78.0 - 100.0 fL   MCH 29.2 26.0 - 34.0 pg   MCHC 30.9 30.0 - 36.0 g/dL   RDW 15.8 (H) 11.5 - 15.5 %   Platelets 395 150 - 400 K/uL   Neutrophils Relative % 68 43 - 77 %   Neutro Abs 9.3 (H) 1.7 - 7.7 K/uL   Lymphocytes Relative 22 12 - 46 %   Lymphs Abs 2.9 0.7 - 4.0 K/uL   Monocytes Relative 10 3 - 12 %   Monocytes Absolute 1.4 (H) 0.1 - 1.0 K/uL   Eosinophils Relative 0 0 - 5 %   Eosinophils Absolute 0.0 0.0 - 0.7 K/uL   Basophils Relative 0 0 - 1 %   Basophils Absolute 0.0 0.0  - 0.1 K/uL  Troponin I     Status: None   Collection Time: 09/24/14 12:19 AM  Result Value Ref Range   Troponin I <0.30 <0.30 ng/mL    Comment:        Due to the release kinetics of cTnI, a negative result within the first hours of the onset of symptoms does not rule out myocardial infarction with certainty. If myocardial infarction is still suspected, repeat the test at appropriate intervals.   Pro b natriuretic peptide     Status: Abnormal   Collection Time: 09/24/14  7:04 AM  Result Value Ref Range   Pro B Natriuretic peptide (BNP) 798.5 (H) 0 - 125 pg/mL  Troponin I     Status: None   Collection Time: 09/24/14  7:04 AM  Result Value Ref Range   Troponin I <0.30 <0.30 ng/mL    Comment:        Due to the release kinetics of cTnI, a negative result within the first hours of the onset of symptoms does not rule out myocardial infarction with certainty. If myocardial infarction is still suspected, repeat the test at appropriate intervals.   Comprehensive metabolic panel     Status: Abnormal   Collection Time: 09/24/14  7:04 AM  Result Value Ref Range   Sodium 145 137 - 147 mEq/L   Potassium 4.6 3.7 - 5.3 mEq/L   Chloride 103 96 - 112 mEq/L   CO2 31 19 - 32 mEq/L   Glucose, Bld 210 (H) 70 - 99 mg/dL   BUN 16 6 - 23 mg/dL   Creatinine, Ser 0.80 0.50 - 1.35 mg/dL   Calcium 9.3 8.4 - 10.5 mg/dL   Total Protein 7.3 6.0 - 8.3 g/dL   Albumin 3.0 (L) 3.5 - 5.2 g/dL   AST 29 0 - 37 U/L   ALT 40 0 - 53 U/L   Alkaline Phosphatase 95 39 - 117 U/L   Total Bilirubin <0.2 (L) 0.3 - 1.2 mg/dL   GFR calc non Af Amer 90 (L) >90 mL/min   GFR calc Af Amer >90 >90 mL/min    Comment: (NOTE) The eGFR has been calculated using the CKD EPI equation. This calculation has not been validated in all clinical situations. eGFR's persistently <90 mL/min signify possible Chronic Kidney Disease.    Anion gap 11 5 - 15  CBC with  Differential     Status: Abnormal   Collection Time: 09/24/14  7:04  AM  Result Value Ref Range   WBC 13.3 (H) 4.0 - 10.5 K/uL   RBC 4.30 4.22 - 5.81 MIL/uL   Hemoglobin 12.5 (L) 13.0 - 17.0 g/dL   HCT 40.8 39.0 - 52.0 %   MCV 94.9 78.0 - 100.0 fL   MCH 29.1 26.0 - 34.0 pg   MCHC 30.6 30.0 - 36.0 g/dL   RDW 15.9 (H) 11.5 - 15.5 %   Platelets 406 (H) 150 - 400 K/uL   Neutrophils Relative % 91 (H) 43 - 77 %   Neutro Abs 12.1 (H) 1.7 - 7.7 K/uL   Lymphocytes Relative 7 (L) 12 - 46 %   Lymphs Abs 0.9 0.7 - 4.0 K/uL   Monocytes Relative 2 (L) 3 - 12 %   Monocytes Absolute 0.3 0.1 - 1.0 K/uL   Eosinophils Relative 0 0 - 5 %   Eosinophils Absolute 0.0 0.0 - 0.7 K/uL   Basophils Relative 0 0 - 1 %   Basophils Absolute 0.0 0.0 - 0.1 K/uL  TSH     Status: Abnormal   Collection Time: 09/24/14  7:04 AM  Result Value Ref Range   TSH 0.053 (L) 0.350 - 4.500 uIU/mL    Comment: Performed at [facility]  T4, free     Status: None   Collection Time: 09/25/14  6:05 AM  Result Value Ref Range   Free T4 1.32 0.80 - 1.80 ng/dL    Comment: Performed at [facility]  T3     Status: Abnormal   Collection Time: 09/25/14  6:05 AM  Result Value Ref Range   T3, Total 69.2 (L) 80.0 - 204.0 ng/dl    Comment: Performed at [employer] are reviewed and are pertinent for the above.  Current Facility-Administered Medications  Medication Dose Route Frequency Provider Last Rate Last Dose  . acetaminophen (TYLENOL) tablet 650 mg  650 mg Oral Q6H PRN Rise Patience, MD       Or  . acetaminophen (TYLENOL) suppository 650 mg  650 mg Rectal Q6H PRN Rise Patience, MD      . albuterol (PROVENTIL) (2.5 MG/3ML) 0.083% nebulizer solution 2.5 mg  2.5 mg Nebulization Q4H Rise Patience, MD   2.5 mg at 09/26/14 6761  . albuterol (PROVENTIL) (2.5 MG/3ML) 0.083% nebulizer solution 2.5 mg  2.5 mg Nebulization Q2H PRN Rise Patience, MD   2.5 mg at 09/26/14 0641  . antiseptic oral rinse (CPC / CETYLPYRIDINIUM CHLORIDE 0.05%) solution 7 mL   7 mL Mouth Rinse BID Samuella Cota, MD   7 mL at 09/25/14 2300  . aspirin chewable tablet 81 mg  81 mg Oral Daily Mariea Clonts, MD   81 mg at 09/26/14 1006  . atorvastatin (LIPITOR) tablet 40 mg  40 mg Oral q1800 Mojeed Akintayo   40 mg at 09/25/14 1726  . budesonide (PULMICORT) nebulizer solution 0.25 mg  0.25 mg Nebulization BID Rise Patience, MD   0.25 mg at 09/26/14 9509  . cefUROXime (CEFTIN) tablet 500 mg  500 mg Oral BID WC Samuella Cota, MD   500 mg at 09/26/14 3267  . divalproex (DEPAKOTE ER) 24 hr tablet 1,000 mg  1,000 mg Oral QHS Durward Parcel, MD   1,000 mg at 09/25/14 2246  . folic acid (FOLVITE) tablet 1 mg  1 mg Oral Daily Arshad N  Hal Hope, MD   1 mg at 09/26/14 1007  . furosemide (LASIX) tablet 20 mg  20 mg Oral Daily Samuella Cota, MD   20 mg at 09/26/14 1006  . gabapentin (NEURONTIN) capsule 100 mg  100 mg Oral TID Mojeed Akintayo   100 mg at 09/26/14 1007  . Influenza vac split quadrivalent PF (FLUARIX) injection 0.5 mL  0.5 mL Intramuscular Tomorrow-1000 Rise Patience, MD   0.5 mL at 09/25/14 1000  . LORazepam (ATIVAN) injection 1 mg  1 mg Intramuscular Q6H PRN Samuella Cota, MD   1 mg at 09/26/14 4128  . multivitamin with minerals tablet 1 tablet  1 tablet Oral Daily Rise Patience, MD   1 tablet at 09/26/14 1006  . nicotine (NICODERM CQ - dosed in mg/24 hours) patch 14 mg  14 mg Transdermal Daily Samuella Cota, MD   14 mg at 09/26/14 1102  . pneumococcal 23 valent vaccine (PNU-IMMUNE) injection 0.5 mL  0.5 mL Intramuscular Tomorrow-1000 Rise Patience, MD   0.5 mL at 09/25/14 1000  . polyethylene glycol (MIRALAX / GLYCOLAX) packet 17 g  17 g Oral BID Samuella Cota, MD   17 g at 09/26/14 1006  . sodium chloride 0.9 % injection 3 mL  3 mL Intravenous Q12H Rise Patience, MD   3 mL at 09/24/14 1000  . sodium chloride 0.9 % injection 3 mL  3 mL Intravenous Q12H Rise Patience, MD   3 mL at 09/24/14 2136  .  tamsulosin (FLOMAX) capsule 0.4 mg  0.4 mg Oral QPC supper Mojeed Akintayo   0.4 mg at 09/25/14 1728  . thiamine (VITAMIN B-1) tablet 100 mg  100 mg Oral Daily Rise Patience, MD   100 mg at 09/26/14 1006   Or  . thiamine (B-1) injection 100 mg  100 mg Intravenous Daily Rise Patience, MD        Psychiatric Specialty Exam:     Blood pressure 115/71, pulse 89, temperature 98.7 F (37.1 C), temperature source Oral, resp. rate 20, height $RemoveBe'5\' 11"'VzBqcoXhx$  (1.803 m), weight 70.489 kg (155 lb 6.4 oz), SpO2 98 %.Body mass index is 21.68 kg/(m^2).  General Appearance: Disheveled  Eye Contact::  Minimal  Speech:  Garbled  Volume:  Increased  Mood:  Angry and Irritable  Affect:  Labile  Thought Process:  Circumstantial and Disorganized  Orientation:  Full (Time, Place, and Person)  Thought Content:  Delusions and grandiose  Suicidal Thoughts:  No  Homicidal Thoughts:  No  Memory:  Immediate;   Fair Recent;   Fair Remote;   Fair  Judgement:  Impaired  Insight:  Lacking  Psychomotor Activity:  Increased  Concentration:  Poor  Recall:  [place]: Good  Akathisia:  No  Handed:  Right  AIMS (if indicated):     Assets:  Communication Skills  Sleep:   poor   Musculoskeletal: Strength & Muscle Tone: within normal limits Gait & Station: normal Patient leans: N/A  Treatment Plan Summary: Daily contact with patient to assess and evaluate symptoms and progress in treatment Medication management  Continue Depakote ER 500 mg ii PO Qhs for bipolar mania and will check valproic acid level and comprehensive metabolic panel on Tuesday morning for therapeutic values and appropriate adjustment if needed Increase neurontin 200 mg PO TID for neuropathy and mood and start Hydroxyzine 50 mg PO Qhs for insomnia  Emelin Dascenzo,JANARDHAHA R., MD 09/26/2014 1:09 PM

## 2014-09-27 MED ORDER — ALBUTEROL SULFATE (2.5 MG/3ML) 0.083% IN NEBU
2.5000 mg | INHALATION_SOLUTION | Freq: Four times a day (QID) | RESPIRATORY_TRACT | Status: DC
  Administered 2014-09-27 – 2014-10-04 (×29): 2.5 mg via RESPIRATORY_TRACT
  Filled 2014-09-27 (×30): qty 3

## 2014-09-27 NOTE — Progress Notes (Addendum)
Clinical Social Work  CSW contacted the following hospitals for bed availability:  [facility]- available beds. Referral faxed  [facility]  [facility]  [facility]  [facility]- no available beds  [name]- available beds. Referral faxed. (Addendum 1143: denied due to being over age limit of [id number])  [facility]- message left for admissions re: bed availability  [facility]- [facility] beds. Referral faxed.  [facility]- available beds. Referral faxed.  [facility]- no available beds.  [facility]- no [facility] beds  [facility]- message left for admission re: bed availability   [facility]  [facility]- no available beds  [facility]- [facility] beds. Referral faxed.  [facility]- [facility] due to [facility]  [facility]- available beds. Referral faxed.  [facility]- no available beds  [facility]- no available beds  [facility]  [facility]- no available beds  CSW will continue to follow.  [place] [place], [place] [phone]

## 2014-09-27 NOTE — Plan of Care (Signed)
Problem: Discharge Progression Outcomes Goal: Tolerating diet Outcome: Completed/Met Date Met:  09/27/14 Goal: Activity appropriate for discharge plan Outcome: Completed/Met Date Met:  09/27/14

## 2014-09-27 NOTE — Progress Notes (Signed)
PROGRESS NOTE  [name] [name] JXB:[id number] DOB: 09/12/1946 DOA: 09/21/2014 PCP: No PCP Per Patient  Summary: 68 year old man with recurrent severe manic and psychotic features presented to the ER for the seventh time in the last month 11/25. He presented with IVC papers. He was noted to be delusional at hotel, displayed belligerent behavior in ED, requiring IM injection, police restraint and shackling to the bed. Transferred to [facility] ED. 11/27 Psychiatrist recommended inpatient psych treatment. He was extremely uncooperative and transferred to the emergency department for shortness of breath. There he was found to have oxygen saturation 89% on room air with diffuse wheezes. According to admission documentation he appeared mildly short of breath. He was admitted for COPD exacerbation, possible mild CHF, possible pneumonia. His condition rapidly improved with Lasix, antibiotics and breathing treatments. His condition is stabilized nares clear for transfer to inpatient psychiatric facility per psychiatry.  Assessment/Plan: 1. Bipolar disorder with recurrent severe manic, psychotic features. Overall improving. Management per psychiatry. 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 No focal deficits noted. CT head negative 06/2014 at [facility] (no MRI/pacemaker). Continue Depakote, Neurontin. 2. Mild COPD exacerbation, resolved. Avoid steroids at this point given clinical improvement. 3. Mild acute diastolic heart failure. Appears resolved at this point. Continue low-dose Lasix. 4. Possible pneumonia. Clinically resolved. Afebrile. Continue oral antibiotics. 5. Abnormal TSH. Significance unclear. TSH 0.357 November 12, 0.053 November 28. Free T4, T3 total within normal limits November 12. Repeat T4 normal, T3 slightly low. Overall doubt clinically significant. He has no other features to suggest hyperthyroidism. He seems to be  responding to Depakote. Continue treatment per psychiatry. 6. Status post aortic valve [facility] September 2015 not on anticoagulation. Continue aspirin, statin. 7. Status post pacemaker placement; battery replaced September 2015 and [facility] because the battery had run out and the patient failed to follow-up.  8. Prolonged QTc. Avoid antipsychotics.    Code Status: full code DVT prophylaxis: Lovenox Family Communication:  Disposition Plan: Psychiatric inpatient treatment  [name] IdeGagan S Carlo [name] [facility]  Pager [phone] If 7PM-7AM, please contact night-coverage at [email or web address], password [facility] 09/27/2014, 1:09 PM  LOS: 6 days   Consultants:  Psychiatry   Procedures:    Antibiotics:  Cefepime 11/28 >>11/29  HPI/Subjective: Patient seen and examined, breathing is stable.   Objective: Filed Vitals:   09/27/14 0657 09/27/14 0729 09/27/14 1106 09/27/14 1135  BP: 118/70     Pulse: 90     Temp: 97.9 F (36.6 C)     TempSrc: Oral     Resp: 20     Height:      Weight: 69.899 kg (154 lb 1.6 oz)     SpO2: 94% 95% 95% 98%    Intake/Output Summary (Last 24 hours) at 09/27/14 1309 Last data filed at 09/27/14 0827  Gross per 24 hour  Intake    600 ml  Output      0 ml  Net    600 ml     Filed Weights   09/24/14 0439 09/24/14 0540 09/27/14 0657  Weight: 70.489 kg (155 lb 6.4 oz) 70.489 kg (155 lb 6.4 oz) 69.899 kg (154 lb 1.6 oz)    Exam:    Physical Exam: Eyes: No icterus, extraocular muscles intact  Lungs: Normal respiratory effort, bilateral clear to auscultation, no crackles or wheezes.  Heart: Regular rate and rhythm, S1 and S2  normal, no murmurs, rubs auscultated Abdomen: BS normoactive,soft,nondistended,non-tender to palpation,no organomegaly Extremities: No pretibial edema, no erythema, no cyanosis, no clubbing Neuro : Alert and oriented to time, place and person,  No focal deficits   Data Reviewed: Free T4 within normal limits. T3 slightly low. The studies were just performed 17 days ago and were unremarkable.  Scheduled Meds: . albuterol  2.5 mg Nebulization QID  . antiseptic oral rinse  7 mL Mouth Rinse BID  . aspirin  81 mg Oral Daily  . atorvastatin  40 mg Oral q1800  . budesonide (PULMICORT) nebulizer solution  0.25 mg Nebulization BID  . cefUROXime  500 mg Oral BID WC  . divalproex  1,000 mg Oral QHS  . folic acid  1 mg Oral Daily  . furosemide  20 mg Oral Daily  . gabapentin  200 mg Oral TID  . hydrOXYzine  50 mg Oral QHS  . Influenza vac split quadrivalent PF  0.5 mL Intramuscular Tomorrow-1000  . multivitamin with minerals  1 tablet Oral Daily  . nicotine  14 mg Transdermal Daily  . pneumococcal 23 valent vaccine  0.5 mL Intramuscular Tomorrow-1000  . polyethylene glycol  17 g Oral BID  . sodium chloride  3 mL Intravenous Q12H  . sodium chloride  3 mL Intravenous Q12H  . tamsulosin  0.4 mg Oral QPC supper  . thiamine  100 mg Oral Daily   Or  . thiamine  100 mg Intravenous Daily   Continuous Infusions:   Principal Problem:   COPD exacerbation Active Problems:   Bipolar I disorder, single manic episode, severe, with psychosis   H/O aortic valve replacement   History of permanent cardiac pacemaker placement   SOB (shortness of breath)   HCAP (healthcare-associated pneumonia)   Acute diastolic CHF (congestive heart failure)   Manic behavior   Time spent 25 minutes  Summary: ER 11/5-11/9: IVC placed for psychosis at [facility]. Psychiatry recommended inpatient treatment. Patient with combativeness required Ativan, Haldol, Geodon, police presence and shackling to bed. [facility]  [facility]16 hospitalized for bipolar disorder, recurrent, severe, manic with psychotic features. Antipsychotics were avoided because of QT C elevation. Treated with Depakote ER 1000 mg QHS, Trazodone 50 mg QHS PRN sleep and Ativan 0.5 mg as  needed to address agitation and mania.  ER 11/18 presented with chest pain. Patient left. 11/22 complaint of shortness of breath, cough, ran out of inhaler. Discharged with prescription for inhaler. 11/22 second visit same day for albuterol neb Rx. Left AMA, verbally abusive. 11/24 SOB. Left AMA. Refused interventions. 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 Treated for COPD exacerbation

## 2014-09-27 NOTE — Progress Notes (Signed)
Pt refused his labs to be drawn this morning. Has been unwilling and uncooperative with his care the last shift. FYI.

## 2014-09-27 NOTE — Progress Notes (Signed)
Clinical Social Work Department CLINICAL SOCIAL WORK PSYCHIATRY SERVICE LINE ASSESSMENT 09/27/2014  Patient:  [name] [name]  Account:  [id number]  Admit Date:  09/21/2014  Clinical Social Worker:  Unk LightningHOLLY Emilliano Dilworth, LCSW  Date/Time:  09/27/2014 10:30 AM Referred by:  Physician  Date referred:  09/27/2014 Reason for Referral  Psychosocial assessment   Presenting Symptoms/Problems (In the person's/family's own words):   Psych consulted due to bipolar disorder and medication management.   Abuse/Neglect/Trauma History (check all that apply)  Denies history   Abuse/Neglect/Trauma Comments:   Psychiatric History (check all that apply)  Inpatient/hospitilization   Psychiatric medications:  Deapkote 1000 mg  Neurontin 200 mg  Vistaril 50 mg   Current Mental Health Hospitalizations/Previous Mental Health History:   Patient has been diagnosed with bipolar disorder. Patient denies any outpatient follow up and reports that he does not take any medication when he is at home.   Current provider:   None currently   Place and Date:   N/A   Current Medications:   Scheduled Meds:      . albuterol  2.5 mg Nebulization Q4H  . antiseptic oral rinse  7 mL Mouth Rinse BID  . aspirin  81 mg Oral Daily  . atorvastatin  40 mg Oral q1800  . budesonide (PULMICORT) nebulizer solution  0.25 mg Nebulization BID  . cefUROXime  500 mg Oral BID WC  . divalproex  1,000 mg Oral QHS  . folic acid  1 mg Oral Daily  . furosemide  20 mg Oral Daily  . gabapentin  200 mg Oral TID  . hydrOXYzine  50 mg Oral QHS  . Influenza vac split quadrivalent PF  0.5 mL Intramuscular Tomorrow-1000  . multivitamin with minerals  1 tablet Oral Daily  . nicotine  14 mg Transdermal Daily  . pneumococcal 23 valent vaccine  0.5 mL Intramuscular Tomorrow-1000  . polyethylene glycol  17 g Oral BID  . sodium chloride  3 mL Intravenous Q12H  . sodium chloride  3 mL Intravenous Q12H  . tamsulosin  0.4 mg Oral QPC supper  .  thiamine  100 mg Oral Daily   Or     . thiamine  100 mg Intravenous Daily        Continuous Infusions:      PRN Meds:.acetaminophen **OR** acetaminophen, albuterol, LORazepam       Previous Impatient Admission/Date/Reason:   [facility]   Emotional Health / Current Symptoms    Suicide/Self Harm  None reported   Suicide attempt in the past:   Patient denies any SI or HI.   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 Patient reports that he lives "everywhere and anywhere". Patient avoids answering most questions directly.    Attention/Behavioral Symptoms  Inattentive   Other Attention / Behavioral Symptoms:   Patient manic and easily distracted.    Cognitive Impairment  Orientation - Self   Other Cognitive Impairment:    Mood and Adjustment  Manic    Stress, Anxiety, Trauma, Any Recent Loss/Stressor  None reported   Anxiety (frequency):   N/A   Phobia (specify):   N/A   Compulsive behavior (specify):   N/A   Obsessive behavior (specify):   N/A   Other:   N/A   Substance Abuse/Use  None   SBIRT completed (please refer for detailed  history):  N  Self-reported substance use:   Patient denies any substance use.   Urinary Drug Screen Completed:  Y Alcohol level:   <11    Environmental/Housing/Living Arrangement  HOMELESS   Who is in the home:   Patient reports he lives wherever he wants to because he is a Community education officermillionaire.   Emergency contact:  Justin-son   Financial  Medicare   Patient's Strengths and Goals (patient's own words):   Patient reports he is independent.   Clinical Social Worker's Interpretive Summary:   CSW received referral to complete psychosocial assessment. CSW reviewed chart and staffed case with psych MD. [facility] but psych MD continuing to recommend inpatient psych placement for medication management and mood stabilization.    CSW spoke with patient outside of room. Patient delusional and manic. Patient talking about having lots of money and owning several houses. Patient reports he was married 3 times in the past and has 2 adult sons that live in [place]. Patient reports he owns a 2 million dollar house in [place] but is not planning on returning there. Patient tells stories of owning [place] acres [place] land in [place] and a farm in [place]. Patient reports he has been [place] under the stars and enjoys hiking and camping.    Patient is a poor historian and unable to provide any detailed information. Patient reports that he does not see a psychiatrist on an outpatient basis and takes no medication when outside of the hospital. Patient reports he does not need all of the medications prescribed here at the hospital because he is healthy and denies any MH diagnosis.    CSW will search for inpatient psych placement.   Disposition:  Inpatient referral made [facility]   [place]olly Arvie Villar[place], [place] [phone]

## 2014-09-27 NOTE — Consult Note (Signed)
Psychiatry Consult follow up note  Reason for Consult:  Medication management for bipolar disorder and disposition plans Referring Physician: Standley Brooking, MD  [name] [name] is an 68 y.o. male. Total Time spent with patient: 20 minutes  Assessment: AXIS I:  Bipolar disorder, most recent episode is mania AXIS II:  Deferred AXIS III:   Past Medical History  Diagnosis Date  . 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 COPD (chronic obstructive pulmonary disease)   . Thoracic ascending aortic aneurysm     repaired [facility]  . COPD (chronic obstructive pulmonary disease)   . BPH (benign prostatic hyperplasia)   . Pacemaker     battery replaced 06/2014 at [facility]  . SSS (sick sinus syndrome)     s/p PPM   . CAD (coronary artery disease)     ?MI in 2007  . Pneumothorax     traumatic 2007  . Anxiety and depression   . Psychosis   . Bipolar disorder    AXIS IV:  economic problems, other psychosocial or environmental problems and problems related to social environment AXIS V:  21-30 behavior considerably influenced by delusions or hallucinations OR serious impairment in judgment, communication OR inability to function in almost all areas  Plan:  Case discussed with [facility], [facility] from [facility] for possible psych bed Continue neurontin 200 mg PO TID for neuropathy and mood  Continue Hydroxyzine 50 mg PO Qhs for insomnia Continue Depakote ER 500 mg two tablet at bed time for mood swings Continue Ativan PRN for anxiety We will check valproic acid level and competency metabolic panel on Tuesday morning Refer to psych social service regarding psychiatric inpatient bed No evidence of imminent risk to self or others at present.   Supportive therapy provided about ongoing stressors.  Appreciate psychiatric consultation and follow up tomorrow Please contact [phone] or [phone] if needs further assistance   Subjective:   [name] [name]  is a 68 y.o. male patient admitted with grandiose delusions and bizarre behavior.  HPI: [name] [name] is an 68 y.o. male, Caucasian seen and chart reviewed for psychiatric consultation and evaluation of bipolar disorder and medication management. He was admitted to medical floor for medical stabilization of sob due to COPD and Pneumonia. Patient has been suffering with increased irritability, pressured speech, racing thoughts, grandiose delusions and inappropriate and bizarre behaviors. His antipsychotic medication seroquel was discontinued during his recent admission at [facility] due to cardiac structural abnormality and repair of valve in the recent past. He was treated with depakote which he was non compliant while in out patient care. Patient denied suicidal or homicidal ideations, intention or plans. He was recently admitted to [facility] (from 09/07/14-09/12/14) due to aggressiveness and delusional thoughts but has not been taking his prescribed medications or keep his follow up appointment. Patient has no insight into his problem and has been exercising poor judgment.   Interval history: Patient has been hypomanic with increased pressured speech, and loud talking but not irritable today. He has linear and goal directed and less tangentiality, circumstantiality, grandiose delusions and bizarre behaviors today. Patient stated that he does not required restraints and has not required security visit since yesterday. Patient has a poor to fair insight, judgment and impulse control.   Patient has been compliant with his medication management and is slowly responding. Patient is seeking medication for breathing and minimizes need of medication for  bipolar disorder. Patient reported [place] was [place] from [place] about a [place] ago since then living in multiple places, motels in [place], [place] and [place] and has limited contact with family members and friends. Patient stated he receives a  lot of money and he has more money to buy a land and build a house. Patient claims DSS social worker overreacting and contacting the police to bring him to the hospital. Patient reported he has been noncompliant with the medication Depakote as it is calming him down. Will ask psychiatric social service to contact his a DSS social worker for appropriate psychosocial history and disposition plans.  Medical history:  Patient with history of Bipolar disorder who [facility] by [employer] enforcement. He was petitioned for IVC by Sharlet SalinaSharon Neville with DSS [phone]. Per reports, patient has been extremely manic, has not been sleeping for days, having racing thoughts, mood lability, aggressive behavior, hostility and disorganized thought process.. He has been to emergency departments numerous times last week but always left against medical advice. Patient reportedly  had heart surgery recently and has COPD. He has  Not been using breathing equipment. 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   Past Psychiatric History: Past Medical History  Diagnosis Date  . Severe aortic valve stenosis     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  . COPD (chronic obstructive pulmonary disease)   . Thoracic ascending aortic aneurysm     [facility]  . COPD (chronic obstructive pulmonary disease)   . BPH (benign prostatic hyperplasia)   . Pacemaker     [facility]  . SSS (sick sinus syndrome)     s/p PPM   . CAD (coronary artery disease)     ?MI in 2007  . Pneumothorax     traumatic 2007  . Anxiety and depression   . Psychosis   . Bipolar disorder     reports that he has quit smoking. His smoking use included Cigarettes. He smoked 1.00 pack per day. He does not have any smokeless tobacco history on file. He reports that he drinks alcohol. He  reports that he uses illicit drugs (Marijuana). Family History  Problem Relation Age of Onset  . CAD Father   . Diabetes Father   . High blood pressure Brother   . Cancer Brother    Family History Substance Abuse:  (Unable to assess/Pt refused to participate) Family Supports: Yes, List: (Two sons. Athena MasseJustin Santiesteban is his POA.)) Living Arrangements: Alone Can pt return to current living arrangement?: No Abuse/Neglect [facility] Physical Abuse: Denies Verbal Abuse: Denies Sexual Abuse: Denies Allergies:   Allergies  Allergen Reactions  . Tiotropium Bromide Monohydrate Nausea Only and Anaphylaxis    Tremors    ACT Assessment Complete:  Yes:    Educational Status    Risk to Self: Risk to self with the past 6 months Suicidal Ideation: No (Unable to assess/Pt refused to participate) Suicidal Intent: No (Unable to assess/Pt refused to participate) Is patient at risk for suicide?: No (Unable to assess/Pt refused to participate) Suicidal Plan?: No (Unable to assess/Pt refused to participate) Access to Means: No (Unable to assess/Pt refused to participate) What has been your use of drugs/alcohol within the last 12 months?: Pt uses marijuana regularly Previous Attempts/Gestures: No (Unable to assess/Pt refused to participate) How many times?: 0 Other Self Harm  Risks: None identified (Unable to assess/Pt refused to participate) Triggers for Past Attempts: None known Intentional Self Injurious Behavior: None (Unable to assess/Pt refused to participate) Family Suicide History: Unable to assess Recent stressful life event(s): Other (Comment) (Unable to assess/Pt refused to participate) Persecutory voices/beliefs?: No (Unable to assess/Pt refused to participate) Depression: No (Unable to assess/Pt refused to participate) Depression Symptoms: Feeling angry/irritable Substance abuse history and/or treatment for substance abuse?: Yes Suicide prevention information given to non-admitted patients:  Not applicable  Risk to Others: Risk to Others within the past 6 months Homicidal Ideation: No (Unable to assess/Pt refused to participate) Thoughts of Harm to Others: No (Unable to assess/Pt refused to participate) Current Homicidal Intent: No (Unable to assess/Pt refused to participate) Current Homicidal Plan: No (Unable to assess/Pt refused to participate) Access to Homicidal Means: No (Unable to assess/Pt refused to participate) Identified Victim: Unable to assess/Pt refused to participate History of harm to others?: No (Unable to assess/Pt refused to participate) Assessment of Violence: None Noted (Unable to assess/Pt refused to participate) Violent Behavior Description: Unable to assess/Pt refused to participate Does patient have access to weapons?: No (Unable to assess/Pt refused to participate) Criminal Charges Pending?: No (Unable to assess/Pt refused to participate) Does patient have a court date: No (Unable to assess/Pt refused to participate)  Abuse: Abuse/Neglect Assessment (Assessment to be complete while patient is alone) Physical Abuse: Denies Verbal Abuse: Denies Sexual Abuse: Denies Exploitation of patient/patient's resources: Denies Self-Neglect: Denies  Prior Inpatient Therapy: Prior Inpatient Therapy Prior Inpatient Therapy: Yes Prior Therapy Dates: In the last month Prior Therapy Facilty/Provider(s): [facility] [place] Reason for Treatment: inpt mental health- psychosis  Prior Outpatient Therapy: Prior Outpatient Therapy Prior Outpatient Therapy: No Prior Therapy Dates: Unknown Prior Therapy Facilty/Provider(s): Unknown Reason for Treatment: Unknown  Additional Information: Additional Information 1:1 In Past 12 Months?: No CIRT Risk: No Elopement Risk: No Does patient have medical clearance?: Yes   Objective: Blood pressure 90/60, pulse 88, temperature 98 F (36.7 C), temperature source Oral, resp. rate 22, height 5\' 11"  (1.803 m), weight 69.899  kg (154 lb 1.6 oz), SpO2 98 %.Body mass index is 21.5 kg/(m^2). Results for orders placed or performed during the hospital encounter of 09/21/14 (from the past 72 hour(s))  T4, free     Status: None   Collection Time: 09/25/14  6:05 AM  Result Value Ref Range   Free T4 1.32 0.80 - 1.80 ng/dL    Comment: Performed at [facility]  T3     Status: Abnormal   Collection Time: 09/25/14  6:05 AM  Result Value Ref Range   T3, Total 69.2 (L) 80.0 - 204.0 ng/dl    Comment: Performed at [facility] are reviewed and are pertinent for the above.  Current Facility-Administered Medications  Medication Dose Route Frequency Provider Last Rate Last Dose  . acetaminophen (TYLENOL) tablet 650 mg  650 mg Oral Q6H PRN Eduard Clos, MD       Or  . acetaminophen (TYLENOL) suppository 650 mg  650 mg Rectal Q6H PRN Eduard Clos, MD      . albuterol (PROVENTIL) (2.5 MG/3ML) 0.083% nebulizer solution 2.5 mg  2.5 mg Nebulization Q2H PRN Eduard Clos, MD   2.5 mg at 09/27/14 1424  . albuterol (PROVENTIL) (2.5 MG/3ML) 0.083% nebulizer solution 2.5 mg  2.5 mg Nebulization QID [name], MD   2.5 mg at 09/27/14 1543  . antiseptic oral rinse (CPC / CETYLPYRIDINIUM  CHLORIDE 0.05%) solution 7 mL  7 mL Mouth Rinse BID Standley Brooking, MD   7 mL at 09/25/14 2300  . aspirin chewable tablet 81 mg  81 mg Oral Daily Enid Skeens, MD   81 mg at 09/27/14 1610  . atorvastatin (LIPITOR) tablet 40 mg  40 mg Oral q1800 Mojeed Akintayo   40 mg at 09/26/14 1703  . budesonide (PULMICORT) nebulizer solution 0.25 mg  0.25 mg Nebulization BID Eduard Clos, MD   0.25 mg at 09/27/14 0729  . cefUROXime (CEFTIN) tablet 500 mg  500 mg Oral BID WC Standley Brooking, MD   500 mg at 09/27/14 0743  . divalproex (DEPAKOTE ER) 24 hr tablet 1,000 mg  1,000 mg Oral QHS Nehemiah Settle, MD   1,000 mg at 09/25/14 2246  . folic acid (FOLVITE) tablet 1 mg  1 mg Oral Daily Eduard Clos, MD   1 mg at 09/27/14 9604  . furosemide (LASIX) tablet 20 mg  20 mg Oral Daily Standley Brooking, MD   20 mg at 09/27/14 0933  . gabapentin (NEURONTIN) capsule 200 mg  200 mg Oral TID Nehemiah Settle, MD   200 mg at 09/26/14 1702  . hydrOXYzine (ATARAX/VISTARIL) tablet 50 mg  50 mg Oral QHS Nehemiah Settle, MD   50 mg at 09/26/14 2200  . Influenza vac split quadrivalent PF (FLUARIX) injection 0.5 mL  0.5 mL Intramuscular Tomorrow-1000 Eduard Clos, MD   0.5 mL at 09/25/14 1000  . LORazepam (ATIVAN) injection 1 mg  1 mg Intramuscular Q6H PRN Standley Brooking, MD   1 mg at 09/26/14 0615  . multivitamin with minerals tablet 1 tablet  1 tablet Oral Daily Eduard Clos, MD   1 tablet at 09/27/14 [phone]  . nicotine (NICODERM CQ - dosed in mg/24 hours) patch 14 mg  14 mg Transdermal Daily Standley Brooking, MD   14 mg at 09/26/14 1102  . pneumococcal 23 valent vaccine (PNU-IMMUNE) injection 0.5 mL  0.5 mL Intramuscular Tomorrow-1000 Eduard Clos, MD   0.5 mL at 09/25/14 1000  . polyethylene glycol (MIRALAX / GLYCOLAX) packet 17 g  17 g Oral BID Standley Brooking, MD   17 g at 09/27/14 0933  . sodium chloride 0.9 % injection 3 mL  3 mL Intravenous Q12H Eduard Clos, MD   3 mL at 09/24/14 1000  . sodium chloride 0.9 % injection 3 mL  3 mL Intravenous Q12H Eduard Clos, MD   3 mL at 09/24/14 2136  . tamsulosin (FLOMAX) capsule 0.4 mg  0.4 mg Oral QPC supper Mojeed Akintayo   0.4 mg at 09/26/14 1703  . thiamine (VITAMIN B-1) tablet 100 mg  100 mg Oral Daily Eduard Clos, MD   100 mg at 09/27/14 8119   Or  . thiamine (B-1) injection 100 mg  100 mg Intravenous Daily Eduard Clos, MD        Psychiatric Specialty Exam:     Blood pressure 90/60, pulse 88, temperature 98 F (36.7 C), temperature source Oral, resp. rate 22, height 5\' 11"  (1.803 m), weight 69.899 kg (154 lb 1.6 oz), SpO2 98 %.Body mass index is 21.5 kg/(m^2).   General Appearance: Disheveled  Eye Contact::  Minimal  Speech:  Garbled  Volume:  Increased  Mood:  Angry and Irritable  Affect:  Labile  Thought Process:  Circumstantial and Disorganized  Orientation:  Full (Time, Place, and Person)  Thought  Content:  Delusions and grandiose  Suicidal Thoughts:  No  Homicidal Thoughts:  No  Memory:  Immediate;   Fair Recent;   Fair Remote;   Fair  Judgement:  Impaired  Insight:  Lacking  Psychomotor Activity:  Increased  Concentration:  Poor  Recall:  Fair  Fund of Knowledge:Fair  Language: Good  Akathisia:  No  Handed:  Right  AIMS (if indicated):     Assets:  Communication Skills  Sleep:   poor   Musculoskeletal: Strength & Muscle Tone: within normal limits Gait & Station: normal Patient leans: N/A  Treatment Plan Summary: Daily contact with patient to assess and evaluate symptoms and progress in treatment Medication management  Continue Depakote ER 500 mg ii PO Qhs for bipolar mania  Check valproic acid level and comprehensive metabolic panel on morning for therapeutic values and appropriate adjustment if needed Continue neurontin 200 mg PO TID for neuropathy and mood and Hydroxyzine 50 mg PO Qhs for insomnia  Krystn Dermody,JANARDHAHA R., MD 09/27/2014 4:25 PM

## 2014-09-28 LAB — COMPREHENSIVE METABOLIC PANEL
ALT: 27 U/L (ref 0–53)
AST: 22 U/L (ref 0–37)
Albumin: 3.1 g/dL — ABNORMAL LOW (ref 3.5–5.2)
Alkaline Phosphatase: 87 U/L (ref 39–117)
Anion gap: 9 (ref 5–15)
BUN: 18 mg/dL (ref 6–23)
CO2: 34 mEq/L — ABNORMAL HIGH (ref 19–32)
Calcium: 9.4 mg/dL (ref 8.4–10.5)
Chloride: 102 mEq/L (ref 96–112)
Creatinine, Ser: 0.87 mg/dL (ref 0.50–1.35)
GFR calc Af Amer: >90 mL/min (ref >90)
GFR calc non Af Amer: 87 mL/min — ABNORMAL LOW (ref >90)
Glucose, Bld: 93 mg/dL (ref 70–99)
Potassium: 5 mEq/L (ref 3.7–5.3)
Sodium: 145 mEq/L (ref 137–147)
Total Bilirubin: <0.2 mg/dL — ABNORMAL LOW (ref 0.3–1.2)
Total Protein: 6.9 g/dL (ref 6.0–8.3)

## 2014-09-28 LAB — VALPROIC ACID LEVEL: Valproic Acid Lvl: <10 ug/mL — ABNORMAL LOW (ref 50.0–100.0)

## 2014-09-28 MED ORDER — LORAZEPAM 0.5 MG PO TABS
0.5000 mg | ORAL_TABLET | Freq: Three times a day (TID) | ORAL | Status: DC | PRN
  Administered 2014-09-28 – 2014-10-04 (×6): 0.5 mg via ORAL
  Filled 2014-09-28 (×8): qty 1

## 2014-09-28 MED ORDER — LORAZEPAM 2 MG/ML IJ SOLN: 0.5000 mg | Freq: Three times a day (TID) | INTRAMUSCULAR | Status: DC | PRN

## 2014-09-28 MED ORDER — GABAPENTIN 400 MG PO CAPS
400.0000 mg | ORAL_CAPSULE | Freq: Three times a day (TID) | ORAL | Status: DC
  Administered 2014-09-28 – 2014-10-04 (×19): 400 mg via ORAL
  Filled 2014-09-28 (×20): qty 1

## 2014-09-28 MED ORDER — LORAZEPAM 0.5 MG PO TABS: 0.5000 mg | ORAL_TABLET | Freq: Once | ORAL | Status: DC

## 2014-09-28 NOTE — Progress Notes (Signed)
Clinical Social Work  CSW continues to search for placement for patient:  [name] [name]- spoke with admissions who reports patient is being reviewed and will call back with determination.  [facility]  [facility]  [facility]  [facility]- no available beds  [name]- denied on 12/1  [facility]- no available beds today but anticipated DC tomorrow. Admissions encouraged CSW to fax referral in so it could be reviewed for possible admission tomorrow. Referral faxed.  [facility] #[id number]  Valid 09/28/14-10/04/14. Referral faxed and phone interview completed.  [name]- L/M with admissions to determine if referral has been reviewed.  [place]- per admissions, referral has not been reviewed at this time  [name]- denied on 12/2 due to acuity  [facility]- no available beds  [facility]- admissions reports waiting list but stated that CSW could send referral. Referral faxed.  [facility]- [facility]  [facility]- no available beds  [facility]- per admissions, patient has not been reviewed yet but they will call once they have a determination  [facility]- denied on 12/1 due to dementia  [facility]- denied on 12/2 due to acuity  [facility]- no available beds  [facility]- no available beds  [facility]- available beds. Referral faxed.  [place]- anticipated DC this afternoon. Referral faxed.  CSW will continue to follow.  [facility] AFBHolly Zeyna Mkrtchyan, [place]CSW [phone]

## 2014-09-28 NOTE — Consult Note (Signed)
Psychiatry Consult follow up note  Reason for Consult:  Medication management for bipolar disorder and disposition plans Referring Physician: Samuella Cota, MD  [name] [name] is an 68 y.o. male. Total Time spent with patient: 20 minutes  Assessment: AXIS I:  Bipolar disorder, most recent episode is mania AXIS II:  Deferred AXIS III:   Past Medical History  Diagnosis Date  . 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 COPD (chronic obstructive pulmonary disease)   . Thoracic ascending aortic aneurysm     repaired 06/2014 at [facility]  . COPD (chronic obstructive pulmonary disease)   . BPH (benign prostatic hyperplasia)   . Pacemaker     battery replaced 06/2014 at [facility]  . SSS (sick sinus syndrome)     s/p PPM   . CAD (coronary artery disease)     ?MI in 2007  . Pneumothorax     traumatic 2007  . Anxiety and depression   . Psychosis   . Bipolar disorder    AXIS IV:  economic problems, other psychosocial or environmental problems and problems related to social environment AXIS V:  21-30 behavior considerably influenced by delusions or hallucinations OR serious impairment in judgment, communication OR inability to function in almost all areas  Plan:  Case discussed with Wells Guiles, [facility] from [facility] for possible psych bed Increase neurontin 400 mg PO TID for neuropathy and mood  Continue Hydroxyzine 50 mg PO Qhs for insomnia Recommended to be compliant with his mood stabilizer. Continue Depakote ER 500 mg two tablet at bed time for mood swings Continue Ativan PRN for anxiety We will check valproic acid level and competency metabolic panel on Tuesday morning Refer to psych social service regarding psychiatric inpatient bed No evidence of imminent risk to self or others at present.   Supportive therapy provided about ongoing stressors.  Appreciate psychiatric consultation and follow up tomorrow Please contact [phone] or [phone] if needs  further assistance   Subjective:   [name] [name] is a 68 y.o. male patient admitted with grandiose delusions and bizarre behavior.  HPI: [name] [name] is an 68 y.o. male, Caucasian seen and chart reviewed for psychiatric consultation and evaluation of bipolar disorder and medication management. He was admitted to medical floor for medical stabilization of sob due to COPD and Pneumonia. Patient has been suffering with increased irritability, pressured speech, racing thoughts, grandiose delusions and inappropriate and bizarre behaviors. His antipsychotic medication seroquel was discontinued during his recent admission at [facility] due to cardiac structural abnormality and repair of valve in the recent past. He was treated with depakote which he was non compliant while in out patient care. Patient denied suicidal or homicidal ideations, intention or plans. He was recently admitted to [facility] (from 09/07/14-09/12/14) due to aggressiveness and delusional thoughts but has not been taking his prescribed medications or keep his follow up appointment. Patient has no insight into his problem and has been exercising poor judgment.   Interval history: Patient has been noncompliant are partially compliant with his mood stabilizer Depakote. Patient continued to report has been feeding excellent and wonderful and continued to be hypomanic with increased pressured speech, and loud talking but not irritable today. He has linear and goal directed and less tangentiality, circumstantiality, grandiose delusions and bizarre behaviors. Patient has a poor to fair insight, judgment and impulse control. Reportedly patient has been stable medically with his breathing treatments.  Patient  reported he was relocated from [place] about a year ago since then living in multiple places, motels in [place], [place] and [place] and has limited contact with family members and friends. Patient stated he receives a  lot of money and he has more money to buy a land and build a house. Patient claims DSS social worker overreacting and contacting the police to bring him to the hospital. Patient reported he has been noncompliant with the medication Depakote as it is calming him down. Will ask psychiatric social service to contact his a DSS social worker for appropriate psychosocial history and disposition plans.  Medical history:  Patient with history of Bipolar disorder who presents to [facility] ED by Event organiser. He was petitioned for IVC by Charolette Forward with DSS [phone]. Per reports, patient has been extremely manic, has not been sleeping for days, having racing thoughts, mood lability, aggressive behavior, hostility and disorganized thought process.. He has been to emergency departments numerous times last week but always left against medical advice. Patient reportedly  had heart surgery recently and has COPD. He has  Not been using breathing equipment. 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 He also reports that he is working for [employer] and buying illegal drugs.   Past Psychiatric History: Past Medical History  Diagnosis Date  . Severe aortic valve stenosis     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 at [facility]  . COPD (chronic obstructive pulmonary disease)   . Thoracic ascending aortic aneurysm     repaired 06/2014 at [facility]  . COPD (chronic obstructive pulmonary disease)   . BPH (benign prostatic hyperplasia)   . Pacemaker     battery replaced 06/2014 at [facility]  . SSS (sick sinus syndrome)     s/p PPM   . CAD (coronary artery disease)     ?MI in 2007  . Pneumothorax     traumatic 2007  . Anxiety and depression   . Psychosis   . Bipolar disorder     reports that he has quit smoking. His smoking use included Cigarettes. He smoked 1.00 pack per day. He does not have any smokeless tobacco history on file. He reports that he drinks alcohol. He  reports that he uses illicit drugs (Marijuana). Family History  Problem Relation Age of Onset  . CAD Father   . Diabetes Father   . High blood pressure Brother   . Cancer Brother    Family History Substance Abuse:  (Unable to assess/Pt refused to participate) Family Supports: Yes, List: (Two sons. [name] [name] is his POA.)) Living Arrangements: Alone Can pt return to current living arrangement?: No Abuse/Neglect [facility]) Physical Abuse: Denies Verbal Abuse: Denies Sexual Abuse: Denies Allergies:   Allergies  Allergen Reactions  . Tiotropium Bromide Monohydrate Nausea Only and Anaphylaxis    Tremors    ACT Assessment Complete:  Yes:    Educational Status    Risk to Self: Risk to self with the past 6 months Suicidal Ideation: No (Unable to assess/Pt refused to participate) Suicidal Intent: No (Unable to assess/Pt refused to participate) Is patient at risk for suicide?: No (Unable to assess/Pt refused to participate) Suicidal Plan?: No (Unable to assess/Pt refused to participate) Access to Means: No (Unable to assess/Pt refused to participate) What has been your use of drugs/alcohol within the last 12 months?: Pt uses marijuana regularly Previous Attempts/Gestures: No (Unable to assess/Pt refused to participate) How many times?: 0 Other Self Harm Risks: None identified (  Unable to assess/Pt refused to participate) Triggers for Past Attempts: None known Intentional Self Injurious Behavior: None (Unable to assess/Pt refused to participate) Family Suicide History: Unable to assess Recent stressful life event(s): Other (Comment) (Unable to assess/Pt refused to participate) Persecutory voices/beliefs?: No (Unable to assess/Pt refused to participate) Depression: No (Unable to assess/Pt refused to participate) Depression Symptoms: Feeling angry/irritable Substance abuse history and/or treatment for substance abuse?: Yes Suicide prevention information given to non-admitted patients:  Not applicable  Risk to Others: Risk to Others within the past 6 months Homicidal Ideation: No (Unable to assess/Pt refused to participate) Thoughts of Harm to Others: No (Unable to assess/Pt refused to participate) Current Homicidal Intent: No (Unable to assess/Pt refused to participate) Current Homicidal Plan: No (Unable to assess/Pt refused to participate) Access to Homicidal Means: No (Unable to assess/Pt refused to participate) Identified Victim: Unable to assess/Pt refused to participate History of harm to others?: No (Unable to assess/Pt refused to participate) Assessment of Violence: None Noted (Unable to assess/Pt refused to participate) Violent Behavior Description: Unable to assess/Pt refused to participate Does patient have access to weapons?: No (Unable to assess/Pt refused to participate) Criminal Charges Pending?: No (Unable to assess/Pt refused to participate) Does patient have a court date: No (Unable to assess/Pt refused to participate)  Abuse: Abuse/Neglect Assessment (Assessment to be complete while patient is alone) Physical Abuse: Denies Verbal Abuse: Denies Sexual Abuse: Denies Exploitation of patient/patient's resources: Denies Self-Neglect: Denies  Prior Inpatient Therapy: Prior Inpatient Therapy Prior Inpatient Therapy: Yes Prior Therapy Dates: In the last month Prior Therapy Facilty/Provider(s): [facility] in [place] Reason for Treatment: inpt mental health- psychosis  Prior Outpatient Therapy: Prior Outpatient Therapy Prior Outpatient Therapy: No Prior Therapy Dates: Unknown Prior Therapy Facilty/Provider(s): Unknown Reason for Treatment: Unknown  Additional Information: Additional Information 1:1 In Past 12 Months?: No CIRT Risk: No Elopement Risk: No Does patient have medical clearance?: Yes   Objective: Blood pressure 103/68, pulse 82, temperature 97.7 F (36.5 C), temperature source Oral, resp. rate 18, height _0  (1.803 m), weight  70.217 kg (154 lb 12.8 oz), SpO2 98 %.Body mass index is 21.6 kg/(m^2). Results for orders placed or performed during the hospital encounter of 09/21/14 (from the past 72 hour(s))  Valproic acid level     Status: Abnormal   Collection Time: 09/28/14  5:07 AM  Result Value Ref Range   Valproic Acid Lvl <10.0 (L) 50.0 - 100.0 ug/mL    Comment: Performed at [facility]  Comprehensive metabolic panel     Status: Abnormal   Collection Time: 09/28/14  5:07 AM  Result Value Ref Range   Sodium 145 137 - 147 mEq/L   Potassium 5.0 3.7 - 5.3 mEq/L   Chloride 102 96 - 112 mEq/L   CO2 34 (H) 19 - 32 mEq/L   Glucose, Bld 93 70 - 99 mg/dL   BUN 18 6 - 23 mg/dL   Creatinine, Ser 0.87 0.50 - 1.35 mg/dL   Calcium 9.4 8.4 - 10.5 mg/dL   Total Protein 6.9 6.0 - 8.3 g/dL   Albumin 3.1 (L) 3.5 - 5.2 g/dL   AST 22 0 - 37 U/L   ALT 27 0 - 53 U/L   Alkaline Phosphatase 87 39 - 117 U/L   Total Bilirubin <0.2 (L) 0.3 - 1.2 mg/dL   GFR calc non Af Amer 87 (L) >90 mL/min   GFR calc Af Amer >90 >90 mL/min    Comment: (NOTE) The eGFR has been calculated  using the CKD EPI equation. This calculation has not been validated in all clinical situations. eGFR's persistently <90 mL/min signify possible Chronic Kidney Disease.    Anion gap 9 5 - 15   Labs are reviewed and are pertinent for the above.  Current Facility-Administered Medications  Medication Dose Route Frequency Provider Last Rate Last Dose  . acetaminophen (TYLENOL) tablet 650 mg  650 mg Oral Q6H PRN Rise Patience, MD       Or  . acetaminophen (TYLENOL) suppository 650 mg  650 mg Rectal Q6H PRN Rise Patience, MD      . albuterol (PROVENTIL) (2.5 MG/3ML) 0.083% nebulizer solution 2.5 mg  2.5 mg Nebulization Q2H PRN Rise Patience, MD   2.5 mg at 09/28/14 3009  . albuterol (PROVENTIL) (2.5 MG/3ML) 0.083% nebulizer solution 2.5 mg  2.5 mg Nebulization QID Oswald Hillock, MD   2.5 mg at 09/28/14 1242  . antiseptic oral rinse (CPC  / CETYLPYRIDINIUM CHLORIDE 0.05%) solution 7 mL  7 mL Mouth Rinse BID Samuella Cota, MD   7 mL at 09/27/14 2200  . aspirin chewable tablet 81 mg  81 mg Oral Daily Mariea Clonts, MD   81 mg at 09/28/14 1126  . atorvastatin (LIPITOR) tablet 40 mg  40 mg Oral q1800 Mojeed Akintayo   40 mg at 09/26/14 1703  . budesonide (PULMICORT) nebulizer solution 0.25 mg  0.25 mg Nebulization BID Rise Patience, MD   0.25 mg at 09/28/14 0848  . cefUROXime (CEFTIN) tablet 500 mg  500 mg Oral BID WC Samuella Cota, MD   500 mg at 09/28/14 0820  . divalproex (DEPAKOTE ER) 24 hr tablet 1,000 mg  1,000 mg Oral QHS Durward Parcel, MD   1,000 mg at 09/25/14 2246  . folic acid (FOLVITE) tablet 1 mg  1 mg Oral Daily Rise Patience, MD   1 mg at 09/28/14 1126  . furosemide (LASIX) tablet 20 mg  20 mg Oral Daily Samuella Cota, MD   20 mg at 09/28/14 1126  . gabapentin (NEURONTIN) capsule 200 mg  200 mg Oral TID Durward Parcel, MD   200 mg at 09/28/14 1126  . hydrOXYzine (ATARAX/VISTARIL) tablet 50 mg  50 mg Oral QHS Durward Parcel, MD   50 mg at 09/26/14 2200  . Influenza vac split quadrivalent PF (FLUARIX) injection 0.5 mL  0.5 mL Intramuscular Tomorrow-1000 Rise Patience, MD   0.5 mL at 09/25/14 1000  . LORazepam (ATIVAN) tablet 0.5 mg  0.5 mg Oral TID PRN Gardiner Barefoot, NP       Or  . LORazepam (ATIVAN) injection 0.5 mg  0.5 mg Intramuscular TID PRN Gardiner Barefoot, NP      . LORazepam (ATIVAN) tablet 0.5 mg  0.5 mg Oral Once Gardiner Barefoot, NP   0.5 mg at 09/28/14 0330  . multivitamin with minerals tablet 1 tablet  1 tablet Oral Daily Rise Patience, MD   1 tablet at 09/27/14 [phone]  . nicotine (NICODERM CQ - dosed in mg/24 hours) patch 14 mg  14 mg Transdermal Daily Samuella Cota, MD   14 mg at 09/26/14 1102  . pneumococcal 23 valent vaccine (PNU-IMMUNE) injection 0.5 mL  0.5 mL Intramuscular Tomorrow-1000 Rise Patience, MD   0.5  mL at 09/25/14 1000  . polyethylene glycol (MIRALAX / GLYCOLAX) packet 17 g  17 g Oral BID Samuella Cota, MD   17 g at 09/27/14  2135  . sodium chloride 0.9 % injection 3 mL  3 mL Intravenous Q12H Rise Patience, MD   3 mL at 09/24/14 1000  . sodium chloride 0.9 % injection 3 mL  3 mL Intravenous Q12H Rise Patience, MD   3 mL at 09/24/14 2136  . tamsulosin (FLOMAX) capsule 0.4 mg  0.4 mg Oral QPC supper Mojeed Akintayo   0.4 mg at 09/27/14 1817  . thiamine (VITAMIN B-1) tablet 100 mg  100 mg Oral Daily Rise Patience, MD   100 mg at 09/28/14 1126   Or  . thiamine (B-1) injection 100 mg  100 mg Intravenous Daily Rise Patience, MD        Psychiatric Specialty Exam:     Blood pressure 103/68, pulse 82, temperature 97.7 F (36.5 C), temperature source Oral, resp. rate 18, height _0  (1.803 m), weight 70.217 kg (154 lb 12.8 oz), SpO2 98 %.Body mass index is 21.6 kg/(m^2).  General Appearance: Disheveled  Eye Contact::  Minimal  Speech:  Garbled  Volume:  Increased  Mood:  Angry and Irritable  Affect:  Labile  Thought Process:  Circumstantial and Disorganized  Orientation:  Full (Time, Place, and Person)  Thought Content:  Delusions and grandiose  Suicidal Thoughts:  No  Homicidal Thoughts:  No  Memory:  Immediate;   Fair Recent;   Fair Remote;   Fair  Judgement:  Impaired  Insight:  Lacking  Psychomotor Activity:  Increased  Concentration:  Poor  Recall:  [place]: Good  Akathisia:  No  Handed:  Right  AIMS (if indicated):     Assets:  Communication Skills  Sleep:   poor   Musculoskeletal: Strength & Muscle Tone: within normal limits Gait & Station: normal Patient leans: N/A  Treatment Plan Summary: Daily contact with patient to assess and evaluate symptoms and progress in treatment Medication management  Anxious to be compliant with his medication management Continue Depakote ER 500 mg ii PO Qhs for bipolar mania   Check valproic acid level and comprehensive metabolic panel on morning for therapeutic values and appropriate adjustment if needed Increase neurontin 400 mg PO TID for neuropathy and mood and Hydroxyzine 50 mg PO Qhs for insomnia  [name] [name],JANARDHAHA R., MD 09/28/2014 3:35 PM

## 2014-09-28 NOTE — Progress Notes (Addendum)
PROGRESS NOTE  [name] [name] ZOX:[id number] DOB: 06/13/1946 DOA: 09/21/2014 PCP: No PCP Per Patient  Summary: 68 year old man with recurrent severe manic and psychotic features presented to the ER on 11/25 for the seventh time in the last month. He presented with IVC papers. He was noted to be delusional at hotel, displayed belligerent behavior in ED, requiring IM injection, police restraint and shackling to the bed. [facility]. 11/27 Psychiatrist recommended inpatient psych treatment. He was extremely uncooperative and transferred to the emergency department for shortness of breath. There he was found to have oxygen saturation 89% on room air with diffuse wheezes. He was admitted for COPD exacerbation, possible mild CHF, possible pneumonia. His condition rapidly improved with Lasix, antibiotics and breathing treatments. He is medically clear for discharge from medical Hospital to behavioral health center.  Assessment/Plan: 1. Bipolar disorder with recurrent severe manic, psychotic features.Management per psychiatry. 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 No focal deficits noted. CT head negative 06/2014 at [facility] (no MRI/pacemaker). Continue Depakote, Neurontin. Patient continues to demonstrate manic symptoms but no psychosis or agitation and has been cooperative. As per nursing, patient has been refusing intermittent doses of Depakote. Psychiatry follow-up appreciated-patient has agreed to take medications as ordered. Neurontin being increased for neuropathy and mood and hydroxyzine added for insomnia. 2. Mild COPD exacerbation, resolved. Avoid steroids at this point given clinical improvement. 3. Mild acute diastolic heart failure. Resolved. Continue low-dose Lasix. No Echo in system- will check. 4. Possible pneumonia. Clinically resolved. Afebrile. Complete a week's course of oral antibiotics. 5. Abnormal  TSH. Significance unclear. TSH 0.357 November 12, 0.053 November 28. Free T4, T3 total within normal limits November 12. Repeat T4 normal, T3 slightly low. Overall doubt clinically significant. He has no other features to suggest hyperthyroidism. He seems to be responding to Depakote. Continue treatment per psychiatry. Recommend follow-up of full TFTs in 4-6 weeks. 6. Status post aortic [facility] not on anticoagulation. Continue aspirin, statin. 7. Status post pacemaker placement; battery replaced September 2015 and [facility] heart block because the battery had run out and the patient failed to follow-up.  8. Prolonged QTc. Avoid antipsychotics. Last QTC was 475 ms on 11/28    Code Status: full code DVT prophylaxis: Lovenox Family Communication: None at bedside Disposition Plan: ? Transfer to behavioral health center pending psychiatry clearance.   Consultants:  Psychiatry   Procedures:  None  Antibiotics:  Cefepime 11/28 >>11/29  Cefuroxime >  HPI/Subjective: Patient denies complaints. Denies dyspnea, chest pain or cough. Constantly talking to staff and states that he only has "mild bipolar disorder" and does not understand why he is still in the hospital. Denies suicidal or homicidal ideations. Denies delusions or hallucinations.  Objective: Filed Vitals:   09/28/14 0442 09/28/14 0645 09/28/14 0848 09/28/14 1320  BP: 114/53   103/68  Pulse: 85   82  Temp: 97.8 F (36.6 C)   97.7 F (36.5 C)  TempSrc: Oral   Oral  Resp: 20   18  Height:      Weight: 70.217 kg (154 lb 12.8 oz)     SpO2: 95% 97% 97% 98%    Intake/Output Summary (Last 24 hours) at 09/28/14 1639 Last data filed at 09/28/14 1321  Gross per 24 hour  Intake   2040 ml  Output      0 ml  Net   2040 ml     Filed Weights   09/24/14 0540 09/27/14 0657 09/28/14 0442  Weight: 70.489 kg (155 lb 6.4 oz)  69.899 kg (154 lb 1.6 oz) 70.217 kg (154 lb 12.8 oz)    Exam:    Physical Exam: Gen. exam: Pleasant middle-aged male ambulating comfortably in the halls. Lungs: Normal respiratory effort, bilateral clear to auscultation, no crackles or wheezes.  Heart: Regular rate and rhythm, S1 and S2 normal, no murmurs, rubs auscultated. Non-telemetry. Abdomen: BS normoactive,soft,nondistended,non-tender to palpation,no organomegaly Extremities: No pretibial edema, no erythema, no cyanosis, no clubbing Neuro : Alert and oriented to time, place and person, No focal deficits   Data Reviewed: Free T4 within normal limits. T3 slightly low. The studies were just performed 17 days ago and were unremarkable.  Scheduled Meds: . albuterol  2.5 mg Nebulization QID  . antiseptic oral rinse  7 mL Mouth Rinse BID  . aspirin  81 mg Oral Daily  . atorvastatin  40 mg Oral q1800  . budesonide (PULMICORT) nebulizer solution  0.25 mg Nebulization BID  . cefUROXime  500 mg Oral BID WC  . divalproex  1,000 mg Oral QHS  . folic acid  1 mg Oral Daily  . furosemide  20 mg Oral Daily  . gabapentin  400 mg Oral TID  . hydrOXYzine  50 mg Oral QHS  . Influenza vac split quadrivalent PF  0.5 mL Intramuscular Tomorrow-1000  . LORazepam  0.5 mg Oral Once  . multivitamin with minerals  1 tablet Oral Daily  . nicotine  14 mg Transdermal Daily  . pneumococcal 23 valent vaccine  0.5 mL Intramuscular Tomorrow-1000  . polyethylene glycol  17 g Oral BID  . sodium chloride  3 mL Intravenous Q12H  . sodium chloride  3 mL Intravenous Q12H  . tamsulosin  0.4 mg Oral QPC supper  . thiamine  100 mg Oral Daily   Or  . thiamine  100 mg Intravenous Daily   Continuous Infusions:   Principal Problem:   COPD exacerbation Active Problems:   Bipolar I disorder, single manic episode, severe, with psychosis   H/O aortic valve replacement   History of permanent cardiac pacemaker placement   SOB (shortness of breath)   HCAP  (healthcare-associated pneumonia)   Acute diastolic CHF (congestive heart failure)   Manic behavior   Time spent 25 minutes  Summary: ER 11/5-11/9: IVC placed for psychosis at [facility]. Psychiatry recommended inpatient treatment. Patient with combativeness required Ativan, Haldol, Geodon, police presence and shackling to bed. [facility] hospitalized for bipolar disorder, recurrent, severe, manic with psychotic features. Antipsychotics were avoided because of QT C elevation. Treated with Depakote ER 1000 mg QHS, Trazodone 50 mg QHS PRN sleep and Ativan 0.5 mg as needed to address agitation and mania.  ER 11/18 presented with chest pain. Patient left. 11/22 complaint of shortness of breath, cough, ran out of inhaler. Discharged with prescription for inhaler. 11/22 second visit same day for albuterol neb Rx. Left AMA, verbally abusive. 11/24 SOB. Left AMA. Refused interventions. 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 Treated for COPD exacerbation   HONGALGI,ANAND, MD, FACP, FHM. [facility] Pager [phone]  If 7PM-7AM, please contact night-coverage [email or web address] Password [email or web address] 09/28/2014, 4:47 PM

## 2014-09-28 NOTE — Progress Notes (Signed)
Valproic acid level is low, but pt has refused his Depakote medication on 09/26/14 and 09/27/14, [facility].

## 2014-09-29 NOTE — Care Management Note (Signed)
    Page 1 of 1   09/29/2014     5:41:44 PM CARE MANAGEMENT NOTE 09/29/2014  Patient:  [name] [name],[name] [name]   Account Number:  [id number]  Date Initiated:  09/26/2014  Documentation initiated by:  Trinna BalloonMcGIBBONEY,COOKIE MYRAETTE  Subjective/Objective Assessment:   PT ADMITTED WITH CCO COPD     Action/Plan:   FROM HOME   Anticipated DC Date:  09/30/2014   Anticipated DC Plan:  PSYCHIATRIC HOSPITAL  In-house referral  Clinical Social Worker      DC Planning Services  CM consult      Choice offered to / List presented to:             Status of service:  In process, will continue to follow Medicare Important Message given?  NO (If response is "NO", the following Medicare IM given date fields will be blank) Date Medicare IM given:  09/26/2014 Medicare IM given by:  Trinna BalloonMcGIBBONEY,COOKIE MYRAETTE Date Additional Medicare IM given:   Additional Medicare IM given by:    Discharge Disposition:    Per UR Regulation:  Reviewed for med. necessity/level of care/duration of stay  If discussed at Long Length of Stay Meetings, dates discussed:   09/29/2014    Comments:  09/29/14 [name] Stenglein RN,BSN NCM [phone] AWAITING INPT PSYCH FACILITY.  09/26/14 MMCGIBBONEY, RN, BSN PT IS CONFUSED COMBATIVE, SECURITY  AND [place] POLICE WITH PT. NO IM GIVEN.

## 2014-09-29 NOTE — Consult Note (Signed)
Psychiatry Consult follow up note  Reason for Consult:  Medication management for bipolar disorder and disposition plans Referring Physician: Samuella Cota, MD  [name] [name] is an 68 y.o. male. Total Time spent with patient: 20 minutes  Assessment: AXIS I:  Bipolar disorder, most recent episode is mania AXIS II:  Deferred AXIS III:   Past Medical History  Diagnosis Date  . 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 COPD (chronic obstructive pulmonary disease)   . Thoracic ascending aortic aneurysm     repaired 06/2014 at [facility]  . COPD (chronic obstructive pulmonary disease)   . BPH (benign prostatic hyperplasia)   . Pacemaker     battery replaced 06/2014 at [facility]  . SSS (sick sinus syndrome)     s/p PPM   . CAD (coronary artery disease)     ?MI in 2007  . Pneumothorax     traumatic 2007  . Anxiety and depression   . Psychosis   . Bipolar disorder    AXIS IV:  economic problems, other psychosocial or environmental problems and problems related to social environment AXIS V:  21-30 behavior considerably influenced by delusions or hallucinations OR serious impairment in judgment, communication OR inability to function in almost all areas  Plan:  Case discussed with Radene Ou, LCSW Continue neurontin 400 mg PO TID for neuropathy and mood  Continue Hydroxyzine 50 mg PO Qhs for insomnia Continue Depakote ER 500 mg two tablet at bed time for mood swings Continue Ativan PRN for anxiety We will check valproic acid level and competency metabolic panel on Friday morning No evidence of imminent risk to self or others at present.   Supportive therapy provided about ongoing stressors.  Appreciate psychiatric consultation and follow up tomorrow Please contact [phone] or [phone] if needs further assistance   Subjective:   [name] [name] is a 68 y.o. male patient admitted with grandiose delusions and bizarre behavior.  HPI: [name] [name] is an 68 y.o. male, Caucasian seen and chart reviewed for psychiatric consultation and evaluation of bipolar disorder and medication management. He was admitted to medical floor for medical stabilization of sob due to COPD and Pneumonia. Patient has been suffering with increased irritability, pressured speech, racing thoughts, grandiose delusions and inappropriate and bizarre behaviors. His antipsychotic medication seroquel was discontinued during his recent admission at [facility] due to cardiac structural abnormality and repair of valve in the recent past. He was treated with depakote which he was non compliant while in out patient care. Patient denied suicidal or homicidal ideations, intention or plans. He was recently admitted to [facility] (from 09/07/14-09/12/14) due to aggressiveness and delusional thoughts but has not been taking his prescribed medications or keep his follow up appointment. Patient has no insight into his problem and has been exercising poor judgment.   Interval history: Patient stated that he has been compliant with his medications as prescribed and has no stomach upset but reportedly has some mild dry mouth. Patient staff RN also reported patient has been taking his medication without hesitation or assistance. Patient stated he has been feeling better and has no significant distress. Patient continued to work on his goal-directed activity of leaving the hospital as soon as possible and minimizes his symptoms of mania versus hypomania. Patient continued to have loud and pressured speech, but not irritable today. He has linear and goal directed and less tangentiality, circumstantiality, grandiose delusions  and bizarre behaviors. Patient has a poor to fair insight, judgment and impulse control.   Past Psychiatric History: Past Medical History  Diagnosis Date  . Severe aortic valve stenosis     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 at [facility]  . COPD (chronic  obstructive pulmonary disease)   . Thoracic ascending aortic aneurysm     repaired 06/2014 at [facility]  . COPD (chronic obstructive pulmonary disease)   . BPH (benign prostatic hyperplasia)   . Pacemaker     battery replaced 06/2014 at [facility]  . SSS (sick sinus syndrome)     s/p PPM   . CAD (coronary artery disease)     ?MI in 2007  . Pneumothorax     traumatic 2007  . Anxiety and depression   . Psychosis   . Bipolar disorder     reports that he has quit smoking. His smoking use included Cigarettes. He smoked 1.00 pack per day. He does not have any smokeless tobacco history on file. He reports that he drinks alcohol. He reports that he uses illicit drugs (Marijuana). Family History  Problem Relation Age of Onset  . CAD Father   . Diabetes Father   . High blood pressure Brother   . Cancer Brother    Family History Substance Abuse:  (Unable to assess/Pt refused to participate) Family Supports: Yes, List: (Two sons. Sara Selvidge is his POA.)) Living Arrangements: Alone Can pt return to current living arrangement?: No Abuse/Neglect [facility]) Physical Abuse: Denies Verbal Abuse: Denies Sexual Abuse: Denies Allergies:   Allergies  Allergen Reactions  . Tiotropium Bromide Monohydrate Nausea Only and Anaphylaxis    Tremors    ACT Assessment Complete:  Yes:    Educational Status    Risk to Self: Risk to self with the past 6 months Suicidal Ideation: No (Unable to assess/Pt refused to participate) Suicidal Intent: No (Unable to assess/Pt refused to participate) Is patient at risk for suicide?: No (Unable to assess/Pt refused to participate) Suicidal Plan?: No (Unable to assess/Pt refused to participate) Access to Means: No (Unable to assess/Pt refused to participate) What has been your use of drugs/alcohol within the last 12 months?: Pt uses marijuana regularly Previous Attempts/Gestures: No (Unable to assess/Pt refused to participate) How many times?: 0 Other Self Harm Risks:  None identified (Unable to assess/Pt refused to participate) Triggers for Past Attempts: None known Intentional Self Injurious Behavior: None (Unable to assess/Pt refused to participate) Family Suicide History: Unable to assess Recent stressful life event(s): Other (Comment) (Unable to assess/Pt refused to participate) Persecutory voices/beliefs?: No (Unable to assess/Pt refused to participate) Depression: No (Unable to assess/Pt refused to participate) Depression Symptoms: Feeling angry/irritable Substance abuse history and/or treatment for substance abuse?: Yes Suicide prevention information given to non-admitted patients: Not applicable  Risk to Others: Risk to Others within the past 6 months Homicidal Ideation: No (Unable to assess/Pt refused to participate) Thoughts of Harm to Others: No (Unable to assess/Pt refused to participate) Current Homicidal Intent: No (Unable to assess/Pt refused to participate) Current Homicidal Plan: No (Unable to assess/Pt refused to participate) Access to Homicidal Means: No (Unable to assess/Pt refused to participate) Identified Victim: Unable to assess/Pt refused to participate History of harm to others?: No (Unable to assess/Pt refused to participate) Assessment of Violence: None Noted (Unable to assess/Pt refused to participate) Violent Behavior Description: Unable to assess/Pt refused to participate Does patient have access to weapons?: No (Unable to assess/Pt refused to participate) Criminal Charges Pending?: No (Unable to assess/Pt refused  to participate) Does patient have a court date: No (Unable to assess/Pt refused to participate)  Abuse: Abuse/Neglect Assessment (Assessment to be complete while patient is alone) Physical Abuse: Denies Verbal Abuse: Denies Sexual Abuse: Denies Exploitation of patient/patient's resources: Denies Self-Neglect: Denies  Prior Inpatient Therapy: Prior Inpatient Therapy Prior Inpatient Therapy: Yes Prior Therapy  Dates: In the last month Prior Therapy Facilty/Provider(s): [facility] in [place] Reason for Treatment: inpt mental health- psychosis  Prior Outpatient Therapy: Prior Outpatient Therapy Prior Outpatient Therapy: No Prior Therapy Dates: Unknown Prior Therapy Facilty/Provider(s): Unknown Reason for Treatment: Unknown  Additional Information: Additional Information 1:1 In Past 12 Months?: No CIRT Risk: No Elopement Risk: No Does patient have medical clearance?: Yes   Objective: Blood pressure 96/61, pulse 89, temperature 98.1 F (36.7 C), temperature source Oral, resp. rate 19, height _0  (1.803 m), weight 71.714 kg (158 lb 1.6 oz), SpO2 99 %.Body mass index is 22.06 kg/(m^2). Results for orders placed or performed during the hospital encounter of 09/21/14 (from the past 72 hour(s))  Valproic acid level     Status: Abnormal   Collection Time: 09/28/14  5:07 AM  Result Value Ref Range   Valproic Acid Lvl <10.0 (L) 50.0 - 100.0 ug/mL    Comment: Performed at [facility]  Comprehensive metabolic panel     Status: Abnormal   Collection Time: 09/28/14  5:07 AM  Result Value Ref Range   Sodium 145 137 - 147 mEq/L   Potassium 5.0 3.7 - 5.3 mEq/L   Chloride 102 96 - 112 mEq/L   CO2 34 (H) 19 - 32 mEq/L   Glucose, Bld 93 70 - 99 mg/dL   BUN 18 6 - 23 mg/dL   Creatinine, Ser 0.87 0.50 - 1.35 mg/dL   Calcium 9.4 8.4 - 10.5 mg/dL   Total Protein 6.9 6.0 - 8.3 g/dL   Albumin 3.1 (L) 3.5 - 5.2 g/dL   AST 22 0 - 37 U/L   ALT 27 0 - 53 U/L   Alkaline Phosphatase 87 39 - 117 U/L   Total Bilirubin <0.2 (L) 0.3 - 1.2 mg/dL   GFR calc non Af Amer 87 (L) >90 mL/min   GFR calc Af Amer >90 >90 mL/min    Comment: (NOTE) The eGFR has been calculated using the CKD EPI equation. This calculation has not been validated in all clinical situations. eGFR's persistently <90 mL/min signify possible Chronic Kidney Disease.    Anion gap 9 5 - 15   Labs are reviewed and are pertinent for  the above.  Current Facility-Administered Medications  Medication Dose Route Frequency Provider Last Rate Last Dose  . acetaminophen (TYLENOL) tablet 650 mg  650 mg Oral Q6H PRN Rise Patience, MD       Or  . acetaminophen (TYLENOL) suppository 650 mg  650 mg Rectal Q6H PRN Rise Patience, MD      . albuterol (PROVENTIL) (2.5 MG/3ML) 0.083% nebulizer solution 2.5 mg  2.5 mg Nebulization Q2H PRN Rise Patience, MD   2.5 mg at 09/29/14 0537  . albuterol (PROVENTIL) (2.5 MG/3ML) 0.083% nebulizer solution 2.5 mg  2.5 mg Nebulization QID Oswald Hillock, MD   2.5 mg at 09/29/14 1537  . antiseptic oral rinse (CPC / CETYLPYRIDINIUM CHLORIDE 0.05%) solution 7 mL  7 mL Mouth Rinse BID Samuella Cota, MD   7 mL at 09/29/14 1000  . aspirin chewable tablet 81 mg  81 mg Oral Daily Mariea Clonts, MD  81 mg at 09/29/14 0927  . atorvastatin (LIPITOR) tablet 40 mg  40 mg Oral q1800 Mojeed Akintayo   40 mg at 09/28/14 1718  . budesonide (PULMICORT) nebulizer solution 0.25 mg  0.25 mg Nebulization BID Rise Patience, MD   0.25 mg at 09/29/14 0826  . cefUROXime (CEFTIN) tablet 500 mg  500 mg Oral BID WC Samuella Cota, MD   500 mg at 09/29/14 7408  . divalproex (DEPAKOTE ER) 24 hr tablet 1,000 mg  1,000 mg Oral QHS Durward Parcel, MD   1,000 mg at 14/48/18 5631  . folic acid (FOLVITE) tablet 1 mg  1 mg Oral Daily Rise Patience, MD   1 mg at 09/29/14 4970  . furosemide (LASIX) tablet 20 mg  20 mg Oral Daily Samuella Cota, MD   20 mg at 09/29/14 2637  . gabapentin (NEURONTIN) capsule 400 mg  400 mg Oral TID Durward Parcel, MD   400 mg at 09/29/14 8588  . hydrOXYzine (ATARAX/VISTARIL) tablet 50 mg  50 mg Oral QHS Durward Parcel, MD   50 mg at 09/28/14 2146  . Influenza vac split quadrivalent PF (FLUARIX) injection 0.5 mL  0.5 mL Intramuscular Tomorrow-1000 Rise Patience, MD   0.5 mL at 09/25/14 1000  . LORazepam (ATIVAN) tablet 0.5 mg  0.5 mg  Oral TID PRN Gardiner Barefoot, NP   0.5 mg at 09/28/14 2146   Or  . LORazepam (ATIVAN) injection 0.5 mg  0.5 mg Intramuscular TID PRN Gardiner Barefoot, NP      . LORazepam (ATIVAN) tablet 0.5 mg  0.5 mg Oral Once Gardiner Barefoot, NP   0.5 mg at 09/28/14 0330  . multivitamin with minerals tablet 1 tablet  1 tablet Oral Daily Rise Patience, MD   1 tablet at 09/29/14 5027  . nicotine (NICODERM CQ - dosed in mg/24 hours) patch 14 mg  14 mg Transdermal Daily Samuella Cota, MD   14 mg at 09/26/14 1102  . pneumococcal 23 valent vaccine (PNU-IMMUNE) injection 0.5 mL  0.5 mL Intramuscular Tomorrow-1000 Rise Patience, MD   0.5 mL at 09/25/14 1000  . polyethylene glycol (MIRALAX / GLYCOLAX) packet 17 g  17 g Oral BID Samuella Cota, MD   17 g at 09/29/14 7412  . sodium chloride 0.9 % injection 3 mL  3 mL Intravenous Q12H Rise Patience, MD   3 mL at 09/24/14 1000  . sodium chloride 0.9 % injection 3 mL  3 mL Intravenous Q12H Rise Patience, MD   3 mL at 09/24/14 2136  . tamsulosin (FLOMAX) capsule 0.4 mg  0.4 mg Oral QPC supper Mojeed Akintayo   0.4 mg at 09/28/14 1717  . thiamine (VITAMIN B-1) tablet 100 mg  100 mg Oral Daily Rise Patience, MD   100 mg at 09/29/14 8786   Or  . thiamine (B-1) injection 100 mg  100 mg Intravenous Daily Rise Patience, MD        Psychiatric Specialty Exam:     Blood pressure 96/61, pulse 89, temperature 98.1 F (36.7 C), temperature source Oral, resp. rate 19, height _0  (1.803 m), weight 71.714 kg (158 lb 1.6 oz), SpO2 99 %.Body mass index is 22.06 kg/(m^2).  General Appearance: Disheveled  Eye Contact::  Minimal  Speech:  Garbled  Volume:  Increased  Mood:  Angry and Irritable  Affect:  Labile  Thought Process:  Circumstantial and Disorganized  Orientation:  Full (  Time, Place, and Person)  Thought Content:  Delusions and grandiose  Suicidal Thoughts:  No  Homicidal Thoughts:  No  Memory:  Immediate;    Fair Recent;   Fair Remote;   Fair  Judgement:  Impaired  Insight:  Lacking  Psychomotor Activity:  Increased  Concentration:  Poor  Recall:  [place]: Good  Akathisia:  No  Handed:  Right  AIMS (if indicated):     Assets:  Communication Skills  Sleep:   poor   Musculoskeletal: Strength & Muscle Tone: within normal limits Gait & Station: normal Patient leans: N/A  Treatment Plan Summary: Daily contact with patient to assess and evaluate symptoms and progress in treatment Medication management  Encouraged compliant with his medication management Continue Depakote ER 500 mg ii PO Qhs for bipolar mania  Check valproic acid level and comprehensive metabolic panel on morning for therapeutic values and appropriate adjustment if needed Continue neurontin 400 mg PO TID for neuropathy and mood and Hydroxyzine 50 mg PO Qhs for insomnia  Lilith Solana,JANARDHAHA R., MD 09/29/2014 4:10 PM

## 2014-09-29 NOTE — Progress Notes (Signed)
PROGRESS NOTE  [name] [name] JXB:[id number] DOB: 08/09/1946 DOA: 09/21/2014 PCP: No PCP Per Patient  Summary: 68 year old man with recurrent severe manic and psychotic features presented to the ER on 11/25 for the seventh time in the last month. He presented with IVC papers. He was noted to be delusional at hotel, displayed belligerent behavior in ED, requiring IM injection, police restraint and shackling to the bed. [facility]. 11/27 Psychiatrist recommended inpatient psych treatment. He was extremely uncooperative and transferred to the emergency department for shortness of breath. There he was found to have oxygen saturation 89% on room air with diffuse wheezes. He was admitted for COPD exacerbation, possible mild CHF, possible pneumonia. His condition rapidly improved with Lasix, antibiotics and breathing treatments. He is medically clear for discharge from medical Hospital to behavioral health center.  Assessment/Plan: 1. Bipolar disorder with recurrent severe manic, psychotic features.Management per psychiatry. 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 No focal deficits noted. CT head negative 06/2014 at [facility] (no MRI/pacemaker). Patient continues to demonstrate manic symptoms but no psychosis or agitation and has been cooperative. As per nursing, patient had been refusing intermittent doses of Depakote. Psychiatry follow-up appreciated-recommend continuing Neurontin for neuropathy and mood, hydroxyzine for insomnia and Depakote ER for mood swings. Seems somewhat better than yesterday. Took his medications last night. 2. Mild COPD exacerbation, resolved. Avoid steroids at this point given clinical improvement. 3. Mild acute diastolic heart failure. Resolved. Continue low-dose Lasix. No Echo in system- will check results. 4. Possible pneumonia. Clinically resolved. Afebrile. Complete a week's course of oral  antibiotics. 5. Abnormal TSH. Significance unclear. TSH 0.357 November 12, 0.053 November 28. Free T4, T3 total within normal limits November 12. Repeat T4 normal, T3 slightly low. Overall doubt clinically significant. He has no other features to suggest hyperthyroidism. He seems to be responding to Depakote. Continue treatment per psychiatry. Recommend follow-up of full TFTs in 4-6 weeks. 6. Status [facility] not on anticoagulation. Continue aspirin, statin. 7. Status post pacemaker placement; battery replaced September 2015 and [facility] heart block because the battery had run out and the patient failed to follow-up.  8. Prolonged QTc. Avoid antipsychotics. Last QTC was 475 ms on 11/28    Code Status: full code DVT prophylaxis: Lovenox Family Communication: None at bedside Disposition Plan: As per psychiatry recommendations.   Consultants:  Psychiatry   Procedures:  None  Antibiotics:  Cefepime 11/28 >>11/29  Cefuroxime >  HPI/Subjective: Patient denies complaints.   Objective: Filed Vitals:   09/29/14 0548 09/29/14 0827 09/29/14 1203 09/29/14 1404  BP: 107/71   96/61  Pulse: 79   89  Temp: 97.1 F (36.2 C)   98.1 F (36.7 C)  TempSrc: Oral   Oral  Resp: 18   19  Height:      Weight:      SpO2: 100% 99% 100% 99%    Intake/Output Summary (Last 24 hours) at 09/29/14 1712 Last data filed at 09/29/14 0819  Gross per 24 hour  Intake    360 ml  Output      0 ml  Net    360 ml     Filed Weights   09/27/14 0657 09/28/14 0442 09/29/14 0451  Weight: 69.899 kg (154 lb 1.6 oz) 70.217 kg (154 lb 12.8 oz) 71.714 kg (158 lb 1.6 oz)  Exam:    Physical Exam: Gen. exam: Pleasant middle-aged male ambulating comfortably in the halls. Lungs: Normal respiratory effort, bilateral clear to auscultation, no crackles or wheezes.  Heart: Regular rate and rhythm,  S1 and S2 normal, no murmurs, rubs auscultated. Non-telemetry. Abdomen: BS normoactive,soft,nondistended,non-tender to palpation,no organomegaly Extremities: No pretibial edema, no erythema, no cyanosis, no clubbing Neuro : Alert and oriented to time, place and person, No focal deficits   Data Reviewed: Free T4 within normal limits. T3 slightly low. The studies were just performed 17 days ago and were unremarkable.  Scheduled Meds: . albuterol  2.5 mg Nebulization QID  . antiseptic oral rinse  7 mL Mouth Rinse BID  . aspirin  81 mg Oral Daily  . atorvastatin  40 mg Oral q1800  . budesonide (PULMICORT) nebulizer solution  0.25 mg Nebulization BID  . cefUROXime  500 mg Oral BID WC  . divalproex  1,000 mg Oral QHS  . folic acid  1 mg Oral Daily  . furosemide  20 mg Oral Daily  . gabapentin  400 mg Oral TID  . hydrOXYzine  50 mg Oral QHS  . Influenza vac split quadrivalent PF  0.5 mL Intramuscular Tomorrow-1000  . LORazepam  0.5 mg Oral Once  . multivitamin with minerals  1 tablet Oral Daily  . nicotine  14 mg Transdermal Daily  . pneumococcal 23 valent vaccine  0.5 mL Intramuscular Tomorrow-1000  . polyethylene glycol  17 g Oral BID  . sodium chloride  3 mL Intravenous Q12H  . sodium chloride  3 mL Intravenous Q12H  . tamsulosin  0.4 mg Oral QPC supper  . thiamine  100 mg Oral Daily   Or  . thiamine  100 mg Intravenous Daily   Continuous Infusions:   Principal Problem:   COPD exacerbation Active Problems:   Bipolar I disorder, single manic episode, severe, with psychosis   H/O aortic valve replacement   History of permanent cardiac pacemaker placement   SOB (shortness of breath)   HCAP (healthcare-associated pneumonia)   Acute diastolic CHF (congestive heart failure)   Manic behavior   Time spent    20 minutes  Summary: ER 11/5-11/9: IVC placed for psychosis at [facility]. Psychiatry recommended inpatient treatment. Patient with combativeness required Ativan, Haldol, Geodon,  police presence and shackling to bed. [facility]  11/9-11/16 hospitalized for bipolar disorder, recurrent, severe, manic with psychotic features. Antipsychotics were avoided because of QT C elevation. Treated with Depakote ER 1000 mg QHS, Trazodone 50 mg QHS PRN sleep and Ativan 0.5 mg as needed to address agitation and mania.  ER 11/18 presented with chest pain. Patient left. 11/22 complaint of shortness of breath, cough, ran out of inhaler. Discharged with prescription for inhaler. 11/22 second visit same day for albuterol neb Rx. Left AMA, verbally abusive. 11/24 SOB. Left AMA. Refused interventions. 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 Treated for COPD exacerbation   Johnnie Moten, MD, FACP, FHM. [facility] Pager [phone]  If 7PM-7AM, please contact night-coverage [email or web address] Password [email or web address] 09/29/2014, 5:12 PM

## 2014-09-29 NOTE — Progress Notes (Signed)
Echocardiogram 2D Echocardiogram has been performed.  [name] [name], [name] M 09/29/2014, 4:31 PM

## 2014-09-29 NOTE — Progress Notes (Addendum)
Clinical Social Work  CSW continues to search for inpatient placement and contacted the following facilities:  [facility]- spoke with admissions who reports they filled all beds yesterday but might have available beds today and will call if they can accept. (Addendum 1600: denied on 12/3)  [facility]- denied on 11/30  [facility]- [facility]. Referral faxed.  [facility]- no available beds  [facility]- no available beds  [name]- denied on 12/1  [facility]- spoke with admissions Verdon Cummins(Jesse) who reports possible DC today and will call if they can accept patient.  [facility]- per admissions Junious Dresser(Connie) patient is on waiting list  [facility]- left another message with admission to determine if they have reviewed referral.  [facility]- denied on 12/3  [facility]- denied on 12/2 due to acuity  [facility]- no [facility] beds  [facility]- denied on 12/3  [facility]- no available beds  [facility]- no available beds  [facility]- admissions reports they have not reviewed but will call CSW if they can accept.  [facility]- denied on 12/1 due to dementia  [facility]- denied on 12/2 due to acuity  [facility]- no available beds   [facility]- no available beds  [facility]- denied on 12/3  [facility]- admissions reports that referral was lost and asked CSW to re-fax information. Referral re-faxed.  CSW will continue to follow.  [place] Bluff[place]olly Keaja Re[place], [place] [phone]

## 2014-09-30 LAB — COMPREHENSIVE METABOLIC PANEL
ALT: 29 U/L (ref 0–53)
AST: 24 U/L (ref 0–37)
Albumin: 2.9 g/dL — ABNORMAL LOW (ref 3.5–5.2)
Alkaline Phosphatase: 87 U/L (ref 39–117)
Anion gap: 10 (ref 5–15)
BUN: 14 mg/dL (ref 6–23)
CO2: 30 mEq/L (ref 19–32)
Calcium: 9 mg/dL (ref 8.4–10.5)
Chloride: 96 mEq/L (ref 96–112)
Creatinine, Ser: 0.79 mg/dL (ref 0.50–1.35)
GFR calc Af Amer: >90 mL/min (ref >90)
GFR calc non Af Amer: >90 mL/min (ref >90)
Glucose, Bld: 130 mg/dL — ABNORMAL HIGH (ref 70–99)
Potassium: 4.6 mEq/L (ref 3.7–5.3)
Sodium: 136 mEq/L — ABNORMAL LOW (ref 137–147)
Total Bilirubin: <0.2 mg/dL — ABNORMAL LOW (ref 0.3–1.2)
Total Protein: 6.7 g/dL (ref 6.0–8.3)

## 2014-09-30 LAB — VALPROIC ACID LEVEL: Valproic Acid Lvl: 51 ug/mL (ref 50.0–100.0)

## 2014-09-30 MED ORDER — LORAZEPAM 2 MG/ML IJ SOLN
2.0000 mg | Freq: Once | INTRAMUSCULAR | Status: AC
  Administered 2014-09-30: 2 mg via INTRAMUSCULAR

## 2014-09-30 MED ORDER — DIVALPROEX SODIUM ER 500 MG PO TB24
1500.0000 mg | ORAL_TABLET | Freq: Every day | ORAL | Status: DC
  Administered 2014-09-30 – 2014-10-03 (×4): 1500 mg via ORAL
  Filled 2014-09-30 (×5): qty 3

## 2014-09-30 NOTE — Progress Notes (Addendum)
Per Safety sitter at bedside (from day shift), pt received a phone call from the pt who was in the room next to him at the hospital yesterday. Per the KelsoSitter, the conversation consisted of the pt asking the person on the phone to bring him Marijuana to the hospital on Saturday. Per the sitter the pt asked the person on the phone to bring the Marijuana already rolled and that he would tell that person how he could get some cocaine. Per the sitter, the person is supposed to come tomorrow to bring the pt drugs. Night floor RN, A/C, and Security made aware of this conversation as it was relayed to the writer from the bedside sitter.   Pt is being disrespectful to the staff and talking ugly. He is stating that he is going to masturbate with the sitter in the room and they can watch or not, he doesn't care. Pt is insisting that the sitter sit outside the room and that they cannot be in the room with him. The policy and procedure discussed with the pt, that the sitter has to be at the bedside for safety. The pt insists that he be in the room alone. The A/C and security have been up on the floor trying to enforce the policy as well. Pt was becoming more agitated. The A/C talking with the night coverage and got an ok for the sitter to sit outside the door to watch the pt. An order was also received to give the pt 1mg  of IM Ativan. Pt is refusing this med until he gets a number from his wallet that is locked in security. The A/C is going to get that number for the pt and then the night RN will attempt to give the medication. Night RN will continue to monitor.   Arta BruceDeutsch, Deitrick Ferreri [facility] 09/30/2014 7:58 PM

## 2014-09-30 NOTE — Progress Notes (Signed)
PROGRESS NOTE  Jennings Booksndrew H Higashi VWU:[id number] DOB: 09/05/1946 DOA: 09/21/2014 PCP: No PCP Per Patient  Summary: 68 year old man with recurrent severe manic and psychotic features presented to the ER on 11/25 for the seventh time in the last month. He presented with IVC papers. He was noted to be delusional at hotel, displayed belligerent behavior in ED, requiring IM injection, police restraint and shackling to the bed. [facility] 11/27 Psychiatrist recommended inpatient psych treatment. He was extremely uncooperative and transferred to the emergency department for shortness of breath. There he was found to have oxygen saturation 89% on room air with diffuse wheezes. He was admitted for COPD exacerbation, possible mild CHF, possible pneumonia. His condition rapidly improved with Lasix, antibiotics and breathing treatments. He is medically clear for discharge from medical Hospital to behavioral health center.  Assessment/Plan: 1. Bipolar disorder with recurrent severe manic, psychotic features.Management per psychiatry. 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 No focal deficits noted. CT head negative 06/2014 at [facility] (no MRI/pacemaker). Patient continues to demonstrate manic symptoms but no psychosis or agitation and has been cooperative. As per nursing, patient had been refusing intermittent doses of Depakote. Psychiatry follow-up appreciated-recommend continuing Neurontin for neuropathy and mood, hydroxyzine for insomnia and Depakote ER for mood swings. Slowly improving. Psychiatry continues to follow and make medication changes. Psychiatry recommends keeping in hospital until follow-up on Monday. Psychiatry recommended renewing IVC paperwork. 2. Mild COPD exacerbation, resolved. Avoid steroids at this point given clinical improvement. 3. Mild acute diastolic heart failure. Resolved. Continue low-dose Lasix.  Echo results show reduced AF. Discussed with Dr. Willa RoughJeffrey Katz, Cardiology who reviewed the case and recommends no further workup especially given no symptoms. Patient to [facility]. 4. Possible pneumonia. Clinically resolved. Afebrile. Complete a week's course of oral antibiotics. 5. Abnormal TSH. Significance unclear. TSH 0.357 November 12, 0.053 November 28. Free T4, T3 total within normal limits November 12. Repeat T4 normal, T3 slightly low. Overall doubt clinically significant. He has no other features to suggest hyperthyroidism. He seems to be responding to Depakote. Continue treatment per psychiatry. Recommend follow-up of full TFTs in 4-6 weeks. 6. Status post aortic valve [facility] 2015 not on anticoagulation. Continue aspirin, statin. 7. Status post pacemaker placement; battery replaced September 2015 and [facility] block because the battery had run out and the patient failed to follow-up.  8. Prolonged QTc. Avoid antipsychotics. Last QTC was 475 ms on 11/28    Code Status: full code DVT prophylaxis: Lovenox Family Communication: None at bedside Disposition Plan: As per psychiatry recommendations.   Consultants:  Psychiatry   Procedures:  None  Antibiotics:  Cefepime 11/28 >>11/29  Cefuroxime >  HPI/Subjective: Patient denies complaints. Thinks that he is going home today.  Objective: Filed Vitals:   09/29/14 2020 09/30/14 0450 09/30/14 0920 09/30/14 1412  BP: 120/89 125/64    Pulse: 87 85    Temp: 97.8 F (36.6 C) 98.4 F (36.9 C)    TempSrc: Oral Oral    Resp: 18 18    Height:      Weight:  71.305 kg (157 lb 3.2 oz)    SpO2: 92% 97% 97% 97%    Intake/Output Summary (Last 24 hours) at 09/30/14 1418 Last data filed at 09/30/14 0900  Gross per 24 hour  Intake  720 ml  Output      0 ml  Net    720  ml     Filed Weights   09/28/14 0442 09/29/14 0451 09/30/14 0450  Weight: 70.217 kg (154 lb 12.8 oz) 71.714 kg (158 lb 1.6 oz) 71.305 kg (157 lb 3.2 oz)    Exam:    Physical Exam: Gen. exam: Pleasant middle-aged male ambulating comfortably in the halls. Lungs: Normal respiratory effort, bilateral clear to auscultation, no crackles or wheezes.  Heart: Regular rate and rhythm, S1 and S2 normal, no murmurs, rubs auscultated. Non-telemetry. Abdomen: BS normoactive,soft,nondistended,non-tender to palpation,no organomegaly Extremities: No pretibial edema, no erythema, no cyanosis, no clubbing Neuro : Alert and oriented to time, place and person, No focal deficits   Data Reviewed: Free T4 within normal limits. T3 slightly low. The studies were just performed 17 days ago and were unremarkable.  Scheduled Meds: . albuterol  2.5 mg Nebulization QID  . antiseptic oral rinse  7 mL Mouth Rinse BID  . aspirin  81 mg Oral Daily  . atorvastatin  40 mg Oral q1800  . budesonide (PULMICORT) nebulizer solution  0.25 mg Nebulization BID  . cefUROXime  500 mg Oral BID WC  . divalproex  1,500 mg Oral QHS  . folic acid  1 mg Oral Daily  . furosemide  20 mg Oral Daily  . gabapentin  400 mg Oral TID  . hydrOXYzine  50 mg Oral QHS  . Influenza vac split quadrivalent PF  0.5 mL Intramuscular Tomorrow-1000  . LORazepam  0.5 mg Oral Once  . multivitamin with minerals  1 tablet Oral Daily  . nicotine  14 mg Transdermal Daily  . pneumococcal 23 valent vaccine  0.5 mL Intramuscular Tomorrow-1000  . polyethylene glycol  17 g Oral BID  . sodium chloride  3 mL Intravenous Q12H  . sodium chloride  3 mL Intravenous Q12H  . tamsulosin  0.4 mg Oral QPC supper  . thiamine  100 mg Oral Daily   Or  . thiamine  100 mg Intravenous Daily   Continuous Infusions:   Principal Problem:   COPD exacerbation Active Problems:   Bipolar I disorder, single manic episode, severe, with psychosis   H/O aortic valve  replacement   History of permanent cardiac pacemaker placement   SOB (shortness of breath)   HCAP (healthcare-associated pneumonia)   Acute diastolic CHF (congestive heart failure)   Manic behavior   Time spent    20 minutes  Summary: ER 11/5-11/9: [facility]C placed for psychosis at [facility]. Psychiatry recommended inpatient treatment. Patient with combativeness required Ativan, Haldol, Geodon, police presence and shackling to bed. [facility]  [facility]  11/9-11/16 hospitalized for bipolar disorder, recurrent, severe, manic with psychotic features. Antipsychotics were avoided because of QT C elevation. Treated with Depakote ER 1000 mg QHS, Trazodone 50 mg QHS PRN sleep and Ativan 0.5 mg as needed to address agitation and mania.  ER 11/18 presented with chest pain. Patient left. 11/22 complaint of shortness of breath, cough, ran out of inhaler. Discharged with prescription for inhaler. 11/22 second visit same day for albuterol neb Rx. Left AMA, verbally abusive. 11/24 SOB. Left AMA. Refused interventions. 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 Treated for COPD exacerbation   Trinh Sanjose, MD, FACP, FHM. [facility] Pager [phone]  If 7PM-7AM, please contact night-coverage [email or web address] Password [email or web address] 09/30/2014, 2:18 PM

## 2014-09-30 NOTE — Progress Notes (Addendum)
Clinical Social Work  CSW rounded with psych MD on patient. Psych MD reports patient is not cleared to DC today and will increase patient's Depakote dose. CSW spoke with patient's DSS worker Jasmine December([phone]) who reports she is a community Wellsite geologistmental health worker. DSS reports that patient violated probation but was doing well with following probation guidelines until he had heart surgery. Patient is non compliant in the community with following up for appointments and taking his medications. DSS has been working with patient since 09/03/14 and will continue to follow with patient in the community.   Psych MD reports that patient's medications will continue to be adjusted. Bedside RN reports that patient remains manic and delusional. Patient's IVC expires today and psych MD recommended that IVC be extended. MD signed IVC forms which were faxed to Magistrate and CSW called to confirm that paperwork was received. Magistrate reports paperwork was received and that [employer] will serve patient.  CSW continues to search for inpatient placement and the following facilities were contacted:  [facility]- denied on 12/3  [facility]  [facility]- admissions reports anticipated DC and will review referral.  [facility]- no available beds  [facility]- no available beds  [name]- denied on 12/1  [facility]- admissions reports they have lost referral and asked CSW to refax information. Referral faxed. (Addendum 1600: denied on 12/4 due to MD feeling patient needed placement at state hospital)  [facility]- [facility] on [facility]  [facility]- denied 12/4  [facility]- denied on 12/3  [name]- denied on 12/2 due to acuity  [facility]- available beds. Referral faxed.  [facility]- denied on 12/[facility]  [facility]- [facility]  [facility]- no available beds  [facility]- admissions confirms that referral was received but no available beds today so it has not been reviewed.  [facility]- denied  on 12/1 due to dementia  [facility]- denied on 12/2 due to acuity  [facility]- no available beds  [facility]- no available beds  [facility]- denied on 12/3  [facility]- denied on 12/4  CSW will continue to follow.  CopeHolly Tavin [place], [place] [phone]

## 2014-09-30 NOTE — Consult Note (Signed)
Psychiatry Consult follow up note  Reason for Consult:  Medication management for bipolar disorder and disposition plans Referring Physician: Samuella Cota, MD  [name] [name] is an 68 y.o. male. Total Time spent with patient: 20 minutes  Assessment: AXIS I:  Bipolar disorder, most recent episode is mania AXIS II:  Deferred AXIS III:   Past Medical History  Diagnosis Date  . 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 COPD (chronic obstructive pulmonary disease)   . Thoracic ascending aortic aneurysm     repaired 06/2014 at [facility]  . COPD (chronic obstructive pulmonary disease)   . BPH (benign prostatic hyperplasia)   . Pacemaker     battery replaced 06/2014 at [facility]  . SSS (sick sinus syndrome)     s/p PPM   . CAD (coronary artery disease)     ?MI in 2007  . Pneumothorax     traumatic 2007  . Anxiety and depression   . Psychosis   . Bipolar disorder    AXIS IV:  economic problems, other psychosocial or environmental problems and problems related to social environment AXIS V:  21-30 behavior considerably influenced by delusions or hallucinations OR serious impairment in judgment, communication OR inability to function in almost all areas  Plan:  Case discussed with Radene Ou, LCSW, patient needs involuntary commitment to a wide pre-maturely leaving the hospital and put himself in danger and others. 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 Supportive therapy provided about ongoing stressors.  Appreciate psychiatric consultation and follow up tomorrow Please contact [phone] or [phone] if needs further  assistance   Subjective:   [name] [name] is a 68 y.o. male patient admitted with grandiose delusions and bizarre behavior.  HPI: [name] [name] is an 68 y.o. male, Caucasian seen and chart reviewed for psychiatric consultation and evaluation of bipolar disorder and medication management. He was admitted to medical floor for medical stabilization of sob due to COPD and Pneumonia. Patient has been suffering with increased irritability, pressured speech, racing thoughts, grandiose delusions and inappropriate and bizarre behaviors. His antipsychotic medication seroquel was discontinued during his recent admission at [facility] due to cardiac structural abnormality and repair of valve in the recent past. He was treated with depakote which he was non compliant while in out patient care. Patient denied suicidal or homicidal ideations, intention or plans. He was recently admitted to [facility] (from 09/07/14-09/12/14) due to aggressiveness and delusional thoughts but has not been taking his prescribed medications or keep his follow up appointment. Patient has no insight into his problem and has been exercising poor judgment.   Interval history: Patient has been compliant with his medication and his valproic acid level is low therapeutic level. Patient has been tolerating his medication without significant side effect of the medications Patient has limited psychosocial support and living arrangements. Patient continued to be showing symptoms of mania and known noncompliant with his medication management and also has multiple charges with the trespassing and reportedly restraining orders from his son and also been on probation and probation violation. Patient will be dangerous to himself and others if he was not  treated at this time adequately. Patient staff RN also reported patient has been taking his medication but continued to be due to manic symptoms.   Patient has goal-directed activity of  leaving the hospital as soon as possible and minimizes his symptoms of mania and stated he would come back to the hospital to check his valproic acid level on Monday and stated his been staying when he 20 miles away from the hospital. Patient is not reliable based on his previous noncompliant behavior with medication management. Patient has grandiose thoughts, pressured speech, but not irritable today. He has less tangentiality, circumstantiality and bizarre behaviors. Patient has a poor to fair insight, judgment and impulse control.   Past Psychiatric History: Past Medical History  Diagnosis Date  . 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 COPD (chronic obstructive pulmonary disease)   . Thoracic ascending aortic aneurysm     repaired 06/2014 at [facility]  . COPD (chronic obstructive pulmonary disease)   . BPH (benign prostatic hyperplasia)   . Pacemaker     battery replaced 06/2014 at [facility]  . SSS (sick sinus syndrome)     s/p PPM   . CAD (coronary artery disease)     ?MI in 2007  . Pneumothorax     traumatic 2007  . Anxiety and depression   . Psychosis   . Bipolar disorder     reports that he has quit smoking. His smoking use included Cigarettes. He smoked 1.00 pack per day. He does not have any smokeless tobacco history on file. He reports that he drinks alcohol. He reports that he uses illicit drugs (Marijuana). Family History  Problem Relation Age of Onset  . CAD Father   . Diabetes Father   . High blood pressure Brother   . Cancer Brother    Family History Substance Abuse:  (Unable to assess/Pt refused to participate) Family Supports: Yes, List: (Two sons. [name] [name] is his POA.)) Living Arrangements: Alone Can pt return to current living arrangement?: No Abuse/Neglect [facility]) Physical Abuse: Denies Verbal Abuse: Denies Sexual Abuse: Denies Allergies:   Allergies  Allergen Reactions  . Tiotropium Bromide Monohydrate Nausea Only  and Anaphylaxis    Tremors    ACT Assessment Complete:  Yes:    Educational Status    Risk to Self: Risk to self with the past 6 months Suicidal Ideation: No (Unable to assess/Pt refused to participate) Suicidal Intent: No (Unable to assess/Pt refused to participate) Is patient at risk for suicide?: No (Unable to assess/Pt refused to participate) Suicidal Plan?: No (Unable to assess/Pt refused to participate) Access to Means: No (Unable to assess/Pt refused to participate) What has been your use of drugs/alcohol within the last 12 months?: Pt uses marijuana regularly Previous Attempts/Gestures: No (Unable to assess/Pt refused to participate) How many times?: 0 Other Self Harm Risks: None identified (Unable to assess/Pt refused to participate) Triggers for Past Attempts: None known Intentional Self Injurious Behavior: None (Unable to assess/Pt refused to participate) Family Suicide History: Unable to assess Recent stressful life event(s): Other (Comment) (Unable to assess/Pt refused to participate) Persecutory voices/beliefs?: No (Unable to assess/Pt refused to participate) Depression: No (Unable to assess/Pt refused to participate) Depression Symptoms: Feeling angry/irritable Substance abuse history and/or treatment for substance abuse?: Yes Suicide prevention information given to non-admitted patients: Not applicable  Risk to Others: Risk to Others within the past 6 months Homicidal Ideation: No (Unable to assess/Pt refused to participate) Thoughts of  Harm to Others: No (Unable to assess/Pt refused to participate) Current Homicidal Intent: No (Unable to assess/Pt refused to participate) Current Homicidal Plan: No (Unable to assess/Pt refused to participate) Access to Homicidal Means: No (Unable to assess/Pt refused to participate) Identified Victim: Unable to assess/Pt refused to participate History of harm to others?: No (Unable to assess/Pt refused to participate) Assessment of  Violence: None Noted (Unable to assess/Pt refused to participate) Violent Behavior Description: Unable to assess/Pt refused to participate Does patient have access to weapons?: No (Unable to assess/Pt refused to participate) Criminal Charges Pending?: No (Unable to assess/Pt refused to participate) Does patient have a court date: No (Unable to assess/Pt refused to participate)  Abuse: Abuse/Neglect Assessment (Assessment to be complete while patient is alone) Physical Abuse: Denies Verbal Abuse: Denies Sexual Abuse: Denies Exploitation of patient/patient's resources: Denies Self-Neglect: Denies  Prior Inpatient Therapy: Prior Inpatient Therapy Prior Inpatient Therapy: Yes Prior Therapy Dates: In the last month Prior Therapy Facilty/Provider(s): [facility] in [place] Reason for Treatment: inpt mental health- psychosis  Prior Outpatient Therapy: Prior Outpatient Therapy Prior Outpatient Therapy: No Prior Therapy Dates: Unknown Prior Therapy Facilty/Provider(s): Unknown Reason for Treatment: Unknown  Additional Information: Additional Information 1:1 In Past 12 Months?: No CIRT Risk: No Elopement Risk: No Does patient have medical clearance?: Yes   Objective: Blood pressure 125/64, pulse 85, temperature 98.4 F (36.9 C), temperature source Oral, resp. rate 18, height $RemoveBe'5\' 11"'OMVtsiqMZ$  (1.803 m), weight 71.305 kg (157 lb 3.2 oz), SpO2 97 %.Body mass index is 21.93 kg/(m^2). Results for orders placed or performed during the hospital encounter of 09/21/14 (from the past 72 hour(s))  Valproic acid level     Status: Abnormal   Collection Time: 09/28/14  5:07 AM  Result Value Ref Range   Valproic Acid Lvl <10.0 (L) 50.0 - 100.0 ug/mL    Comment: Performed at [facility]  Comprehensive metabolic panel     Status: Abnormal   Collection Time: 09/28/14  5:07 AM  Result Value Ref Range   Sodium 145 137 - 147 mEq/L   Potassium 5.0 3.7 - 5.3 mEq/L   Chloride 102 96 - 112 mEq/L   CO2 34  (H) 19 - 32 mEq/L   Glucose, Bld 93 70 - 99 mg/dL   BUN 18 6 - 23 mg/dL   Creatinine, Ser 0.87 0.50 - 1.35 mg/dL   Calcium 9.4 8.4 - 10.5 mg/dL   Total Protein 6.9 6.0 - 8.3 g/dL   Albumin 3.1 (L) 3.5 - 5.2 g/dL   AST 22 0 - 37 U/L   ALT 27 0 - 53 U/L   Alkaline Phosphatase 87 39 - 117 U/L   Total Bilirubin <0.2 (L) 0.3 - 1.2 mg/dL   GFR calc non Af Amer 87 (L) >90 mL/min   GFR calc Af Amer >90 >90 mL/min    Comment: (NOTE) The eGFR has been calculated using the CKD EPI equation. This calculation has not been validated in all clinical situations. eGFR's persistently <90 mL/min signify possible Chronic Kidney Disease.    Anion gap 9 5 - 15  Valproic acid level     Status: None   Collection Time: 09/30/14  4:08 AM  Result Value Ref Range   Valproic Acid Lvl 51.0 50.0 - 100.0 ug/mL    Comment: Performed at [facility]  Comprehensive metabolic panel     Status: Abnormal   Collection Time: 09/30/14  4:09 AM  Result Value Ref Range   Sodium 136 (L)  137 - 147 mEq/L    Comment: DELTA CHECK NOTED REPEATED TO VERIFY    Potassium 4.6 3.7 - 5.3 mEq/L   Chloride 96 96 - 112 mEq/L   CO2 30 19 - 32 mEq/L   Glucose, Bld 130 (H) 70 - 99 mg/dL   BUN 14 6 - 23 mg/dL   Creatinine, Ser 0.79 0.50 - 1.35 mg/dL   Calcium 9.0 8.4 - 10.5 mg/dL   Total Protein 6.7 6.0 - 8.3 g/dL   Albumin 2.9 (L) 3.5 - 5.2 g/dL   AST 24 0 - 37 U/L   ALT 29 0 - 53 U/L   Alkaline Phosphatase 87 39 - 117 U/L   Total Bilirubin <0.2 (L) 0.3 - 1.2 mg/dL   GFR calc non Af Amer >90 >90 mL/min   GFR calc Af Amer >90 >90 mL/min    Comment: (NOTE) The eGFR has been calculated using the CKD EPI equation. This calculation has not been validated in all clinical situations. eGFR's persistently <90 mL/min signify possible Chronic Kidney Disease.    Anion gap 10 5 - 15   Labs are reviewed and are pertinent for the above.  Current Facility-Administered Medications  Medication Dose Route Frequency Provider Last  Rate Last Dose  . acetaminophen (TYLENOL) tablet 650 mg  650 mg Oral Q6H PRN Rise Patience, MD       Or  . acetaminophen (TYLENOL) suppository 650 mg  650 mg Rectal Q6H PRN Rise Patience, MD      . albuterol (PROVENTIL) (2.5 MG/3ML) 0.083% nebulizer solution 2.5 mg  2.5 mg Nebulization Q2H PRN Rise Patience, MD   2.5 mg at 09/30/14 0503  . albuterol (PROVENTIL) (2.5 MG/3ML) 0.083% nebulizer solution 2.5 mg  2.5 mg Nebulization QID Oswald Hillock, MD   2.5 mg at 09/30/14 0920  . antiseptic oral rinse (CPC / CETYLPYRIDINIUM CHLORIDE 0.05%) solution 7 mL  7 mL Mouth Rinse BID [name] Cota, MD   7 mL at 09/29/14 2200  . aspirin chewable tablet 81 mg  81 mg Oral Daily Mariea Clonts, MD   81 mg at 09/30/14 1039  . atorvastatin (LIPITOR) tablet 40 mg  40 mg Oral q1800 Mojeed Akintayo   40 mg at 09/29/14 1717  . budesonide (PULMICORT) nebulizer solution 0.25 mg  0.25 mg Nebulization BID Rise Patience, MD   0.25 mg at 09/30/14 0919  . cefUROXime (CEFTIN) tablet 500 mg  500 mg Oral BID WC [name] Cota, MD   500 mg at 09/30/14 0800  . divalproex (DEPAKOTE ER) 24 hr tablet 1,500 mg  1,500 mg Oral QHS [name] Parcel, MD      . folic acid (FOLVITE) tablet 1 mg  1 mg Oral Daily Rise Patience, MD   1 mg at 09/30/14 1039  . furosemide (LASIX) tablet 20 mg  20 mg Oral Daily [name] Cota, MD   20 mg at 09/30/14 1039  . gabapentin (NEURONTIN) capsule 400 mg  400 mg Oral TID [name] Parcel, MD   400 mg at 09/30/14 1039  . hydrOXYzine (ATARAX/VISTARIL) tablet 50 mg  50 mg Oral QHS [name] Parcel, MD   50 mg at 09/29/14 2106  . Influenza vac split quadrivalent PF (FLUARIX) injection 0.5 mL  0.5 mL Intramuscular Tomorrow-1000 Rise Patience, MD   0.5 mL at 09/25/14 1000  . LORazepam (ATIVAN) tablet 0.5 mg  0.5 mg Oral TID PRN Gardiner Barefoot, NP   0.5  mg at 09/30/14 0749   Or  . LORazepam (ATIVAN) injection 0.5 mg  0.5 mg  Intramuscular TID PRN Gardiner Barefoot, NP      . LORazepam (ATIVAN) tablet 0.5 mg  0.5 mg Oral Once Gardiner Barefoot, NP   0.5 mg at 09/28/14 0330  . multivitamin with minerals tablet 1 tablet  1 tablet Oral Daily Rise Patience, MD   1 tablet at 09/30/14 1039  . nicotine (NICODERM CQ - dosed in mg/24 hours) patch 14 mg  14 mg Transdermal Daily [name] Cota, MD   14 mg at 09/26/14 1102  . pneumococcal 23 valent vaccine (PNU-IMMUNE) injection 0.5 mL  0.5 mL Intramuscular Tomorrow-1000 Rise Patience, MD   0.5 mL at 09/25/14 1000  . polyethylene glycol (MIRALAX / GLYCOLAX) packet 17 g  17 g Oral BID [name] Cota, MD   17 g at 09/30/14 1039  . sodium chloride 0.9 % injection 3 mL  3 mL Intravenous Q12H Rise Patience, MD   3 mL at 09/24/14 1000  . sodium chloride 0.9 % injection 3 mL  3 mL Intravenous Q12H Rise Patience, MD   3 mL at 09/24/14 2136  . tamsulosin (FLOMAX) capsule 0.4 mg  0.4 mg Oral QPC supper Mojeed Akintayo   0.4 mg at 09/29/14 1717  . thiamine (VITAMIN B-1) tablet 100 mg  100 mg Oral Daily Rise Patience, MD   100 mg at 09/30/14 1039   Or  . thiamine (B-1) injection 100 mg  100 mg Intravenous Daily Rise Patience, MD        Psychiatric Specialty Exam:     Blood pressure 125/64, pulse 85, temperature 98.4 F (36.9 C), temperature source Oral, resp. rate 18, height $RemoveBe'5\' 11"'ojyRpzfwA$  (1.803 m), weight 71.305 kg (157 lb 3.2 oz), SpO2 97 %.Body mass index is 21.93 kg/(m^2).  General Appearance: Disheveled  Eye Contact::  Minimal  Speech:  Garbled  Volume:  Increased  Mood:  Angry and Irritable  Affect:  Labile  Thought Process:  Circumstantial and Disorganized  Orientation:  Full (Time, Place, and Person)  Thought Content:  Delusions and grandiose  Suicidal Thoughts:  No  Homicidal Thoughts:  No  Memory:  Immediate;   Fair Recent;   Fair Remote;   Fair  Judgement:  Impaired  Insight:  Lacking  Psychomotor Activity:  Increased   Concentration:  Poor  Recall:  [place]: Good  Akathisia:  No  Handed:  Right  AIMS (if indicated):     Assets:  Communication Skills  Sleep:   poor   Musculoskeletal: Strength & Muscle Tone: within normal limits Gait & Station: normal Patient leans: N/A  Treatment Plan Summary: Daily contact with patient to assess and evaluate symptoms and progress in treatment Medication management  Encouraged compliant with his medication management Increase Depakote ER 1500 mg PO Qhs for bipolar mania  Check valproic acid level on Monday morning for therapeutic values and appropriate adjustment if needed Continue neurontin 400 mg PO TID for neuropathy and mood and Hydroxyzine 50 mg PO Qhs for insomnia  [name] Parcel., MD 09/30/2014 12:28 PM

## 2014-10-01 NOTE — Plan of Care (Signed)
Problem: Discharge Progression Outcomes Goal: Hemodynamically stable Outcome: Adequate for Discharge Goal: Complications resolved/controlled Outcome: Completed/Met Date Met:  10/01/14     

## 2014-10-01 NOTE — Clinical Social Work Note (Signed)
CSW followed up with phone calls to facilites where pt's information had been faxed and referral submitted  CSW spoke with Barbara CowerJason at [facility] who stated he was the only one working today and he did not know the pt CSW prompted further discussion about whether someone else could be contacted and he stated no CSW pushed for an answer about referral and Barbara CowerJason stated "[facility] says no" CSW pushed for more information as to whether they had the referral and he stated he did not see in his pile but pt was probable denied because they are only an 18 bed facility and have lots of referrals  CSW called [facility] who said that they are at capacity.  [facility] suggested refaxing paperwork but not until Monday because all paperwork is considered denied if they have no bed available and would need new information on Monday  CSW called [facility] and spoke with Dannielle Huhanny who sated pt was denied because of acuity.  CSW called [facility] who stated they would call when they had a bed but none today  CSW will continue to follow up with facilities.  Elray Bubaegina Suprina [place], LCSW [facility] Clinical Social Worker - Weekend Coverage cell #: [phone]

## 2014-10-01 NOTE — Progress Notes (Signed)
PROGRESS NOTE  [name] [name]:[id number] DOB: [birth year] DOA: 09/21/2014 PCP: No PCP Per Patient  Summary: 68 year old man with recurrent severe manic and psychotic features presented to the ER on 11/25 for the seventh time in the last month. He presented with IVC papers. He was noted to be delusional at hotel, displayed belligerent behavior in ED, requiring IM injection, police restraint and shackling to the bed. Transferred to [facility] ED. 11/27 Psychiatrist recommended inpatient psych treatment. He was extremely uncooperative and transferred to the emergency department for shortness of breath. There he was found to have oxygen saturation 89% on room air with diffuse wheezes. He was admitted for COPD exacerbation, possible mild CHF, possible pneumonia. His condition rapidly improved with Lasix, antibiotics and breathing treatments. He is medically clear for discharge from medical Hospital to behavioral health center.  Assessment/Plan: 1. Bipolar disorder with recurrent severe manic, psychotic features.Management per psychiatry. 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 No focal deficits noted. CT head negative 06/2014 at [facility] (no MRI/pacemaker). Patient continues to demonstrate manic symptoms but no psychosis or agitation and has been cooperative. As per nursing, patient had been refusing intermittent doses of Depakote. Psychiatry follow-up appreciated-recommend continuing Neurontin for neuropathy and mood, hydroxyzine for insomnia and Depakote ER for mood swings. Slowly improving. Psychiatry continues to follow and make medication changes. Psychiatry recommends keeping in hospital until follow-up on Monday. Psychiatry recommended renewing IVC paperwork. 2. Mild COPD exacerbation, resolved. Avoid steroids at this point given clinical improvement. Completed antibiotic course-DC 3. Mild acute diastolic heart failure.  Resolved. Continue low-dose Lasix. Echo results show reduced AF. Discussed with Dr. Willa Rough, Cardiology who reviewed the case and recommends no further workup especially given no symptoms. Patient to follow-up with primary cardiothoracic surgeon at [facility] in January. 4. Possible pneumonia. Clinically resolved. Afebrile. Completed antibiotics-DC. Follow-up chest x-ray in 4-6 weeks to ensure resolution of pneumonia findings in left base. 5. Abnormal TSH. Significance unclear. TSH 0.357 November 12, 0.053 November 28. Free T4, T3 total within normal limits November 12. Repeat T4 normal, T3 slightly low. Overall doubt clinically significant. He has no other features to suggest hyperthyroidism. He seems to be responding to Depakote. Continue treatment per psychiatry. Recommend follow-up of full TFTs in 4-6 weeks. 6. Status post aortic valve replacement in thoracic aortic aneurysm repair at [facility] September 2015 not on anticoagulation. Continue aspirin, statin. 7. Status post pacemaker placement; battery replaced September 2015 and the patient presented at [facility] with complete heart block because the battery had run out and the patient failed to follow-up.  8. Prolonged QTc. Avoid antipsychotics. Last QTC was 475 ms on 11/28    Code Status: full code DVT prophylaxis: Lovenox Family Communication: None at bedside Disposition Plan: As per psychiatry recommendations.   Consultants:  Psychiatry   Procedures:  None  Antibiotics:  Cefepime 11/28 >>11/29  Cefuroxime >  HPI/Subjective: Overnight events noted. Patient apparently was this respect for an inappropriate to staff area did this morning, and cooperative. States that he slept well last night and was feeling a little groggy this morning which she thinks may be secondary to medications.  Objective: Filed Vitals:   10/01/14 0500 10/01/14 0540 10/01/14 0845 10/01/14 1310  BP:  112/63  114/64  Pulse:  87  82  Temp:  97.4 F  (36.3 C)  98 F (36.7 C)  TempSrc:  Oral  Oral  Resp:  20  20  Height:  Weight: 72.3 kg (159 lb 6.3 oz)     SpO2:  96% 96% 100%    Intake/Output Summary (Last 24 hours) at 10/01/14 1450 Last data filed at 10/01/14 1300  Gross per 24 hour  Intake   2040 ml  Output      0 ml  Net   2040 ml     Filed Weights   09/29/14 0451 09/30/14 0450 10/01/14 0500  Weight: 71.714 kg (158 lb 1.6 oz) 71.305 kg (157 lb 3.2 oz) 72.3 kg (159 lb 6.3 oz)    Exam:    Physical Exam: Gen. exam: Pleasant middle-aged male sitting comfortably in chair this morning. Lungs: Normal respiratory effort, bilateral clear to auscultation, no crackles or wheezes.  Heart: Regular rate and rhythm, S1 and S2 normal, no murmurs, rubs auscultated. Non-telemetry. Abdomen: BS normoactive,soft,nondistended,non-tender to palpation,no organomegaly Extremities: No pretibial edema, no erythema, no cyanosis, no clubbing Neuro : Alert and oriented to time, place and person, No focal deficits   Data Reviewed: Free T4 within normal limits. T3 slightly low. The studies were just performed 17 days ago and were unremarkable.  Scheduled Meds: . albuterol  2.5 mg Nebulization QID  . antiseptic oral rinse  7 mL Mouth Rinse BID  . aspirin  81 mg Oral Daily  . atorvastatin  40 mg Oral q1800  . budesonide (PULMICORT) nebulizer solution  0.25 mg Nebulization BID  . cefUROXime  500 mg Oral BID WC  . divalproex  1,500 mg Oral QHS  . folic acid  1 mg Oral Daily  . furosemide  20 mg Oral Daily  . gabapentin  400 mg Oral TID  . hydrOXYzine  50 mg Oral QHS  . Influenza vac split quadrivalent PF  0.5 mL Intramuscular Tomorrow-1000  . LORazepam  0.5 mg Oral Once  . multivitamin with minerals  1 tablet Oral Daily  . nicotine  14 mg Transdermal Daily  . pneumococcal 23 valent vaccine  0.5 mL Intramuscular Tomorrow-1000  . polyethylene glycol  17 g Oral BID  . sodium chloride  3 mL Intravenous Q12H  . sodium chloride  3 mL  Intravenous Q12H  . tamsulosin  0.4 mg Oral QPC supper  . thiamine  100 mg Oral Daily   Or  . thiamine  100 mg Intravenous Daily   Continuous Infusions:   Principal Problem:   COPD exacerbation Active Problems:   Bipolar I disorder, single manic episode, severe, with psychosis   H/O aortic valve replacement   History of permanent cardiac pacemaker placement   SOB (shortness of breath)   HCAP (healthcare-associated pneumonia)   Acute diastolic CHF (congestive heart failure)   Manic behavior   Time spent    20 minutes  Summary: ER 11/5-11/9: IVC placed for psychosis at [facility]. Psychiatry recommended inpatient treatment. Patient with combativeness required Ativan, Haldol, Geodon, police presence and shackling to bed. [facility]  [facility]16 hospitalized for bipolar disorder, recurrent, severe, manic with psychotic features. Antipsychotics were avoided because of QT C elevation. Treated with Depakote ER 1000 mg QHS, Trazodone 50 mg QHS PRN sleep and Ativan 0.5 mg as needed to address agitation and mania.  ER 11/18 presented with chest pain. Patient left. 11/22 complaint of shortness of breath, cough, ran out of inhaler. Discharged with prescription for inhaler. 11/22 second visit same day for albuterol neb Rx. Left AMA, verbally abusive. 11/24 SOB. Left AMA. Refused interventions. 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 Treated for COPD  exacerbation   HONGALGI,ANAND, MD, FACP, FHM. [facility] Pager [phone]  If 7PM-7AM, please contact night-coverage [email or web address] Password [email or web address] 10/01/2014, 2:50 PM

## 2014-10-02 NOTE — Progress Notes (Signed)
Pt refusing to wear ID armband. Confirmed pt ID by pt stating his name and DOB.

## 2014-10-02 NOTE — Progress Notes (Signed)
PROGRESS NOTE  [name] [name] VHQ:[id number] DOB: 11/20/1945 DOA: 09/21/2014 PCP: No PCP Per Patient  Summary: 68 year old man with recurrent severe manic and psychotic features presented to the ER on 11/25 for the seventh time in the last month. He presented with IVC papers. He was noted to be delusional at hotel, displayed belligerent behavior in ED, requiring IM injection, police restraint and shackling to the bed. [facility] 11/27 Psychiatrist recommended inpatient psych treatment. He was extremely uncooperative and transferred to the emergency department for shortness of breath. There he was found to have oxygen saturation 89% on room air with diffuse wheezes. He was admitted for COPD exacerbation, possible mild CHF, possible pneumonia. His condition rapidly improved with Lasix, antibiotics and breathing treatments. He is medically clear for discharge from medical Hospital to behavioral health center.  Assessment/Plan: 1. Bipolar disorder with recurrent severe manic, psychotic features.Management per psychiatry. 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 No focal deficits noted. CT head negative 06/2014 at [facility] (no MRI/pacemaker). Patient continues to demonstrate manic symptoms but no psychosis or agitation and has been cooperative. As per nursing, patient had been refusing intermittent doses of Depakote. Psychiatry follow-up appreciated-recommend continuing Neurontin for neuropathy and mood, hydroxyzine for insomnia and Depakote ER for mood swings. Slowly improving. Psychiatry continues to follow and make medication changes. Psychiatry recommends keeping in hospital until follow-up on Monday. Psychiatry recommended renewing IVC paperwork. 2. Mild COPD exacerbation, resolved. Avoid steroids at this point given clinical improvement. Completed antibiotic course-DC 3. Mild acute diastolic heart failure.  Resolved. Continue low-dose Lasix. Echo results show reduced AF. Discussed with Dr. Willa RoughJeffrey Katz, Cardiology who reviewed the case 12/4 and recommends no further workup especially given no symptoms. Patient to follow[facility]. 4. Possible pneumonia. Clinically resolved. Afebrile. Completed antibiotics-DC. Follow-up chest x-ray in 4-6 weeks to ensure resolution of pneumonia findings in left base. 5. Abnormal TSH. Significance unclear. TSH 0.357 November 12, 0.053 November 28. Free T4, T3 total within normal limits November 12. Repeat T4 normal, T3 slightly low. Overall doubt clinically significant. He has no other features to suggest hyperthyroidism. He seems to be responding to Depakote. Continue treatment per psychiatry. Recommend follow-up of full TFTs in 4-6 weeks. 6. Status [facility] not on anticoagulation. Continue aspirin, statin. 7. Status post pacemaker placement; battery replaced September 2015 [facility] because the battery had run out and the patient failed to follow-up.  8. Prolonged QTc. Avoid antipsychotics. Last QTC was 475 ms on 11/28    Code Status: full code DVT prophylaxis: Lovenox Family Communication: None at bedside Disposition Plan: As per psychiatry recommendations.   Consultants:  Psychiatry   Procedures:  None  Antibiotics:  Cefepime 11/28 >>11/29  Cefuroxime >  HPI/Subjective: Denies complaints. No acute events.  Objective: Filed Vitals:   10/02/14 0403 10/02/14 0457 10/02/14 0523 10/02/14 0917  BP:   114/67   Pulse:   83   Temp:   98.1 F (36.7 C)   TempSrc:   Oral   Resp:   20   Height:      Weight:  72.938 kg (160 lb 12.8 oz)    SpO2: 99%  97% 98%    Intake/Output Summary (Last 24 hours) at 10/02/14 1156 Last data filed at 10/02/14 0913  Gross per 24  hour  Intake   3201 ml  Output      0 ml  Net   3201 ml     Filed Weights   09/30/14 0450 10/01/14 0500 10/02/14 0457  Weight: 71.305 kg (157 lb 3.2 oz) 72.3 kg (159 lb 6.3 oz) 72.938 kg (160 lb 12.8 oz)    Exam:    Physical Exam: Gen. exam: Pleasant middle-aged male sitting comfortably in chair this morning. Lungs: Normal respiratory effort, bilateral clear to auscultation, no crackles or wheezes.  Heart: Regular rate and rhythm, S1 and S2 normal, no murmurs, rubs auscultated. Non-telemetry. Abdomen: BS normoactive,soft,nondistended,non-tender to palpation,no organomegaly Extremities: No pretibial edema, no erythema, no cyanosis, no clubbing Neuro : Alert and oriented to time, place and person, No focal deficits   Data Reviewed: Free T4 within normal limits. T3 slightly low. The studies were just performed 17 days ago and were unremarkable.  Scheduled Meds: . albuterol  2.5 mg Nebulization QID  . antiseptic oral rinse  7 mL Mouth Rinse BID  . aspirin  81 mg Oral Daily  . atorvastatin  40 mg Oral q1800  . budesonide (PULMICORT) nebulizer solution  0.25 mg Nebulization BID  . divalproex  1,500 mg Oral QHS  . folic acid  1 mg Oral Daily  . furosemide  20 mg Oral Daily  . gabapentin  400 mg Oral TID  . hydrOXYzine  50 mg Oral QHS  . Influenza vac split quadrivalent PF  0.5 mL Intramuscular Tomorrow-1000  . multivitamin with minerals  1 tablet Oral Daily  . nicotine  14 mg Transdermal Daily  . pneumococcal 23 valent vaccine  0.5 mL Intramuscular Tomorrow-1000  . polyethylene glycol  17 g Oral BID  . sodium chloride  3 mL Intravenous Q12H  . sodium chloride  3 mL Intravenous Q12H  . tamsulosin  0.4 mg Oral QPC supper  . thiamine  100 mg Oral Daily   Or  . thiamine  100 mg Intravenous Daily   Continuous Infusions:   Principal Problem:   COPD exacerbation Active Problems:   Bipolar I disorder, single manic episode, severe, with psychosis   H/O aortic valve  replacement   History of permanent cardiac pacemaker placement   SOB (shortness of breath)   HCAP (healthcare-associated pneumonia)   Acute diastolic CHF (congestive heart failure)   Manic behavior   Time spent    20 minutes  Summary: ER 11/5-11/9: [facility] placed for psychosis at [facility]. Psychiatry recommended inpatient treatment. Patient with combativeness required Ativan, Haldol, Geodon, police presence and shackling to bed. [facility]  [facility]  [facility]11/16 hospitalized for bipolar disorder, recurrent, severe, manic with psychotic features. Antipsychotics were avoided because of QT C elevation. Treated with Depakote ER 1000 mg QHS, Trazodone 50 mg QHS PRN sleep and Ativan 0.5 mg as needed to address agitation and mania.  ER 11/18 presented with chest pain. Patient left. 11/22 complaint of shortness of breath, cough, ran out of inhaler. Discharged with prescription for inhaler. 11/22 second visit same day for albuterol neb Rx. Left AMA, verbally abusive. 11/24 SOB. Left AMA. Refused interventions. 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 Treated for COPD exacerbation   Rory Xiang, MD, FACP, FHM. [facility] Pager [phone]  If 7PM-7AM, please contact night-coverage [email or web address] Password [email or web address] 10/02/2014, 11:56 AM

## 2014-10-03 LAB — COMPREHENSIVE METABOLIC PANEL
ALT: 22 U/L (ref 0–53)
AST: 21 U/L (ref 0–37)
Albumin: 3 g/dL — ABNORMAL LOW (ref 3.5–5.2)
Alkaline Phosphatase: 83 U/L (ref 39–117)
Anion gap: 13 (ref 5–15)
BUN: 13 mg/dL (ref 6–23)
CO2: 29 mEq/L (ref 19–32)
Calcium: 8.9 mg/dL (ref 8.4–10.5)
Chloride: 98 mEq/L (ref 96–112)
Creatinine, Ser: 0.73 mg/dL (ref 0.50–1.35)
GFR calc Af Amer: >90 mL/min (ref >90)
GFR calc non Af Amer: >90 mL/min (ref >90)
Glucose, Bld: 89 mg/dL (ref 70–99)
Potassium: 5.2 mEq/L (ref 3.7–5.3)
Sodium: 140 mEq/L (ref 137–147)
Total Bilirubin: 0.3 mg/dL (ref 0.3–1.2)
Total Protein: 6.7 g/dL (ref 6.0–8.3)

## 2014-10-03 LAB — VALPROIC ACID LEVEL: Valproic Acid Lvl: 78.5 ug/mL (ref 50.0–100.0)

## 2014-10-03 MED ORDER — OXYCODONE HCL 5 MG PO TABS
5.0000 mg | ORAL_TABLET | ORAL | Status: AC | PRN
  Administered 2014-10-04 (×2): 5 mg via ORAL
  Filled 2014-10-03 (×2): qty 1

## 2014-10-03 MED ORDER — BUTALBITAL-APAP-CAFFEINE 50-325-40 MG PO TABS
1.0000 | ORAL_TABLET | ORAL | Status: DC | PRN
  Administered 2014-10-03: 1 via ORAL
  Filled 2014-10-03: qty 1

## 2014-10-03 MED ORDER — OXYCODONE HCL 5 MG PO TABS
5.0000 mg | ORAL_TABLET | ORAL | Status: AC | PRN
  Administered 2014-10-03 (×2): 5 mg via ORAL
  Filled 2014-10-03 (×2): qty 1

## 2014-10-03 NOTE — Consult Note (Signed)
Psychiatry Consult follow up note  Reason for Consult:  Medication management for bipolar disorder and disposition plans Referring Physician:  Samuella Cota, MD  [name] [name] is an 68 y.o. male. Total Time spent with patient: 20 minutes  Assessment: AXIS I:  Bipolar disorder, most recent episode is mania AXIS II:  Deferred AXIS III:   Past Medical History  Diagnosis Date  . 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 COPD (chronic obstructive pulmonary disease)   . Thoracic ascending aortic aneurysm     repaired 06/2014 at [facility]  . COPD (chronic obstructive pulmonary disease)   . BPH (benign prostatic hyperplasia)   . Pacemaker     battery replaced 06/2014 at [facility]  . SSS (sick sinus syndrome)     s/p PPM   . CAD (coronary artery disease)     ?MI in 2007  . Pneumothorax     traumatic 2007  . Anxiety and depression   . Psychosis   . Bipolar disorder    AXIS IV:  economic problems, other psychosocial or environmental problems and problems related to social environment AXIS V:  21-30 behavior considerably influenced by delusions or hallucinations OR serious impairment in judgment, communication OR inability to function in almost all areas  Plan:  Case discussed with Radene Ou, LCSW Patient has involuntary commitment to avoid pre-maturely leaving the hospital  Continue Neurontin 400 mg PO TID for neuropathy and mood  Continue Hydroxyzine 50 mg PO Qhs for insomnia Continue Depakote ER 500 mg 3 tablet at bed time for mood swings Continue Ativan PRN for anxiety Checked Valproic acid level and competency metabolic panel - which are in therapeutic range  Recommend psychiatric Inpatient admission when medically cleared. Supportive therapy provided about ongoing stressors.  Appreciate psychiatric consultation and follow up tomorrow Please contact [phone] or [phone] if needs further assistance   Subjective:   [name] [name] is  a 68 y.o. male patient admitted with grandiose delusions and bizarre behavior.  HPI: [name] [name] is an 68 y.o. male, Caucasian seen and chart reviewed for psychiatric consultation and evaluation of bipolar disorder and medication management. He was admitted to medical floor for medical stabilization of sob due to COPD and Pneumonia. Patient has been suffering with increased irritability, pressured speech, racing thoughts, grandiose delusions and inappropriate and bizarre behaviors. His antipsychotic medication seroquel was discontinued during his recent admission at [facility] due to cardiac structural abnormality and repair of valve in the recent past. He was treated with depakote which he was non compliant while in out patient care. Patient denied suicidal or homicidal ideations, intention or plans. He was recently admitted to [facility] (from 09/07/14-09/12/14) due to aggressiveness and delusional thoughts but has not been taking his prescribed medications or keep his follow up appointment. Patient has no insight into his problem and has been exercising poor judgment.   Interval history: Patient was seen spending more than a hour on phone and told me that he has been talking with Publishing rights manager and staff reported that he has been irritable and occasionally agitated. He has been compliant with his medication and his valproic acid level is therapeutic level. Patient has been tolerating his medication without significant side effect of the medications. Patient has limited psychosocial support and living arrangements. Patient continued to be showing symptoms of mania and he is known noncompliant with his medication management. Reportedly he has multiple charges  for trespassing and restraining orders from his son. He has charges pending for probation violation. Patient will be dangerous to himself and others if he was not treated adequately. Patient has been taking his medication but  continued to be with manic symptoms. Patient has grandiose thoughts, pressured speech, but not irritable during this visit. He has less tangentiality, circumstantiality and bizarre behaviors. Patient has a fair insight, judgment and impulse control.   Past Psychiatric History: Past Medical History  Diagnosis Date  . Severe aortic valve stenosis     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 at [facility]  . COPD (chronic obstructive pulmonary disease)   . Thoracic ascending aortic aneurysm     repaired 06/2014 at [facility]  . COPD (chronic obstructive pulmonary disease)   . BPH (benign prostatic hyperplasia)   . Pacemaker     battery replaced 06/2014 at [facility]  . SSS (sick sinus syndrome)     s/p PPM   . CAD (coronary artery disease)     ?MI in 2007  . Pneumothorax     traumatic 2007  . Anxiety and depression   . Psychosis   . Bipolar disorder     reports that he has quit smoking. His smoking use included Cigarettes. He smoked 1.00 pack per day. He does not have any smokeless tobacco history on file. He reports that he drinks alcohol. He reports that he uses illicit drugs (Marijuana). Family History  Problem Relation Age of Onset  . CAD Father   . Diabetes Father   . High blood pressure Brother   . Cancer Brother    Family History Substance Abuse:  (Unable to assess/Pt refused to participate) Family Supports: Yes, List: (Two sons. Akeen Ledyard is his POA.)) Living Arrangements: Alone Can pt return to current living arrangement?: No Abuse/Neglect [facility]) Physical Abuse: Denies Verbal Abuse: Denies Sexual Abuse: Denies Allergies:   Allergies  Allergen Reactions  . Tiotropium Bromide Monohydrate Nausea Only and Anaphylaxis    Tremors    ACT Assessment Complete:  Yes:    Educational Status    Risk to Self: Risk to self with the past 6 months Suicidal Ideation: No (Unable to assess/Pt refused to participate) Suicidal Intent: No (Unable to assess/Pt refused to participate) Is patient at  risk for suicide?: No (Unable to assess/Pt refused to participate) Suicidal Plan?: No (Unable to assess/Pt refused to participate) Access to Means: No (Unable to assess/Pt refused to participate) What has been your use of drugs/alcohol within the last 12 months?: Pt uses marijuana regularly Previous Attempts/Gestures: No (Unable to assess/Pt refused to participate) How many times?: 0 Other Self Harm Risks: None identified (Unable to assess/Pt refused to participate) Triggers for Past Attempts: None known Intentional Self Injurious Behavior: None (Unable to assess/Pt refused to participate) Family Suicide History: Unable to assess Recent stressful life event(s): Other (Comment) (Unable to assess/Pt refused to participate) Persecutory voices/beliefs?: No (Unable to assess/Pt refused to participate) Depression: No (Unable to assess/Pt refused to participate) Depression Symptoms: Feeling angry/irritable Substance abuse history and/or treatment for substance abuse?: Yes Suicide prevention information given to non-admitted patients: Not applicable  Risk to Others: Risk to Others within the past 6 months Homicidal Ideation: No (Unable to assess/Pt refused to participate) Thoughts of Harm to Others: No (Unable to assess/Pt refused to participate) Current Homicidal Intent: No (Unable to assess/Pt refused to participate) Current Homicidal Plan: No (Unable to assess/Pt refused to participate) Access to Homicidal Means: No (Unable to assess/Pt refused to participate) Identified Victim: Unable to  assess/Pt refused to participate History of harm to others?: No (Unable to assess/Pt refused to participate) Assessment of Violence: None Noted (Unable to assess/Pt refused to participate) Violent Behavior Description: Unable to assess/Pt refused to participate Does patient have access to weapons?: No (Unable to assess/Pt refused to participate) Criminal Charges Pending?: No (Unable to assess/Pt refused to  participate) Does patient have a court date: No (Unable to assess/Pt refused to participate)  Abuse: Abuse/Neglect Assessment (Assessment to be complete while patient is alone) Physical Abuse: Denies Verbal Abuse: Denies Sexual Abuse: Denies Exploitation of patient/patient's resources: Denies Self-Neglect: Denies  Prior Inpatient Therapy: Prior Inpatient Therapy Prior Inpatient Therapy: Yes Prior Therapy Dates: In the last month Prior Therapy Facilty/Provider(s): [facility] in [place] Reason for Treatment: inpt mental health- psychosis  Prior Outpatient Therapy: Prior Outpatient Therapy Prior Outpatient Therapy: No Prior Therapy Dates: Unknown Prior Therapy Facilty/Provider(s): Unknown Reason for Treatment: Unknown  Additional Information: Additional Information 1:1 In Past 12 Months?: No CIRT Risk: No Elopement Risk: No Does patient have medical clearance?: Yes   Objective: Blood pressure 118/89, pulse 79, temperature 98.3 F (36.8 C), temperature source Oral, resp. rate 20, height _0  (1.803 m), weight 74.163 kg (163 lb 8 oz), SpO2 96 %.Body mass index is 22.81 kg/(m^2). Results for orders placed or performed during the hospital encounter of 09/21/14 (from the past 72 hour(s))  Valproic acid level     Status: None   Collection Time: 10/03/14  7:50 AM  Result Value Ref Range   Valproic Acid Lvl 78.5 50.0 - 100.0 ug/mL    Comment: Performed at [facility]  Comprehensive metabolic panel     Status: Abnormal   Collection Time: 10/03/14  7:50 AM  Result Value Ref Range   Sodium 140 137 - 147 mEq/L   Potassium 5.2 3.7 - 5.3 mEq/L   Chloride 98 96 - 112 mEq/L   CO2 29 19 - 32 mEq/L   Glucose, Bld 89 70 - 99 mg/dL   BUN 13 6 - 23 mg/dL   Creatinine, Ser 0.73 0.50 - 1.35 mg/dL   Calcium 8.9 8.4 - 10.5 mg/dL   Total Protein 6.7 6.0 - 8.3 g/dL   Albumin 3.0 (L) 3.5 - 5.2 g/dL   AST 21 0 - 37 U/L   ALT 22 0 - 53 U/L   Alkaline Phosphatase 83 39 - 117 U/L    Total Bilirubin 0.3 0.3 - 1.2 mg/dL   GFR calc non Af Amer >90 >90 mL/min   GFR calc Af Amer >90 >90 mL/min    Comment: (NOTE) The eGFR has been calculated using the CKD EPI equation. This calculation has not been validated in all clinical situations. eGFR's persistently <90 mL/min signify possible Chronic Kidney Disease.    Anion gap 13 5 - 15   Labs are reviewed and are pertinent for the above.  Current Facility-Administered Medications  Medication Dose Route Frequency Provider Last Rate Last Dose  . acetaminophen (TYLENOL) tablet 650 mg  650 mg Oral Q6H PRN Rise Patience, MD   650 mg at 10/03/14 0417   Or  . acetaminophen (TYLENOL) suppository 650 mg  650 mg Rectal Q6H PRN Rise Patience, MD      . albuterol (PROVENTIL) (2.5 MG/3ML) 0.083% nebulizer solution 2.5 mg  2.5 mg Nebulization Q2H PRN Rise Patience, MD   2.5 mg at 10/03/14 0436  . albuterol (PROVENTIL) (2.5 MG/3ML) 0.083% nebulizer solution 2.5 mg  2.5 mg Nebulization QID Frederich Chick S  Lama, MD   2.5 mg at 10/03/14 0930  . antiseptic oral rinse (CPC / CETYLPYRIDINIUM CHLORIDE 0.05%) solution 7 mL  7 mL Mouth Rinse BID Samuella Cota, MD   7 mL at 10/02/14 1000  . aspirin chewable tablet 81 mg  81 mg Oral Daily Mariea Clonts, MD   81 mg at 10/03/14 7017  . atorvastatin (LIPITOR) tablet 40 mg  40 mg Oral q1800 Mojeed Akintayo   40 mg at 10/02/14 1740  . budesonide (PULMICORT) nebulizer solution 0.25 mg  0.25 mg Nebulization BID Rise Patience, MD   0.25 mg at 10/03/14 0930  . divalproex (DEPAKOTE ER) 24 hr tablet 1,500 mg  1,500 mg Oral QHS Durward Parcel, MD   1,500 mg at 10/02/14 2105  . folic acid (FOLVITE) tablet 1 mg  1 mg Oral Daily Rise Patience, MD   1 mg at 10/03/14 0926  . gabapentin (NEURONTIN) capsule 400 mg  400 mg Oral TID Durward Parcel, MD   400 mg at 10/03/14 0926  . hydrOXYzine (ATARAX/VISTARIL) tablet 50 mg  50 mg Oral QHS Durward Parcel, MD   50 mg at  10/02/14 2105  . Influenza vac split quadrivalent PF (FLUARIX) injection 0.5 mL  0.5 mL Intramuscular Tomorrow-1000 Rise Patience, MD   0.5 mL at 09/25/14 1000  . LORazepam (ATIVAN) tablet 0.5 mg  0.5 mg Oral TID PRN Gardiner Barefoot, NP   0.5 mg at 10/01/14 0557   Or  . LORazepam (ATIVAN) injection 0.5 mg  0.5 mg Intramuscular TID PRN Gardiner Barefoot, NP      . multivitamin with minerals tablet 1 tablet  1 tablet Oral Daily Rise Patience, MD   1 tablet at 10/03/14 0926  . nicotine (NICODERM CQ - dosed in mg/24 hours) patch 14 mg  14 mg Transdermal Daily Samuella Cota, MD   14 mg at 10/01/14 1050  . pneumococcal 23 valent vaccine (PNU-IMMUNE) injection 0.5 mL  0.5 mL Intramuscular Tomorrow-1000 Rise Patience, MD   0.5 mL at 09/25/14 1000  . polyethylene glycol (MIRALAX / GLYCOLAX) packet 17 g  17 g Oral BID Samuella Cota, MD   17 g at 10/02/14 1032  . sodium chloride 0.9 % injection 3 mL  3 mL Intravenous Q12H Rise Patience, MD   3 mL at 10/01/14 1000  . sodium chloride 0.9 % injection 3 mL  3 mL Intravenous Q12H Rise Patience, MD   3 mL at 09/24/14 2136  . tamsulosin (FLOMAX) capsule 0.4 mg  0.4 mg Oral QPC supper Mojeed Akintayo   0.4 mg at 10/02/14 1740  . thiamine (VITAMIN B-1) tablet 100 mg  100 mg Oral Daily Rise Patience, MD   100 mg at 10/03/14 7939   Or  . thiamine (B-1) injection 100 mg  100 mg Intravenous Daily Rise Patience, MD        Psychiatric Specialty Exam:     Blood pressure 118/89, pulse 79, temperature 98.3 F (36.8 C), temperature source Oral, resp. rate 20, height _0  (1.803 m), weight 74.163 kg (163 lb 8 oz), SpO2 96 %.Body mass index is 22.81 kg/(m^2).  General Appearance: Disheveled  Eye Contact::  Minimal  Speech:  Garbled  Volume:  Increased  Mood:  Angry and Irritable  Affect:  Labile  Thought Process:  Circumstantial and Disorganized  Orientation:  Full (Time, Place, and Person)  Thought Content:   Delusions and grandiose  Suicidal Thoughts:  No  Homicidal Thoughts:  No  Memory:  Immediate;   Fair Recent;   Fair Remote;   Fair  Judgement:  Impaired  Insight:  Lacking  Psychomotor Activity:  Increased  Concentration:  Poor  Recall:  [place]: Good  Akathisia:  No  Handed:  Right  AIMS (if indicated):     Assets:  Communication Skills  Sleep:   poor   Musculoskeletal: Strength & Muscle Tone: within normal limits Gait & Station: normal Patient leans: N/A  Treatment Plan Summary: Daily contact with patient to assess and evaluate symptoms and progress in treatment Medication management  Encouraged compliant with his medication management Continue Depakote ER 1500 mg PO Qhs for bipolar mania  Checked valproic acid level and CMP which are therapeutic  Continue neurontin 400 mg PO TID for neuropathy and mood  Continue Hydroxyzine 50 mg PO Qhs for insomnia  [name] [name],JANARDHAHA R., MD 10/03/2014 10:25 AM

## 2014-10-03 NOTE — Progress Notes (Addendum)
PROGRESS NOTE  [name] [name] ZOX:[id number] DOB: 1946/10/10 DOA: 09/21/2014 PCP: No PCP Per Patient  Summary: 68 year old man with recurrent severe manic and psychotic features presented to the ER on 11/25 for the seventh time in the last month. He presented with IVC papers. He was noted to be delusional at hotel, displayed belligerent behavior in ED, requiring IM injection, police restraint and shackling to the bed. Transferred to [facility] ED. 11/27 Psychiatrist recommended inpatient psych treatment. He was extremely uncooperative and transferred to the emergency department for shortness of breath. There he was found to have oxygen saturation 89% on room air with diffuse wheezes. He was admitted for COPD exacerbation, possible mild CHF, possible pneumonia. His condition rapidly improved with Lasix, antibiotics and breathing treatments. He is medically clear for discharge from medical Hospital to inpatient psychiatry when bed available and as per psychiatry recommendations.  Assessment/Plan: 1. Bipolar disorder with recurrent severe manic, psychotic features.Management per psychiatry. 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 No focal deficits noted. CT head negative 06/2014 at [facility] (no MRI/pacemaker). Patient continues to demonstrate manic symptoms but no psychosis or agitation and has been cooperative. As per nursing, patient had been refusing intermittent doses of Depakote. Psychiatry follow-up appreciated-recommend continuing Neurontin for neuropathy and mood, hydroxyzine for insomnia and Depakote ER for mood swings. Slowly improving. Psychiatry continues to follow and make medication changes. Psychiatry recommends keeping in hospital until follow-up on Monday. Psychiatry recommended renewing IVC paperwork. As per psychiatry social worker, no bed offers for inpatient psychiatry admission. Discussed with Psychiatrist: patient  not medically stable for DC home. CMP unremarkable except high normal K- will repeat in am. Valproate level therapeutic. 2. Mild COPD exacerbation, resolved. Avoid steroids at this point given clinical improvement. Completed antibiotic course-DC 3. Mild acute diastolic heart failure. Resolved. Echo results show reduced AF. Discussed with Dr. Willa Rough, Cardiology who reviewed the case 12/4 and recommends no further workup especially given no symptoms. Patient to follow-up with primary cardiothoracic surgeon at [facility] in January. Discontinued Lasix and follow clinically. 4. Possible pneumonia. Clinically resolved. Afebrile. Completed antibiotics-DC. Follow-up chest x-ray in 4-6 weeks to ensure resolution of pneumonia findings in left base. 5. Abnormal TSH. Significance unclear. TSH 0.357 November 12, 0.053 November 28. Free T4, T3 total within normal limits November 12. Repeat T4 normal, T3 slightly low. Overall doubt clinically significant. He has no other features to suggest hyperthyroidism. He seems to be responding to Depakote. Continue treatment per psychiatry. Recommend follow-up of full TFTs in 4-6 weeks. 6. Status post aortic valve replacement in thoracic aortic aneurysm repair at [facility] September 2015 not on anticoagulation. Continue aspirin, statin. 7. Status post pacemaker placement; battery replaced September 2015 and the patient presented at [facility] with complete heart block because the battery had run out and the patient failed to follow-up.  8. Prolonged QTc. Avoid antipsychotics. Last QTC was 475 ms on 11/28    Code Status: full code DVT prophylaxis: Lovenox Family Communication: None at bedside Disposition Plan: As per psychiatry recommendations.   Consultants:  Psychiatry   Procedures:  None  Antibiotics:  Cefepime 11/28 >>11/29  Cefuroxime > DC'd  HPI/Subjective: Denies complaints.   Objective: Filed Vitals:   10/02/14 2012 10/02/14 2105 10/03/14 0524  10/03/14 0932  BP:  102/61 118/89   Pulse:  86 79   Temp:  98.2 F (36.8 C) 98.3 F (36.8 C)   TempSrc:  Oral Oral   Resp:  20 20   Height:      Weight:   74.163 kg (163 lb 8 oz)   SpO2: 98% 96% 95% 96%    Intake/Output Summary (Last 24 hours) at 10/03/14 1339 Last data filed at 10/03/14 1219  Gross per 24 hour  Intake    600 ml  Output      0 ml  Net    600 ml     Filed Weights   10/01/14 0500 10/02/14 0457 10/03/14 0524  Weight: 72.3 kg (159 lb 6.3 oz) 72.938 kg (160 lb 12.8 oz) 74.163 kg (163 lb 8 oz)    Exam:    Physical Exam: Gen. exam: Pleasant middle-aged male ambulating comfortably in the room. Lungs: Normal respiratory effort, bilateral clear to auscultation, no crackles or wheezes.  Heart: Regular rate and rhythm, S1 and S2 normal, no murmurs, rubs auscultated. Non-telemetry. Abdomen: BS normoactive,soft,nondistended,non-tender to palpation,no organomegaly Extremities: No pretibial edema, no erythema, no cyanosis, no clubbing Neuro : Alert and oriented to time, place and person, No focal deficits   Data Reviewed: Free T4 within normal limits. T3 slightly low. The studies were just performed 17 days ago and were unremarkable.  Scheduled Meds: . albuterol  2.5 mg Nebulization QID  . antiseptic oral rinse  7 mL Mouth Rinse BID  . aspirin  81 mg Oral Daily  . atorvastatin  40 mg Oral q1800  . budesonide (PULMICORT) nebulizer solution  0.25 mg Nebulization BID  . divalproex  1,500 mg Oral QHS  . folic acid  1 mg Oral Daily  . gabapentin  400 mg Oral TID  . hydrOXYzine  50 mg Oral QHS  . Influenza vac split quadrivalent PF  0.5 mL Intramuscular Tomorrow-1000  . multivitamin with minerals  1 tablet Oral Daily  . nicotine  14 mg Transdermal Daily  . pneumococcal 23 valent vaccine  0.5 mL Intramuscular Tomorrow-1000  . polyethylene glycol  17 g Oral BID  . sodium chloride  3 mL Intravenous Q12H  . sodium chloride  3 mL Intravenous Q12H  . tamsulosin  0.4 mg  Oral QPC supper  . thiamine  100 mg Oral Daily   Or  . thiamine  100 mg Intravenous Daily   Continuous Infusions:   Principal Problem:   COPD exacerbation Active Problems:   Bipolar I disorder, single manic episode, severe, with psychosis   H/O aortic valve replacement   History of permanent cardiac pacemaker placement   SOB (shortness of breath)   HCAP (healthcare-associated pneumonia)   Acute diastolic CHF (congestive heart failure)   Manic behavior   Time spent    20 minutes  Summary: ER 11/5-11/9: IVC placed for psychosis at [facility]. Psychiatry recommended inpatient treatment. Patient with combativeness required Ativan, Haldol, Geodon, police presence and shackling to bed. [facility]  11/9-11/16 hospitalized for bipolar disorder, recurrent, severe, manic with psychotic features. Antipsychotics were avoided because of QT C elevation. Treated with Depakote ER 1000 mg QHS, Trazodone 50 mg QHS PRN sleep and Ativan 0.5 mg as needed to address agitation and mania.  ER 11/18 presented with chest pain. Patient left. 11/22 complaint of shortness of breath, cough, ran out of inhaler. Discharged with prescription for inhaler. 11/22 second visit same day for albuterol neb Rx. Left AMA, verbally abusive. 11/24 SOB. Left AMA. Refused interventions. 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 Treated for COPD exacerbation   Stiles Maxcy, MD, FACP, FHM. [facility] Pager [phone]  If 7PM-7AM,  please contact night-coverage [email or web address] Password [email or web address] 10/03/2014, 1:39 PM

## 2014-10-03 NOTE — Progress Notes (Signed)
Clinical Social Work  CSW continues to follow patient for inpatient placement. At this time, [facility] but [facility] been [facility] by several facilities. CSW will continue to staff case with psych MD and assist as needed. CSW attempted to meet with patient throughout the day but patient on the phone and asked CSW to follow up at later time.  [name] [name], [place] [phone]

## 2014-10-03 NOTE — Plan of Care (Signed)
Problem: Phase III Progression Outcomes Goal: Discharge plan remains appropriate-arrangements made Outcome: Completed/Met Date Met:  10/03/14 Goal: Other Phase III Outcomes/Goals Outcome: Completed/Met Date Met:  10/03/14  Problem: Discharge Progression Outcomes Goal: Pain controlled with appropriate interventions Outcome: Completed/Met Date Met:  10/03/14 Goal: Hemodynamically stable Outcome: Completed/Met Date Met:  10/03/14

## 2014-10-04 LAB — BASIC METABOLIC PANEL
Anion gap: 9 (ref 5–15)
BUN: 14 mg/dL (ref 6–23)
CO2: 32 mEq/L (ref 19–32)
Calcium: 9.1 mg/dL (ref 8.4–10.5)
Chloride: 93 mEq/L — ABNORMAL LOW (ref 96–112)
Creatinine, Ser: 0.84 mg/dL (ref 0.50–1.35)
GFR calc Af Amer: >90 mL/min (ref >90)
GFR calc non Af Amer: 88 mL/min — ABNORMAL LOW (ref >90)
Glucose, Bld: 75 mg/dL (ref 70–99)
Potassium: 4.6 mEq/L (ref 3.7–5.3)
Sodium: 134 mEq/L — ABNORMAL LOW (ref 137–147)

## 2014-10-04 MED ORDER — TAMSULOSIN HCL 0.4 MG PO CAPS: 0.4000 mg | ORAL_CAPSULE | Freq: Every day | ORAL | Status: DC

## 2014-10-04 MED ORDER — HYDROXYZINE HCL 50 MG PO TABS: 50.0000 mg | ORAL_TABLET | Freq: Every day | ORAL | Status: DC

## 2014-10-04 MED ORDER — NICOTINE 14 MG/24HR TD PT24: 14.0000 mg | MEDICATED_PATCH | Freq: Every day | TRANSDERMAL | Status: DC

## 2014-10-04 MED ORDER — ATORVASTATIN CALCIUM 40 MG PO TABS: 40.0000 mg | ORAL_TABLET | Freq: Every day | ORAL | Status: DC

## 2014-10-04 MED ORDER — THIAMINE HCL 100 MG PO TABS: 100.0000 mg | ORAL_TABLET | Freq: Every day | ORAL | Status: DC

## 2014-10-04 MED ORDER — DIVALPROEX SODIUM ER 500 MG PO TB24: 1500.0000 mg | ORAL_TABLET | Freq: Every day | ORAL | Status: DC

## 2014-10-04 MED ORDER — FOLIC ACID 1 MG PO TABS: 1.0000 mg | ORAL_TABLET | Freq: Every day | ORAL | Status: DC

## 2014-10-04 MED ORDER — GABAPENTIN 400 MG PO CAPS: 400.0000 mg | ORAL_CAPSULE | Freq: Three times a day (TID) | ORAL | Status: DC

## 2014-10-04 NOTE — Progress Notes (Signed)
Clinical Social Work  CSW spoke with psych MD who reports patient has therapeutic levels and has been compliant with medications. Psych MD reports patient is stable to DC back home. Psych MD signed Notice of Commitment Change form to rescind IVC. CSW faxed form to Magistrate and placed original copy in chart.  CSW spoke with patient about having follow up in the community at DC. Patient reports he is not happy with DSS MH worker and would prefer follow up at another agency. CSW scheduled follow up appointment at [facility] in [place] for January 20th 2016 at 8:15am. Follow up appointment also placed on AVS.  Patient reports he is happy to DC. CSW explained that attending MD would make determination on DC date but that patient was cleared by psych MD to DC when medically stable. Patient reports he will get a friend to transport him home or will call a cab and reports he has money for cab fare. Patient reports no further needs and reports understanding with the importance of following up in the community with psychiatrist.   CSW updated RN and attending MD of DC plans.   [name], [place] [phone]

## 2014-10-04 NOTE — Consult Note (Signed)
Psychiatry Consult follow up note  Reason for Consult:  Medication management for bipolar disorder and disposition plans Referring Physician:  Dr. Vida Roller is an 68 y.o. male. Total Time spent with patient: 20 minutes  Assessment: AXIS I:  Bipolar disorder, most recent episode is mania AXIS II:  Deferred AXIS III:   Past Medical History  Diagnosis Date  . 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 COPD (chronic obstructive pulmonary disease)   . Thoracic ascending aortic aneurysm     repaired 06/2014 at [facility]  . COPD (chronic obstructive pulmonary disease)   . BPH (benign prostatic hyperplasia)   . Pacemaker     battery replaced 06/2014 at [facility]  . SSS (sick sinus syndrome)     s/p PPM   . CAD (coronary artery disease)     ?MI in 2007  . Pneumothorax     traumatic 2007  . Anxiety and depression   . Psychosis   . 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   Patient does not meet criteria for psychiatric inpatient admission. Supportive therapy provided about ongoing stressors.  Appreciate psychiatric consultation and will sign off  Please contact [phone] or [phone] if needs further assistance   Subjective:   [name] [name] is a 68 y.o. male patient admitted with grandiose delusions and bizarre  behavior.  HPI: [name] [name] is an 68 y.o. male, Caucasian seen and chart reviewed for psychiatric consultation and evaluation of bipolar disorder and medication management. He was admitted to medical floor for medical stabilization of sob due to COPD and Pneumonia. Patient has been suffering with increased irritability, pressured speech, racing thoughts, grandiose delusions and inappropriate and bizarre behaviors. His antipsychotic medication seroquel was discontinued during his recent admission at [facility] due to cardiac structural abnormality and repair of valve in the recent past. He was treated with depakote which he was non compliant while in out patient care. Patient denied suicidal or homicidal ideations, intention or plans. He was recently admitted to [facility] (from 09/07/14-09/12/14) due to aggressiveness and delusional thoughts but has not been taking his prescribed medications or keep his follow up appointment. Patient has no insight into his problem and has been exercising poor judgment.   Interval history: Patient was seen for psych consultation. Patient has been compliant with his medication management and has no reported adverse effects. Patient stated that he is able address his [facility] insurance and able to save some premium during yesterday phone conversation. Patient has no irritability, agitation or aggressive behaviors. He has future plans of active participation in out patient medication management from Dr. Katrinka Blazing and also with mental health provider. He has plans to go back to his room with cab services. He reported having enough funds in his account. Patient has been tolerating his  medication without significant side effect of the medications. Patient continued to have hypomanic symptoms which he seems to be enjoying and possibly it may be his base line. Patient has no suicide or homicide ideation, intention or psychosis. He has normal rate, rhythm and volume of speech  and linear and goal directed thought process and has some what grandiose and talks about spending money and donating etc. He has no apparent bizarre behaviors. Patient has no suicidal or homicidal ideation, intention or plans. He does not meet criteria for acute psych hospitalization and no safety concerns.   Past Psychiatric History: Past Medical History  Diagnosis Date  . 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 COPD (chronic obstructive pulmonary disease)   . Thoracic ascending aortic aneurysm     repaired 06/2014 at [facility]  . COPD (chronic obstructive pulmonary disease)   . BPH (benign prostatic hyperplasia)   . Pacemaker     battery replaced 06/2014 at [facility]  . SSS (sick sinus syndrome)     s/p PPM   . CAD (coronary artery disease)     ?MI in 2007  . Pneumothorax     traumatic 2007  . Anxiety and depression   . Psychosis   . Bipolar disorder     reports that he has quit smoking. His smoking use included Cigarettes. He smoked 1.00 pack per day. He does not have any smokeless tobacco history on file. He reports that he drinks alcohol. He reports that he uses illicit drugs (Marijuana). Family History  Problem Relation Age of Onset  . CAD Father   . Diabetes Father   . High blood pressure Brother   . Cancer Brother    Family History Substance Abuse:  (Unable to assess/Pt refused to participate) Family Supports: Yes, List: (Two sons. [name] [name] is his POA.)) Living Arrangements: Alone Can pt return to current living arrangement?: No Abuse/Neglect [facility]) Physical Abuse: Denies Verbal Abuse: Denies Sexual Abuse: Denies Allergies:   Allergies  Allergen Reactions  . Tiotropium Bromide Monohydrate Nausea Only and Anaphylaxis    Tremors    ACT Assessment Complete:  Yes:    Educational Status    Risk to Self: Risk to self with the past 6 months Suicidal Ideation: No (Unable to assess/Pt refused to participate) Suicidal Intent: No  (Unable to assess/Pt refused to participate) Is patient at risk for suicide?: No (Unable to assess/Pt refused to participate) Suicidal Plan?: No (Unable to assess/Pt refused to participate) Access to Means: No (Unable to assess/Pt refused to participate) What has been your use of drugs/alcohol within the last 12 months?: Pt uses marijuana regularly Previous Attempts/Gestures: No (Unable to assess/Pt refused to participate) How many times?: 0 Other Self Harm Risks: None identified (Unable to assess/Pt refused to participate) Triggers for Past Attempts: None known Intentional Self Injurious Behavior: None (Unable to assess/Pt refused to participate) Family Suicide History: Unable to assess Recent stressful life event(s): Other (Comment) (Unable to assess/Pt refused to participate) Persecutory voices/beliefs?: No (Unable to assess/Pt refused to participate) Depression: No (Unable to assess/Pt refused to participate) Depression Symptoms: Feeling angry/irritable Substance abuse history and/or treatment for substance abuse?: Yes Suicide prevention information given to non-admitted patients: Not applicable  Risk to Others: Risk to Others within the past 6 months Homicidal Ideation: No (Unable to assess/Pt refused to participate) Thoughts of Harm to Others: No (Unable to assess/Pt refused to participate) Current Homicidal Intent: No (Unable to assess/Pt refused to participate)  Current Homicidal Plan: No (Unable to assess/Pt refused to participate) Access to Homicidal Means: No (Unable to assess/Pt refused to participate) Identified Victim: Unable to assess/Pt refused to participate History of harm to others?: No (Unable to assess/Pt refused to participate) Assessment of Violence: None Noted (Unable to assess/Pt refused to participate) Violent Behavior Description: Unable to assess/Pt refused to participate Does patient have access to weapons?: No (Unable to assess/Pt refused to  participate) Criminal Charges Pending?: No (Unable to assess/Pt refused to participate) Does patient have a court date: No (Unable to assess/Pt refused to participate)  Abuse: Abuse/Neglect Assessment (Assessment to be complete while patient is alone) Physical Abuse: Denies Verbal Abuse: Denies Sexual Abuse: Denies Exploitation of patient/patient's resources: Denies Self-Neglect: Denies  Prior Inpatient Therapy: Prior Inpatient Therapy Prior Inpatient Therapy: Yes Prior Therapy Dates: In the last month Prior Therapy Facilty/Provider(s): [facility] in [place] Reason for Treatment: inpt mental health- psychosis  Prior Outpatient Therapy: Prior Outpatient Therapy Prior Outpatient Therapy: No Prior Therapy Dates: Unknown Prior Therapy Facilty/Provider(s): Unknown Reason for Treatment: Unknown  Additional Information: Additional Information 1:1 In Past 12 Months?: No CIRT Risk: No Elopement Risk: No Does patient have medical clearance?: Yes   Objective: Blood pressure 112/62, pulse 78, temperature 98.1 F (36.7 C), temperature source Oral, resp. rate 20, height 5\' 11"  (1.803 m), weight 76.295 kg (168 lb 3.2 oz), SpO2 97 %.Body mass index is 23.47 kg/(m^2). Results for orders placed or performed during the hospital encounter of 09/21/14 (from the past 72 hour(s))  Valproic acid level     Status: None   Collection Time: 10/03/14  7:50 AM  Result Value Ref Range   Valproic Acid Lvl 78.5 50.0 - 100.0 ug/mL    Comment: Performed at [facility]  Comprehensive metabolic panel     Status: Abnormal   Collection Time: 10/03/14  7:50 AM  Result Value Ref Range   Sodium 140 137 - 147 mEq/L   Potassium 5.2 3.7 - 5.3 mEq/L   Chloride 98 96 - 112 mEq/L   CO2 29 19 - 32 mEq/L   Glucose, Bld 89 70 - 99 mg/dL   BUN 13 6 - 23 mg/dL   Creatinine, Ser 14/07/15 0.50 - 1.35 mg/dL   Calcium 8.9 8.4 - 5.06 mg/dL   Total Protein 6.7 6.0 - 8.3 g/dL   Albumin 3.0 (L) 3.5 - 5.2 g/dL   AST 21  0 - 37 U/L   ALT 22 0 - 53 U/L   Alkaline Phosphatase 83 39 - 117 U/L   Total Bilirubin 0.3 0.3 - 1.2 mg/dL   GFR calc non Af Amer >90 >90 mL/min   GFR calc Af Amer >90 >90 mL/min    Comment: (NOTE) The eGFR has been calculated using the CKD EPI equation. This calculation has not been validated in all clinical situations. eGFR's persistently <90 mL/min signify possible Chronic Kidney Disease.    Anion gap 13 5 - 15  Basic metabolic panel     Status: Abnormal   Collection Time: 10/04/14  3:49 AM  Result Value Ref Range   Sodium 134 (L) 137 - 147 mEq/L   Potassium 4.6 3.7 - 5.3 mEq/L   Chloride 93 (L) 96 - 112 mEq/L   CO2 32 19 - 32 mEq/L   Glucose, Bld 75 70 - 99 mg/dL   BUN 14 6 - 23 mg/dL   Creatinine, Ser 14/08/15 0.50 - 1.35 mg/dL   Calcium 9.1 8.4 - 5.29 mg/dL   GFR  calc non Af Amer 88 (L) >90 mL/min   GFR calc Af Amer >90 >90 mL/min    Comment: (NOTE) The eGFR has been calculated using the CKD EPI equation. This calculation has not been validated in all clinical situations. eGFR's persistently <90 mL/min signify possible Chronic Kidney Disease.    Anion gap 9 5 - 15   Labs are reviewed and are pertinent for the above.  Current Facility-Administered Medications  Medication Dose Route Frequency Provider Last Rate Last Dose  . acetaminophen (TYLENOL) tablet 650 mg  650 mg Oral Q6H PRN Rise Patience, MD   650 mg at 10/03/14 1632   Or  . acetaminophen (TYLENOL) suppository 650 mg  650 mg Rectal Q6H PRN Rise Patience, MD      . albuterol (PROVENTIL) (2.5 MG/3ML) 0.083% nebulizer solution 2.5 mg  2.5 mg Nebulization Q2H PRN Rise Patience, MD   2.5 mg at 10/04/14 0445  . albuterol (PROVENTIL) (2.5 MG/3ML) 0.083% nebulizer solution 2.5 mg  2.5 mg Nebulization QID Oswald Hillock, MD   2.5 mg at 10/04/14 1314  . antiseptic oral rinse (CPC / CETYLPYRIDINIUM CHLORIDE 0.05%) solution 7 mL  7 mL Mouth Rinse BID Samuella Cota, MD   7 mL at 10/02/14 1000  . aspirin  chewable tablet 81 mg  81 mg Oral Daily Mariea Clonts, MD   81 mg at 10/04/14 1049  . atorvastatin (LIPITOR) tablet 40 mg  40 mg Oral q1800 Mojeed Akintayo   40 mg at 10/03/14 1631  . budesonide (PULMICORT) nebulizer solution 0.25 mg  0.25 mg Nebulization BID Rise Patience, MD   0.25 mg at 10/04/14 1740  . divalproex (DEPAKOTE ER) 24 hr tablet 1,500 mg  1,500 mg Oral QHS Durward Parcel, MD   1,500 mg at 10/03/14 2241  . folic acid (FOLVITE) tablet 1 mg  1 mg Oral Daily Rise Patience, MD   1 mg at 10/04/14 1049  . gabapentin (NEURONTIN) capsule 400 mg  400 mg Oral TID Durward Parcel, MD   400 mg at 10/04/14 1049  . hydrOXYzine (ATARAX/VISTARIL) tablet 50 mg  50 mg Oral QHS Durward Parcel, MD   50 mg at 10/03/14 2241  . Influenza vac split quadrivalent PF (FLUARIX) injection 0.5 mL  0.5 mL Intramuscular Tomorrow-1000 Rise Patience, MD   0.5 mL at 09/25/14 1000  . LORazepam (ATIVAN) tablet 0.5 mg  0.5 mg Oral TID PRN Gardiner Barefoot, NP   0.5 mg at 10/04/14 1102   Or  . LORazepam (ATIVAN) injection 0.5 mg  0.5 mg Intramuscular TID PRN Gardiner Barefoot, NP      . multivitamin with minerals tablet 1 tablet  1 tablet Oral Daily Rise Patience, MD   1 tablet at 10/04/14 1049  . nicotine (NICODERM CQ - dosed in mg/24 hours) patch 14 mg  14 mg Transdermal Daily Samuella Cota, MD   14 mg at 10/01/14 1050  . pneumococcal 23 valent vaccine (PNU-IMMUNE) injection 0.5 mL  0.5 mL Intramuscular Tomorrow-1000 Rise Patience, MD   0.5 mL at 09/25/14 1000  . polyethylene glycol (MIRALAX / GLYCOLAX) packet 17 g  17 g Oral BID Samuella Cota, MD   17 g at 10/04/14 1050  . sodium chloride 0.9 % injection 3 mL  3 mL Intravenous Q12H Rise Patience, MD   3 mL at 10/01/14 1000  . sodium chloride 0.9 % injection 3 mL  3 mL  Intravenous Q12H Rise Patience, MD   3 mL at 09/24/14 2136  . tamsulosin (FLOMAX) capsule 0.4 mg  0.4 mg Oral QPC  supper Mojeed Akintayo   0.4 mg at 10/03/14 1631  . thiamine (VITAMIN B-1) tablet 100 mg  100 mg Oral Daily Rise Patience, MD   100 mg at 10/04/14 1048   Or  . thiamine (B-1) injection 100 mg  100 mg Intravenous Daily Rise Patience, MD        Psychiatric Specialty Exam:     Blood pressure 112/62, pulse 78, temperature 98.1 F (36.7 C), temperature source Oral, resp. rate 20, height $RemoveBe'5\' 11"'[id number]$  (1.803 m), weight 76.295 kg (168 lb 3.2 oz), SpO2 97 %.Body mass index is 23.47 kg/(m^2).  General Appearance: Casual  Eye Contact::  Good  Speech:  Clear and Coherent and Normal Rate  Volume:  Normal  Mood:  Euthymic  Affect:  Appropriate, Congruent and Labile  Thought Process:  Coherent and Goal Directed  Orientation:  Full (Time, Place, and Person)  Thought Content:  WDL  Suicidal Thoughts:  No  Homicidal Thoughts:  No  Memory:  Immediate;   Fair Recent;   Fair  Judgement:  Intact  Insight:  Good  Psychomotor Activity:  Normal  Concentration:  Good  Recall:  Good  Fund of Knowledge:Good  Language: Good  Akathisia:  NA  Handed:  Right  AIMS (if indicated):     Assets:  Communication Skills Desire for Improvement Financial Resources/Insurance Housing Leisure Time Physical Health Resilience Social Support  Sleep:   poor   Musculoskeletal: Strength & Muscle Tone: within normal limits Gait & Station: normal Patient leans: N/A  Treatment Plan Summary: Daily contact with patient to assess and evaluate symptoms and progress in treatment Medication management  Continue Depakote ER 1500 mg PO Qhs for bipolar mania (VPA is 78.5 - therapeutic) Checked valproic acid level and CMP which are therapeutic  Continue neurontin 400 mg PO TID for neuropathy and mood  Continue Hydroxyzine 50 mg PO Qhs for insomnia Psych social service will provide disposition plans  Durward Parcel., MD 10/04/2014 2:47 PM

## 2014-10-04 NOTE — Discharge Summary (Addendum)
Physician Discharge Summary  Jennings Booksndrew H Boye MRN: [id number] DOB/AGE: 68/02/1946 68 y.o.  PCP: No PCP Per Patient   Admit date: 09/21/2014 Discharge date: 10/04/2014  Discharge Diagnoses:    Principal Problem:   COPD exacerbation Active Problems:   Bipolar I disorder, single manic episode, severe, with psychosis   H/O aortic valve replacement   History of permanent cardiac pacemaker placement   SOB (shortness of breath)   HCAP (healthcare-associated pneumonia)   Acute diastolic CHF (congestive heart failure)   Manic behavior  Follow up with PCP in 5-7 days  Psychiatry as scheduled     Medication List    STOP taking these medications        predniSONE 20 MG tablet  Commonly known as:  DELTASONE      TAKE these medications        albuterol 108 (90 BASE) MCG/ACT inhaler  Commonly known as:  PROVENTIL HFA;VENTOLIN HFA  Inhale 2 puffs into the lungs every 4 (four) hours as needed.     aspirin 81 MG tablet  Take 1 tablet (81 mg total) by mouth daily.     atorvastatin 40 MG tablet  Commonly known as:  LIPITOR  Take 1 tablet (40 mg total) by mouth daily at 6 PM.     divalproex 500 MG 24 hr tablet  Commonly known as:  DEPAKOTE ER  Take 3 tablets (1,500 mg total) by mouth at bedtime.     folic acid 1 MG tablet  Commonly known as:  FOLVITE  Take 1 tablet (1 mg total) by mouth daily.     furosemide 40 MG tablet  Commonly known as:  LASIX  Take 1 tablet (40 mg total) by mouth daily.     gabapentin 400 MG capsule  Commonly known as:  NEURONTIN  Take 1 capsule (400 mg total) by mouth 3 (three) times daily.     hydrOXYzine 50 MG tablet  Commonly known as:  ATARAX/VISTARIL  Take 1 tablet (50 mg total) by mouth at bedtime.     ipratropium-albuterol 0.5-2.5 (3) MG/3ML Soln  Commonly known as:  DUONEB  Take 3 mLs by nebulization every 4 (four) hours as needed. Shortness of breath/wheezing     mometasone-formoterol 200-5 MCG/ACT Aero  Commonly known as:  DULERA   Inhale 2 puffs into the lungs 2 (two) times daily.     nicotine 14 mg/24hr patch  Commonly known as:  NICODERM CQ - dosed in mg/24 hours  Place 1 patch (14 mg total) onto the skin daily.     tamsulosin 0.4 MG Caps capsule  Commonly known as:  FLOMAX  Take 1 capsule (0.4 mg total) by mouth daily after supper.     thiamine 100 MG tablet  Take 1 tablet (100 mg total) by mouth daily.     traZODone 50 MG tablet  Commonly known as:  DESYREL  Take 1 tablet (50 mg total) by mouth at bedtime as needed for sleep.         Summary: 68 year old man with recurrent severe manic and psychotic features presented to the ER on 11/25 for the seventh time in the last month. He presented with IVC papers. He was noted to be delusional at hotel, displayed belligerent behavior in ED, requiring IM injection, police restraint and shackling to the bed. [facility]. 11/27 Psychiatrist recommended inpatient psych treatment. He was extremely uncooperative and transferred to the emergency department for shortness of breath. There he was found to have oxygen  saturation 89% on room air with diffuse wheezes. He was admitted for COPD exacerbation, possible mild CHF, possible pneumonia. His condition rapidly improved with Lasix, antibiotics and breathing treatments. He is medically clear for discharge from medical Hospital to inpatient psychiatry when bed available and as per psychiatry recommendations.  Assessment/Plan: 1. Bipolar disorder with recurrent severe manic, psychotic features.Management per psychiatry. 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 No focal deficits noted. CT head negative 06/2014 at [facility] (no MRI/pacemaker). Patient continues to demonstrate manic symptoms but no psychosis or agitation and has been cooperative. As per nursing, patient had been refusing intermittent doses of Depakote. Psychiatry follow-up  appreciated-recommend continuing Neurontin for neuropathy and mood, hydroxyzine for insomnia and Depakote ER for mood swings.   Psychiatry recommended IVC initially. CSW spoke with psych MD who reports patient has therapeutic levels and has been compliant with medications. Psych MD reports patient is stable to DC back home. Psych MD signed Notice of Commitment Change form to rescind IVC. Valproate level therapeutic. 2. Mild COPD exacerbation, resolved. Avoid steroids at this point given clinical improvement. Completed antibiotic course-DC 3. Mild acute diastolic heart failure. Resolved. Echo results show reduced AF. Discussed with Dr. Willa RoughJeffrey Katz, Cardiology who reviewed the case 12/4 and recommends no further workup especially given no symptoms. Patient to [facility]. Discontinued Lasix and follow clinically. 4. Possible pneumonia. Clinically resolved. Afebrile. Completed antibiotics-DC. Follow-up chest x-ray in 4-6 weeks to ensure resolution of pneumonia findings in left base. 5. Abnormal TSH. Significance unclear. TSH 0.357 November 12, 0.053 November 28. Free T4, T3 total within normal limits November 12. Repeat T4 normal, T3 slightly low. Overall doubt clinically significant. He has no other features to suggest hyperthyroidism. He seems to be responding to Depakote. Continue treatment per psychiatry. Recommend follow-up of full TFTs in 4-6 weeks. 6. Status post aortic [facility] not on anticoagulation. Continue aspirin, statin. 7. Status post pacemaker placement; battery replaced September 2015 and the [facility] with [facility] heart block because the battery had run out and the patient failed to follow-up.  8. Prolonged QTc. Avoid antipsychotics. Last QTC was 475 ms on 11/28    Code Status: full code DVT prophylaxis: Lovenox Family Communication: None  at bedside Disposition Plan: Awaiting bed availability  Summary: ER 11/5-11/9: IVC placed for psychosis at [facility]. Psychiatry recommended inpatient treatment. Patient with combativeness required Ativan, Haldol, Geodon, police presence and shackling to bed. [facility]  [facility]  11/9-11/16 hospitalized for bipolar disorder, recurrent, severe, manic with psychotic features. Antipsychotics were avoided because of QT C elevation. Treated with Depakote ER 1000 mg QHS, Trazodone 50 mg QHS PRN sleep and Ativan 0.5 mg as needed to address agitation and mania.  ER 11/18 presented with chest pain. Patient left. 11/22 complaint of shortness of breath, cough, ran out of inhaler. Discharged with prescription for inhaler. 11/22 second visit same day for albuterol neb Rx. Left AMA, verbally abusive. 11/24 SOB. Left AMA. Refused interventions. 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 Treated for COPD exacerbation Consultants:  Psychiatry   Procedures:  None  Antibiotics:  Cefepime 11/28 >>11/29  Cefuroxime > DC'd  HPI/Subjective: Patient stable, no complaints  Objective: Filed Vitals:   10/03/14 2037 10/04/14 0401 10/04/14 0828 10/04/14 1304  BP:  107/60  112/62  Pulse:  80  78  Temp:  98.1 F (36.7 C)  98.1 F (36.7 C)  TempSrc:  Oral  Oral  Resp:  18  20  Height:      Weight:  76.295 kg (168 lb 3.2 oz)    SpO2: 95% 96% 95% 97%    Intake/Output Summary (Last 24 hours) at 10/04/14 1634 Last data filed at 10/04/14 1305  Gross per 24 hour  Intake   1320 ml  Output      0 ml  Net   1320 ml     Filed Weights   10/02/14 0457 10/03/14 0524 10/04/14 0401  Weight: 72.938 kg (160 lb 12.8 oz) 74.163 kg (163 lb 8 oz) 76.295 kg (168 lb 3.2 oz)    Exam:    Physical Exam: Gen. exam: Pleasant middle-aged male ambulating comfortably in the room. Lungs: Normal respiratory effort, bilateral clear to auscultation, no crackles or wheezes.  Heart: Regular rate and  rhythm, S1 and S2 normal, no murmurs, rubs auscultated. Non-telemetry. Abdomen: BS normoactive,soft,nondistended,non-tender to palpation,no organomegaly Extremities: No pretibial edema, no erythema, no cyanosis, no clubbing Neuro : Alert and oriented to time, place and person, No focal deficits   Data Reviewed: Free T4 within normal limits. T3 slightly low. The studies were just performed 17 days ago and were unremarkable.  Scheduled Meds: . albuterol  2.5 mg Nebulization QID  . antiseptic oral rinse  7 mL Mouth Rinse BID  . aspirin  81 mg Oral Daily  . atorvastatin  40 mg Oral q1800  . budesonide (PULMICORT) nebulizer solution  0.25 mg Nebulization BID  . divalproex  1,500 mg Oral QHS  . folic acid  1 mg Oral Daily  . gabapentin  400 mg Oral TID  . hydrOXYzine  50 mg Oral QHS  . Influenza vac split quadrivalent PF  0.5 mL Intramuscular Tomorrow-1000  . multivitamin with minerals  1 tablet Oral Daily  . nicotine  14 mg Transdermal Daily  . pneumococcal 23 valent vaccine  0.5 mL Intramuscular Tomorrow-1000  . polyethylene glycol  17 g Oral BID  . sodium chloride  3 mL Intravenous Q12H  . sodium chloride  3 mL Intravenous Q12H  . tamsulosin  0.4 mg Oral QPC supper  . thiamine  100 mg Oral Daily   Or  . thiamine  100 mg Intravenous Daily   Continuous Infusions:   Principal Problem:   COPD exacerbation Active Problems:   Bipolar I disorder, single manic episode, severe, with psychosis   H/O aortic valve replacement   History of permanent cardiac pacemaker placement   SOB (shortness of breath)   HCAP (healthcare-associated pneumonia)   Acute diastolic CHF (congestive heart failure)   Manic behavior   Time spent    20 minutes     Richarda Overlie, MD,   [facility] Pager [phone]  If 7PM-7AM, please contact night-coverage [email or web address] Password [facility] 10/04/2014, 4:34 PM

## 2014-10-04 NOTE — Progress Notes (Addendum)
PROGRESS NOTE  [name] [name] H Sevin LKG:[id number] DOB: 06/08/1946 DOA: 09/21/2014 PCP: No PCP Per Patient  Summary: 68 year old man with recurrent severe manic and psychotic features presented to the ER on 11/25 for the seventh time in the last month. He presented with IVC papers. He was noted to be delusional at hotel, displayed belligerent behavior in ED, requiring IM injection, police restraint and shackling to the bed. [facility]. 11/27 Psychiatrist recommended inpatient psych treatment. He was extremely uncooperative and transferred to the emergency department for shortness of breath. There he was found to have oxygen saturation 89% on room air with diffuse wheezes. He was admitted for COPD exacerbation, possible mild CHF, possible pneumonia. His condition rapidly improved with Lasix, antibiotics and breathing treatments. He is medically clear for discharge from medical Hospital to inpatient psychiatry when bed available and as per psychiatry recommendations.  Assessment/Plan: 1. Bipolar disorder with recurrent severe manic, psychotic features.Management per psychiatry. 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 No focal deficits noted. CT head negative 06/2014 at [facility] (no MRI/pacemaker). Patient continues to demonstrate manic symptoms but no psychosis or agitation and has been cooperative. As per nursing, patient had been refusing intermittent doses of Depakote. Psychiatry follow-up appreciated-recommend continuing Neurontin for neuropathy and mood, hydroxyzine for insomnia and Depakote ER for mood swings.   Psychiatry recommended renewing IVC paperwork. As per psychiatry social worker, no bed offers for inpatient psychiatry admission. Discussed with Psychiatrist: patient not   stable for DC home.  Valproate level therapeutic. 2. Mild COPD exacerbation, resolved. Avoid steroids at this point given clinical  improvement. Completed antibiotic course-DC 3. Mild acute diastolic heart failure. Resolved. Echo results show reduced AF. Discussed with Dr. Willa RoughJeffrey Katz, Cardiology who reviewed the case 12/4 and recommends no further workup especially given no symptoms. Patient to follow-up [facility]. Discontinued Lasix and follow clinically. 4. Possible pneumonia. Clinically resolved. Afebrile. Completed antibiotics-DC. Follow-up chest x-ray in 4-6 weeks to ensure resolution of pneumonia findings in left base. 5. Abnormal TSH. Significance unclear. TSH 0.357 November 12, 0.053 November 28. Free T4, T3 total within normal limits November 12. Repeat T4 normal, T3 slightly low. Overall doubt clinically significant. He has no other features to suggest hyperthyroidism. He seems to be responding to Depakote. Continue treatment per psychiatry. Recommend follow-up of full TFTs in 4-6 weeks. 6. Status post aortic valve [facility] not on anticoagulation. Continue aspirin, statin. 7. Status post pacemaker placement; battery replaced September 2015 and [facility] heart block because the battery had run out and the patient failed to follow-up.  8. Prolonged QTc. Avoid antipsychotics. Last QTC was 475 ms on 11/28    Code Status: full code DVT prophylaxis: Lovenox Family Communication: None at bedside Disposition Plan: Awaiting bed availability  Summary: ER 11/5-11/9: [facility] for psychosis at [facility]. Psychiatry recommended inpatient treatment. Patient with combativeness required Ativan, Haldol, Geodon, police presence and shackling to bed. [facility]  [facility]11/16 hospitalized for bipolar disorder, recurrent, severe, manic with psychotic features. Antipsychotics were avoided because of QT C elevation. Treated with Depakote ER 1000 mg QHS, Trazodone 50 mg QHS PRN  sleep and Ativan 0.5 mg as needed to address agitation and mania.  ER 11/18 presented with chest pain. Patient left. 11/22 complaint of shortness of breath, cough, ran out of inhaler. Discharged  with prescription for inhaler. 11/22 second visit same day for albuterol neb Rx. Left AMA, verbally abusive. 11/24 SOB. Left AMA. Refused interventions. 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 Treated for COPD exacerbation Consultants:  Psychiatry   Procedures:  None  Antibiotics:  Cefepime 11/28 >>11/29  Cefuroxime > DC'd  HPI/Subjective: Patient stable, no complaints  Objective: Filed Vitals:   10/03/14 2023 10/03/14 2037 10/04/14 0401 10/04/14 0828  BP: 121/70  107/60   Pulse: 81  80   Temp: 98.1 F (36.7 C)  98.1 F (36.7 C)   TempSrc: Oral  Oral   Resp: 18  18   Height:      Weight:   76.295 kg (168 lb 3.2 oz)   SpO2: 97% 95% 96% 95%    Intake/Output Summary (Last 24 hours) at 10/04/14 1117 Last data filed at 10/04/14 0057  Gross per 24 hour  Intake   1080 ml  Output      0 ml  Net   1080 ml     Filed Weights   10/02/14 0457 10/03/14 0524 10/04/14 0401  Weight: 72.938 kg (160 lb 12.8 oz) 74.163 kg (163 lb 8 oz) 76.295 kg (168 lb 3.2 oz)    Exam:    Physical Exam: Gen. exam: Pleasant middle-aged male ambulating comfortably in the room. Lungs: Normal respiratory effort, bilateral clear to auscultation, no crackles or wheezes.  Heart: Regular rate and rhythm, S1 and S2 normal, no murmurs, rubs auscultated. Non-telemetry. Abdomen: BS normoactive,soft,nondistended,non-tender to palpation,no organomegaly Extremities: No pretibial edema, no erythema, no cyanosis, no clubbing Neuro : Alert and oriented to time, place and person, No focal deficits   Data Reviewed: Free T4 within normal limits. T3 slightly low. The studies were just performed 17 days ago and were unremarkable.  Scheduled Meds: . albuterol  2.5 mg Nebulization QID  .  antiseptic oral rinse  7 mL Mouth Rinse BID  . aspirin  81 mg Oral Daily  . atorvastatin  40 mg Oral q1800  . budesonide (PULMICORT) nebulizer solution  0.25 mg Nebulization BID  . divalproex  1,500 mg Oral QHS  . folic acid  1 mg Oral Daily  . gabapentin  400 mg Oral TID  . hydrOXYzine  50 mg Oral QHS  . Influenza vac split quadrivalent PF  0.5 mL Intramuscular Tomorrow-1000  . multivitamin with minerals  1 tablet Oral Daily  . nicotine  14 mg Transdermal Daily  . pneumococcal 23 valent vaccine  0.5 mL Intramuscular Tomorrow-1000  . polyethylene glycol  17 g Oral BID  . sodium chloride  3 mL Intravenous Q12H  . sodium chloride  3 mL Intravenous Q12H  . tamsulosin  0.4 mg Oral QPC supper  . thiamine  100 mg Oral Daily   Or  . thiamine  100 mg Intravenous Daily   Continuous Infusions:   Principal Problem:   COPD exacerbation Active Problems:   Bipolar I disorder, single manic episode, severe, with psychosis   H/O aortic valve replacement   History of permanent cardiac pacemaker placement   SOB (shortness of breath)   HCAP (healthcare-associated pneumonia)   Acute diastolic CHF (congestive heart failure)   Manic behavior   Time spent    20 minutes     Richarda OverlieABROL,Aisa Schoeppner, MD,   [facility] Pager [phone]  If 7PM-7AM, please contact night-coverage [email or web address] Password [email or web address] 10/04/2014, 11:17 AM

## 2014-11-16 ENCOUNTER — Ambulatory Visit (HOSPITAL_COMMUNITY): Payer: Self-pay | Admitting: Psychiatry

## 2014-11-29 ENCOUNTER — Encounter (HOSPITAL_COMMUNITY): Payer: Self-pay | Admitting: Emergency Medicine

## 2014-11-29 ENCOUNTER — Emergency Department (HOSPITAL_COMMUNITY)
Admission: EM | Admit: 2014-11-29 | Discharge: 2014-11-29 | Disposition: A | Discharge status: Home / Self Care | Payer: Medicare HMO | Attending: Emergency Medicine | Admitting: Emergency Medicine

## 2014-11-29 DIAGNOSIS — Z87891 Personal history of nicotine dependence: Secondary | ICD-10-CM | POA: Diagnosis not present

## 2014-11-29 DIAGNOSIS — Z87448 Personal history of other diseases of urinary system: Secondary | ICD-10-CM | POA: Insufficient documentation

## 2014-11-29 DIAGNOSIS — R0602 Shortness of breath: Secondary | ICD-10-CM | POA: Diagnosis present

## 2014-11-29 DIAGNOSIS — J441 Chronic obstructive pulmonary disease with (acute) exacerbation: Secondary | ICD-10-CM | POA: Insufficient documentation

## 2014-11-29 DIAGNOSIS — Z950 Presence of cardiac pacemaker: Secondary | ICD-10-CM | POA: Diagnosis not present

## 2014-11-29 DIAGNOSIS — Z7952 Long term (current) use of systemic steroids: Secondary | ICD-10-CM | POA: Diagnosis not present

## 2014-11-29 DIAGNOSIS — Z7982 Long term (current) use of aspirin: Secondary | ICD-10-CM | POA: Diagnosis not present

## 2014-11-29 DIAGNOSIS — F418 Other specified anxiety disorders: Secondary | ICD-10-CM | POA: Insufficient documentation

## 2014-11-29 DIAGNOSIS — I2510 Atherosclerotic heart disease of native coronary artery without angina pectoris: Secondary | ICD-10-CM | POA: Insufficient documentation

## 2014-11-29 DIAGNOSIS — Z792 Long term (current) use of antibiotics: Secondary | ICD-10-CM | POA: Insufficient documentation

## 2014-11-29 MED ORDER — IPRATROPIUM-ALBUTEROL 0.5-2.5 (3) MG/3ML IN SOLN
3.0000 mL | Freq: Once | RESPIRATORY_TRACT | Status: AC
  Administered 2014-11-29: 3 mL via RESPIRATORY_TRACT
  Filled 2014-11-29: qty 3

## 2014-11-29 MED ORDER — ALBUTEROL SULFATE HFA 108 (90 BASE) MCG/ACT IN AERS: 2.0000 | INHALATION_SPRAY | RESPIRATORY_TRACT | Status: DC | PRN

## 2014-11-29 MED ORDER — ALBUTEROL SULFATE (2.5 MG/3ML) 0.083% IN NEBU
5.0000 mg | INHALATION_SOLUTION | Freq: Once | RESPIRATORY_TRACT | Status: AC
Start: 2014-11-29 — End: 2014-11-29
  Administered 2014-11-29: 5 mg via RESPIRATORY_TRACT
  Filled 2014-11-29: qty 6

## 2014-11-29 MED ORDER — ALBUTEROL SULFATE (2.5 MG/3ML) 0.083% IN NEBU: 5.0000 mg | INHALATION_SOLUTION | Freq: Once | RESPIRATORY_TRACT | Status: DC

## 2014-11-29 MED ORDER — LORAZEPAM 1 MG PO TABS
1.0000 mg | ORAL_TABLET | Freq: Once | ORAL | Status: AC
  Administered 2014-11-29: 1 mg via ORAL
  Filled 2014-11-29: qty 1

## 2014-11-29 MED ORDER — ALBUTEROL SULFATE (2.5 MG/3ML) 0.083% IN NEBU
2.5000 mg | INHALATION_SOLUTION | Freq: Once | RESPIRATORY_TRACT | Status: AC
  Administered 2014-11-29: 2.5 mg via RESPIRATORY_TRACT
  Filled 2014-11-29: qty 3

## 2014-11-29 MED ORDER — PREDNISONE 20 MG PO TABS: 60.0000 mg | ORAL_TABLET | Freq: Every day | ORAL | Status: DC

## 2014-11-29 MED ORDER — IPRATROPIUM BROMIDE 0.02 % IN SOLN: 0.5000 mg | Freq: Once | RESPIRATORY_TRACT | Status: DC

## 2014-11-29 MED ORDER — PREDNISONE 50 MG PO TABS
60.0000 mg | ORAL_TABLET | Freq: Once | ORAL | Status: AC
  Administered 2014-11-29: 60 mg via ORAL
  Filled 2014-11-29 (×2): qty 1

## 2014-11-29 NOTE — ED Notes (Signed)
EDP at bedside  

## 2014-11-29 NOTE — ED Notes (Signed)
RT paged for breathing treatment

## 2014-11-29 NOTE — Discharge Instructions (Signed)
It was our pleasure to provide your ER care today - we hope that you feel better.  Take prednisone as prescribed.  Use albuterol inhaler as need.  It is very important that you stop smoking immediately - if you continue to smoke, your breathing will progressively become worse.   Follow up with primary care doctor in the next few days.  Return to ER if worse, new symptoms, fevers, chest pain, breathing worsens, other concern.  You were given medication in the ER that causes drowsiness - no driving for the next 4 hours.        Chronic Obstructive Pulmonary Disease Chronic obstructive pulmonary disease (COPD) is a common lung condition in which airflow from the lungs is limited. COPD is a general term that can be used to describe many different lung problems that limit airflow, including both chronic bronchitis and emphysema. If you have COPD, your lung function will probably never return to normal, but there are measures you can take to improve lung function and make yourself feel better.  CAUSES   Smoking (common).   Exposure to secondhand smoke.   Genetic problems.  Chronic inflammatory lung diseases or recurrent infections. SYMPTOMS   Shortness of breath, especially with physical activity.   Deep, persistent (chronic) cough with a large amount of thick mucus.   Wheezing.   Rapid breaths (tachypnea).   Gray or bluish discoloration (cyanosis) of the skin, especially in fingers, toes, or lips.   Fatigue.   Weight loss.   Frequent infections or episodes when breathing symptoms become much worse (exacerbations).   Chest tightness. DIAGNOSIS  Your health care provider will take a medical history and perform a physical examination to make the initial diagnosis. Additional tests for COPD may include:   Lung (pulmonary) function tests.  Chest X-ray.  CT scan.  Blood tests. TREATMENT  Treatment available to help you feel better when you have COPD  includes:   Inhaler and nebulizer medicines. These help manage the symptoms of COPD and make your breathing more comfortable.  Supplemental oxygen. Supplemental oxygen is only helpful if you have a low oxygen level in your blood.   Exercise and physical activity. These are beneficial for nearly all people with COPD. Some people may also benefit from a pulmonary rehabilitation program. HOME CARE INSTRUCTIONS   Take all medicines (inhaled or pills) as directed by your health care provider.  Avoid over-the-counter medicines or cough syrups that dry up your airway (such as antihistamines) and slow down the elimination of secretions unless instructed otherwise by your health care provider.   If you are a smoker, the most important thing that you can do is stop smoking. Continuing to smoke will cause further lung damage and breathing trouble. Ask your health care provider for help with quitting smoking. He or she can direct you to community resources or hospitals that provide support.  Avoid exposure to irritants such as smoke, chemicals, and fumes that aggravate your breathing.  Use oxygen therapy and pulmonary rehabilitation if directed by your health care provider. If you require home oxygen therapy, ask your health care provider whether you should purchase a pulse oximeter to measure your oxygen level at home.   Avoid contact with individuals who have a contagious illness.  Avoid extreme temperature and humidity changes.  Eat healthy foods. Eating smaller, more frequent meals and resting before meals may help you maintain your strength.  Stay active, but balance activity with periods of rest. Exercise and physical activity will  help you maintain your ability to do things you want to do.  Preventing infection and hospitalization is very important when you have COPD. Make sure to receive all the vaccines your health care provider recommends, especially the pneumococcal and influenza  vaccines. Ask your health care provider whether you need a pneumonia vaccine.  Learn and use relaxation techniques to manage stress.  Learn and use controlled breathing techniques as directed by your health care provider. Controlled breathing techniques include:   Pursed lip breathing. Start by breathing in (inhaling) through your nose for 1 second. Then, purse your lips as if you were going to whistle and breathe out (exhale) through the pursed lips for 2 seconds.   Diaphragmatic breathing. Start by putting one hand on your abdomen just above your waist. Inhale slowly through your nose. The hand on your abdomen should move out. Then purse your lips and exhale slowly. You should be able to feel the hand on your abdomen moving in as you exhale.   Learn and use controlled coughing to clear mucus from your lungs. Controlled coughing is a series of short, progressive coughs. The steps of controlled coughing are:   Lean your head slightly forward.   Breathe in deeply using diaphragmatic breathing.   Try to hold your breath for 3 seconds.   Keep your mouth slightly open while coughing twice.   Spit any mucus out into a tissue.   Rest and repeat the steps once or twice as needed. SEEK MEDICAL CARE IF:   You are coughing up more mucus than usual.   There is a change in the color or thickness of your mucus.   Your breathing is more labored than usual.   Your breathing is faster than usual.  SEEK IMMEDIATE MEDICAL CARE IF:   You have shortness of breath while you are resting.   You have shortness of breath that prevents you from:  Being able to talk.   Performing your usual physical activities.   You have chest pain lasting longer than 5 minutes.   Your skin color is more cyanotic than usual.  You measure low oxygen saturations for longer than 5 minutes with a pulse oximeter. MAKE SURE YOU:   Understand these instructions.  Will watch your condition.  Will  get help right away if you are not doing well or get worse. Document Released: 07/24/2005 Document Revised: 02/28/2014 Document Reviewed: 06/10/2013 [facility] Patient Information 2015 [place], [place]. This information is not intended to replace advice given to you by your health care provider. Make sure you discuss any questions you have with your health care provider.     Smoking Hazards Smoking cigarettes is extremely bad for your health. Tobacco smoke has over 200 known poisons in it. It contains the poisonous gases nitrogen oxide and carbon monoxide. There are over 60 chemicals in tobacco smoke that cause cancer. Some of the chemicals found in cigarette smoke include:   Cyanide.   Benzene.   Formaldehyde.   Methanol (wood alcohol).   Acetylene (fuel used in welding torches).   Ammonia.  Even smoking lightly shortens your life expectancy by several years. You can greatly reduce the risk of medical problems for you and your family by stopping now. Smoking is the most preventable cause of death and disease in our society. Within days of quitting smoking, your circulation improves, you decrease the risk of having a heart attack, and your lung capacity improves. There may be some increased phlegm in the first few  days after quitting, and it may take months for your lungs to clear up completely. Quitting for 10 years reduces your risk of developing lung cancer to almost that of a nonsmoker.  WHAT ARE THE RISKS OF SMOKING? Cigarette smokers have an increased risk of many serious medical problems, including:  Lung cancer.   Lung disease (such as pneumonia, bronchitis, and emphysema).   Heart attack and chest pain due to the heart not getting enough oxygen (angina).   Heart disease and peripheral blood vessel disease.   Hypertension.   Stroke.   Oral cancer (cancer of the lip, mouth, or voice box).   Bladder cancer.   Pancreatic cancer.   Cervical cancer.    Pregnancy complications, including premature birth.   Stillbirths and smaller newborn babies, birth defects, and genetic damage to sperm.   Early menopause.   Lower estrogen level for women.   Infertility.   Facial wrinkles.   Blindness.   Increased risk of broken bones (fractures).   Senile dementia.   Stomach ulcers and internal bleeding.   Delayed wound healing and increased risk of complications during surgery. Because of secondhand smoke exposure, children of smokers have an increased risk of the following:   Sudden infant death syndrome (SIDS).   Respiratory infections.   Lung cancer.   Heart disease.   Ear infections.  WHY IS SMOKING ADDICTIVE? Nicotine is the chemical agent in tobacco that is capable of causing addiction or dependence. When you smoke and inhale, nicotine is absorbed rapidly into the bloodstream through your lungs. Both inhaled and noninhaled nicotine may be addictive.  WHAT ARE THE BENEFITS OF QUITTING?  There are many health benefits to quitting smoking. Some are:   The likelihood of developing cancer and heart disease decreases. Health improvements are seen almost immediately.   Blood pressure, pulse rate, and breathing patterns start returning to normal soon after quitting.   People who quit may see an improvement in their overall quality of life.  HOW DO YOU QUIT SMOKING? Smoking is an addiction with both physical and psychological effects, and longtime habits can be hard to change. Your health care provider can recommend:  Programs and community resources, which may include group support, education, or therapy.  Replacement products, such as patches, gum, and nasal sprays. Use these products only as directed. Do not replace cigarette smoking with electronic cigarettes (commonly called e-cigarettes). The safety of e-cigarettes is unknown, and some may contain harmful chemicals. FOR MORE INFORMATION  American Lung  Association: www.lung.org  American Cancer Society: www.cancer.org Document Released: 11/21/2004 Document Revised: 08/04/2013 Document Reviewed: 04/05/2013 [facility] Patient Information 2015 [place]. This information is not intended to replace advice given to you by your health care provider. Make sure you discuss any questions you have with your health care provider.

## 2014-11-29 NOTE — ED Notes (Signed)
Pt. Agitated Requesting that I give him a ride home, informed pt. That I was not able to provide him a ride home. Pt. States "thats ok, I will go out to the waiting room and start a fight with some people, then the police will come and take me home". Pt. Escorted to waiting room by security.

## 2014-11-29 NOTE — ED Notes (Signed)
Pt. Reports he is out of nebulizer at home. Pt. Reports starting to feel short of breath today. No distress noted.

## 2014-11-29 NOTE — ED Notes (Signed)
Per EMS pt. Feeling short of breath. Pt. Given 2 albuterol, 1 atrovent, and 125 of solumedrol en route. Pt. With 18G in the left forearm.

## 2014-11-29 NOTE — ED Provider Notes (Signed)
CSN: [id number]     Arrival date & time 11/29/14  1902 History   First MD Initiated Contact with Patient 11/29/14 1917     Chief Complaint  Patient presents with  . Shortness of Breath     (Consider location/radiation/quality/duration/timing/severity/associated sxs/prior Treatment) Patient is a 69 y.o. male presenting with shortness of breath. The history is provided by the patient.  Shortness of Breath Associated symptoms: wheezing   Associated symptoms: no abdominal pain, no chest pain, no cough, no fever, no headaches, no neck pain, no rash, no sore throat and no vomiting   pt with hx copd, c/o increased wheezing after running out of inhaler for the past couple days. Wheezing persistent, moderate. Denies cough, sore throat, or uri c/o. No fever or chills. No current or recent cp or discomfort. No leg pain or swelling. No orthopnea. States feels same as similar copd exacerbations. Pt is a smoker and continues to smoke.       Past Medical History  Diagnosis Date  . 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 COPD (chronic obstructive pulmonary disease)   . Thoracic ascending aortic aneurysm     repaired 06/2014 at [facility]  . COPD (chronic obstructive pulmonary disease)   . BPH (benign prostatic hyperplasia)   . Pacemaker     battery replaced 06/2014 at [facility]  . SSS (sick sinus syndrome)     s/p PPM   . CAD (coronary artery disease)     ?MI in 2007  . Pneumothorax     traumatic 2007  . Anxiety and depression   . Psychosis   . Bipolar disorder    Past Surgical History  Procedure Laterality Date  . Cardiac surgery      ascending thoracic aneurysm repair 06/2014  . Pacemaker placement  2007    battery replaced 07/04/2014  . Hiatal hernia repair    . Knee surgery      L5-6  . Lumbar fusion    . Aortic valve replacement      porcine valve   Family History  Problem Relation Age of Onset  . CAD Father   . Diabetes Father   . High blood  pressure Brother   . Cancer Brother    History  Substance Use Topics  . Smoking status: Former Smoker -- 1.00 packs/day    Types: Cigarettes  . Smokeless tobacco: Not on file  . Alcohol Use: Yes    Review of Systems  Constitutional: Negative for fever and chills.  HENT: Negative for sore throat.   Eyes: Negative for redness.  Respiratory: Positive for shortness of breath and wheezing. Negative for cough.   Cardiovascular: Negative for chest pain and leg swelling.  Gastrointestinal: Negative for vomiting and abdominal pain.  Genitourinary: Negative for dysuria and flank pain.  Musculoskeletal: Negative for back pain and neck pain.  Skin: Negative for rash.  Neurological: Negative for headaches.  Hematological: Does not bruise/bleed easily.  Psychiatric/Behavioral: Negative for confusion.      Allergies  Tiotropium bromide monohydrate and Morphine and related  Home Medications   Prior to Admission medications   Medication Sig Start Date End Date Taking? Authorizing Provider  albuterol (PROVENTIL HFA;VENTOLIN HFA) 108 (90 BASE) MCG/ACT inhaler Inhale 2 puffs into the lungs every 4 (four) hours as needed. Patient taking differently: Inhale 2 puffs into the lungs every 4 (four) hours as needed for wheezing or shortness of breath.  09/18/14   Iva  Hildred LaserL Knapp, MD  aspirin 81 MG tablet Take 1 tablet (81 mg total) by mouth daily. 09/12/14   Velna HatchetSheila May Agustin, NP  atorvastatin (LIPITOR) 40 MG tablet Take 1 tablet (40 mg total) by mouth daily at 6 PM. 10/04/14   Richarda OverlieNayana Abrol, MD  divalproex (DEPAKOTE ER) 500 MG 24 hr tablet Take 3 tablets (1,500 mg total) by mouth at bedtime. 10/04/14   Richarda OverlieNayana Abrol, MD  folic acid (FOLVITE) 1 MG tablet Take 1 tablet (1 mg total) by mouth daily. 10/04/14   Richarda OverlieNayana Abrol, MD  furosemide (LASIX) 40 MG tablet Take 1 tablet (40 mg total) by mouth daily. 09/12/14   Velna HatchetSheila May Agustin, NP  gabapentin (NEURONTIN) 400 MG capsule Take 1 capsule (400 mg total) by mouth  3 (three) times daily. 10/04/14   Richarda OverlieNayana Abrol, MD  hydrOXYzine (ATARAX/VISTARIL) 50 MG tablet Take 1 tablet (50 mg total) by mouth at bedtime. 10/04/14   Richarda OverlieNayana Abrol, MD  ipratropium-albuterol (DUONEB) 0.5-2.5 (3) MG/3ML SOLN Take 3 mLs by nebulization every 4 (four) hours as needed. Shortness of breath/wheezing 09/21/14   Olivia Mackielga M Otter, MD  mometasone-formoterol [facility](DULERA) 200-5 MCG/ACT AERO Inhale 2 puffs into the lungs 2 (two) times daily. 09/12/14   Velna HatchetSheila May Agustin, NP  nicotine (NICODERM CQ - DOSED IN MG/24 HOURS) 14 mg/24hr patch Place 1 patch (14 mg total) onto the skin daily. 10/04/14   Richarda OverlieNayana Abrol, MD  tamsulosin (FLOMAX) 0.4 MG CAPS capsule Take 1 capsule (0.4 mg total) by mouth daily after supper. 10/04/14   Richarda OverlieNayana Abrol, MD  thiamine 100 MG tablet Take 1 tablet (100 mg total) by mouth daily. 10/04/14   Richarda OverlieNayana Abrol, MD  traZODone (DESYREL) 50 MG tablet Take 1 tablet (50 mg total) by mouth at bedtime as needed for sleep. Patient not taking: Reported on 09/18/2014 09/12/14   Velna HatchetSheila May Agustin, NP   BP 128/77 mmHg  Pulse 88  Temp(Src) 98.1 F (36.7 C) (Oral)  Resp 18  Ht 5\' 10"  (1.778 m)  Wt 175 lb (79.379 kg)  BMI 25.11 kg/m2  SpO2 95% Physical Exam  Constitutional: He is oriented to person, place, and time. He appears well-developed and well-nourished. No distress.  HENT:  Head: Atraumatic.  Nose: Nose normal.  Mouth/Throat: Oropharynx is clear and moist.  Eyes: Conjunctivae are normal. No scleral icterus.  Neck: Neck supple. No JVD present. No tracheal deviation present.  Cardiovascular: Normal rate, regular rhythm, normal heart sounds and intact distal pulses.   Pulmonary/Chest: No accessory muscle usage. He is in respiratory distress. He has wheezes.  Diminished bs with bil wheezing.   Abdominal: Soft. He exhibits no distension. There is no tenderness.  Musculoskeletal: Normal range of motion. He exhibits no edema or tenderness.  Neurological: He is alert and oriented to  person, place, and time.  Skin: Skin is warm and dry. He is not diaphoretic.  Psychiatric:  Mildly anxious (and pt states when stressed his breathing worsens).   Nursing note and vitals reviewed.   ED Course  Procedures (including critical care time)     MDM   Albuterol and atrovent neb.   Persistent wheezing. pred po.  Additional neb tx.  Pt states out of home mdi, requests refill - will provide.  Reviewed nursing notes and prior charts for additional history.   Recheck, improved air exchange. No increased wob.  Pt breathing comfortably. No cp. No fever.   Pt states feels ready for d/c.     Suzi RootsKevin E Mykaylah Ballman, MD 11/29/14  2051 

## 2014-11-30 ENCOUNTER — Telehealth: Payer: Self-pay

## 2014-12-01 NOTE — Telephone Encounter (Signed)
We received a note from the hospital in our in basket  Patient Name: Barron SchmidBOSWELL,Rimas H([id number])    Sex: Male    DOB: 1946-09-01        PCP: NO PCP PER PATIENT    [facility]        Types of orders made on 11/29/2014: Consult, Medications        Order Date:11/29/2014    Ordering User:STEINL, Caryn BeeKEVIN [[id number]]    Attending Provider:Kevin Cheri RousE Steinl, MD [phone][[id number]    Authorizing Provider: Suzi RootsKevin E Steinl, MD [[id number]]    Department:[facility]EMERGENCY [facility][[id number]]EPT[[id number]]        Order Specific Information    Order: COPD clinic follow up [Custom: CON4000] Order #: [id number] Qty: 1    Priority: Routine Class: Hospital Performed     Where does the patient receive follow up COPD care -> [place]          Follow up in -> 5-7 days         Released on: 11/29/2014 8:52 PM            Priority: Routine Class: Hospital Performed     Where does the patient receive follow up COPD care -> [place]          Follow up in -> 5-7 days

## 2014-12-01 NOTE — Telephone Encounter (Signed)
Are we trying to contact pt? Or pt contacting us? For what reason?

## 2014-12-01 NOTE — Telephone Encounter (Signed)
Pt needs new patient consult - has never been seen by Pulmonary.  Pt can be added on to see Dr Sherene SiresWert 12/09/14 for New Patient Consult Left detailed message x1 on pt voicemail to call back and schedule appt for Consult per Hospital MD.

## 2014-12-05 ENCOUNTER — Emergency Department (HOSPITAL_COMMUNITY): Payer: Commercial Managed Care - HMO

## 2014-12-05 ENCOUNTER — Inpatient Hospital Stay (HOSPITAL_COMMUNITY)
Admission: EM | Admit: 2014-12-05 | Discharge: 2014-12-08 | DRG: 191 | Disposition: A | Discharge status: Home / Self Care | Payer: Commercial Managed Care - HMO | Attending: Internal Medicine | Admitting: Internal Medicine

## 2014-12-05 ENCOUNTER — Encounter (HOSPITAL_COMMUNITY): Payer: Self-pay | Admitting: *Deleted

## 2014-12-05 DIAGNOSIS — I5042 Chronic combined systolic (congestive) and diastolic (congestive) heart failure: Secondary | ICD-10-CM | POA: Diagnosis present

## 2014-12-05 DIAGNOSIS — F17210 Nicotine dependence, cigarettes, uncomplicated: Secondary | ICD-10-CM | POA: Diagnosis present

## 2014-12-05 DIAGNOSIS — Z950 Presence of cardiac pacemaker: Secondary | ICD-10-CM

## 2014-12-05 DIAGNOSIS — T380X5A Adverse effect of glucocorticoids and synthetic analogues, initial encounter: Secondary | ICD-10-CM | POA: Diagnosis present

## 2014-12-05 DIAGNOSIS — E785 Hyperlipidemia, unspecified: Secondary | ICD-10-CM | POA: Diagnosis present

## 2014-12-05 DIAGNOSIS — Z954 Presence of other heart-valve replacement: Secondary | ICD-10-CM

## 2014-12-05 DIAGNOSIS — I2510 Atherosclerotic heart disease of native coronary artery without angina pectoris: Secondary | ICD-10-CM | POA: Diagnosis present

## 2014-12-05 DIAGNOSIS — N400 Enlarged prostate without lower urinary tract symptoms: Secondary | ICD-10-CM | POA: Diagnosis present

## 2014-12-05 DIAGNOSIS — Z720 Tobacco use: Secondary | ICD-10-CM

## 2014-12-05 DIAGNOSIS — T50905A Adverse effect of unspecified drugs, medicaments and biological substances, initial encounter: Secondary | ICD-10-CM | POA: Diagnosis present

## 2014-12-05 DIAGNOSIS — Z833 Family history of diabetes mellitus: Secondary | ICD-10-CM | POA: Diagnosis not present

## 2014-12-05 DIAGNOSIS — Z8249 Family history of ischemic heart disease and other diseases of the circulatory system: Secondary | ICD-10-CM

## 2014-12-05 DIAGNOSIS — Z809 Family history of malignant neoplasm, unspecified: Secondary | ICD-10-CM

## 2014-12-05 DIAGNOSIS — R0602 Shortness of breath: Secondary | ICD-10-CM | POA: Diagnosis present

## 2014-12-05 DIAGNOSIS — Z595 Extreme poverty: Secondary | ICD-10-CM

## 2014-12-05 DIAGNOSIS — I2584 Coronary atherosclerosis due to calcified coronary lesion: Secondary | ICD-10-CM

## 2014-12-05 DIAGNOSIS — G4700 Insomnia, unspecified: Secondary | ICD-10-CM | POA: Diagnosis present

## 2014-12-05 DIAGNOSIS — J441 Chronic obstructive pulmonary disease with (acute) exacerbation: Principal | ICD-10-CM | POA: Diagnosis present

## 2014-12-05 DIAGNOSIS — Z952 Presence of prosthetic heart valve: Secondary | ICD-10-CM | POA: Diagnosis not present

## 2014-12-05 DIAGNOSIS — R739 Hyperglycemia, unspecified: Secondary | ICD-10-CM | POA: Diagnosis present

## 2014-12-05 DIAGNOSIS — Z5900 Homelessness unspecified: Secondary | ICD-10-CM

## 2014-12-05 DIAGNOSIS — F319 Bipolar disorder, unspecified: Secondary | ICD-10-CM | POA: Diagnosis present

## 2014-12-05 DIAGNOSIS — M792 Neuralgia and neuritis, unspecified: Secondary | ICD-10-CM | POA: Diagnosis present

## 2014-12-05 HISTORY — DX: Chronic combined systolic (congestive) and diastolic (congestive) heart failure: I50.42

## 2014-12-05 LAB — CBC WITH DIFFERENTIAL/PLATELET
Basophils Absolute: 0 10*3/uL (ref 0.0–0.1)
Basophils Relative: 0 % (ref 0–1)
Eosinophils Absolute: 0.2 10*3/uL (ref 0.0–0.7)
Eosinophils Relative: 1 % (ref 0–5)
HCT: 45.2 % (ref 39.0–52.0)
Hemoglobin: 14.5 g/dL (ref 13.0–17.0)
Lymphocytes Relative: 16 % (ref 12–46)
Lymphs Abs: 2.3 10*3/uL (ref 0.7–4.0)
MCH: 30 pg (ref 26.0–34.0)
MCHC: 32.1 g/dL (ref 30.0–36.0)
MCV: 93.4 fL (ref 78.0–100.0)
Monocytes Absolute: 1.2 10*3/uL — ABNORMAL HIGH (ref 0.1–1.0)
Monocytes Relative: 8 % (ref 3–12)
Neutro Abs: 10.3 10*3/uL — ABNORMAL HIGH (ref 1.7–7.7)
Neutrophils Relative %: 75 % (ref 43–77)
Platelets: 284 10*3/uL (ref 150–400)
RBC: 4.84 MIL/uL (ref 4.22–5.81)
RDW: 15.9 % — ABNORMAL HIGH (ref 11.5–15.5)
WBC: 13.9 10*3/uL — ABNORMAL HIGH (ref 4.0–10.5)

## 2014-12-05 LAB — BASIC METABOLIC PANEL
Anion gap: 5 (ref 5–15)
BUN: 14 mg/dL (ref 6–23)
CO2: 28 mmol/L (ref 19–32)
Calcium: 9.3 mg/dL (ref 8.4–10.5)
Chloride: 107 mmol/L (ref 96–112)
Creatinine, Ser: 0.79 mg/dL (ref 0.50–1.35)
GFR calc Af Amer: >90 mL/min (ref >90)
GFR calc non Af Amer: >90 mL/min (ref >90)
Glucose, Bld: 142 mg/dL — ABNORMAL HIGH (ref 70–99)
Potassium: 3.8 mmol/L (ref 3.5–5.1)
Sodium: 140 mmol/L (ref 135–145)

## 2014-12-05 LAB — BRAIN NATRIURETIC PEPTIDE: B Natriuretic Peptide: 51 pg/mL (ref 0.0–100.0)

## 2014-12-05 MED ORDER — DIVALPROEX SODIUM ER 500 MG PO TB24
1500.0000 mg | ORAL_TABLET | Freq: Every day | ORAL | Status: DC
  Administered 2014-12-05 – 2014-12-07 (×2): 1500 mg via ORAL
  Filled 2014-12-05 (×3): qty 3

## 2014-12-05 MED ORDER — FUROSEMIDE 40 MG PO TABS
40.0000 mg | ORAL_TABLET | Freq: Every day | ORAL | Status: DC
  Administered 2014-12-05 – 2014-12-08 (×4): 40 mg via ORAL
  Filled 2014-12-05 (×4): qty 1

## 2014-12-05 MED ORDER — POLYETHYLENE GLYCOL 3350 17 G PO PACK: 17.0000 g | PACK | Freq: Every day | ORAL | Status: DC | PRN

## 2014-12-05 MED ORDER — ACETAMINOPHEN 325 MG PO TABS: 650.0000 mg | ORAL_TABLET | Freq: Four times a day (QID) | ORAL | Status: DC | PRN

## 2014-12-05 MED ORDER — ATORVASTATIN CALCIUM 40 MG PO TABS
40.0000 mg | ORAL_TABLET | Freq: Every day | ORAL | Status: DC
  Administered 2014-12-05 – 2014-12-07 (×3): 40 mg via ORAL
  Filled 2014-12-05 (×4): qty 1

## 2014-12-05 MED ORDER — DOXYCYCLINE HYCLATE 100 MG PO TABS
100.0000 mg | ORAL_TABLET | Freq: Two times a day (BID) | ORAL | Status: DC
  Administered 2014-12-05 – 2014-12-08 (×6): 100 mg via ORAL
  Filled 2014-12-05 (×6): qty 1

## 2014-12-05 MED ORDER — ONDANSETRON HCL 4 MG PO TABS: 4.0000 mg | ORAL_TABLET | Freq: Four times a day (QID) | ORAL | Status: DC | PRN

## 2014-12-05 MED ORDER — SODIUM CHLORIDE 0.9 % IV SOLN: INTRAVENOUS | Status: DC

## 2014-12-05 MED ORDER — TRAZODONE HCL 50 MG PO TABS: 50.0000 mg | ORAL_TABLET | Freq: Every evening | ORAL | Status: DC | PRN

## 2014-12-05 MED ORDER — ONDANSETRON HCL 4 MG/2ML IJ SOLN: 4.0000 mg | Freq: Four times a day (QID) | INTRAMUSCULAR | Status: DC | PRN

## 2014-12-05 MED ORDER — GABAPENTIN 400 MG PO CAPS
400.0000 mg | ORAL_CAPSULE | Freq: Three times a day (TID) | ORAL | Status: DC
  Administered 2014-12-05 – 2014-12-08 (×8): 400 mg via ORAL
  Filled 2014-12-05 (×8): qty 1

## 2014-12-05 MED ORDER — ALBUTEROL (5 MG/ML) CONTINUOUS INHALATION SOLN
10.0000 mg/h | INHALATION_SOLUTION | Freq: Once | RESPIRATORY_TRACT | Status: AC
  Administered 2014-12-05: 10 mg/h via RESPIRATORY_TRACT

## 2014-12-05 MED ORDER — HEPARIN SODIUM (PORCINE) 5000 UNIT/ML IJ SOLN
5000.0000 [IU] | Freq: Three times a day (TID) | INTRAMUSCULAR | Status: DC
  Administered 2014-12-05 – 2014-12-06 (×2): 5000 [IU] via SUBCUTANEOUS
  Filled 2014-12-05 (×4): qty 1

## 2014-12-05 MED ORDER — IPRATROPIUM-ALBUTEROL 0.5-2.5 (3) MG/3ML IN SOLN
3.0000 mL | RESPIRATORY_TRACT | Status: DC
  Administered 2014-12-05: 3 mL via RESPIRATORY_TRACT
  Filled 2014-12-05: qty 3

## 2014-12-05 MED ORDER — TAMSULOSIN HCL 0.4 MG PO CAPS
0.4000 mg | ORAL_CAPSULE | Freq: Every day | ORAL | Status: DC
  Administered 2014-12-06 – 2014-12-07 (×2): 0.4 mg via ORAL
  Filled 2014-12-05 (×2): qty 1

## 2014-12-05 MED ORDER — ALBUTEROL (5 MG/ML) CONTINUOUS INHALATION SOLN
15.0000 mg | INHALATION_SOLUTION | Freq: Once | RESPIRATORY_TRACT | Status: AC
  Administered 2014-12-05: 15 mg via RESPIRATORY_TRACT
  Filled 2014-12-05: qty 20

## 2014-12-05 MED ORDER — IPRATROPIUM-ALBUTEROL 0.5-2.5 (3) MG/3ML IN SOLN
3.0000 mL | Freq: Four times a day (QID) | RESPIRATORY_TRACT | Status: DC
  Administered 2014-12-06 – 2014-12-08 (×10): 3 mL via RESPIRATORY_TRACT
  Filled 2014-12-05 (×10): qty 3

## 2014-12-05 MED ORDER — NICOTINE 21 MG/24HR TD PT24
21.0000 mg | MEDICATED_PATCH | Freq: Every day | TRANSDERMAL | Status: DC
  Filled 2014-12-05 (×3): qty 1

## 2014-12-05 MED ORDER — ACETAMINOPHEN 650 MG RE SUPP: 650.0000 mg | Freq: Four times a day (QID) | RECTAL | Status: DC | PRN

## 2014-12-05 MED ORDER — OXYCODONE HCL 5 MG PO TABS: 5.0000 mg | ORAL_TABLET | ORAL | Status: DC | PRN

## 2014-12-05 MED ORDER — METHYLPREDNISOLONE SODIUM SUCC 125 MG IJ SOLR
60.0000 mg | Freq: Four times a day (QID) | INTRAMUSCULAR | Status: DC
  Administered 2014-12-05 – 2014-12-07 (×7): 60 mg via INTRAVENOUS
  Filled 2014-12-05 (×8): qty 2

## 2014-12-05 MED ORDER — ASPIRIN 81 MG PO CHEW
81.0000 mg | CHEWABLE_TABLET | Freq: Every day | ORAL | Status: DC
  Administered 2014-12-06 – 2014-12-08 (×3): 81 mg via ORAL
  Filled 2014-12-05 (×3): qty 1

## 2014-12-05 MED ORDER — METHYLPREDNISOLONE SODIUM SUCC 125 MG IJ SOLR
125.0000 mg | Freq: Once | INTRAMUSCULAR | Status: AC
  Administered 2014-12-05: 125 mg via INTRAVENOUS
  Filled 2014-12-05: qty 2

## 2014-12-05 MED ORDER — IPRATROPIUM-ALBUTEROL 0.5-2.5 (3) MG/3ML IN SOLN
3.0000 mL | RESPIRATORY_TRACT | Status: DC | PRN
  Administered 2014-12-07: 3 mL via RESPIRATORY_TRACT
  Filled 2014-12-05: qty 3

## 2014-12-05 NOTE — ED Notes (Signed)
Pt resting in bed. Stated he felt SOB. Pt's O2 sat at 91%. Pt breathing is nonlabored and regular with symmetrical rise and fall. Placed pt on 2L O2 via nasal cannula for comfort. Nad noted. Pt aware waiting on bed assignment for admission.

## 2014-12-05 NOTE — ED Notes (Signed)
Sob, seen here 2/2 for same  .  Epistaxis this am

## 2014-12-05 NOTE — ED Notes (Signed)
Ambulated around hall. Oxygen dropped to 88%, for the most part it stayed around 90%. Pulse was 92. Complained of being short of breath while walking.

## 2014-12-05 NOTE — ED Provider Notes (Signed)
CSN: [id number]     Arrival date & time 12/05/14  1234 History  This chart was scribed for Flint Melter, MD by Ronney Lion, ED Scribe. This patient was seen in room APA09/APA09 and the patient's care was started at 3:46 PM.    Chief Complaint  Patient presents with  . Shortness of Breath   The history is provided by the patient. No language interpreter was used.    HPI Comments: [name] [name] is a 69 y.o. male COPD who presents to the Emergency Department complaining of SOB ongoing for the past week. He complains of associated cough. Patient was prescribed Prednisone and a rescue inhaler when he was seen here for the same symptoms 6 days ago, but he has been unable to fill it because he lost his debit card. Patient had been using a nebulizer machine with an albuterol solution 4-6 times a day with no relief. He denies fever or vomiting.  He has been going to [facility]. Patient is an occasional smoker. Patient's phone number is [phone]    Past Medical History  Diagnosis Date  . Severe aortic valve stenosis     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 at [facility]  . COPD (chronic obstructive pulmonary disease)   . Thoracic ascending aortic aneurysm     repaired 06/2014 at [facility]  . COPD (chronic obstructive pulmonary disease)   . BPH (benign prostatic hyperplasia)   . Pacemaker     battery replaced 06/2014 at [facility]  . SSS (sick sinus syndrome)     s/p PPM   . CAD (coronary artery disease)     ?MI in 2007  . Pneumothorax     traumatic 2007  . Anxiety and depression   . Psychosis   . Bipolar disorder    Past Surgical History  Procedure Laterality Date  . Pacemaker placement  2007    battery replaced 07/04/2014  . Hiatal hernia repair    . Knee surgery      L5-6  . Lumbar fusion    . Aortic valve replacement      porcine valve  . Cardiac surgery      ascending thoracic aneurysm repair 06/2014   Family History  Problem Relation Age of Onset  . CAD Father   .  Diabetes Father   . High blood pressure Brother   . Cancer Brother    History  Substance Use Topics  . Smoking status: Current Some Day Smoker -- 1.00 packs/day    Types: Cigarettes  . Smokeless tobacco: Not on file  . Alcohol Use: Yes    Review of Systems  Constitutional: Negative for fever.  Respiratory: Positive for cough and shortness of breath.   Gastrointestinal: Negative for vomiting.  All other systems reviewed and are negative.     Allergies  Tiotropium bromide monohydrate and Morphine and related  Home Medications   Prior to Admission medications   Medication Sig Start Date End Date Taking? Authorizing Provider  aspirin 81 MG tablet Take 1 tablet (81 mg total) by mouth daily. 09/12/14  Yes Velna Hatchet May Agustin, NP  atorvastatin (LIPITOR) 40 MG tablet Take 1 tablet (40 mg total) by mouth daily at 6 PM. 10/04/14  Yes Richarda Overlie, MD  folic acid (FOLVITE) 1 MG tablet Take 1 tablet (1 mg total) by mouth daily. 10/04/14  Yes Richarda Overlie, MD  furosemide (LASIX) 40 MG tablet Take 1 tablet (40 mg total) by mouth daily. 09/12/14  Yes Velna Hatchet May Agustin,  NP  gabapentin (NEURONTIN) 400 MG capsule Take 1 capsule (400 mg total) by mouth 3 (three) times daily. 10/04/14  Yes Richarda Overlie, MD  hydrOXYzine (ATARAX/VISTARIL) 50 MG tablet Take 1 tablet (50 mg total) by mouth at bedtime. 10/04/14  Yes Richarda Overlie, MD  ipratropium-albuterol (DUONEB) 0.5-2.5 (3) MG/3ML SOLN Take 3 mLs by nebulization every 4 (four) hours as needed. Shortness of breath/wheezing 09/21/14  Yes Olivia Mackie, MD  thiamine 100 MG tablet Take 1 tablet (100 mg total) by mouth daily. 10/04/14  Yes Richarda Overlie, MD  albuterol (PROVENTIL HFA;VENTOLIN HFA) 108 (90 BASE) MCG/ACT inhaler Inhale 2 puffs into the lungs every 4 (four) hours as needed for wheezing or shortness of breath. 11/29/14   Suzi Roots, MD  divalproex (DEPAKOTE ER) 500 MG 24 hr tablet Take 3 tablets (1,500 mg total) by mouth at bedtime. Patient not  taking: Reported on 11/29/2014 10/04/14   Richarda Overlie, MD  mometasone-formoterol (DULERA) 200-5 MCG/ACT AERO Inhale 2 puffs into the lungs 2 (two) times daily. Patient not taking: Reported on 11/29/2014 09/12/14   [facility], NP  nicotine (NICODERM CQ - DOSED IN MG/24 HOURS) 14 mg/24hr patch Place 1 patch (14 mg total) onto the skin daily. Patient not taking: Reported on 11/29/2014 10/04/14   Richarda Overlie, MD  predniSONE (DELTASONE) 20 MG tablet Take 3 tablets (60 mg total) by mouth daily. 11/29/14   Suzi Roots, MD  tamsulosin (FLOMAX) 0.4 MG CAPS capsule Take 1 capsule (0.4 mg total) by mouth daily after supper. Patient not taking: Reported on 11/29/2014 10/04/14   Richarda Overlie, MD  traZODone (DESYREL) 50 MG tablet Take 1 tablet (50 mg total) by mouth at bedtime as needed for sleep. Patient not taking: Reported on 09/18/2014 09/12/14   Velna Hatchet May Agustin, NP   BP 107/68 mmHg  Pulse 80  Temp(Src) 97.6 F (36.4 C) (Oral)  Resp 15  Ht 5' 10.5" (1.791 m)  Wt 175 lb (79.379 kg)  BMI 24.75 kg/m2  SpO2 99% Physical Exam  Constitutional: He is oriented to person, place, and time. He appears well-developed and well-nourished.  HENT:  Head: Normocephalic and atraumatic.  Right Ear: External ear normal.  Left Ear: External ear normal.  Eyes: Conjunctivae and EOM are normal. Pupils are equal, round, and reactive to light.  Neck: Normal range of motion and phonation normal. Neck supple.  Cardiovascular: Normal rate, regular rhythm and normal heart sounds.   Pulmonary/Chest: He is in respiratory distress. He has decreased breath sounds. He has wheezes. He exhibits no bony tenderness.  Decreased air movement bilaterally, with generalized wheezing. In tripod position. Increased work of breathing.  Abdominal: Soft. There is no tenderness.  Musculoskeletal: Normal range of motion.  Neurological: He is alert and oriented to person, place, and time. No cranial nerve deficit or sensory deficit. He  exhibits normal muscle tone. Coordination normal.  Skin: Skin is warm, dry and intact.  Psychiatric: He has a normal mood and affect. His behavior is normal. Judgment and thought content normal.  Nursing note and vitals reviewed.   ED Course  Procedures (including critical care time)  DIAGNOSTIC STUDIES: Oxygen Saturation is 100% on room air, normal by my interpretation.    COORDINATION OF CARE: 3:52 PM - Discussed treatment plan with pt at bedside and pt agreed to plan.  Medications  0.9 %  sodium chloride infusion ( Intravenous Not Given 12/05/14 1839)  methylPREDNISolone sodium succinate (SOLU-MEDROL) 125 mg/2 mL injection 125 mg (125 mg  Intravenous Given 12/05/14 1628)  albuterol (PROVENTIL,VENTOLIN) solution continuous neb (15 mg Nebulization Given 12/05/14 1618)  albuterol (PROVENTIL,VENTOLIN) solution continuous neb (10 mg/hr Nebulization Given 12/05/14 1745)    Patient Vitals for the past 24 hrs:  BP Temp Temp src Pulse Resp SpO2 Height Weight  12/05/14 1922 107/68 mmHg - - 80 15 99 % - -  12/05/14 1830 107/70 mmHg - - 80 15 93 % - -  12/05/14 1800 119/72 mmHg - - 80 15 100 % - -  12/05/14 1747 - - - - - 90 % - -  12/05/14 1730 130/60 mmHg - - 82 16 (!) 89 % - -  12/05/14 1700 144/74 mmHg - - 80 21 100 % - -  12/05/14 1630 133/82 mmHg - - 80 21 100 % - -  12/05/14 1619 - - - - - 94 % - -  12/05/14 1240 99/78 mmHg 97.6 F (36.4 C) Oral - 20 100 % 5' 10.5" (1.791 m) 175 lb (79.379 kg)    5:25 PM Reevaluation with update and discussion. After initial assessment and treatment, an updated evaluation reveals he is resting in the semi-fowlers position.  At this time, lungs have improved air movement, but the movement is still poor.  Continues to have diffuse wheezing.Mancel Bale L   8:03 PM Reevaluation with update and discussion. After initial assessment and treatment, an updated evaluation reveals lung exam continues to have very poor air movement bilaterally.  During ambulation  on room air sat dropped to 88% and he was symptomatic with shortness of breath.  Findings discussed with patient, all questions answered. Sevrin Sally L   8:05 PM-Consult complete with hospitalist. Patient case explained and discussed.  He agrees to admit patient for further evaluation and treatment. Call ended at 2010    CRITICAL CARE Performed by: Flint Melter Total critical care time: 35 minutes Critical care time was exclusive of separately billable procedures and treating other patients. Critical care was necessary to treat or prevent imminent or life-threatening deterioration. Critical care was time spent personally by me on the following activities: development of treatment plan with patient and/or surrogate as well as nursing, discussions with consultants, evaluation of patient's response to treatment, examination of patient, obtaining history from patient or surrogate, ordering and performing treatments and interventions, ordering and review of laboratory studies, ordering and review of radiographic studies, pulse oximetry and re-evaluation of patient's condition.   Labs Review Labs Reviewed  CBC WITH DIFFERENTIAL/PLATELET - Abnormal; Notable for the following:    WBC 13.9 (*)    RDW 15.9 (*)    Neutro Abs 10.3 (*)    Monocytes Absolute 1.2 (*)    All other components within normal limits  BASIC METABOLIC PANEL - Abnormal; Notable for the following:    Glucose, Bld 142 (*)    All other components within normal limits    Imaging Review Dg Chest 2 View  12/05/2014   CLINICAL DATA:  Shortness of breath for 2-3 days.  COPD.  EXAM: CHEST  2 VIEW  COMPARISON:  Single view of the chest 09/01/2014.  FINDINGS: Surgical clips projecting over the right upper chest are again seen. The patient is status post aortic valve replacement with a pacing device in place. The chest is hyperexpanded with attenuation of the pulmonary vasculature but the lungs are clear. Heart size is normal. No  pneumothorax or pleural effusion. Remote healed right rib fractures are noted.  IMPRESSION: Emphysema postoperative change without acute disease.   Electronically Signed  By: Drusilla Kannerhomas  Dalessio M.D.   On: 12/05/2014 13:20     EKG Interpretation None      MDM   Final diagnoses:  COPD exacerbation  Tobacco abuse  Homelessness  Indigency    COPD exacerbation, related to persistent symptoms without access to medication for treatment.  He continues to smoke cigarettes as an aggravating factor as well.  Doubt serious bacterial infection.  Metabolic instability or ACS.  Nursing Notes Reviewed/ Care Coordinated Applicable Imaging Reviewed Interpretation of Laboratory Data incorporated into ED treatment   Plan: Admit  Flint MelterElliott L Mercer Peifer, MD 12/06/14 0006

## 2014-12-05 NOTE — ED Notes (Signed)
RT called

## 2014-12-05 NOTE — H&P (Signed)
[facility] History and Physical  [name] Booksndrew H Gasner ZOX:[id number] DOB: 09/06/1946 DOA: 12/05/2014  Referring physician: Dr Effie ShyWentz - [facility] PCP: No PCP Per Patient   Chief Complaint: SOB  HPI: [name] [name] is a 69 y.o. male  Patient presenting for persistent worsening shortness of breath. Started approximately 1 week ago. Seen here in the emergency room on February 2 for similar symptoms but was unable to fill his prescription for his inhaler and steroids. Patient states he is homeless. He lost his Depakote. Patient using an old nebulizer machine with albuterol 4-6 times a day without much relief. Cough is intermittently productive. Denies fevers, chest pain, nausea, vomiting, palpitations, headache, syncope,. Overall symptoms are getting worse. Decreased tobacco use during this time due to SOB. States he only smoked a cigarette in order to induce coughing so that he can get the phlegm out.  Review of Systems:  Constitutional:  No weight loss, night sweats, Fevers, chills, fatigue.  HEENT:  No headaches, Difficulty swallowing,Tooth/dental problems,Sore throat,  No sneezing, itching, ear ache, nasal congestion, post nasal drip,  Cardio-vascular:  No chest pain, Orthopnea, PND, swelling in lower extremities, anasarca, dizziness, palpitations  GI:  No heartburn, indigestion, abdominal pain, nausea, vomiting, diarrhea, change in bowel habits, loss of appetite  Resp:  Per HPI Skin:  no rash or lesions.  GU:  no dysuria, change in color of urine, no urgency or frequency. No flank pain.  Musculoskeletal:   No joint pain or swelling. No decreased range of motion. No back pain.  Psych:  No change in mood or affect. No depression or anxiety. No memory loss.   Past Medical History  Diagnosis Date  . Severe aortic valve stenosis     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  . COPD (chronic obstructive pulmonary disease)   . Thoracic ascending aortic aneurysm     [facility]  . COPD (chronic obstructive pulmonary disease)   . BPH (benign prostatic hyperplasia)   . Pacemaker     [facility]  . SSS (sick sinus syndrome)     s/p PPM   . CAD (coronary artery disease)     ?MI in 2007  . Pneumothorax     traumatic 2007  . Anxiety and depression   . Psychosis   . Bipolar disorder    Past Surgical History  Procedure Laterality Date  . Pacemaker placement  2007    battery replaced 07/04/2014  . Hiatal hernia repair    . Knee surgery      L5-6  . Lumbar fusion    . Aortic valve replacement      porcine valve  . Cardiac surgery      ascending thoracic aneurysm repair 06/2014   Social History:  reports that he has been smoking Cigarettes.  He has been smoking about 1.00 pack per day. He does not have any smokeless tobacco history on file. He reports that he drinks alcohol. He reports that he uses illicit drugs (Marijuana).  Allergies  Allergen Reactions  . Tiotropium Bromide Monohydrate Nausea Only and Anaphylaxis    Tremors  . Morphine And Related Other (See Comments)    Hallucinations    Family History  Problem Relation Age of Onset  . CAD Father   . Diabetes Father   . High blood pressure Brother   . Cancer Brother      Prior to Admission medications   Medication Sig Start Date End Date Taking? Authorizing  Provider  aspirin 81 MG tablet Take 1 tablet (81 mg total) by mouth daily. 09/12/14  Yes Velna Hatchet May Agustin, NP  atorvastatin (LIPITOR) 40 MG tablet Take 1 tablet (40 mg total) by mouth daily at 6 PM. 10/04/14  Yes Richarda Overlie, MD  folic acid (FOLVITE) 1 MG tablet Take 1 tablet (1 mg total) by mouth daily. 10/04/14  Yes Richarda Overlie, MD  furosemide (LASIX) 40 MG tablet Take 1 tablet (40 mg total) by mouth daily. 09/12/14  Yes Velna Hatchet May Agustin, NP  gabapentin (NEURONTIN) 400 MG capsule Take 1 capsule (400 mg total) by mouth 3 (three) times daily. 10/04/14  Yes Richarda Overlie, MD  hydrOXYzine (ATARAX/VISTARIL) 50 MG  tablet Take 1 tablet (50 mg total) by mouth at bedtime. 10/04/14  Yes Richarda Overlie, MD  ipratropium-albuterol (DUONEB) 0.5-2.5 (3) MG/3ML SOLN Take 3 mLs by nebulization every 4 (four) hours as needed. Shortness of breath/wheezing 09/21/14  Yes Olivia Mackie, MD  thiamine 100 MG tablet Take 1 tablet (100 mg total) by mouth daily. 10/04/14  Yes Richarda Overlie, MD  albuterol (PROVENTIL HFA;VENTOLIN HFA) 108 (90 BASE) MCG/ACT inhaler Inhale 2 puffs into the lungs every 4 (four) hours as needed for wheezing or shortness of breath. 11/29/14   Suzi Roots, MD  divalproex (DEPAKOTE ER) 500 MG 24 hr tablet Take 3 tablets (1,500 mg total) by mouth at bedtime. Patient not taking: Reported on 11/29/2014 10/04/14   Richarda Overlie, MD  mometasone-formoterol (DULERA) 200-5 MCG/ACT AERO Inhale 2 puffs into the lungs 2 (two) times daily. Patient not taking: Reported on 11/29/2014 09/12/14   [facility], NP  nicotine (NICODERM CQ - DOSED IN MG/24 HOURS) 14 mg/24hr patch Place 1 patch (14 mg total) onto the skin daily. Patient not taking: Reported on 11/29/2014 10/04/14   Richarda Overlie, MD  predniSONE (DELTASONE) 20 MG tablet Take 3 tablets (60 mg total) by mouth daily. 11/29/14   Suzi Roots, MD  tamsulosin (FLOMAX) 0.4 MG CAPS capsule Take 1 capsule (0.4 mg total) by mouth daily after supper. Patient not taking: Reported on 11/29/2014 10/04/14   Richarda Overlie, MD  traZODone (DESYREL) 50 MG tablet Take 1 tablet (50 mg total) by mouth at bedtime as needed for sleep. Patient not taking: Reported on 09/18/2014 09/12/14   [facility], NP   Physical Exam: Ceasar Mons Vitals:   12/05/14 1747 12/05/14 1800 12/05/14 1830 12/05/14 1922  BP:  119/72 107/70 107/68  Pulse:  80 80 80  Temp:      TempSrc:      Resp:  Height:      Weight:      SpO2: 90% 100% 93% 99%    Wt Readings from Last 3 Encounters:  12/05/14 79.379 kg (175 lb)  11/29/14 79.379 kg (175 lb)  10/04/14 76.295 kg (168 lb 3.2 oz)    General: mild  distress Eyes:  PERRL, normal lids, irises & conjunctiva ENT:  grossly normal hearing, lips & tongue Neck:  no LAD, masses or thyromegaly Cardiovascular: faint heart sounds, RRR, no m/r/g. No LE edema. Telemetry:  SR, no arrhythmias  Respiratory: Increased WOB. Breathing through pursed lips. Diminished breath sounds throughout. Wheezing throughout all lung fields. No rhonchi or crackles. Abdomen:  soft, ntnd Skin:  no rash or induration seen on limited exam Musculoskeletal:  grossly normal tone BUE/BLE Psychiatric:  grossly normal mood and affect, speech fluent and appropriate Neurologic:  grossly non-focal.  Labs on Admission:  Basic Metabolic Panel:  Recent Labs Lab 12/05/14 1509  NA 140  K 3.8  CL 107  CO2 28  GLUCOSE 142*  BUN 14  CREATININE 0.79  CALCIUM 9.3   Liver Function Tests: No results for input(s): AST, ALT, ALKPHOS, BILITOT, PROT, ALBUMIN in the last 168 hours. No results for input(s): LIPASE, AMYLASE in the last 168 hours. No results for input(s): AMMONIA in the last 168 hours. CBC:  Recent Labs Lab 12/05/14 1509  WBC 13.9*  NEUTROABS 10.3*  HGB 14.5  HCT 45.2  MCV 93.4  PLT 284   Cardiac Enzymes: No results for input(s): CKTOTAL, CKMB, CKMBINDEX, TROPONINI in the last 168 hours.  BNP (last 3 results) No results for input(s): BNP in the last 8760 hours.  ProBNP (last 3 results)  Recent Labs  09/01/14 1722 09/04/14 2121 09/24/14 0704  PROBNP 653.8* 333.0* 798.5*    CBG: No results for input(s): GLUCAP in the last 168 hours.  Radiological Exams on Admission: Dg Chest 2 View  12/05/2014   CLINICAL DATA:  Shortness of breath for 2-3 days.  COPD.  EXAM: CHEST  2 VIEW  COMPARISON:  Single view of the chest 09/01/2014.  FINDINGS: Surgical clips projecting over the right upper chest are again seen. The patient is status post aortic valve replacement with a pacing device in place. The chest is hyperexpanded with attenuation of the  pulmonary vasculature but the lungs are clear. Heart size is normal. No pneumothorax or pleural effusion. Remote healed right rib fractures are noted.  IMPRESSION: Emphysema postoperative change without acute disease.   Electronically Signed   By: Drusilla Kanner M.D.   On: 12/05/2014 13:20    EKG: Ordered  Assessment/Plan Principal Problem:   COPD exacerbation Active Problems:   BPH (benign prostatic hyperplasia)   CAD (coronary artery disease)   H/O aortic valve replacement   History of permanent cardiac pacemaker placement   Bipolar 1 disorder   Insomnia   Tobacco abuse   Homelessness   Chronic combined systolic and diastolic CHF (congestive heart failure)  EKG  COPD exacerbation: worsening for past 7 days. Unable to fill prescriptions due to loss of debt cards - pt homeless. CAT and Solumedrol w/ some improvement in ED. No sign of PNA. Likely triggered by recent weather changes and continued smoking.  - ABG - Solumedrol 60 Q6 - Duonebs Q4 - O2 PRN - Doxy  Homelessness:  - social work  Chronic acute and diastolic heart failure.: Last echo 2015 showing EF 40-45%. No signs of acute exacerbation. -continue home Lasix. - BNP  H/o bioprosthetic aortic valve:  - continue ASA  Bipolar: no sign of exacerbation - continue depakote  Tobacco: 1ppd - nicotine patch  HLD: - continue lipitor  Neuropathic pain: - continue neurontin  BPH: - continue flomax  Insomnia:  - trazodone QHS PRN  Code Status: FULL DVT Prophylaxis: Heparin Family Communication: none Disposition Plan: pending improvement  Corky Blumstein Shela Commons, MD Family Medicine [facility] [email or web address] Password [email or web address]

## 2014-12-05 NOTE — ED Notes (Signed)
RT paged for breathing treatment

## 2014-12-05 NOTE — Telephone Encounter (Signed)
The number we have listed is wrong. I attempted to call the other number we have listed and it is not in service.

## 2014-12-05 NOTE — ED Notes (Signed)
MD at bedside. 

## 2014-12-06 DIAGNOSIS — T50905A Adverse effect of unspecified drugs, medicaments and biological substances, initial encounter: Secondary | ICD-10-CM | POA: Diagnosis present

## 2014-12-06 DIAGNOSIS — R739 Hyperglycemia, unspecified: Secondary | ICD-10-CM

## 2014-12-06 LAB — COMPREHENSIVE METABOLIC PANEL
ALT: 13 U/L (ref 0–53)
AST: 18 U/L (ref 0–37)
Albumin: 3.2 g/dL — ABNORMAL LOW (ref 3.5–5.2)
Alkaline Phosphatase: 72 U/L (ref 39–117)
Anion gap: 4 — ABNORMAL LOW (ref 5–15)
BUN: 18 mg/dL (ref 6–23)
CO2: 29 mmol/L (ref 19–32)
Calcium: 8.5 mg/dL (ref 8.4–10.5)
Chloride: 106 mmol/L (ref 96–112)
Creatinine, Ser: 0.78 mg/dL (ref 0.50–1.35)
GFR calc Af Amer: >90 mL/min (ref >90)
GFR calc non Af Amer: >90 mL/min (ref >90)
Glucose, Bld: 157 mg/dL — ABNORMAL HIGH (ref 70–99)
Potassium: 4.2 mmol/L (ref 3.5–5.1)
Sodium: 139 mmol/L (ref 135–145)
Total Bilirubin: 0.4 mg/dL (ref 0.3–1.2)
Total Protein: 6.1 g/dL (ref 6.0–8.3)

## 2014-12-06 LAB — CBC
HCT: 40 % (ref 39.0–52.0)
Hemoglobin: 12.7 g/dL — ABNORMAL LOW (ref 13.0–17.0)
MCH: 29.9 pg (ref 26.0–34.0)
MCHC: 31.8 g/dL (ref 30.0–36.0)
MCV: 94.1 fL (ref 78.0–100.0)
Platelets: 243 10*3/uL (ref 150–400)
RBC: 4.25 MIL/uL (ref 4.22–5.81)
RDW: 15.8 % — ABNORMAL HIGH (ref 11.5–15.5)
WBC: 8.2 10*3/uL (ref 4.0–10.5)

## 2014-12-06 LAB — GLUCOSE, CAPILLARY
Glucose-Capillary: 137 mg/dL — ABNORMAL HIGH (ref 70–99)
Glucose-Capillary: 135 mg/dL — ABNORMAL HIGH (ref 70–99)

## 2014-12-06 MED ORDER — BENZONATATE 100 MG PO CAPS
100.0000 mg | ORAL_CAPSULE | Freq: Three times a day (TID) | ORAL | Status: DC
  Administered 2014-12-06 – 2014-12-08 (×7): 100 mg via ORAL
  Filled 2014-12-06 (×7): qty 1

## 2014-12-06 MED ORDER — INSULIN ASPART 100 UNIT/ML ~~LOC~~ SOLN
0.0000 [IU] | Freq: Three times a day (TID) | SUBCUTANEOUS | Status: DC
  Administered 2014-12-06: 5 [IU] via SUBCUTANEOUS
  Administered 2014-12-06: 2 [IU] via SUBCUTANEOUS
  Administered 2014-12-07: 3 [IU] via SUBCUTANEOUS
  Administered 2014-12-07: 5 [IU] via SUBCUTANEOUS

## 2014-12-06 MED ORDER — FAMOTIDINE 20 MG PO TABS
20.0000 mg | ORAL_TABLET | Freq: Every day | ORAL | Status: DC
  Administered 2014-12-06 – 2014-12-07 (×2): 20 mg via ORAL
  Filled 2014-12-06 (×3): qty 1

## 2014-12-06 MED ORDER — INSULIN ASPART 100 UNIT/ML ~~LOC~~ SOLN: 0.0000 [IU] | Freq: Three times a day (TID) | SUBCUTANEOUS | Status: DC

## 2014-12-06 MED ORDER — INSULIN ASPART 100 UNIT/ML ~~LOC~~ SOLN: 0.0000 [IU] | Freq: Every day | SUBCUTANEOUS | Status: DC

## 2014-12-06 NOTE — Progress Notes (Signed)
[facility] PROGRESS NOTE  Jennings Booksndrew H Samano ZOX:[id number] DOB: 08/05/1946 DOA: 12/05/2014 PCP: No PCP Per Patient    Code Status: Full code Family Communication: No family available Disposition Plan: Discharge to home in clinically appropriate   Consultants:  None  Procedures:  None  Antibiotics:  Rocephin 2/8>>  Azithromycin 2/8>>  HPI/Subjective: Patient says "I'm at  50%" regarding his improvement. He continues to have shortness of breath with exertion and a nonproductive cough here at he denies chest pain.  Objective: Filed Vitals:   12/06/14 0528  BP: 113/62  Pulse: 80  Temp: 97.8 F (36.6 C)  Resp:    oxygen saturation 92% on room air  Intake/Output Summary (Last 24 hours) at 12/06/14 1111 Last data filed at 12/06/14 0200  Gross per 24 hour  Intake    480 ml  Output    200 ml  Net    280 ml   Filed Weights   12/05/14 1240 12/06/14 0124  Weight: 79.379 kg (175 lb) 79.379 kg (175 lb)    Exam:   General:  Alert, very talkative 69 year old man sitting up in bed, in no acute distress.  Cardiovascular: S1, S2, with a soft systolic murmur in S2 click.  Respiratory: Mild diffuse wheezes bilaterally. Breathing mildly labored with talking, but nonlabored at rest.  Abdomen: Positive bowel sounds, soft, nontender, nondistended.  Musculoskeletal/extremities: Trace of pedal edema bilaterally. No acute hot red joints.  Neurologic/psychiatric: He is alert and oriented 3. He is very talkative, but not necessarily manic. Cranial nerves II through XII are intact.   Data Reviewed: Basic Metabolic Panel:  Recent Labs Lab 12/05/14 1509 12/06/14 0522  NA 140 139  K 3.8 4.2  CL 107 106  CO2 28 29  GLUCOSE 142* 157*  BUN 14 18  CREATININE 0.79 0.78  CALCIUM 9.3 8.5   Liver Function Tests:  Recent Labs Lab 12/06/14 0522  AST 18  ALT 13  ALKPHOS 72  BILITOT 0.4  PROT 6.1  ALBUMIN 3.2*   No results for input(s): LIPASE, AMYLASE in the last  168 hours. No results for input(s): AMMONIA in the last 168 hours. CBC:  Recent Labs Lab 12/05/14 1509 12/06/14 0522  WBC 13.9* 8.2  NEUTROABS 10.3*  --   HGB 14.5 12.7*  HCT 45.2 40.0  MCV 93.4 94.1  PLT 284 243   Cardiac Enzymes: No results for input(s): CKTOTAL, CKMB, CKMBINDEX, TROPONINI in the last 168 hours. BNP (last 3 results)  Recent Labs  12/05/14 2056  BNP 51.0    ProBNP (last 3 results)  Recent Labs  09/01/14 1722 09/04/14 2121 09/24/14 0704  PROBNP 653.8* 333.0* 798.5*    CBG: No results for input(s): GLUCAP in the last 168 hours.  No results found for this or any previous visit (from the past 240 hour(s)).   Studies: Dg Chest 2 View  12/05/2014   CLINICAL DATA:  Shortness of breath for 2-3 days.  COPD.  EXAM: CHEST  2 VIEW  COMPARISON:  Single view of the chest 09/01/2014.  FINDINGS: Surgical clips projecting over the right upper chest are again seen. The patient is status post aortic valve replacement with a pacing device in place. The chest is hyperexpanded with attenuation of the pulmonary vasculature but the lungs are clear. Heart size is normal. No pneumothorax or pleural effusion. Remote healed right rib fractures are noted.  IMPRESSION: Emphysema postoperative change without acute disease.   Electronically Signed   By: Drusilla Kannerhomas  Dalessio M.D.  On: 12/05/2014 13:20    Scheduled Meds: . aspirin  81 mg Oral Daily  . atorvastatin  40 mg Oral q1800  . divalproex  1,500 mg Oral QHS  . doxycycline  100 mg Oral Q12H  . furosemide  40 mg Oral Daily  . gabapentin  400 mg Oral TID  . heparin  5,000 Units Subcutaneous 3 times per day  . insulin aspart  0-15 Units Subcutaneous TID WC  . insulin aspart  0-5 Units Subcutaneous QHS  . ipratropium-albuterol  3 mL Nebulization QID  . methylPREDNISolone (SOLU-MEDROL) injection  60 mg Intravenous Q6H  . nicotine  21 mg Transdermal Daily  . tamsulosin  0.4 mg Oral QPC supper   Continuous Infusions: . sodium  chloride     Assessment and plan:  Principal Problem:   COPD exacerbation Active Problems:   H/O aortic valve replacement   Bipolar 1 disorder   Chronic combined systolic and diastolic CHF (congestive heart failure)   BPH (benign prostatic hyperplasia)   Coronary artery disease due to calcified coronary lesion   History of permanent cardiac pacemaker placement   Insomnia   Tobacco abuse   Homelessness   HLD (hyperlipidemia)   Neuropathic pain   Hyperglycemia, drug-induced  1. 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 The patient is stable. We'll continue IV Solu-Medrol, DuoNeb nebulizers, doxycycline, and oxygen. Will add Tessalon Perles 3 times a day. We'll add Pepcid while he is on Solu-Medrol.  Tobacco abuse. The patient was advised to stop smoking. Continue nicotine patch.  History of aortic valve replacement-porcine valve/chronic combined systolic and diastolic heart failure. Per echo in December 2015, his ejection fraction was 40-45% with evidence of hypokinesis and dyskinesis. Appears currently compensated. IV fluids have been decreased to [facility]. We'll continue Lasix.  Coronary artery disease. Currently stable. We'll continue statin and aspirin. He does not appear to be on a beta blocker chronically which would have been held anyway because of his wheezes.  Bipolar disorder. Currently stable. We'll continue Depakote.  Neuropathic pain. We'll continue gabapentin.  Steroid-induced hyperglycemia. Sliding scale NovoLog was added this morning.  Homelessness. The case manager and/or the social worker will be consulted for assistance.  Time spent: 35 minutes    [facility]  [facility] Pager [phone]. If 7PM-7AM, please contact night-coverage at [email or web address], password [email or web address] 12/06/2014, 11:11 AM  LOS: 1 day

## 2014-12-06 NOTE — Care Management Utilization Note (Signed)
UR completed 

## 2014-12-06 NOTE — Progress Notes (Signed)
INITIAL NUTRITION ASSESSMENT  DOCUMENTATION CODES Per approved criteria  -Not Applicable   INTERVENTION: -Gave snacks as requested  NUTRITION DIAGNOSIS: Increased needs related to a COPD exacerbation as evidenced by diagnosis  Goal: Pt to meet >/= 90% of their estimated nutrition needs   Monitor:  Intake, weight, labs, snack preferences  Reason for Assessment: Assessment of nutritional status  69 y.o. male  Admitting Dx: COPD exacerbation  ASSESSMENT: 69 year old presenting for persistent worsening shortness of breath  Spoke with patient who reports his appetite and weight have been stable for a while now. He reports following a healthier diet at home as well as working out on a fairly regular basis. He reports he is normally hypoglycemic and to compensate for this he eats ~6 smaller meals a day. He requested some snacks so he can continue this meal pattern.   Nutrition Focused Physical Exam: No wasting in any area   Height: Ht Readings from Last 1 Encounters:  12/06/14 5\' 10"  (1.778 m)    Weight: Wt Readings from Last 1 Encounters:  12/06/14 175 lb (79.379 kg)    Ideal Body Weight: 166  % Ideal Body Weight: 105%  Wt Readings from Last 10 Encounters:  12/06/14 175 lb (79.379 kg)  11/29/14 175 lb (79.379 kg)  10/04/14 168 lb 3.2 oz (76.295 kg)  09/20/14 165 lb (74.844 kg)  09/18/14 168 lb (76.204 kg)  09/18/14 168 lb (76.204 kg)  09/14/14 165 lb (74.844 kg)  09/12/14 167 lb (75.751 kg)    Usual Body Weight: 175  % Usual Body Weight:100%  BMI:  Body mass index is 25.11 kg/(m^2).  Estimated Nutritional Needs: Kcal: 1600-1975 Protein: 80-95 Fluid: 1600-1975  Skin: WDL  Diet Order: Diet Heart  EDUCATION NEEDS: -No education needs identified at this time   Intake/Output Summary (Last 24 hours) at 12/06/14 1007 Last data filed at 12/06/14 0200  Gross per 24 hour  Intake    480 ml  Output    200 ml  Net    280 ml    Last BM:  2/9  Labs:   Recent Labs Lab 12/05/14 1509 12/06/14 0522  NA 140 139  K 3.8 4.2  CL 107 106  CO2 28 29  BUN 14 18  CREATININE 0.79 0.78  CALCIUM 9.3 8.5  GLUCOSE 142* 157*    CBG (last 3)  No results for input(s): GLUCAP in the last 72 hours.  Scheduled Meds: . aspirin  81 mg Oral Daily  . atorvastatin  40 mg Oral q1800  . divalproex  1,500 mg Oral QHS  . doxycycline  100 mg Oral Q12H  . furosemide  40 mg Oral Daily  . gabapentin  400 mg Oral TID  . heparin  5,000 Units Subcutaneous 3 times per day  . insulin aspart  0-15 Units Subcutaneous TID WC  . insulin aspart  0-5 Units Subcutaneous QHS  . ipratropium-albuterol  3 mL Nebulization QID  . methylPREDNISolone (SOLU-MEDROL) injection  60 mg Intravenous Q6H  . nicotine  21 mg Transdermal Daily  . tamsulosin  0.4 mg Oral QPC supper    Continuous Infusions: . sodium chloride      Past Medical History  Diagnosis Date  . Severe aortic valve stenosis     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  . COPD (chronic obstructive pulmonary disease)   . Thoracic ascending aortic aneurysm     [facility]  . COPD (chronic obstructive pulmonary disease)   .  BPH (benign prostatic hyperplasia)   . Pacemaker     battery replaced 06/2014 at [facility]  . SSS (sick sinus syndrome)     s/p PPM   . CAD (coronary artery disease)     ?MI in 2007  . Pneumothorax     traumatic 2007  . Anxiety and depression   . Psychosis   . Bipolar disorder     Past Surgical History  Procedure Laterality Date  . Pacemaker placement  2007    battery replaced 07/04/2014  . Hiatal hernia repair    . Knee surgery      L5-6  . Lumbar fusion    . Aortic valve replacement      porcine valve  . Cardiac surgery      ascending thoracic aneurysm repair 06/2014    Christophe Louis RD, LDN Nutrition Pager: [phone] 12/06/2014 10:07 AM

## 2014-12-06 NOTE — Progress Notes (Signed)
Patient requesting to come off telemetry for a shower.  Dr. Sherrie MustacheFisher paged and asked to advise.

## 2014-12-06 NOTE — Telephone Encounter (Signed)
Called and spoke to pt. Informed pt that I was calling to make a HFU appt after he is d/c from hospital. Pt stated there is no way he is coming to [place] for an appt and for us to make an appt in [place]. Advised the pt we do not have an office in [place] and to inform his nurse and the rounding MD that he is only willing to see a provider in [place]. Pt verbalized understanding and denied any further questions or concerns at this time.

## 2014-12-06 NOTE — Progress Notes (Signed)
PT Cancellation Note  Patient Details Name: [name] [name] MRN: [id number] DOB: 06/25/1946   Cancelled Treatment:    Reason Eval/Treat Not Completed: PT screened, no needs identified, will sign off.  Pt up in room, just finished a shower.  Totally independent    Konrad PentaBrown, Naveah Brave L 12/06/2014, 3:41 PM

## 2014-12-06 NOTE — Progress Notes (Signed)
OT Cancellation Note  Patient Details Name: [name] [name] MRN: [id number] DOB: 05/13/1946   Cancelled Treatment:    Reason Eval/Treat Not Completed: OT screened, no needs identified, will sign off. Patient's chart reviewed. Patient is ambulating around hallway without difficulty. No deficits noted. No OT needs at this time. Thank-you for the referral.   Limmie PatriciaLaura Geriann Lafont, OTR/L,CBIS  [phone]  12/06/2014, 8:10 AM

## 2014-12-06 NOTE — Clinical Social Work Psychosocial (Signed)
Clinical Social Work Department BRIEF PSYCHOSOCIAL ASSESSMENT 12/06/2014  Patient:  [name] [name], [name] [name]     Account Number:  [id number]     Admit date:  12/05/2014  Clinical Social Worker:  Legrand Como  Date/Time:  12/06/2014 01:24 PM  Referred by:  CSW  Date Referred:  12/06/2014  Other Referral:   Interview type:  Patient Other interview type:    PSYCHOSOCIAL DATA Living Status:  ALONE Admitted from facility:   Level of care:   Primary support name:  no one Primary support relationship to patient:   Degree of support available:   Patient reports no supports.    CURRENT CONCERNS Current Concerns  Other - See comment   Other Concerns:   Housing issues    SOCIAL WORK ASSESSMENT / PLAN CSW met with patient who was alert and oriented.  CSW completed the PHQ-9 with patient. Patient scored a 7.  CSW completed GAD-7 with patient and patient scored 5.  Patient indicated that he had mental health assessments in the past and he does not feel he is in need of mental health services. Patient indicated he was previously scheduled for an appointment with a mental health provider through being discharged at [facility], but he did not keep the appointment.  Patient stated that he has been diagnosed with COPD for the past 15 years and that he is coping with his diagnosis the best way he can.  Patient stated that he has been sleeping in his truck because he lost his debit card.  He reported that prior to that he was living in various hotels while he looks for a property to purchase. Patient stated that most recently he stayed at the [facility] and that he had to leave because he ran out of cash and his new debit card has not arrived. Patient indicated that he receives a total of $3,200 per month from his social security and his check.  Patient stated that he has no supports as he has two sons but their relationship is estranged.  Patient stated that as soon as he receives his new debit card,  he will no longer have housing issues.   Assessment/plan status:  Psychosocial Support/Ongoing Assessment of Needs Other assessment/ plan:   Information/referral to community resources:    PATIENT'S/FAMILY'S RESPONSE TO PLAN OF CARE: Patient states that he is expecting his debit card between tomorrow and Thursday.  He stated that once he receives it he will no longer have housing issues.  Patient declines mental health services at this time.     [name], [place]

## 2014-12-06 NOTE — Progress Notes (Signed)
Report given to Doug SouMichel Ferguson on 300. Patient ready for transfer to telemetry floor.

## 2014-12-07 DIAGNOSIS — M792 Neuralgia and neuritis, unspecified: Secondary | ICD-10-CM

## 2014-12-07 LAB — GLUCOSE, CAPILLARY
Glucose-Capillary: 205 mg/dL — ABNORMAL HIGH (ref 70–99)
Glucose-Capillary: 203 mg/dL — ABNORMAL HIGH (ref 70–99)
Glucose-Capillary: 77 mg/dL (ref 70–99)
Glucose-Capillary: 158 mg/dL — ABNORMAL HIGH (ref 70–99)
Glucose-Capillary: 128 mg/dL — ABNORMAL HIGH (ref 70–99)

## 2014-12-07 MED ORDER — PREDNISONE 20 MG PO TABS
40.0000 mg | ORAL_TABLET | Freq: Every day | ORAL | Status: DC
  Administered 2014-12-07 – 2014-12-08 (×2): 40 mg via ORAL
  Filled 2014-12-07 (×2): qty 2

## 2014-12-07 MED ORDER — GUAIFENESIN ER 600 MG PO TB12
1200.0000 mg | ORAL_TABLET | Freq: Two times a day (BID) | ORAL | Status: DC
  Administered 2014-12-07 – 2014-12-08 (×2): 1200 mg via ORAL
  Filled 2014-12-07 (×2): qty 2

## 2014-12-07 MED ORDER — ALBUTEROL SULFATE (2.5 MG/3ML) 0.083% IN NEBU
2.5000 mg | INHALATION_SOLUTION | RESPIRATORY_TRACT | Status: DC | PRN
  Administered 2014-12-07 – 2014-12-08 (×2): 2.5 mg via RESPIRATORY_TRACT
  Filled 2014-12-07 (×2): qty 3

## 2014-12-07 NOTE — Progress Notes (Signed)
Visited patient by request because he had not gotten ordered snacks. Re-obtained preferences and will talk to kitchen to verify they are ordered.   [name] LouisNathan [name] RD, LDN Nutrition Pager: [phone] 12/07/2014 9:32 AM

## 2014-12-07 NOTE — Progress Notes (Signed)
[facility] PROGRESS NOTE  Jennings Booksndrew H Bies [id number] DOB: 02/19/1946 DOA: 12/05/2014 PCP: No PCP Per Patient  Assessment/Plan: 1. COPD exacerbation. Appears to be clinically improving. No further wheezing. Will discontinue Solu-Medrol and start prednisone. Continue bronchodilators and antibiotics. Continue pulmonary hygiene 2. Acute respiratory failure. Weaned off of oxygen and is back on room air. Related to #1. 3. History of aortic valve replacement, porcine valve 4. Chronic combined systolic and diastolic congestive heart failure. Appears compensated at this time. Continue Lasix. 5. Bipolar disorder. Continue Depakote.  6. neuropathic pain. Continue gabapentin.  Code Status: full code Family Communication: no family present Disposition Plan: discharge home once improved   Consultants:    Procedures:    Antibiotics:  Doxycycline 2/8>>  HPI/Subjective: Patient still feels short of breath. He has nonproductive cough. No wheezing.  Objective: Filed Vitals:   12/07/14 1422  BP: 117/68  Pulse: 79  Temp: 98.1 F (36.7 C)  Resp: 20    Intake/Output Summary (Last 24 hours) at 12/07/14 1716 Last data filed at 12/07/14 1422  Gross per 24 hour  Intake   1320 ml  Output   2600 ml  Net  -1280 ml   Filed Weights   12/05/14 1240 12/06/14 0124 12/07/14 0545  Weight: 79.379 kg (175 lb) 79.379 kg (175 lb) 81.874 kg (180 lb 8 oz)    Exam:   General:  NAD  Cardiovascular: S1, S2 RRR  Respiratory: diminished breath sounds bilaterally with no wheezing  Abdomen: soft, nt, nd, bs+  Musculoskeletal: no edema b/l   Data Reviewed: Basic Metabolic Panel:  Recent Labs Lab 12/05/14 1509 12/06/14 0522  NA 140 139  K 3.8 4.2  CL 107 106  CO2 28 29  GLUCOSE 142* 157*  BUN 14 18  CREATININE 0.79 0.78  CALCIUM 9.3 8.5   Liver Function Tests:  Recent Labs Lab 12/06/14 0522  AST 18  ALT 13  ALKPHOS 72  BILITOT 0.4  PROT 6.1  ALBUMIN 3.2*   No  results for input(s): LIPASE, AMYLASE in the last 168 hours. No results for input(s): AMMONIA in the last 168 hours. CBC:  Recent Labs Lab 12/05/14 1509 12/06/14 0522  WBC 13.9* 8.2  NEUTROABS 10.3*  --   HGB 14.5 12.7*  HCT 45.2 40.0  MCV 93.4 94.1  PLT 284 243   Cardiac Enzymes: No results for input(s): CKTOTAL, CKMB, CKMBINDEX, TROPONINI in the last 168 hours. BNP (last 3 results)  Recent Labs  12/05/14 2056  BNP 51.0    ProBNP (last 3 results)  Recent Labs  09/01/14 1722 09/04/14 2121 09/24/14 0704  PROBNP 653.8* 333.0* 798.5*    CBG:  Recent Labs Lab 12/06/14 1652 12/06/14 2203 12/07/14 0746 12/07/14 1147 12/07/14 1630  GLUCAP 137* 135* 203* 77 158*    No results found for this or any previous visit (from the past 240 hour(s)).   Studies: No results found.  Scheduled Meds: . aspirin  81 mg Oral Daily  . atorvastatin  40 mg Oral q1800  . benzonatate  100 mg Oral TID  . divalproex  1,500 mg Oral QHS  . doxycycline  100 mg Oral Q12H  . famotidine  20 mg Oral Daily  . furosemide  40 mg Oral Daily  . gabapentin  400 mg Oral TID  . heparin  5,000 Units Subcutaneous 3 times per day  . insulin aspart  0-15 Units Subcutaneous TID WC  . insulin aspart  0-5 Units Subcutaneous QHS  . ipratropium-albuterol  3  mL Nebulization QID  . nicotine  21 mg Transdermal Daily  . predniSONE  40 mg Oral Q breakfast  . tamsulosin  0.4 mg Oral QPC supper   Continuous Infusions: . sodium chloride      Principal Problem:   COPD exacerbation Active Problems:   BPH (benign prostatic hyperplasia)   Coronary artery disease due to calcified coronary lesion   H/O aortic valve replacement   History of permanent cardiac pacemaker placement   Bipolar 1 disorder   Insomnia   Tobacco abuse   Homelessness   Chronic combined systolic and diastolic CHF (congestive heart failure)   HLD (hyperlipidemia)   Neuropathic pain   Hyperglycemia, drug-induced    Time spent:     Fia Hebert  [facility] Pager [phone]. If 7PM-7AM, please contact night-coverage at [email or web address], password [facility] 12/07/2014, 5:16 PM  LOS: 2 days

## 2014-12-07 NOTE — Clinical Social Work Note (Signed)
CSW finds no further social work needs.  Please see consult not for further details.  CSW signing off.   Tretha SciaraHeather Celia Gibbons, [place] [phone]

## 2014-12-07 NOTE — Progress Notes (Signed)
Patient ambulated approximately 50 ft in the hallway without an assistive device. Patient 02 sats ranged from 92-94% on room air. Patient tolerated walk well. Patient O2 sats was 94% on room air at rest.

## 2014-12-08 LAB — GLUCOSE, CAPILLARY
Glucose-Capillary: 98 mg/dL (ref 70–99)
Glucose-Capillary: 109 mg/dL — ABNORMAL HIGH (ref 70–99)

## 2014-12-08 MED ORDER — IPRATROPIUM-ALBUTEROL 0.5-2.5 (3) MG/3ML IN SOLN: 3.0000 mL | RESPIRATORY_TRACT | Status: DC | PRN

## 2014-12-08 MED ORDER — GUAIFENESIN ER 600 MG PO TB12: 600.0000 mg | ORAL_TABLET | Freq: Two times a day (BID) | ORAL | Status: DC

## 2014-12-08 MED ORDER — BENZONATATE 100 MG PO CAPS: 100.0000 mg | ORAL_CAPSULE | Freq: Three times a day (TID) | ORAL | Status: DC | PRN

## 2014-12-08 MED ORDER — PREDNISONE 10 MG PO TABS: ORAL_TABLET | ORAL | Status: DC

## 2014-12-08 MED ORDER — TAMSULOSIN HCL 0.4 MG PO CAPS: 0.4000 mg | ORAL_CAPSULE | Freq: Every day | ORAL | Status: DC

## 2014-12-08 MED ORDER — ALBUTEROL SULFATE HFA 108 (90 BASE) MCG/ACT IN AERS: 2.0000 | INHALATION_SPRAY | RESPIRATORY_TRACT | Status: DC | PRN

## 2014-12-08 NOTE — Progress Notes (Signed)
Patient discharged home today.  Patient was given discharge instructions, prescriptions, and care notes.  Patient  verbalized understanding with no complaints or concerns voiced at this time.  IV was removed with catheter intact, no bleeding or complications.  Patient left unit in stable condition by staff member in a wheelchair.

## 2014-12-08 NOTE — Discharge Summary (Signed)
Physician Discharge Summary  [name] [name] WJX:[id number] DOB: [birth year] DOA: 12/05/2014  PCP: No PCP Per Patient  Admit date: 12/05/2014 Discharge date: 12/08/2014  Time spent: 40 minutes  Recommendations for Outpatient Follow-up:  1. Follow up with primary care physician in 1-2 weeks  Discharge Diagnoses:  Principal Problem:   COPD exacerbation Active Problems:   BPH (benign prostatic hyperplasia)   Coronary artery disease due to calcified coronary lesion   H/O aortic valve replacement   History of permanent cardiac pacemaker placement   Bipolar 1 disorder   Insomnia   Tobacco abuse   Homelessness   Chronic combined systolic and diastolic CHF (congestive heart failure)   HLD (hyperlipidemia)   Neuropathic pain   Hyperglycemia, drug-induced   Discharge Condition: improved  Diet recommendation: low salt  Filed Weights   12/06/14 0124 12/07/14 0545 12/08/14 0538  Weight: 79.379 kg (175 lb) 81.874 kg (180 lb 8 oz) 77.293 kg (170 lb 6.4 oz)    History of present illness:  This patient was admitted to the hospital with progressive shortness of breath. He was found to have a COPD exacerbation and was admitted to the hospital for further treatments.  Hospital Course:  Patient was started on standard therapy with intravenous steroids, antibiotics and bronchodilators. He has slowly improved over the last several days back to his baseline. He is a bleeding on room air without any difficulty. His oxygen saturations are staying above 94%. He does not have any shortness of breath on ambulation. He is in place on a prednisone taper and continued on bronchodilators. He's been advised to quit smoking.  The patient does have bipolar disorder. He was continued on Depakote in the hospital. He does not appear to have any signs of psychosis at this time. He is advised to follow-up with his psychiatrist.  The remainder of his medical issues have remained  stable.  Procedures:    Consultations:    Discharge Exam: Filed Vitals:   12/08/14 0538  BP: 111/56  Pulse: 85  Temp: 97.4 F (36.3 C)  Resp: 20    General: NAD Cardiovascular: S1, s2 RRR Respiratory: CTA B  Discharge Instructions   Discharge Instructions    Call MD for:  difficulty breathing, headache or visual disturbances    Complete by:  As directed      Call MD for:  temperature >100.4    Complete by:  As directed      Diet - low sodium heart healthy    Complete by:  As directed      Increase activity slowly    Complete by:  As directed           Discharge Medication List as of 12/08/2014  2:43 PM    START taking these medications   Details  benzonatate (TESSALON) 100 MG capsule Take 1 capsule (100 mg total) by mouth 3 (three) times daily as needed for cough., Starting 12/08/2014, Until Discontinued, Print    guaiFENesin (MUCINEX) 600 MG 12 hr tablet Take 1 tablet (600 mg total) by mouth 2 (two) times daily., Starting 12/08/2014, Until Discontinued, Print      CONTINUE these medications which have CHANGED   Details  albuterol (PROVENTIL HFA;VENTOLIN HFA) 108 (90 BASE) MCG/ACT inhaler Inhale 2 puffs into the lungs every 4 (four) hours as needed for wheezing or shortness of breath., Starting 12/08/2014, Until Discontinued, Print    ipratropium-albuterol (DUONEB) 0.5-2.5 (3) MG/3ML SOLN Take 3 mLs by nebulization every 4 (four) hours as needed. Shortness  of breath/wheezing, Starting 12/08/2014, Until Discontinued, Print    predniSONE (DELTASONE) 10 MG tablet Take  po daily for 2 days, then  po daily for 2 days then  po daily for 2 days then  po daily for 2 days then stop, Print    tamsulosin (FLOMAX) 0.4 MG CAPS capsule Take 1 capsule (0.4 mg total) by mouth daily after supper., Starting 12/08/2014, Until Discontinued, Print      CONTINUE these medications which have NOT CHANGED   Details  aspirin 81 MG tablet Take 1 tablet (81 mg total) by  mouth daily., Starting 09/12/2014, Until Discontinued, No Print    atorvastatin (LIPITOR) 40 MG tablet Take 1 tablet (40 mg total) by mouth daily at 6 PM., Starting 10/04/2014, Until Discontinued, Normal    folic acid (FOLVITE) 1 MG tablet Take 1 tablet (1 mg total) by mouth daily., Starting 10/04/2014, Until Discontinued, Normal    furosemide (LASIX) 40 MG tablet Take 1 tablet (40 mg total) by mouth daily., Starting 09/12/2014, Until Discontinued, No Print    gabapentin (NEURONTIN) 400 MG capsule Take 1 capsule (400 mg total) by mouth 3 (three) times daily., Starting 10/04/2014, Until Discontinued, Normal    hydrOXYzine (ATARAX/VISTARIL) 50 MG tablet Take 1 tablet (50 mg total) by mouth at bedtime., Starting 10/04/2014, Until Discontinued, Normal    thiamine 100 MG tablet Take 1 tablet (100 mg total) by mouth daily., Starting 10/04/2014, Until Discontinued, Normal    divalproex (DEPAKOTE ER) 500 MG 24 hr tablet Take 3 tablets (1,500 mg total) by mouth at bedtime., Starting 10/04/2014, Until Discontinued, Normal    traZODone (DESYREL) 50 MG tablet Take 1 tablet (50 mg total) by mouth at bedtime as needed for sleep., Starting 09/12/2014, Until Discontinued, No Print      STOP taking these medications     mometasone-formoterol (DULERA) 200-5 MCG/ACT AERO      nicotine (NICODERM CQ - DOSED IN MG/24 HOURS) 14 mg/24hr patch        Allergies  Allergen Reactions  . Tiotropium Bromide Monohydrate Nausea Only and Anaphylaxis    Tremors  . Morphine And Related Other (See Comments)    Hallucinations   Follow-up Information    Follow up with follow up with primary care physician in 1-2 weeks.      Follow up with HAWKINS,EDWARD L, MD.   Specialty:  Pulmonary Disease   Why:  call for appointment in 2 weeks   Contact information:   [address] [place] [address] [phone]        The results of significant diagnostics from this hospitalization (including imaging,  microbiology, ancillary and laboratory) are listed below for reference.    Significant Diagnostic Studies: Dg Chest 2 View  12/05/2014   CLINICAL DATA:  Shortness of breath for 2-3 days.  COPD.  EXAM: CHEST  2 VIEW  COMPARISON:  Single view of the chest 09/01/2014.  FINDINGS: Surgical clips projecting over the right upper chest are again seen. The patient is status post aortic valve replacement with a pacing device in place. The chest is hyperexpanded with attenuation of the pulmonary vasculature but the lungs are clear. Heart size is normal. No pneumothorax or pleural effusion. Remote healed right rib fractures are noted.  IMPRESSION: Emphysema postoperative change without acute disease.   Electronically Signed   By: Drusilla Kanner M.D.   On: 12/05/2014 13:20    Microbiology: No results found for this or any previous visit (from the past 240 hour(s)).  Labs: Basic Metabolic Panel:  Recent Labs Lab 12/05/14 1509 12/06/14 0522  NA 140 139  K 3.8 4.2  CL 107 106  CO2 28 29  GLUCOSE 142* 157*  BUN 14 18  CREATININE 0.79 0.78  CALCIUM 9.3 8.5   Liver Function Tests:  Recent Labs Lab 12/06/14 0522  AST 18  ALT 13  ALKPHOS 72  BILITOT 0.4  PROT 6.1  ALBUMIN 3.2*   No results for input(s): LIPASE, AMYLASE in the last 168 hours. No results for input(s): AMMONIA in the last 168 hours. CBC:  Recent Labs Lab 12/05/14 1509 12/06/14 0522  WBC 13.9* 8.2  NEUTROABS 10.3*  --   HGB 14.5 12.7*  HCT 45.2 40.0  MCV 93.4 94.1  PLT 284 243   Cardiac Enzymes: No results for input(s): CKTOTAL, CKMB, CKMBINDEX, TROPONINI in the last 168 hours. BNP: BNP (last 3 results)  Recent Labs  12/05/14 2056  BNP 51.0    ProBNP (last 3 results)  Recent Labs  09/01/14 1722 09/04/14 2121 09/24/14 0704  PROBNP 653.8* 333.0* 798.5*    CBG:  Recent Labs Lab 12/07/14 1147 12/07/14 1630 12/07/14 2054 12/08/14 0753 12/08/14 1117  GLUCAP 77 158* 128* 98 109*        Signed:  Rajanee Schuelke  [facility] 12/08/2014, 8:03 PM

## 2014-12-08 NOTE — Care Management Note (Signed)
    Page 1 of 1   12/13/2014     5:10:09 PM CARE MANAGEMENT NOTE 12/13/2014  Patient:  [name] [name],[name] [name]   Account Number:  [id number]  Date Initiated:  12/07/2014  Documentation initiated by:  [name] [name],[name] [name]  Subjective/Objective Assessment:   Admitted with dyspnea/ COPD. Pt is from home, but says he lives out off his truck.He lost his apartment, but per CSW he makes enough money  to pay to move into another, he just has not done so. Pt asking MD for assist with meds but has ins.     Action/Plan:   12/08/14 Spoke with and he is fine with buying his meds- he just is waiting for his debit card in the mail. He lost the other one down beside the seat in his truck.   Anticipated DC Date:  12/08/2014   Anticipated DC Plan:  HOME/SELF CARE      DC Planning Services  CM consult      PAC Choice  DURABLE MEDICAL EQUIPMENT   Choice offered to / List presented to:  [name]   DME arranged  NEBULIZER MACHINE      DME agency  [facility]        Status of service:  Completed, signed off Medicare Important Message given?  YES (If response is "NO", the following Medicare IM given date fields will be blank) Date Medicare IM given:  12/08/2014 Medicare IM given by:  [name] [name],Pernell Dikes Date Additional Medicare IM given:   Additional Medicare IM given by:    Discharge Disposition:    Per UR Regulation:    If discussed at Long Length of Stay Meetings, dates discussed:    Comments:  12/08/14 1600 [name] HendersonGeneva Tanmay Halteman RN/CM Pt requested a nebulizer that is portable to use in his  truck and camper, in which he is living. Order placed and [name] [name] is checking to see if they can do this, since pt is noted to be homeless in the chart. Pt states he is not really homeless, as he lives in his truck and camper, by choice, and this reported to [facility].  They are working on this!

## 2014-12-15 ENCOUNTER — Encounter (HOSPITAL_COMMUNITY): Payer: Self-pay

## 2014-12-15 ENCOUNTER — Inpatient Hospital Stay (HOSPITAL_COMMUNITY)
Admission: EM | Admit: 2014-12-15 | Discharge: 2014-12-17 | DRG: 190 | Disposition: A | Discharge status: Home / Self Care | Payer: Medicare HMO | Attending: Internal Medicine | Admitting: Internal Medicine

## 2014-12-15 ENCOUNTER — Emergency Department (HOSPITAL_COMMUNITY): Payer: Medicare HMO

## 2014-12-15 DIAGNOSIS — F319 Bipolar disorder, unspecified: Secondary | ICD-10-CM | POA: Diagnosis present

## 2014-12-15 DIAGNOSIS — Z952 Presence of prosthetic heart valve: Secondary | ICD-10-CM | POA: Diagnosis not present

## 2014-12-15 DIAGNOSIS — R0602 Shortness of breath: Secondary | ICD-10-CM | POA: Diagnosis present

## 2014-12-15 DIAGNOSIS — Z8249 Family history of ischemic heart disease and other diseases of the circulatory system: Secondary | ICD-10-CM

## 2014-12-15 DIAGNOSIS — I2510 Atherosclerotic heart disease of native coronary artery without angina pectoris: Secondary | ICD-10-CM | POA: Diagnosis present

## 2014-12-15 DIAGNOSIS — J189 Pneumonia, unspecified organism: Secondary | ICD-10-CM | POA: Diagnosis present

## 2014-12-15 DIAGNOSIS — Z950 Presence of cardiac pacemaker: Secondary | ICD-10-CM | POA: Diagnosis not present

## 2014-12-15 DIAGNOSIS — Z833 Family history of diabetes mellitus: Secondary | ICD-10-CM

## 2014-12-15 DIAGNOSIS — I5022 Chronic systolic (congestive) heart failure: Secondary | ICD-10-CM | POA: Diagnosis present

## 2014-12-15 DIAGNOSIS — F17210 Nicotine dependence, cigarettes, uncomplicated: Secondary | ICD-10-CM | POA: Diagnosis present

## 2014-12-15 DIAGNOSIS — N400 Enlarged prostate without lower urinary tract symptoms: Secondary | ICD-10-CM | POA: Diagnosis present

## 2014-12-15 DIAGNOSIS — J441 Chronic obstructive pulmonary disease with (acute) exacerbation: Principal | ICD-10-CM | POA: Diagnosis present

## 2014-12-15 DIAGNOSIS — Y95 Nosocomial condition: Secondary | ICD-10-CM | POA: Diagnosis present

## 2014-12-15 DIAGNOSIS — Z7982 Long term (current) use of aspirin: Secondary | ICD-10-CM | POA: Diagnosis not present

## 2014-12-15 DIAGNOSIS — J9601 Acute respiratory failure with hypoxia: Secondary | ICD-10-CM | POA: Diagnosis present

## 2014-12-15 DIAGNOSIS — D72825 Bandemia: Secondary | ICD-10-CM

## 2014-12-15 DIAGNOSIS — D72829 Elevated white blood cell count, unspecified: Secondary | ICD-10-CM | POA: Insufficient documentation

## 2014-12-15 DIAGNOSIS — Z7952 Long term (current) use of systemic steroids: Secondary | ICD-10-CM

## 2014-12-15 HISTORY — DX: Heart failure, unspecified: I50.9

## 2014-12-15 HISTORY — DX: Homelessness: Z59.0

## 2014-12-15 HISTORY — DX: Tobacco use: Z72.0

## 2014-12-15 HISTORY — DX: Neuralgia and neuritis, unspecified: M79.2

## 2014-12-15 HISTORY — DX: Homelessness unspecified: Z59.00

## 2014-12-15 LAB — CBC WITH DIFFERENTIAL/PLATELET
Basophils Absolute: 0 10*3/uL (ref 0.0–0.1)
Basophils Relative: 0 % (ref 0–1)
Eosinophils Absolute: 0.2 10*3/uL (ref 0.0–0.7)
Eosinophils Relative: 2 % (ref 0–5)
HCT: 42.7 % (ref 39.0–52.0)
Hemoglobin: 13.5 g/dL (ref 13.0–17.0)
Lymphocytes Relative: 18 % (ref 12–46)
Lymphs Abs: 2.2 10*3/uL (ref 0.7–4.0)
MCH: 29.2 pg (ref 26.0–34.0)
MCHC: 31.6 g/dL (ref 30.0–36.0)
MCV: 92.4 fL (ref 78.0–100.0)
Monocytes Absolute: 2 10*3/uL — ABNORMAL HIGH (ref 0.1–1.0)
Monocytes Relative: 16 % — ABNORMAL HIGH (ref 3–12)
Neutro Abs: 7.8 10*3/uL — ABNORMAL HIGH (ref 1.7–7.7)
Neutrophils Relative %: 64 % (ref 43–77)
Platelets: 243 10*3/uL (ref 150–400)
RBC: 4.62 MIL/uL (ref 4.22–5.81)
RDW: 15.3 % (ref 11.5–15.5)
WBC Morphology: INCREASED
WBC: 12.2 10*3/uL — ABNORMAL HIGH (ref 4.0–10.5)

## 2014-12-15 LAB — BASIC METABOLIC PANEL
Anion gap: 6 (ref 5–15)
BUN: 14 mg/dL (ref 6–23)
CO2: 25 mmol/L (ref 19–32)
Calcium: 8.6 mg/dL (ref 8.4–10.5)
Chloride: 105 mmol/L (ref 96–112)
Creatinine, Ser: 0.79 mg/dL (ref 0.50–1.35)
GFR calc Af Amer: >90 mL/min (ref >90)
GFR calc non Af Amer: >90 mL/min (ref >90)
Glucose, Bld: 86 mg/dL (ref 70–99)
Potassium: 3.9 mmol/L (ref 3.5–5.1)
Sodium: 136 mmol/L (ref 135–145)

## 2014-12-15 LAB — TROPONIN I: Troponin I: <0.03 ng/mL (ref <0.031)

## 2014-12-15 LAB — BRAIN NATRIURETIC PEPTIDE: B Natriuretic Peptide: 58 pg/mL (ref 0.0–100.0)

## 2014-12-15 MED ORDER — FOLIC ACID 1 MG PO TABS
1.0000 mg | ORAL_TABLET | Freq: Every day | ORAL | Status: DC
Start: 2014-12-15 — End: 2014-12-15

## 2014-12-15 MED ORDER — FUROSEMIDE 40 MG PO TABS: 40.0000 mg | ORAL_TABLET | Freq: Every day | ORAL | Status: DC

## 2014-12-15 MED ORDER — IPRATROPIUM-ALBUTEROL 0.5-2.5 (3) MG/3ML IN SOLN
RESPIRATORY_TRACT | Status: AC
  Filled 2014-12-15: qty 3

## 2014-12-15 MED ORDER — METHYLPREDNISOLONE SODIUM SUCC 125 MG IJ SOLR
125.0000 mg | Freq: Once | INTRAMUSCULAR | Status: AC
  Administered 2014-12-15: 125 mg via INTRAVENOUS
  Filled 2014-12-15: qty 2

## 2014-12-15 MED ORDER — ASPIRIN EC 81 MG PO TBEC: 81.0000 mg | DELAYED_RELEASE_TABLET | Freq: Every day | ORAL | Status: DC

## 2014-12-15 MED ORDER — TAMSULOSIN HCL 0.4 MG PO CAPS
0.4000 mg | ORAL_CAPSULE | Freq: Every day | ORAL | Status: DC
  Administered 2014-12-16: 0.4 mg via ORAL
  Filled 2014-12-15: qty 1

## 2014-12-15 MED ORDER — IPRATROPIUM BROMIDE 0.02 % IN SOLN
1.0000 mg | Freq: Once | RESPIRATORY_TRACT | Status: AC
  Administered 2014-12-15: 1 mg via RESPIRATORY_TRACT
  Filled 2014-12-15: qty 5

## 2014-12-15 MED ORDER — LEVOFLOXACIN 500 MG PO TABS
500.0000 mg | ORAL_TABLET | ORAL | Status: DC
  Administered 2014-12-15 – 2014-12-16 (×2): 500 mg via ORAL
  Filled 2014-12-15 (×2): qty 1

## 2014-12-15 MED ORDER — HEPARIN SODIUM (PORCINE) 5000 UNIT/ML IJ SOLN
5000.0000 [IU] | Freq: Three times a day (TID) | INTRAMUSCULAR | Status: DC
  Filled 2014-12-15 (×3): qty 1

## 2014-12-15 MED ORDER — FUROSEMIDE 40 MG PO TABS
40.0000 mg | ORAL_TABLET | Freq: Every day | ORAL | Status: DC
  Administered 2014-12-16 – 2014-12-17 (×2): 40 mg via ORAL
  Filled 2014-12-15 (×2): qty 1

## 2014-12-15 MED ORDER — FOLIC ACID 1 MG PO TABS
1.0000 mg | ORAL_TABLET | Freq: Every day | ORAL | Status: DC
  Administered 2014-12-16 – 2014-12-17 (×2): 1 mg via ORAL
  Filled 2014-12-15 (×2): qty 1

## 2014-12-15 MED ORDER — ONDANSETRON HCL 4 MG/2ML IJ SOLN: 4.0000 mg | Freq: Four times a day (QID) | INTRAMUSCULAR | Status: DC | PRN

## 2014-12-15 MED ORDER — SODIUM CHLORIDE 0.9 % IV SOLN
INTRAVENOUS | Status: AC
  Administered 2014-12-15: 23:00:00 via INTRAVENOUS

## 2014-12-15 MED ORDER — METHYLPREDNISOLONE SODIUM SUCC 125 MG IJ SOLR: 125.0000 mg | Freq: Four times a day (QID) | INTRAMUSCULAR | Status: DC

## 2014-12-15 MED ORDER — ASPIRIN EC 81 MG PO TBEC
81.0000 mg | DELAYED_RELEASE_TABLET | Freq: Every day | ORAL | Status: DC
  Administered 2014-12-16 – 2014-12-17 (×2): 81 mg via ORAL
  Filled 2014-12-15 (×2): qty 1

## 2014-12-15 MED ORDER — LEVOFLOXACIN 500 MG PO TABS: 500.0000 mg | ORAL_TABLET | Freq: Every day | ORAL | Status: DC

## 2014-12-15 MED ORDER — IPRATROPIUM-ALBUTEROL 0.5-2.5 (3) MG/3ML IN SOLN
3.0000 mL | Freq: Four times a day (QID) | RESPIRATORY_TRACT | Status: DC
  Administered 2014-12-15 – 2014-12-16 (×2): 3 mL via RESPIRATORY_TRACT
  Filled 2014-12-15 (×2): qty 3

## 2014-12-15 MED ORDER — ALBUTEROL SULFATE HFA 108 (90 BASE) MCG/ACT IN AERS
2.0000 | INHALATION_SPRAY | RESPIRATORY_TRACT | Status: DC | PRN
  Filled 2014-12-15: qty 6.7

## 2014-12-15 MED ORDER — ALBUTEROL (5 MG/ML) CONTINUOUS INHALATION SOLN
10.0000 mg/h | INHALATION_SOLUTION | Freq: Once | RESPIRATORY_TRACT | Status: AC
  Administered 2014-12-15: 10 mg/h via RESPIRATORY_TRACT
  Filled 2014-12-15: qty 20

## 2014-12-15 MED ORDER — TAMSULOSIN HCL 0.4 MG PO CAPS: 0.4000 mg | ORAL_CAPSULE | Freq: Every day | ORAL | Status: DC

## 2014-12-15 MED ORDER — METHYLPREDNISOLONE SODIUM SUCC 125 MG IJ SOLR
125.0000 mg | Freq: Four times a day (QID) | INTRAMUSCULAR | Status: DC
  Administered 2014-12-15 – 2014-12-16 (×2): 125 mg via INTRAVENOUS
  Filled 2014-12-15 (×2): qty 2

## 2014-12-15 MED ORDER — ALBUTEROL SULFATE (2.5 MG/3ML) 0.083% IN NEBU
2.5000 mg | INHALATION_SOLUTION | RESPIRATORY_TRACT | Status: DC
  Filled 2014-12-15: qty 3

## 2014-12-15 MED ORDER — IPRATROPIUM-ALBUTEROL 0.5-2.5 (3) MG/3ML IN SOLN: 3.0000 mL | RESPIRATORY_TRACT | Status: DC | PRN

## 2014-12-15 MED ORDER — ONDANSETRON HCL 4 MG PO TABS: 4.0000 mg | ORAL_TABLET | Freq: Four times a day (QID) | ORAL | Status: DC | PRN

## 2014-12-15 MED ORDER — GABAPENTIN 400 MG PO CAPS
400.0000 mg | ORAL_CAPSULE | Freq: Three times a day (TID) | ORAL | Status: DC
  Administered 2014-12-15 – 2014-12-17 (×4): 400 mg via ORAL
  Filled 2014-12-15 (×7): qty 1

## 2014-12-15 MED ORDER — ALBUTEROL SULFATE (2.5 MG/3ML) 0.083% IN NEBU
2.5000 mg | INHALATION_SOLUTION | RESPIRATORY_TRACT | Status: DC | PRN
  Administered 2014-12-15 – 2014-12-16 (×2): 2.5 mg via RESPIRATORY_TRACT
  Filled 2014-12-15: qty 3

## 2014-12-15 NOTE — ED Notes (Signed)
Pt states he is unhappy with the cone triage priority. Pt states he has been waiting while "other less urgent needs" were taken before him while he was "struggling to breathe". Pt wants the number to someone who is over all the emergency departments so he can voice his concern. Pt has no complaints with care once he is in a room.

## 2014-12-15 NOTE — ED Provider Notes (Signed)
CSN: [id number]     Arrival date & time 12/15/14  1507 History   First MD Initiated Contact with Patient 12/15/14 1623     Chief Complaint  Patient presents with  . Shortness of Breath      HPI Pt was seen at 1625.  Per pt, c/o gradual onset and worsening of persistent cough, wheezing and SOB for the past 2 weeks.  Describes his symptoms as "I have really bad COPD." Has been using home MDI and nebs without relief. States he has now "run out of my neb solution because I've been using it so much." States he needed to stop twice on the way to the ED to give himself a neb treatment in the car. States he was admitted to the hospital 2 weeks ago for same symptoms.  Denies CP/palpitations, no back pain, no abd pain, no N/V/D, no fevers, no rash.     Past Medical History  Diagnosis Date  . Severe aortic valve stenosis     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 at [facility]  . COPD (chronic obstructive pulmonary disease)   . Thoracic ascending aortic aneurysm     repaired 06/2014 at [facility]  . COPD (chronic obstructive pulmonary disease)   . BPH (benign prostatic hyperplasia)   . Pacemaker     battery replaced 06/2014 at [facility]  . SSS (sick sinus syndrome)     s/p PPM   . CAD (coronary artery disease)     ?MI in 2007  . Pneumothorax     traumatic 2007  . Anxiety and depression   . Psychosis   . Bipolar disorder   . CHF (congestive heart failure)   . Neuropathic pain   . Homelessness   . Tobacco abuse    Past Surgical History  Procedure Laterality Date  . Pacemaker placement  2007    battery replaced 07/04/2014  . Hiatal hernia repair    . Knee surgery      L5-6  . Lumbar fusion    . Aortic valve replacement      porcine valve  . Cardiac surgery      ascending thoracic aneurysm repair 06/2014   Family History  Problem Relation Age of Onset  . CAD Father   . Diabetes Father   . High blood pressure Brother   . Cancer Brother    History  Substance Use Topics  . Smoking status:  Current Some Day Smoker -- 1.00 packs/day    Types: Cigarettes  . Smokeless tobacco: Not on file  . Alcohol Use: Yes     Comment: occ    Review of Systems ROS: Statement: All systems negative except as marked or noted in the HPI; Constitutional: Negative for fever and chills. ; ; Eyes: Negative for eye pain, redness and discharge. ; ; ENMT: Negative for ear pain, hoarseness, nasal congestion, sinus pressure and sore throat. ; ; Cardiovascular: Negative for chest pain, palpitations, diaphoresis, and peripheral edema. ; ; Respiratory: +cough, wheezing, SOB. Negative for stridor. ; ; Gastrointestinal: Negative for nausea, vomiting, diarrhea, abdominal pain, blood in stool, hematemesis, jaundice and rectal bleeding. . ; ; Genitourinary: Negative for dysuria, flank pain and hematuria. ; ; Musculoskeletal: Negative for back pain and neck pain. Negative for swelling and trauma.; ; Skin: Negative for pruritus, rash, abrasions, blisters, bruising and skin lesion.; ; Neuro: Negative for headache, lightheadedness and neck stiffness. Negative for weakness, altered level of consciousness , altered mental status, extremity weakness, paresthesias, involuntary movement, seizure and syncope.  Allergies  Tiotropium bromide monohydrate and Morphine and related  Home Medications   Prior to Admission medications   Medication Sig Start Date End Date Taking? Authorizing Provider  albuterol (PROVENTIL HFA;VENTOLIN HFA) 108 (90 BASE) MCG/ACT inhaler Inhale 2 puffs into the lungs every 4 (four) hours as needed for wheezing or shortness of breath. 12/08/14   Erick Blinks, MD  aspirin 81 MG tablet Take 1 tablet (81 mg total) by mouth daily. 09/12/14   Velna Hatchet May Agustin, NP  atorvastatin (LIPITOR) 40 MG tablet Take 1 tablet (40 mg total) by mouth daily at 6 PM. 10/04/14   Richarda Overlie, MD  benzonatate (TESSALON) 100 MG capsule Take 1 capsule (100 mg total) by mouth 3 (three) times daily as needed for cough. 12/08/14    Erick Blinks, MD  divalproex (DEPAKOTE ER) 500 MG 24 hr tablet Take 3 tablets (1,500 mg total) by mouth at bedtime. Patient not taking: Reported on 11/29/2014 10/04/14   Richarda Overlie, MD  folic acid (FOLVITE) 1 MG tablet Take 1 tablet (1 mg total) by mouth daily. 10/04/14   Richarda Overlie, MD  furosemide (LASIX) 40 MG tablet Take 1 tablet (40 mg total) by mouth daily. 09/12/14   Velna Hatchet May Agustin, NP  gabapentin (NEURONTIN) 400 MG capsule Take 1 capsule (400 mg total) by mouth 3 (three) times daily. 10/04/14   Richarda Overlie, MD  guaiFENesin (MUCINEX) 600 MG 12 hr tablet Take 1 tablet (600 mg total) by mouth 2 (two) times daily. 12/08/14   Erick Blinks, MD  hydrOXYzine (ATARAX/VISTARIL) 50 MG tablet Take 1 tablet (50 mg total) by mouth at bedtime. 10/04/14   Richarda Overlie, MD  ipratropium-albuterol (DUONEB) 0.5-2.5 (3) MG/3ML SOLN Take 3 mLs by nebulization every 4 (four) hours as needed. Shortness of breath/wheezing 12/08/14   Erick Blinks, MD  predniSONE (DELTASONE) 10 MG tablet Take  po daily for 2 days, then  po daily for 2 days then  po daily for 2 days then  po daily for 2 days then stop 12/08/14   Erick Blinks, MD  tamsulosin (FLOMAX) 0.4 MG CAPS capsule Take 1 capsule (0.4 mg total) by mouth daily after supper. 12/08/14   Erick Blinks, MD  thiamine 100 MG tablet Take 1 tablet (100 mg total) by mouth daily. 10/04/14   Richarda Overlie, MD  traZODone (DESYREL) 50 MG tablet Take 1 tablet (50 mg total) by mouth at bedtime as needed for sleep. Patient not taking: Reported on 09/18/2014 09/12/14   Velna Hatchet May Agustin, NP   BP 115/79 mmHg  Pulse 87  Temp(Src) 98.5 F (36.9 C) (Oral)  Resp 24  Wt 170 lb (77.111 kg)  SpO2 99% Physical Exam  1630: Physical examination:  Nursing notes reviewed; Vital signs and O2 SAT reviewed;  Constitutional: Well developed, Well nourished, Well hydrated, Uncomfortable appearing.; Head:  Normocephalic, atraumatic; Eyes: EOMI, PERRL, No scleral icterus;  ENMT: Mouth and pharynx normal, Mucous membranes moist; Neck: Supple, Full range of motion, No lymphadenopathy; Cardiovascular: Regular rate and rhythm, No gallop; Respiratory: Breath sounds diminished & equal bilaterally, faint scattered wheezes with audible wheezing.  Speaking phrases. Standing and leaning forward over siderail of stretcher. Tachypneic. ; Chest: Nontender, Movement normal; Abdomen: Soft, Nontender, Nondistended, Normal bowel sounds; Genitourinary: No CVA tenderness; Extremities: Pulses normal, No tenderness, No edema, No calf edema or asymmetry.; Neuro: AA&Ox3, Major CN grossly intact.  Speech clear. No gross focal motor or sensory deficits in extremities. Climbs on and off stretcher easily by himself. Gait steady.; Skin: Color  normal, Warm, Dry.   ED Course  Procedures     EKG Interpretation   Date/Time:  Thursday December 15 2014 15:16:55 EST Ventricular Rate:  85 PR Interval:  156 QRS Duration: 166 QT Interval:  440 QTC Calculation: 523 R Axis:   -32 Text Interpretation:  Atrial-sensed ventricular-paced rhythm Abnormal ECG  No significant change since last tracing Confirmed by Bebe ShaggyWICKLINE  MD, Dorinda HillNALD  [phone]) on 12/15/2014 4:14:21 PM      MDM  MDM Reviewed: previous chart, nursing note and vitals Reviewed previous: labs and ECG Interpretation: labs, ECG and x-ray Total time providing critical care: 30-74 minutes. This excludes time spent performing separately reportable procedures and services. Consults: admitting MD   CRITICAL CARE Performed by: Laray AngerMCMANUS,Daylon Lafavor M Total critical care time: 35 Critical care time was exclusive of separately billable procedures and treating other patients. Critical care was necessary to treat or prevent imminent or life-threatening deterioration. Critical care was time spent personally by me on the following activities: development of treatment plan with patient and/or surrogate as well as nursing, discussions with consultants,  evaluation of patient's response to treatment, examination of patient, obtaining history from patient or surrogate, ordering and performing treatments and interventions, ordering and review of laboratory studies, ordering and review of radiographic studies, pulse oximetry and re-evaluation of patient's condition.   Results for orders placed or performed during the hospital encounter of 12/15/14  CBC with Differential  Result Value Ref Range   WBC 12.2 (H) 4.0 - 10.5 K/uL   RBC 4.62 4.22 - 5.81 MIL/uL   Hemoglobin 13.5 13.0 - 17.0 g/dL   HCT 29.542.7 62.139.0 - 30.852.0 %   MCV 92.4 78.0 - 100.0 fL   MCH 29.2 26.0 - 34.0 pg   MCHC 31.6 30.0 - 36.0 g/dL   RDW 65.715.3 84.611.5 - 96.215.5 %   Platelets 243 150 - 400 K/uL   Neutrophils Relative % 64 43 - 77 %   Lymphocytes Relative 18 12 - 46 %   Monocytes Relative 16 (H) 3 - 12 %   Eosinophils Relative 2 0 - 5 %   Basophils Relative 0 0 - 1 %   Neutro Abs 7.8 (H) 1.7 - 7.7 K/uL   Lymphs Abs 2.2 0.7 - 4.0 K/uL   Monocytes Absolute 2.0 (H) 0.1 - 1.0 K/uL   Eosinophils Absolute 0.2 0.0 - 0.7 K/uL   Basophils Absolute 0.0 0.0 - 0.1 K/uL   WBC Morphology INCREASED BANDS (>20% BANDS)    Smear Review LARGE PLATELETS PRESENT   Basic metabolic panel  Result Value Ref Range   Sodium 136 135 - 145 mmol/L   Potassium 3.9 3.5 - 5.1 mmol/L   Chloride 105 96 - 112 mmol/L   CO2 25 19 - 32 mmol/L   Glucose, Bld 86 70 - 99 mg/dL   BUN 14 6 - 23 mg/dL   Creatinine, Ser 9.520.79 0.50 - 1.35 mg/dL   Calcium 8.6 8.4 - 84.110.5 mg/dL   GFR calc non Af Amer >90 >90 mL/min   GFR calc Af Amer >90 >90 mL/min   Anion gap 6 5 - 15  Troponin I  Result Value Ref Range   Troponin I <0.03 <0.031 ng/mL  Brain natriuretic peptide  Result Value Ref Range   B Natriuretic Peptide 58.0 0.0 - 100.0 pg/mL   Dg Chest 2 View 12/15/2014   CLINICAL DATA:  Shortness of breath  EXAM: CHEST  2 VIEW  COMPARISON:  12/05/2014  FINDINGS: Stable heart size and  aortic contours post aortic valve replacement.  Stable positioning of dual-chamber pacer leads from the left. There is chronic pulmonary hyperinflation and bronchial wall thickening. There is no edema, consolidation, effusion, or pneumothorax. Left nipple shadow is noted. Status post surgery to the right chest; remote right-sided rib fractures.  IMPRESSION: COPD without acute superimposed finding.   Electronically Signed   By: Marnee Spring M.D.   On: 12/15/2014 16:07    1835:  Pt standing on arrival: tachypneic, audible wheezing. Hour long neb and IV solumedrol given. Pt now able to sit down on stretcher, though lungs remain diminished with intermittent audible wheezing. Dx and testing d/w pt.  Questions answered.  Verb understanding, agreeable to admit.  T/C to [facility]. Karilyn Cota, case discussed, including:  HPI, pertinent PM/SHx, VS/PE, dx testing, ED course and treatment:  Agreeable to admit, requests to write temporary orders, obtain medical bed to team APAdmits.     Samuel Jester, DO 12/17/14 [phone]

## 2014-12-15 NOTE — H&P (Signed)
[facility] History and Physical  [name] Booksndrew H Rey UJW:[id number] DOB: 06/15/1946 DOA: 12/15/2014  Referring physician: ER PCP: No PCP Per Patient   Chief Complaint: Dyspnea  HPI: [name] [name] is a 69 y.o. male  This is a 69 year old man who comes in complaining of dyspnea for the last 24-48 hours. He was recently hospitalized approximately 10 days ago with an exacerbation of COPD. He does smoke but sparingly apparently. He has not had any significant productive cough since discharge from the hospital on his last admission. He has had no fever. He says he is a [place] vet. He is now being admitted because his wheezing is not improved throughout his emergency room stay today.   Review of Systems:  Apart from symptoms above, all systems negative.  Past Medical History  Diagnosis Date  . Severe aortic valve stenosis     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  . COPD (chronic obstructive pulmonary disease)   . Thoracic ascending aortic aneurysm     [facility]  . COPD (chronic obstructive pulmonary disease)   . BPH (benign prostatic hyperplasia)   . Pacemaker     [facility]  . SSS (sick sinus syndrome)     s/p PPM   . CAD (coronary artery disease)     ?MI in 2007  . Pneumothorax     traumatic 2007  . Anxiety and depression   . Psychosis   . Bipolar disorder   . CHF (congestive heart failure)   . Neuropathic pain   . Homelessness   . Tobacco abuse    Past Surgical History  Procedure Laterality Date  . Pacemaker placement  2007    battery replaced 07/04/2014  . Hiatal hernia repair    . Knee surgery      L5-6  . Lumbar fusion    . Aortic valve replacement      porcine valve  . Cardiac surgery      ascending thoracic aneurysm repair 06/2014   Social History:  reports that he has been smoking Cigarettes.  He has been smoking about 1.00 pack per day. He does not have any smokeless tobacco history on file. He reports that  he drinks alcohol. He reports that he uses illicit drugs (Marijuana).  Allergies  Allergen Reactions  . Tiotropium Bromide Monohydrate Nausea Only and Anaphylaxis    Tremors  . Morphine And Related Other (See Comments)    Hallucinations    Family History  Problem Relation Age of Onset  . CAD Father   . Diabetes Father   . High blood pressure Brother   . Cancer Brother      Prior to Admission medications   Medication Sig Start Date End Date Taking? Authorizing Provider  albuterol (PROVENTIL HFA;VENTOLIN HFA) 108 (90 BASE) MCG/ACT inhaler Inhale 2 puffs into the lungs every 4 (four) hours as needed for wheezing or shortness of breath. 12/08/14  Yes Erick BlinksJehanzeb Memon, MD  aspirin EC 81 MG tablet Take 81 mg by mouth daily.   Yes Historical Provider, MD  folic acid (FOLVITE) 1 MG tablet Take 1 tablet (1 mg total) by mouth daily. 10/04/14  Yes Richarda OverlieNayana Abrol, MD  furosemide (LASIX) 40 MG tablet Take 1 tablet (40 mg total) by mouth daily. 09/12/14  Yes Velna HatchetSheila May Agustin, NP  gabapentin (NEURONTIN) 400 MG capsule Take 1 capsule (400 mg total) by mouth 3 (three) times daily. 10/04/14  Yes Richarda OverlieNayana Abrol, MD  ipratropium-albuterol (DUONEB) 0.5-2.5 (  3) MG/3ML SOLN Take 3 mLs by nebulization every 4 (four) hours as needed. Shortness of breath/wheezing 12/08/14  Yes Erick Blinks, MD  tamsulosin (FLOMAX) 0.4 MG CAPS capsule Take 1 capsule (0.4 mg total) by mouth daily after supper. 12/08/14  Yes Erick Blinks, MD  aspirin 81 MG tablet Take 1 tablet (81 mg total) by mouth daily. 09/12/14   Velna Hatchet May Agustin, NP  atorvastatin (LIPITOR) 40 MG tablet Take 1 tablet (40 mg total) by mouth daily at 6 PM. Patient not taking: Reported on 12/15/2014 10/04/14   Richarda Overlie, MD  benzonatate (TESSALON) 100 MG capsule Take 1 capsule (100 mg total) by mouth 3 (three) times daily as needed for cough. Patient not taking: Reported on 12/15/2014 12/08/14   Erick Blinks, MD  divalproex (DEPAKOTE ER) 500 MG 24 hr tablet Take 3  tablets (1,500 mg total) by mouth at bedtime. Patient not taking: Reported on 11/29/2014 10/04/14   Richarda Overlie, MD  guaiFENesin (MUCINEX) 600 MG 12 hr tablet Take 1 tablet (600 mg total) by mouth 2 (two) times daily. Patient not taking: Reported on 12/15/2014 12/08/14   Erick Blinks, MD  hydrOXYzine (ATARAX/VISTARIL) 50 MG tablet Take 1 tablet (50 mg total) by mouth at bedtime. Patient not taking: Reported on 12/15/2014 10/04/14   Richarda Overlie, MD  predniSONE (DELTASONE) 10 MG tablet Take  po daily for 2 days, then  po daily for 2 days then  po daily for 2 days then  po daily for 2 days then stop Patient not taking: Reported on 12/15/2014 12/08/14   Erick Blinks, MD  thiamine 100 MG tablet Take 1 tablet (100 mg total) by mouth daily. Patient not taking: Reported on 12/15/2014 10/04/14   Richarda Overlie, MD  traZODone (DESYREL) 50 MG tablet Take 1 tablet (50 mg total) by mouth at bedtime as needed for sleep. Patient not taking: Reported on 09/18/2014 09/12/14   [facility], NP   Physical Exam: Ceasar Mons Vitals:   12/15/14 1800 12/15/14 1858 12/15/14 1930 12/15/14 1945  BP: 135/67     Pulse: 84 93 91 93  Temp:      TempSrc:      Resp: Weight:      SpO2: 98% 97% 92% 92%    Wt Readings from Last 3 Encounters:  12/15/14 77.111 kg (170 lb)  12/08/14 77.293 kg (170 lb 6.4 oz)  11/29/14 79.379 kg (175 lb)    General:  Appears calm and comfortable. There is no increased work of breathing at rest. There is no peripheral or central cyanosis. Eyes: PERRL, normal lids, irises & conjunctiva ENT: grossly normal hearing, lips & tongue Neck: no LAD, masses or thyromegaly Cardiovascular: RRR, no m/r/g. No LE edema. Telemetry: SR, no arrhythmias  Respiratory: Air entry is reduced but he is tight in his chest with wheezing. There are no crackles or bronchial breathing. Abdomen: soft, ntnd Skin: no rash or induration seen on limited exam Musculoskeletal: grossly normal tone  BUE/BLE Psychiatric: grossly normal mood and affect, speech fluent and appropriate Neurologic: grossly non-focal.          Labs on Admission:  Basic Metabolic Panel:  Recent Labs Lab 12/15/14 1604  NA 136  K 3.9  CL 105  CO2 25  GLUCOSE 86  BUN 14  CREATININE 0.79  CALCIUM 8.6   Liver Function Tests: No results for input(s): AST, ALT, ALKPHOS, BILITOT, PROT, ALBUMIN in the last 168 hours. No results for input(s): LIPASE, AMYLASE in  the last 168 hours. No results for input(s): AMMONIA in the last 168 hours. CBC:  Recent Labs Lab 12/15/14 1604  WBC 12.2*  NEUTROABS 7.8*  HGB 13.5  HCT 42.7  MCV 92.4  PLT 243   Cardiac Enzymes:  Recent Labs Lab 12/15/14 1604  TROPONINI <0.03    BNP (last 3 results)  Recent Labs  12/05/14 2056 12/15/14 1604  BNP 51.0 58.0    ProBNP (last 3 results)  Recent Labs  09/01/14 1722 09/04/14 2121 09/24/14 0704  PROBNP 653.8* 333.0* 798.5*    CBG: No results for input(s): GLUCAP in the last 168 hours.  Radiological Exams on Admission: Dg Chest 2 View  12/15/2014   CLINICAL DATA:  Shortness of breath  EXAM: CHEST  2 VIEW  COMPARISON:  12/05/2014  FINDINGS: Stable heart size and aortic contours post aortic valve replacement. Stable positioning of dual-chamber pacer leads from the left. There is chronic pulmonary hyperinflation and bronchial wall thickening. There is no edema, consolidation, effusion, or pneumothorax. Left nipple shadow is noted. Status post surgery to the right chest; remote right-sided rib fractures.  IMPRESSION: COPD without acute superimposed finding.   Electronically Signed   By: Marnee Spring M.D.   On: 12/15/2014 16:07      Assessment/Plan   1. COPD exacerbation. He will be treated with intravenous steroids, bronchodilators and oral antibiotics. His white count is slightly elevated but he does not have any pneumonia on chest x-ray.  Further recommendations will depend on patient's hospital  progress.  Code Status: Full code.  DVT Prophylaxis: Heparin.  Family Communication: I discussed the plan with the patient at the bedside.   Disposition Plan: Home when medically stable.   Time spent: 45 minutes.  [name] [name] [facility] Pager [phone].

## 2014-12-15 NOTE — ED Notes (Signed)
Pt reports was in hospital 2 weeks ago for COPD exacerbation.  Reports has been SOB since being discharged.   Pt says stopped driving to take a breathing treatment on the way here.

## 2014-12-15 NOTE — ED Notes (Signed)
Dr Gosrani at bedside. 

## 2014-12-16 DIAGNOSIS — N400 Enlarged prostate without lower urinary tract symptoms: Secondary | ICD-10-CM

## 2014-12-16 DIAGNOSIS — J9601 Acute respiratory failure with hypoxia: Secondary | ICD-10-CM

## 2014-12-16 DIAGNOSIS — J189 Pneumonia, unspecified organism: Secondary | ICD-10-CM

## 2014-12-16 LAB — COMPREHENSIVE METABOLIC PANEL
ALT: 20 U/L (ref 0–53)
AST: 19 U/L (ref 0–37)
Albumin: 3.4 g/dL — ABNORMAL LOW (ref 3.5–5.2)
Alkaline Phosphatase: 76 U/L (ref 39–117)
Anion gap: 8 (ref 5–15)
BUN: 20 mg/dL (ref 6–23)
CO2: 26 mmol/L (ref 19–32)
Calcium: 8.7 mg/dL (ref 8.4–10.5)
Chloride: 104 mmol/L (ref 96–112)
Creatinine, Ser: 0.72 mg/dL (ref 0.50–1.35)
GFR calc Af Amer: >90 mL/min (ref >90)
GFR calc non Af Amer: >90 mL/min (ref >90)
Glucose, Bld: 181 mg/dL — ABNORMAL HIGH (ref 70–99)
Potassium: 4.2 mmol/L (ref 3.5–5.1)
Sodium: 138 mmol/L (ref 135–145)
Total Bilirubin: 0.4 mg/dL (ref 0.3–1.2)
Total Protein: 6.6 g/dL (ref 6.0–8.3)

## 2014-12-16 LAB — CBC
HCT: 41.2 % (ref 39.0–52.0)
Hemoglobin: 13 g/dL (ref 13.0–17.0)
MCH: 29.3 pg (ref 26.0–34.0)
MCHC: 31.6 g/dL (ref 30.0–36.0)
MCV: 92.8 fL (ref 78.0–100.0)
Platelets: 253 10*3/uL (ref 150–400)
RBC: 4.44 MIL/uL (ref 4.22–5.81)
RDW: 15.2 % (ref 11.5–15.5)
WBC: 6.7 10*3/uL (ref 4.0–10.5)

## 2014-12-16 MED ORDER — IPRATROPIUM-ALBUTEROL 0.5-2.5 (3) MG/3ML IN SOLN
3.0000 mL | RESPIRATORY_TRACT | Status: DC
  Administered 2014-12-16 – 2014-12-17 (×6): 3 mL via RESPIRATORY_TRACT
  Filled 2014-12-16 (×6): qty 3

## 2014-12-16 MED ORDER — METHYLPREDNISOLONE SODIUM SUCC 125 MG IJ SOLR
60.0000 mg | Freq: Four times a day (QID) | INTRAMUSCULAR | Status: DC
  Administered 2014-12-16 – 2014-12-17 (×4): 60 mg via INTRAVENOUS
  Filled 2014-12-16 (×5): qty 2

## 2014-12-16 MED ORDER — IPRATROPIUM-ALBUTEROL 0.5-2.5 (3) MG/3ML IN SOLN
3.0000 mL | RESPIRATORY_TRACT | Status: DC | PRN
  Administered 2014-12-16 – 2014-12-17 (×2): 3 mL via RESPIRATORY_TRACT
  Filled 2014-12-16 (×3): qty 3

## 2014-12-16 NOTE — Progress Notes (Signed)
Walked into pt's room and pt was asleep sitting on the side of bed. Put pt back in bed with side rail up. Pt stated that he was going to put the side rails down when I walked out. Pt put side rails down and repositioned himself back on the side of the bed. Educated pt on fall prevention and safety. Pt refuses bed alarm and side rails. Pt also refused VTE prophylaxis. Will continue to monitor pt.

## 2014-12-16 NOTE — Progress Notes (Addendum)
Patient ID: [name] [name], male   DOB: 07/02/1946, 69 y.o.   MRN: [id number] [facility] PROGRESS NOTE  [name] [name] [id number] DOB: 05/03/1946 DOA: 12/15/2014 PCP: No PCP Per Patient  Brief narrative:    69 y.o. male with past medical history COPD, CAD, BPH who presented with worsening shortness of breath in past 24-48 hours prior to this admission. He was recently hospitalized for COPD exacerbation. On admission, patient was hemodynamically stable. He was given continuous nebulizer for 1 hour and felt slightly better but he is symptoms persisted and he was admitted for further management of COPD.  Assessment/Plan:    Principal problem: Acute respiratory failure with hypoxia / COPD exacerbation - We will change to nebulizer treatments to duoneb every 4 hours scheduled and then a free 2 hours as needed for shortness of breath or wheezing - Continue Solu-Medrol 60 mg IV every 6 hours - Continue oxygen support via nasal cannula to keep oxygen saturation above 90% - Continue empiric treatment for pneumonia  Active Problems: HCAP / Leukocytosis  - Patient recently hospitalized so at high risk for HCAP - Started on Levaquin daily  History of BPH - Continue Flomax daily  Coronary artery disease - Continue aspirin daily  Chronic systolic congestive heart failure - 2-D echo in December 2015 with ejection fraction of 40%. - Currently compensated, stable - Continue Lasix 40 mg daily    DVT Prophylaxis  - Heparin subcutaneous ordered.   Code Status: Full.  Family Communication:  plan of care discussed with the patient Disposition Plan: Patient is still wheezing. Not stable for discharge at this time.  IV access:  Peripheral IV  Procedures and diagnostic studies:    Dg Chest 2 View 12/15/2014    COPD without acute superimposed finding.     Medical Consultants:  None   Other Consultants:  None   IAnti-Infectives:   Levaquin 12/15/2014 -->   [name], [name],  MD  [facility] Pager [phone]  If 7PM-7AM, please contact night-coverage [email or web address] Password [email or web address] 12/16/2014, 8:49 AM   LOS: 1 day    HPI/Subjective: No acute overnight events.  Objective: Filed Vitals:   12/15/14 2232 12/15/14 2329 12/16/14 0515 12/16/14 0643  BP: 108/68   125/80  Pulse: 79   79  Temp: 98.5 F (36.9 C)   97.7 F (36.5 C)  TempSrc: Oral   Oral  Resp: 18   18  Height: 5\' 10"  (1.778 m)     Weight: 76.749 kg (169 lb 3.2 oz)     SpO2: 97% 97% 94% 95%   No intake or output data in the 24 hours ending 12/16/14 0849  Exam:   General:  Pt is alert, follows commands appropriately, not in acute distress  Cardiovascular: Regular rate and rhythm, S1/S2, no murmurs  Respiratory: wheezing appreciated in upper and mid lung lobes, no crackles, no rhonchi  Abdomen: Soft, non tender, non distended, bowel sounds present  Extremities: No edema, pulses DP and PT palpable bilaterally  Neuro: Grossly nonfocal  Data Reviewed: Basic Metabolic Panel:  Recent Labs Lab 12/15/14 1604 12/16/14 0540  NA 136 138  K 3.9 4.2  CL 105 104  CO2 25 26  GLUCOSE 86 181*  BUN 14 20  CREATININE 0.79 0.72  CALCIUM 8.6 8.7   Liver Function Tests:  Recent Labs Lab 12/16/14 0540  AST 19  ALT 20  ALKPHOS 76  BILITOT 0.4  PROT 6.6  ALBUMIN 3.4*   No results for input(s): LIPASE, AMYLASE in  the last 168 hours. No results for input(s): AMMONIA in the last 168 hours. CBC:  Recent Labs Lab 12/15/14 1604 12/16/14 0540  WBC 12.2* 6.7  NEUTROABS 7.8*  --   HGB 13.5 13.0  HCT 42.7 41.2  MCV 92.4 92.8  PLT 243 253   Cardiac Enzymes:  Recent Labs Lab 12/15/14 1604  TROPONINI <0.03   BNP: Invalid input(s): POCBNP CBG: No results for input(s): GLUCAP in the last 168 hours.  No results found for this or any previous visit (from the past 240 hour(s)).   Scheduled Meds: . sodium chloride   Intravenous STAT  . aspirin EC  81 mg Oral Daily  . folic  acid  1 mg Oral Daily  . furosemide  40 mg Oral Daily  . gabapentin  400 mg Oral TID  . heparin  5,000 Units Subcutaneous 3 times per day  . ipratropium-albuterol  3 mL Nebulization Q4H  . levofloxacin  500 mg Oral Q24H  . methylPREDNISolone (SOLU-MEDROL) injection  60 mg Intravenous Q6H  . tamsulosin  0.4 mg Oral QPC supper   Continuous Infusions:

## 2014-12-16 NOTE — Care Management Note (Signed)
    Page 1 of 1   12/16/2014     1:24:46 PM CARE MANAGEMENT NOTE 12/16/2014  Patient:  [name] [name],[name] [name]   Account Number:  [id number]  Date Initiated:  12/16/2014  Documentation initiated by:  Kathyrn SheriffHILDRESS,JESSICA  Subjective/Objective Assessment:   Pt admitted with COPD exacerbation. Pt is currently living hotel to hotel and [facility]. Pt says he has plenty of money to purchase his medications and pay to live in a hotel, it is what he wants to do.     Action/Plan:   Pt receieved his neb machine from his last admission. Pt has recieved his new debit card. Pt has seen Dr. Katrinka BlazingSmith in [place] in the past but [place] in [place]. 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   Anticipated DC Date:  12/18/2014   Anticipated DC Plan:  HOME/SELF CARE      DC Planning Services  CM consult      Choice offered to / List presented to:             Status of service:  Completed, signed off Medicare Important Message given?  YES (If response is "NO", the following Medicare IM given date fields will be blank) Date Medicare IM given:  12/16/2014 Medicare IM given by:  Kathyrn SheriffHILDRESS,JESSICA Date Additional Medicare IM given:   Additional Medicare IM given by:    Discharge Disposition:  HOME/SELF CARE  Per UR Regulation:    If discussed at Long Length of Stay Meetings, dates discussed:    Comments:  12/16/2014 1320 [address] EstelleJessica Childess, RN, MSN, CM Pt plans to discharge home with self care, possibly this weekend. Pt has been made a f/u appointmnet. Time, date and location written on paper and left in room along with being placed on AVS. No further CM needs identified at this time.

## 2014-12-17 DIAGNOSIS — I5022 Chronic systolic (congestive) heart failure: Secondary | ICD-10-CM | POA: Insufficient documentation

## 2014-12-17 DIAGNOSIS — D72829 Elevated white blood cell count, unspecified: Secondary | ICD-10-CM | POA: Insufficient documentation

## 2014-12-17 MED ORDER — IPRATROPIUM-ALBUTEROL 0.5-2.5 (3) MG/3ML IN SOLN: 3.0000 mL | Freq: Four times a day (QID) | RESPIRATORY_TRACT | Status: DC | PRN

## 2014-12-17 MED ORDER — ALBUTEROL SULFATE HFA 108 (90 BASE) MCG/ACT IN AERS: 2.0000 | INHALATION_SPRAY | RESPIRATORY_TRACT | Status: DC | PRN

## 2014-12-17 MED ORDER — IPRATROPIUM-ALBUTEROL 0.5-2.5 (3) MG/3ML IN SOLN: 3.0000 mL | RESPIRATORY_TRACT | Status: DC | PRN

## 2014-12-17 MED ORDER — LEVOFLOXACIN 500 MG PO TABS: 500.0000 mg | ORAL_TABLET | ORAL | Status: DC

## 2014-12-17 MED ORDER — PREDNISONE 10 MG PO TABS: ORAL_TABLET | ORAL | Status: DC

## 2014-12-17 NOTE — Discharge Instructions (Signed)

## 2014-12-17 NOTE — Progress Notes (Signed)
Patient is still complaining about taking hour long neb. He doesn't appear to need it , but he thinks he does.

## 2014-12-17 NOTE — Progress Notes (Addendum)
Did not awaken patient at midnight neb : nurse notified and will call when he awakens. Patient tends to abuse albuterol due to his mental / Psych. Problems. He is resting without problems.

## 2014-12-17 NOTE — Discharge Summary (Signed)
Physician Discharge Summary  [name] [name] ZOX:[id number] DOB: [birth year] DOA: 12/15/2014  PCP: No PCP Per Patient  Admit date: 12/15/2014 Discharge date: 12/17/2014  Recommendations for Outpatient Follow-up:  Take prednisone 50 mg a day, taper down by 10 mg a day down to 0 mg and then stop. Continue Levaquin for 4 more days on discharge.   Discharge Diagnoses:  Active Problems:   CAD (coronary artery disease)   COPD exacerbation   H/O aortic valve replacement   COPD with acute exacerbation    Discharge Condition: stable, looks good today and ambulating hallways with no problem, no shortness of breath, demanding to switch to different room for better view. Then later on reported he wanted to go home since he felt good.   Diet recommendation: as tolerated   History of present illness:   69 y.o. male with past medical history COPD, CAD, BPH who presented with worsening shortness of breath in past 24-48 hours prior to this admission. He was recently hospitalized for COPD exacerbation. On admission, patient was hemodynamically stable. He was given continuous nebulizer for 1 hour and felt slightly better but he is symptoms persisted and he was admitted for further management of COPD.  Assessment/Plan:    Principal problem: Acute respiratory failure with hypoxia / COPD exacerbation - prescription provided for nebulizer treatments per prior home regimen - respiratory status stable this am - prescription provided for tapering prednisone - prescription provided for Levaquin for 4 more days on discharge   Active Problems: HCAP / Leukocytosis  - Patient recently hospitalized so at high risk for HCAP and he was started on empiric treatment with Levaquin for possible HCAP - complete 4 more days of Levaquin on discharge   History of BPH - Continue Flomax daily on discharge   Coronary artery disease - Continue aspirin daily on discharge   Chronic systolic congestive heart  failure - 2-D echo in December 2015 with ejection fraction of 40%. - CHF compensated. CXR showed COPD, no other acute cardiopulmonary findings.  - Continue Lasix 40 mg daily    DVT Prophylaxis  - Heparin subcutaneous ordered while pt is in hospital    Code Status: Full.  Family Communication:  plan of care discussed with the patient   IV access:  Peripheral IV  Procedures and diagnostic studies:    Dg Chest 2 View 12/15/2014    COPD without acute superimposed finding.     Medical Consultants:  None   Other Consultants:  None   IAnti-Infectives:   Levaquin 12/15/2014 --> Signed:  Manson Passey, MD  [facility] 12/17/2014, 9:47 AM  Pager #: [phone]   Discharge Exam: Filed Vitals:   12/17/14 0519  BP: 131/62  Pulse: 80  Temp: 97.8 F (36.6 C)  Resp: 18   Filed Vitals:   12/17/14 0416 12/17/14 0519 12/17/14 0657 12/17/14 0927  BP:  131/62    Pulse:  80    Temp:  97.8 F (36.6 C)    TempSrc:  Oral    Resp:  18    Height:      Weight:      SpO2: 95% 95% 95% 94%    General: Pt is alert, follows commands appropriately, not in acute distress Cardiovascular: Regular rate and rhythm, S1/S2 +, no murmurs Respiratory: Clear to auscultation bilaterally, no wheezing, no crackles, no rhonchi Abdominal: Soft, non tender, non distended, bowel sounds +, no guarding Extremities: no edema, no cyanosis, pulses palpable bilaterally DP and PT Neuro: Grossly nonfocal  Discharge Instructions  Discharge Instructions    Call MD for:  persistant nausea and vomiting    Complete by:  As directed      Call MD for:  severe uncontrolled pain    Complete by:  As directed      Diet - low sodium heart healthy    Complete by:  As directed      Discharge instructions    Complete by:  As directed   Take prednisone 50 mg a day, taper down by 10 mg a day down to 0 mg and then stop. Continue Levaquin for 4 more days on discharge.     Increase activity slowly    Complete  by:  As directed             Medication List    STOP taking these medications        benzonatate 100 MG capsule  Commonly known as:  TESSALON     divalproex 500 MG 24 hr tablet  Commonly known as:  DEPAKOTE ER     guaiFENesin 600 MG 12 hr tablet  Commonly known as:  MUCINEX     hydrOXYzine 50 MG tablet  Commonly known as:  ATARAX/VISTARIL     thiamine 100 MG tablet     traZODone 50 MG tablet  Commonly known as:  DESYREL      TAKE these medications        albuterol 108 (90 BASE) MCG/ACT inhaler  Commonly known as:  PROVENTIL HFA;VENTOLIN HFA  Inhale 2 puffs into the lungs every 4 (four) hours as needed for wheezing or shortness of breath.     aspirin EC 81 MG tablet  Take 81 mg by mouth daily.     atorvastatin 40 MG tablet  Commonly known as:  LIPITOR  Take 1 tablet (40 mg total) by mouth daily at 6 PM.     folic acid 1 MG tablet  Commonly known as:  FOLVITE  Take 1 tablet (1 mg total) by mouth daily.     furosemide 40 MG tablet  Commonly known as:  LASIX  Take 1 tablet (40 mg total) by mouth daily.     gabapentin 400 MG capsule  Commonly known as:  NEURONTIN  Take 1 capsule (400 mg total) by mouth 3 (three) times daily.     ipratropium-albuterol 0.5-2.5 (3) MG/3ML Soln  Commonly known as:  DUONEB  Take 3 mLs by nebulization every 4 (four) hours as needed. Shortness of breath/wheezing     levofloxacin 500 MG tablet  Commonly known as:  LEVAQUIN  Take 1 tablet (500 mg total) by mouth daily.     predniSONE 10 MG tablet  Commonly known as:  DELTASONE  Take prednisone 50 mg daily, taper down by 10 mg a day down to 0 mg and then stop.     tamsulosin 0.4 MG Caps capsule  Commonly known as:  FLOMAX  Take 1 capsule (0.4 mg total) by mouth daily after supper.           Follow-up Information    Follow up with Isabella Stalling, MD On 02/06/2015.   Specialty:  Internal Medicine   Why:  9am   Contact information:   [address] [place]  [address] [phone]        The results of significant diagnostics from this hospitalization (including imaging, microbiology, ancillary and laboratory) are listed below for reference.    Significant Diagnostic Studies: Dg Chest 2 View  12/15/2014  CLINICAL DATA:  Shortness of breath  EXAM: CHEST  2 VIEW  COMPARISON:  12/05/2014  FINDINGS: Stable heart size and aortic contours post aortic valve replacement. Stable positioning of dual-chamber pacer leads from the left. There is chronic pulmonary hyperinflation and bronchial wall thickening. There is no edema, consolidation, effusion, or pneumothorax. Left nipple shadow is noted. Status post surgery to the right chest; remote right-sided rib fractures.  IMPRESSION: COPD without acute superimposed finding.   Electronically Signed   By: Marnee SpringJonathon  Watts M.D.   On: 12/15/2014 16:07   Dg Chest 2 View  12/05/2014   CLINICAL DATA:  Shortness of breath for 2-3 days.  COPD.  EXAM: CHEST  2 VIEW  COMPARISON:  Single view of the chest 09/01/2014.  FINDINGS: Surgical clips projecting over the right upper chest are again seen. The patient is status post aortic valve replacement with a pacing device in place. The chest is hyperexpanded with attenuation of the pulmonary vasculature but the lungs are clear. Heart size is normal. No pneumothorax or pleural effusion. Remote healed right rib fractures are noted.  IMPRESSION: Emphysema postoperative change without acute disease.   Electronically Signed   By: Drusilla Kannerhomas  Dalessio M.D.   On: 12/05/2014 13:20    Microbiology: No results found for this or any previous visit (from the past 240 hour(s)).   Labs: Basic Metabolic Panel:  Recent Labs Lab 12/15/14 1604 12/16/14 0540  NA 136 138  K 3.9 4.2  CL 105 104  CO2 25 26  GLUCOSE 86 181*  BUN 14 20  CREATININE 0.79 0.72  CALCIUM 8.6 8.7   Liver Function Tests:  Recent Labs Lab 12/16/14 0540  AST 19  ALT 20  ALKPHOS 76  BILITOT 0.4  PROT 6.6  ALBUMIN  3.4*   No results for input(s): LIPASE, AMYLASE in the last 168 hours. No results for input(s): AMMONIA in the last 168 hours. CBC:  Recent Labs Lab 12/15/14 1604 12/16/14 0540  WBC 12.2* 6.7  NEUTROABS 7.8*  --   HGB 13.5 13.0  HCT 42.7 41.2  MCV 92.4 92.8  PLT 243 253   Cardiac Enzymes:  Recent Labs Lab 12/15/14 1604  TROPONINI <0.03   BNP: BNP (last 3 results)  Recent Labs  12/05/14 2056 12/15/14 1604  BNP 51.0 58.0    ProBNP (last 3 results)  Recent Labs  09/01/14 1722 09/04/14 2121 09/24/14 0704  PROBNP 653.8* 333.0* 798.5*    CBG: No results for input(s): GLUCAP in the last 168 hours.  Time coordinating discharge: Over 30 minutes

## 2014-12-17 NOTE — Progress Notes (Signed)
Patient d/c home d/c  Rx's and instructions reviewed with patient verbalized understanding No c/o pain at d/c  Nursing supervisor called to room patient states he needs help with breathing treatments  [name] [name], Kae HellerMiranda Lynn

## 2014-12-28 ENCOUNTER — Encounter (HOSPITAL_COMMUNITY): Payer: Self-pay | Admitting: Emergency Medicine

## 2014-12-28 ENCOUNTER — Emergency Department (HOSPITAL_COMMUNITY): Payer: Medicare HMO

## 2014-12-28 ENCOUNTER — Inpatient Hospital Stay (HOSPITAL_COMMUNITY)
Admission: EM | Admit: 2014-12-28 | Discharge: 2014-12-31 | DRG: 191 | Disposition: A | Discharge status: Home / Self Care | Payer: Medicare HMO | Attending: Internal Medicine | Admitting: Internal Medicine

## 2014-12-28 DIAGNOSIS — Z9119 Patient's noncompliance with other medical treatment and regimen: Secondary | ICD-10-CM | POA: Diagnosis present

## 2014-12-28 DIAGNOSIS — I2510 Atherosclerotic heart disease of native coronary artery without angina pectoris: Secondary | ICD-10-CM | POA: Diagnosis present

## 2014-12-28 DIAGNOSIS — Z8249 Family history of ischemic heart disease and other diseases of the circulatory system: Secondary | ICD-10-CM

## 2014-12-28 DIAGNOSIS — F17210 Nicotine dependence, cigarettes, uncomplicated: Secondary | ICD-10-CM | POA: Diagnosis present

## 2014-12-28 DIAGNOSIS — Z809 Family history of malignant neoplasm, unspecified: Secondary | ICD-10-CM | POA: Diagnosis not present

## 2014-12-28 DIAGNOSIS — T380X5A Adverse effect of glucocorticoids and synthetic analogues, initial encounter: Secondary | ICD-10-CM | POA: Diagnosis present

## 2014-12-28 DIAGNOSIS — R0602 Shortness of breath: Secondary | ICD-10-CM

## 2014-12-28 DIAGNOSIS — Z953 Presence of xenogenic heart valve: Secondary | ICD-10-CM

## 2014-12-28 DIAGNOSIS — Z590 Homelessness: Secondary | ICD-10-CM

## 2014-12-28 DIAGNOSIS — Z952 Presence of prosthetic heart valve: Secondary | ICD-10-CM | POA: Diagnosis not present

## 2014-12-28 DIAGNOSIS — J441 Chronic obstructive pulmonary disease with (acute) exacerbation: Secondary | ICD-10-CM | POA: Diagnosis present

## 2014-12-28 DIAGNOSIS — M792 Neuralgia and neuritis, unspecified: Secondary | ICD-10-CM | POA: Diagnosis present

## 2014-12-28 DIAGNOSIS — N400 Enlarged prostate without lower urinary tract symptoms: Secondary | ICD-10-CM | POA: Diagnosis present

## 2014-12-28 DIAGNOSIS — Z950 Presence of cardiac pacemaker: Secondary | ICD-10-CM | POA: Diagnosis not present

## 2014-12-28 DIAGNOSIS — Z720 Tobacco use: Secondary | ICD-10-CM | POA: Diagnosis present

## 2014-12-28 DIAGNOSIS — I5042 Chronic combined systolic (congestive) and diastolic (congestive) heart failure: Secondary | ICD-10-CM | POA: Diagnosis present

## 2014-12-28 DIAGNOSIS — Z833 Family history of diabetes mellitus: Secondary | ICD-10-CM | POA: Diagnosis not present

## 2014-12-28 DIAGNOSIS — F319 Bipolar disorder, unspecified: Secondary | ICD-10-CM | POA: Diagnosis present

## 2014-12-28 DIAGNOSIS — I350 Nonrheumatic aortic (valve) stenosis: Secondary | ICD-10-CM | POA: Diagnosis present

## 2014-12-28 DIAGNOSIS — F419 Anxiety disorder, unspecified: Secondary | ICD-10-CM | POA: Diagnosis present

## 2014-12-28 DIAGNOSIS — R739 Hyperglycemia, unspecified: Secondary | ICD-10-CM | POA: Diagnosis present

## 2014-12-28 LAB — BASIC METABOLIC PANEL
Anion gap: 7 (ref 5–15)
BUN: 12 mg/dL (ref 6–23)
CO2: 29 mmol/L (ref 19–32)
Calcium: 9 mg/dL (ref 8.4–10.5)
Chloride: 107 mmol/L (ref 96–112)
Creatinine, Ser: 0.83 mg/dL (ref 0.50–1.35)
GFR calc Af Amer: >90 mL/min (ref >90)
GFR calc non Af Amer: 88 mL/min — ABNORMAL LOW (ref >90)
Glucose, Bld: 112 mg/dL — ABNORMAL HIGH (ref 70–99)
Potassium: 3.7 mmol/L (ref 3.5–5.1)
Sodium: 143 mmol/L (ref 135–145)

## 2014-12-28 LAB — TROPONIN I: Troponin I: <0.03 ng/mL (ref <0.031)

## 2014-12-28 LAB — CBC WITH DIFFERENTIAL/PLATELET
Basophils Absolute: 0 10*3/uL (ref 0.0–0.1)
Basophils Relative: 0 % (ref 0–1)
Eosinophils Absolute: 0.5 10*3/uL (ref 0.0–0.7)
Eosinophils Relative: 4 % (ref 0–5)
HCT: 41.5 % (ref 39.0–52.0)
Hemoglobin: 13.6 g/dL (ref 13.0–17.0)
Lymphocytes Relative: 14 % (ref 12–46)
Lymphs Abs: 1.7 10*3/uL (ref 0.7–4.0)
MCH: 30.9 pg (ref 26.0–34.0)
MCHC: 32.8 g/dL (ref 30.0–36.0)
MCV: 94.3 fL (ref 78.0–100.0)
Monocytes Absolute: 1.3 10*3/uL — ABNORMAL HIGH (ref 0.1–1.0)
Monocytes Relative: 10 % (ref 3–12)
Neutro Abs: 8.9 10*3/uL — ABNORMAL HIGH (ref 1.7–7.7)
Neutrophils Relative %: 72 % (ref 43–77)
Platelets: 218 10*3/uL (ref 150–400)
RBC: 4.4 MIL/uL (ref 4.22–5.81)
RDW: 16.4 % — ABNORMAL HIGH (ref 11.5–15.5)
WBC: 12.4 10*3/uL — ABNORMAL HIGH (ref 4.0–10.5)

## 2014-12-28 LAB — BRAIN NATRIURETIC PEPTIDE: B Natriuretic Peptide: 131 pg/mL — ABNORMAL HIGH (ref 0.0–100.0)

## 2014-12-28 MED ORDER — AZITHROMYCIN 250 MG PO TABS
250.0000 mg | ORAL_TABLET | Freq: Every day | ORAL | Status: DC
  Administered 2014-12-30 – 2014-12-31 (×2): 250 mg via ORAL
  Filled 2014-12-28 (×2): qty 1

## 2014-12-28 MED ORDER — METHYLPREDNISOLONE SODIUM SUCC 125 MG IJ SOLR
125.0000 mg | Freq: Once | INTRAMUSCULAR | Status: AC
  Administered 2014-12-28: 125 mg via INTRAVENOUS
  Filled 2014-12-28: qty 2

## 2014-12-28 MED ORDER — ALBUTEROL (5 MG/ML) CONTINUOUS INHALATION SOLN
10.0000 mg/h | INHALATION_SOLUTION | Freq: Once | RESPIRATORY_TRACT | Status: AC
  Administered 2014-12-28: 10 mg/h via RESPIRATORY_TRACT
  Filled 2014-12-28 (×2): qty 20

## 2014-12-28 MED ORDER — PREDNISONE 20 MG PO TABS: 20.0000 mg | ORAL_TABLET | Freq: Every day | ORAL | Status: DC

## 2014-12-28 MED ORDER — ALBUTEROL (5 MG/ML) CONTINUOUS INHALATION SOLN
10.0000 mg/h | INHALATION_SOLUTION | Freq: Once | RESPIRATORY_TRACT | Status: AC
  Administered 2014-12-28: 10 mg/h via RESPIRATORY_TRACT
  Filled 2014-12-28: qty 20

## 2014-12-28 MED ORDER — ALBUTEROL (5 MG/ML) CONTINUOUS INHALATION SOLN: 10.0000 mg/h | INHALATION_SOLUTION | Freq: Once | RESPIRATORY_TRACT | Status: AC

## 2014-12-28 MED ORDER — AZITHROMYCIN 250 MG PO TABS
500.0000 mg | ORAL_TABLET | Freq: Every day | ORAL | Status: AC
  Administered 2014-12-29: 500 mg via ORAL
  Filled 2014-12-28: qty 2

## 2014-12-28 MED ORDER — ALBUTEROL SULFATE HFA 108 (90 BASE) MCG/ACT IN AERS
2.0000 | INHALATION_SPRAY | RESPIRATORY_TRACT | Status: DC
  Administered 2014-12-28: 2 via RESPIRATORY_TRACT
  Filled 2014-12-28: qty 6.7

## 2014-12-28 NOTE — ED Notes (Signed)
Pt. Reporting that he is still feeling short of breath. Pt. States that he does not feel well enough to be discharged. EDP notified.

## 2014-12-28 NOTE — Discharge Instructions (Signed)

## 2014-12-28 NOTE — ED Notes (Signed)
Pt. Given meal 

## 2014-12-28 NOTE — ED Notes (Signed)
RT paged for breathing treatment

## 2014-12-28 NOTE — ED Notes (Signed)
RT called for neb

## 2014-12-28 NOTE — ED Provider Notes (Addendum)
CSN: [id number]     Arrival date & time 12/28/14  1935 History  This chart was scribed for Rolland PorterMark Adiyah Lame, MD by Bronson CurbJacqueline Melvin, ED Scribe. This patient was seen in room APA01/APA01 and the patient's care was started at 8:00 PM.     Chief Complaint  Patient presents with  . Shortness of Breath    The history is provided by the patient. No language interpreter was used.     HPI Comments: [name] [name] is a 69 y.o. male, with history of pneumothorax, CHF, and COPD, who presents to the Emergency Department complaining of SOB that has gotten worse today. Patient is speaking in short sentences and reports associated chest tightness. He states he is currently out of his albuterol inhaler, with his last dose being approximately 10 hours ago. Patient was seen here and admitted approximately 2 weeks ago for the same. He checked out AMA after less than 24 hours. He states his reason was that he "had to go to the [place] because aspirin $800 on tickets".  States while he was short of breath and "collapsed" in the inf[place] had "a shot" of some steroids. He remained an additional 2 days of the race.   He denies chest pain. Patient tinged to smoke despite his advanced COPD.   Past Medical History  Diagnosis Date  . Severe aortic valve stenosis     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  . COPD (chronic obstructive pulmonary disease)   . Thoracic ascending aortic aneurysm     [facility]  . COPD (chronic obstructive pulmonary disease)   . BPH (benign prostatic hyperplasia)   . Pacemaker     [facility]  . SSS (sick sinus syndrome)     s/p PPM   . CAD (coronary artery disease)     ?MI in 2007  . Pneumothorax     traumatic 2007  . Anxiety and depression   . Psychosis   . Bipolar disorder   . CHF (congestive heart failure)   . Neuropathic pain   . Homelessness   . Tobacco abuse    Past Surgical History  Procedure Laterality Date  . Pacemaker  placement  2007    battery replaced 07/04/2014  . Hiatal hernia repair    . Knee surgery      L5-6  . Lumbar fusion    . Aortic valve replacement      porcine valve  . Cardiac surgery      ascending thoracic aneurysm repair 06/2014   Family History  Problem Relation Age of Onset  . CAD Father   . Diabetes Father   . High blood pressure Brother   . Cancer Brother    History  Substance Use Topics  . Smoking status: Current Some Day Smoker -- 1.00 packs/day    Types: Cigarettes  . Smokeless tobacco: Not on file  . Alcohol Use: Yes     Comment: occ    Review of Systems  Constitutional: Negative for fever, chills, diaphoresis, appetite change and fatigue.  HENT: Negative for mouth sores, sore throat and trouble swallowing.   Eyes: Negative for visual disturbance.  Respiratory: Positive for chest tightness and shortness of breath. Negative for cough and wheezing.   Cardiovascular: Negative for chest pain.  Gastrointestinal: Negative for nausea, vomiting, abdominal pain, diarrhea and abdominal distention.  Endocrine: Negative for polydipsia, polyphagia and polyuria.  Genitourinary: Negative for dysuria, frequency and hematuria.  Musculoskeletal: Negative for  gait problem.  Skin: Negative for color change, pallor and rash.  Neurological: Negative for dizziness, syncope, light-headedness and headaches.  Hematological: Does not bruise/bleed easily.  Psychiatric/Behavioral: Negative for behavioral problems and confusion.      Allergies  Tiotropium bromide monohydrate and Morphine and related  Home Medications   Prior to Admission medications   Medication Sig Start Date End Date Taking? Authorizing Provider  albuterol (PROVENTIL HFA;VENTOLIN HFA) 108 (90 BASE) MCG/ACT inhaler Inhale 2 puffs into the lungs every 4 (four) hours as needed for wheezing or shortness of breath. 12/17/14  Yes Alison Murray, MD  LEVITRA 10 MG tablet Take 10 mg by mouth as needed for erectile dysfunction.   12/15/14  Yes Historical Provider, MD  tamsulosin (FLOMAX) 0.4 MG CAPS capsule Take 1 capsule (0.4 mg total) by mouth daily after supper. 12/08/14  Yes Erick Blinks, MD  aspirin EC 81 MG tablet Take 81 mg by mouth daily.    Historical Provider, MD  folic acid (FOLVITE) 1 MG tablet Take 1 tablet (1 mg total) by mouth daily. 10/04/14   Richarda Overlie, MD  furosemide (LASIX) 40 MG tablet Take 1 tablet (40 mg total) by mouth daily. 09/12/14   Velna Hatchet May Agustin, NP  gabapentin (NEURONTIN) 400 MG capsule Take 1 capsule (400 mg total) by mouth 3 (three) times daily. 10/04/14   Richarda Overlie, MD  ipratropium-albuterol (DUONEB) 0.5-2.5 (3) MG/3ML SOLN Take 3 mLs by nebulization every 6 (six) hours as needed. 12/17/14   Alison Murray, MD  levofloxacin (LEVAQUIN) 500 MG tablet Take 1 tablet (500 mg total) by mouth daily. 12/17/14   Alison Murray, MD  predniSONE (DELTASONE) 20 MG tablet Take 1 tablet (20 mg total) by mouth daily with breakfast. 12/28/14   Rolland Porter, MD   Triage Vitals: BP 135/71 mmHg  Pulse 95  Temp(Src) 98 F (36.7 C) (Oral)  Resp 24  Ht 5' 10.5" (1.791 m)  Wt 172 lb (78.019 kg)  BMI 24.32 kg/m2  SpO2 96%  Physical Exam  Constitutional: He is oriented to person, place, and time. He appears well-developed and well-nourished. No distress.  HENT:  Head: Normocephalic.  Eyes: Conjunctivae are normal. Pupils are equal, round, and reactive to light. No scleral icterus.  Neck: Normal range of motion. Neck supple. No thyromegaly present.  Cardiovascular: Normal rate and regular rhythm.  Exam reveals no gallop and no friction rub.   No murmur heard. Pulmonary/Chest: Effort normal and breath sounds normal. No respiratory distress. He has no wheezes. He has no rales.  Prolonged and diminished breath sounds. Bent over tripod position 3-4 word dyspnea.  Abdominal: Soft. Bowel sounds are normal. He exhibits no distension. There is no tenderness. There is no rebound.  Musculoskeletal: Normal range of  motion.  Neurological: He is alert and oriented to person, place, and time.  Skin: Skin is warm and dry. No rash noted.  Psychiatric: He has a normal mood and affect. His behavior is normal.    ED Course  Procedures (including critical care time)  DIAGNOSTIC STUDIES: Oxygen Saturation is 96% on room air, adequate by my interpretation.    COORDINATION OF CARE: At 2004 Discussed treatment plan with patient which includes breathing treatment. Patient agrees.   Labs Review Labs Reviewed  BASIC METABOLIC PANEL - Abnormal; Notable for the following:    Glucose, Bld 112 (*)    GFR calc non Af Amer 88 (*)    All other components within normal limits  CBC WITH DIFFERENTIAL/PLATELET -  Abnormal; Notable for the following:    WBC 12.4 (*)    RDW 16.4 (*)    Neutro Abs 8.9 (*)    Monocytes Absolute 1.3 (*)    All other components within normal limits  BRAIN NATRIURETIC PEPTIDE - Abnormal; Notable for the following:    B Natriuretic Peptide 131.0 (*)    All other components within normal limits  TROPONIN I    Imaging Review Dg Chest Port 1 View  12/28/2014   CLINICAL DATA:  Acute onset of shortness of breath and chest tightness. Initial encounter.  EXAM: PORTABLE CHEST - 1 VIEW  COMPARISON:  Chest radiograph performed 12/15/2014  FINDINGS: The lungs are well-aerated. Mild vascular congestion is noted. Minimal bibasilar atelectasis is seen. There is no evidence of pleural effusion or pneumothorax.  The cardiomediastinal silhouette is borderline normal in size. The patient is status post median sternotomy. A pacemaker is noted overlying the left chest wall, with leads ending overlying the right atrium and right ventricle. No acute osseous abnormalities are seen. Clips are seen overlying the right lung apex.  IMPRESSION: Mild vascular congestion noted. Minimal bibasilar atelectasis seen. Lungs otherwise grossly clear.   Electronically Signed   By: Roanna Raider M.D.   On: 12/28/2014 20:34      EKG Interpretation   Date/Time:  Wednesday December 28 2014 20:18:05 EST Ventricular Rate:  84 PR Interval:  184 QRS Duration: 156 QT Interval:  431 QTC Calculation: 509 R Axis:   -81 Text Interpretation:  Sinus rhythm IVCD Confirmed by Fayrene Fearing  MD, Canisha Issac  [phone]) on 12/28/2014 8:22:33 PM       CRITICAL CARE Performed by: Rolland Porter JOSEPH   Total critical care time: 60 min  Start:  20:15 Stop: 21:15 Critical care time was exclusive of separately billable procedures and treating other patients.  Critical care was necessary to treat or prevent imminent or life-threatening deterioration.  Critical care was time spent personally by me on the following activities: development of treatment plan with patient and/or surrogate as well as nursing, discussions with consultants, evaluation of patient's response to treatment, examination of patient, obtaining history from patient or surrogate, ordering and performing treatments and interventions, ordering and review of laboratory studies, ordering and review of radiographic studies, pulse oximetry and re-evaluation of patient's condition. Care  MDM   Final diagnoses:  SOB (shortness of breath)  COPD exacerbation   Patient had x-ray suggests possible interstitial fluid. On exam before and after first and second treatment he has no crackles. He has no dependent edema. BMP minimal. Slight improvement.   On recheck after first neb patient had improvement in aeration but persistent end expiratory wheezing. Given second 1 hour neb. He states he feels some improvement. However he ambulates only from one room to the next. He has recurrent bronchospasm dyspnea returns to tripod position. I think he will require admission.   I personally performed the services described in this documentation, which was scribed in my presence. The recorded information has been reviewed and is accurate.    Rolland Porter, MD 12/28/14 2240  Rolland Porter, MD 12/28/14  2241  Rolland Porter, MD 12/28/14 2257  Rolland Porter, MD 12/28/14 2306

## 2014-12-28 NOTE — ED Notes (Signed)
Pt. Resting in bed. Updated pt. No distress noted.

## 2014-12-28 NOTE — ED Notes (Signed)
Pt was admitting 2  week for COPD, [facility], pt has been using neb treatment, used last treatment today, pt has been getting worse today

## 2014-12-29 ENCOUNTER — Encounter (HOSPITAL_COMMUNITY): Payer: Self-pay | Admitting: *Deleted

## 2014-12-29 DIAGNOSIS — R0602 Shortness of breath: Secondary | ICD-10-CM

## 2014-12-29 DIAGNOSIS — I2510 Atherosclerotic heart disease of native coronary artery without angina pectoris: Secondary | ICD-10-CM

## 2014-12-29 DIAGNOSIS — J441 Chronic obstructive pulmonary disease with (acute) exacerbation: Principal | ICD-10-CM

## 2014-12-29 DIAGNOSIS — I5042 Chronic combined systolic (congestive) and diastolic (congestive) heart failure: Secondary | ICD-10-CM

## 2014-12-29 DIAGNOSIS — F319 Bipolar disorder, unspecified: Secondary | ICD-10-CM

## 2014-12-29 DIAGNOSIS — Z954 Presence of other heart-valve replacement: Secondary | ICD-10-CM

## 2014-12-29 LAB — D-DIMER, QUANTITATIVE: D-Dimer, Quant: 0.64 ug/mL-FEU — ABNORMAL HIGH (ref 0.00–0.48)

## 2014-12-29 LAB — GLUCOSE, CAPILLARY
Glucose-Capillary: 111 mg/dL — ABNORMAL HIGH (ref 70–99)
Glucose-Capillary: 155 mg/dL — ABNORMAL HIGH (ref 70–99)

## 2014-12-29 MED ORDER — IPRATROPIUM BROMIDE 0.02 % IN SOLN: 0.5000 mg | Freq: Four times a day (QID) | RESPIRATORY_TRACT | Status: DC

## 2014-12-29 MED ORDER — INSULIN ASPART 100 UNIT/ML ~~LOC~~ SOLN: 0.0000 [IU] | Freq: Every day | SUBCUTANEOUS | Status: DC

## 2014-12-29 MED ORDER — SALINE SPRAY 0.65 % NA SOLN
1.0000 | NASAL | Status: DC | PRN
  Administered 2014-12-29: 1 via NASAL
  Filled 2014-12-29: qty 44

## 2014-12-29 MED ORDER — IPRATROPIUM-ALBUTEROL 0.5-2.5 (3) MG/3ML IN SOLN
3.0000 mL | Freq: Four times a day (QID) | RESPIRATORY_TRACT | Status: DC
  Administered 2014-12-29 – 2014-12-31 (×9): 3 mL via RESPIRATORY_TRACT
  Filled 2014-12-29 (×9): qty 3

## 2014-12-29 MED ORDER — ALBUTEROL SULFATE (2.5 MG/3ML) 0.083% IN NEBU: 2.5000 mg | INHALATION_SOLUTION | RESPIRATORY_TRACT | Status: DC

## 2014-12-29 MED ORDER — INSULIN ASPART 100 UNIT/ML ~~LOC~~ SOLN: 0.0000 [IU] | Freq: Three times a day (TID) | SUBCUTANEOUS | Status: DC

## 2014-12-29 MED ORDER — TAMSULOSIN HCL 0.4 MG PO CAPS
0.4000 mg | ORAL_CAPSULE | Freq: Every day | ORAL | Status: DC
  Administered 2014-12-29 – 2014-12-30 (×2): 0.4 mg via ORAL
  Filled 2014-12-29 (×3): qty 1

## 2014-12-29 MED ORDER — ENOXAPARIN SODIUM 40 MG/0.4ML ~~LOC~~ SOLN
40.0000 mg | SUBCUTANEOUS | Status: DC
  Filled 2014-12-29 (×2): qty 0.4

## 2014-12-29 MED ORDER — GABAPENTIN 400 MG PO CAPS
400.0000 mg | ORAL_CAPSULE | Freq: Three times a day (TID) | ORAL | Status: DC
  Administered 2014-12-29 – 2014-12-31 (×7): 400 mg via ORAL
  Filled 2014-12-29 (×7): qty 1

## 2014-12-29 MED ORDER — SODIUM CHLORIDE 0.9 % IV SOLN: 250.0000 mL | INTRAVENOUS | Status: DC | PRN

## 2014-12-29 MED ORDER — FUROSEMIDE 10 MG/ML IJ SOLN
20.0000 mg | Freq: Once | INTRAMUSCULAR | Status: AC
  Administered 2014-12-29: 20 mg via INTRAVENOUS
  Filled 2014-12-29: qty 2

## 2014-12-29 MED ORDER — ALBUTEROL SULFATE (2.5 MG/3ML) 0.083% IN NEBU: 2.5000 mg | INHALATION_SOLUTION | Freq: Four times a day (QID) | RESPIRATORY_TRACT | Status: DC

## 2014-12-29 MED ORDER — METHYLPREDNISOLONE SODIUM SUCC 125 MG IJ SOLR
80.0000 mg | Freq: Two times a day (BID) | INTRAMUSCULAR | Status: DC
  Administered 2014-12-29 – 2014-12-31 (×5): 80 mg via INTRAVENOUS
  Filled 2014-12-29 (×5): qty 2

## 2014-12-29 MED ORDER — GUAIFENESIN ER 600 MG PO TB12
600.0000 mg | ORAL_TABLET | Freq: Two times a day (BID) | ORAL | Status: DC
  Administered 2014-12-29 – 2014-12-31 (×6): 600 mg via ORAL
  Filled 2014-12-29 (×6): qty 1

## 2014-12-29 MED ORDER — SODIUM CHLORIDE 0.9 % IJ SOLN
3.0000 mL | Freq: Two times a day (BID) | INTRAMUSCULAR | Status: DC
  Administered 2014-12-29 – 2014-12-31 (×6): 3 mL via INTRAVENOUS

## 2014-12-29 MED ORDER — FUROSEMIDE 40 MG PO TABS
40.0000 mg | ORAL_TABLET | Freq: Every day | ORAL | Status: DC
  Administered 2014-12-29 – 2014-12-31 (×3): 40 mg via ORAL
  Filled 2014-12-29 (×3): qty 1

## 2014-12-29 MED ORDER — SODIUM CHLORIDE 0.9 % IJ SOLN
3.0000 mL | Freq: Two times a day (BID) | INTRAMUSCULAR | Status: DC
  Administered 2014-12-31: 3 mL via INTRAVENOUS

## 2014-12-29 MED ORDER — ALBUTEROL SULFATE (2.5 MG/3ML) 0.083% IN NEBU
2.5000 mg | INHALATION_SOLUTION | RESPIRATORY_TRACT | Status: DC | PRN
  Administered 2014-12-29 – 2014-12-30 (×3): 2.5 mg via RESPIRATORY_TRACT
  Filled 2014-12-29 (×3): qty 3

## 2014-12-29 MED ORDER — ASPIRIN EC 81 MG PO TBEC
81.0000 mg | DELAYED_RELEASE_TABLET | Freq: Every day | ORAL | Status: DC
  Administered 2014-12-29 – 2014-12-31 (×3): 81 mg via ORAL
  Filled 2014-12-29 (×3): qty 1

## 2014-12-29 MED ORDER — SODIUM CHLORIDE 0.9 % IJ SOLN: 3.0000 mL | INTRAMUSCULAR | Status: DC | PRN

## 2014-12-29 MED ORDER — POTASSIUM CHLORIDE CRYS ER 20 MEQ PO TBCR
20.0000 meq | EXTENDED_RELEASE_TABLET | Freq: Two times a day (BID) | ORAL | Status: AC
  Administered 2014-12-29 – 2014-12-30 (×4): 20 meq via ORAL
  Filled 2014-12-29 (×4): qty 1

## 2014-12-29 MED ORDER — FOLIC ACID 1 MG PO TABS
1.0000 mg | ORAL_TABLET | Freq: Every day | ORAL | Status: DC
  Administered 2014-12-29 – 2014-12-31 (×3): 1 mg via ORAL
  Filled 2014-12-29 (×3): qty 1

## 2014-12-29 MED ORDER — FAMOTIDINE 20 MG PO TABS
20.0000 mg | ORAL_TABLET | Freq: Every day | ORAL | Status: DC
  Administered 2014-12-29 – 2014-12-31 (×3): 20 mg via ORAL
  Filled 2014-12-29 (×3): qty 1

## 2014-12-29 NOTE — Clinical Social Work Psychosocial (Signed)
Clinical Social Work Department BRIEF PSYCHOSOCIAL ASSESSMENT 12/29/2014  Patient:  [name] [name], [name] [name]     Account Number:  [id number]     Admit date:  12/28/2014  Clinical Social Worker:  Legrand Como  Date/Time:  12/29/2014 10:47 AM  Referred by:  CSW  Date Referred:  12/29/2014 Referred for  Other - See comment   Other Referral:   COPD Gold   Interview type:  Patient Other interview type:    PSYCHOSOCIAL DATA Living Status:   Admitted from facility:   Level of care:   Primary support name:  Patient reports no supports Primary support relationship to patient:   Degree of support available:   Patient reports that he has no supports.    CURRENT CONCERNS Current Concerns  Other - See comment   Other Concerns:   COPD Gold    SOCIAL WORK ASSESSMENT / PLAN CSW met with patient who was alert and oriented.  Patient indicated that he replaced his debit card, however now he has lost an $800 money clip. Patient indicated that he was doing well and that two weeks ago he went to the [place] [employer]. He also indicated that he reconnected with his son that he had been estranged from for the past three years. Patient indicated that he continues to live in his truck or in hotels.  He indicated that he is planning on moving in to an apartment attached to a house near the [facility].  He indicated that the rent will be $400/month.  Patient indicated that he can afford the rent as he receives $3200/month in retirement pension and social security.  CSW discussed patient's COPD and inquired as to whether he is having difficulty coping with the diagnosis.  Patient indicated that he tries not to let his illness interfere with him doing things that he wanted to do.  CSW completed that GAD-7 with patient and patient scored a 2.  CSW completed the PHQ-9 and patient scored a 1.  Patient declined behavioral health referral.  CSW finds no further needs.  CSW signing off.   Assessment/plan status:  No Further  Intervention Required Other assessment/ plan:   Information/referral to community resources:    PATIENT'S/FAMILY'S RESPONSE TO PLAN OF CARE: Patient plans on moving in to an apartment at the end of the month.  Patient does not desire referral to behavioral health services.  Patient scored low on the COPD Gold protocol.  No further intervention needed.    [name], [place]

## 2014-12-29 NOTE — H&P (Signed)
PCP:   No PCP Per Patient   Chief Complaint:  sob  HPI: 69 yo male h/o copd, bipolar, CAD comes in with several days of worsening sob and wheezing.  Not getting better with his home medications.  Still occasionally smokes, but is homeless stays with friends a lot who both smoke in the house which affects his breathing.  No fevers.  No n/v/d.  No chest pain.  Has had some improvement with 2 one hour long nebs in the ED, but not back to baseline.  Some chronic ble edema which is not new, did [place].  Review of Systems:  Positive and negative as per HPI otherwise all other systems are negative  Past Medical History: Past Medical History  Diagnosis Date  . Severe aortic valve stenosis     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 at [facility]  . COPD (chronic obstructive pulmonary disease)   . Thoracic ascending aortic aneurysm     repaired 06/2014 at [facility]  . COPD (chronic obstructive pulmonary disease)   . BPH (benign prostatic hyperplasia)   . Pacemaker     battery replaced 06/2014 at [facility]  . SSS (sick sinus syndrome)     s/p PPM   . CAD (coronary artery disease)     ?MI in 2007  . Pneumothorax     traumatic 2007  . Anxiety and depression   . Psychosis   . Bipolar disorder   . CHF (congestive heart failure)   . Neuropathic pain   . Homelessness   . Tobacco abuse    Past Surgical History  Procedure Laterality Date  . Pacemaker placement  2007    battery replaced 07/04/2014  . Hiatal hernia repair    . Knee surgery      L5-6  . Lumbar fusion    . Aortic valve replacement      porcine valve  . Cardiac surgery      ascending thoracic aneurysm repair 06/2014    Medications: Prior to Admission medications   Medication Sig Start Date End Date Taking? Authorizing Provider  albuterol (PROVENTIL HFA;VENTOLIN HFA) 108 (90 BASE) MCG/ACT inhaler Inhale 2 puffs into the lungs every 4 (four) hours as needed for wheezing or shortness of breath. 12/17/14   Yes Alison Murray, MD  LEVITRA 10 MG tablet Take 10 mg by mouth as needed for erectile dysfunction.  12/15/14  Yes Historical Provider, MD  tamsulosin (FLOMAX) 0.4 MG CAPS capsule Take 1 capsule (0.4 mg total) by mouth daily after supper. 12/08/14  Yes Erick Blinks, MD  aspirin EC 81 MG tablet Take 81 mg by mouth daily.    Historical Provider, MD  folic acid (FOLVITE) 1 MG tablet Take 1 tablet (1 mg total) by mouth daily. 10/04/14   Richarda Overlie, MD  furosemide (LASIX) 40 MG tablet Take 1 tablet (40 mg total) by mouth daily. 09/12/14   Velna Hatchet May Agustin, NP  gabapentin (NEURONTIN) 400 MG capsule Take 1 capsule (400 mg total) by mouth 3 (three) times daily. 10/04/14   Richarda Overlie, MD  ipratropium-albuterol (DUONEB) 0.5-2.5 (3) MG/3ML SOLN Take 3 mLs by nebulization every 6 (six) hours as needed. 12/17/14   Alison Murray, MD  levofloxacin (LEVAQUIN) 500 MG tablet Take 1 tablet (500 mg total) by mouth daily. 12/17/14   Alison Murray, MD  predniSONE (DELTASONE) 20 MG tablet Take 1 tablet (20 mg total) by mouth daily with breakfast. 12/28/14   Rolland Porter,  MD    Allergies:   Allergies  Allergen Reactions  . Tiotropium Bromide Monohydrate Nausea Only and Anaphylaxis    Tremors  . Morphine And Related Other (See Comments)    Hallucinations    Social History:  reports that he has been smoking Cigarettes.  He has been smoking about 1.00 pack per day. He does not have any smokeless tobacco history on file. He reports that he drinks alcohol. He reports that he uses illicit drugs (Marijuana).  Family History: Family History  Problem Relation Age of Onset  . CAD Father   . Diabetes Father   . High blood pressure Brother   . Cancer Brother     Physical Exam: Filed Vitals:   12/28/14 2300 12/28/14 2304 12/28/14 2330 12/29/14 0015  BP:  139/83 143/87 149/95  Pulse:  81 89 89  Temp:  97.4 F (36.3 C)  97.6 F (36.4 C)  TempSrc:  Oral  Oral  Resp:  19 21 22   Height:    5' 10.5" (1.791 m)   Weight:    78.064 kg (172 lb 1.6 oz)  SpO2: 93% 92% 93% 95%   General appearance: alert, cooperative and mild distress Head: Normocephalic, without obvious abnormality, atraumatic Eyes: negative Nose: Nares normal. Septum midline. Mucosa normal. No drainage or sinus tenderness. Neck: no JVD and supple, symmetrical, trachea midline Lungs: diminished breath sounds bilaterally Heart: regular rate and rhythm, S1, S2 normal, no murmur, click, rub or gallop Abdomen: soft, non-tender; bowel sounds normal; no masses,  no organomegaly Extremities: extremities normal, atraumatic, no cyanosis or edema Pulses: 2+ and symmetric Skin: Skin color, texture, turgor normal. No rashes or lesions Neurologic: Grossly normal    Labs on Admission:   Recent Labs  12/28/14 2025  NA 143  K 3.7  CL 107  CO2 29  GLUCOSE 112*  BUN 12  CREATININE 0.83  CALCIUM 9.0    Recent Labs  12/28/14 2025  WBC 12.4*  NEUTROABS 8.9*  HGB 13.6  HCT 41.5  MCV 94.3  PLT 218    Recent Labs  12/28/14 2025  TROPONINI <0.03   Radiological Exams on Admission: Dg Chest Port 1 View  12/28/2014   CLINICAL DATA:  Acute onset of shortness of breath and chest tightness. Initial encounter.  EXAM: PORTABLE CHEST - 1 VIEW  COMPARISON:  Chest radiograph performed 12/15/2014  FINDINGS: The lungs are well-aerated. Mild vascular congestion is noted. Minimal bibasilar atelectasis is seen. There is no evidence of pleural effusion or pneumothorax.  The cardiomediastinal silhouette is borderline normal in size. The patient is status post median sternotomy. A pacemaker is noted overlying the left chest wall, with leads ending overlying the right atrium and right ventricle. No acute osseous abnormalities are seen. Clips are seen overlying the right lung apex.  IMPRESSION: Mild vascular congestion noted. Minimal bibasilar atelectasis seen. Lungs otherwise grossly clear.   Electronically Signed   By: Roanna RaiderJeffery  Chang M.D.   On:  12/28/2014 20:34    Assessment/Plan  69 yo male with acute copde  Principal Problem:   COPD exacerbation-  Iv solumedrol, freq nebs.  Zpack.  Will also ck ddimer, if pos will do cta to r/o PE but less likely.    Active Problems:  Stable unless o/w noted   Severe aortic valve stenosis   CAD (coronary artery disease)   H/O aortic valve replacement   History of permanent cardiac pacemaker placement   SOB (shortness of breath)   Bipolar 1 disorder  Chronic combined systolic and diastolic CHF (congestive heart failure)- compensated, but may be playing a role in his sob.  If does not clinically respond to above, consider increasing his diuresis.  Clarify home meds in am.  Admit to tele bed.  Full code.  Leomia Blake A 12/29/2014, 12:28 AM

## 2014-12-29 NOTE — Progress Notes (Signed)
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 DOCUMENTATION CODES Per approved criteria  -Non-severe (moderate) malnutrition in the context of chronic illness   INTERVENTION: -Snacks on morning tray as patient requested  -Recommend Social Services-Patient is homeless and steal for his meals, needs reliable source of food or access to eligible programs  NUTRITION DIAGNOSIS: Limited access to food related to lack of income/homelessness as evidenced by having to steal for food.   Goal: Pt to continue to meet >/= 90% of their estimated nutrition needs   Monitor:  Intake, snack delivery, weight, fluid status, labs, ability to access social services   Reason for Assessment: Consult for Assessment of Nutritional Status  69 y.o. male  Admitting Dx: COPD exacerbation  ASSESSMENT: 69 yo male h/o copd, bipolar, CAD comes in with several days of worsening sob and wheezing.   Pt reports feeling much better since coming to the hospital,. He has been eating all his meals and feels his strength returning. He says his normal weight is 173-174. He is recorded at 172 now, but he has a little fluid on him.   Pt mentioned that he is homeless and has no money for food. He says he steals snacks from grocery stores at night to manage his reported hypoglycemia and eats his food while in the store to get his one meal a day. He reports he has to do this to survive. I doubt he can continue this for too long, recommend that someone speaks to him about programs he is eligible for so that he is able to feed himself.   He is at very high risk for decline in nutritional status given his moderate malnutrition, chronic disease state, and poor food security.   Nutrition Focused Physical Exam:  Subcutaneous Fat:  Orbital Region: Severe Upper Arm Region: mild Thoracic and Lumbar Region:  none  Muscle:  Temple Region:mild Clavicle Bone Region:mild Clavicle and Acromion Bone Region: none Scapular Bone Region: none Dorsal Hand: mild Patellar Region:none Anterior Thigh Region:none Posterior Calf Region:none  Edema:+1 legs    Height: Ht Readings from Last 1 Encounters:  12/29/14 5' 10.5" (1.791 m)    Weight: Wt Readings from Last 1 Encounters:  12/29/14 172 lb 1.6 oz (78.064 kg)    Ideal Body Weight: 169  % Ideal Body Weight:102%  Wt Readings from Last 10 Encounters:  12/29/14 172 lb 1.6 oz (78.064 kg)  12/15/14 169 lb 3.2 oz (76.749 kg)  12/08/14 170 lb 6.4 oz (77.293 kg)  11/29/14 175 lb (79.379 kg)  10/04/14 168 lb 3.2 oz (76.295 kg)  09/20/14 165 lb (74.844 kg)  09/18/14 168 lb (76.204 kg)  09/18/14 168 lb (76.204 kg)  09/14/14 165 lb (74.844 kg)  09/12/14 167 lb (75.751 kg)    Usual Body Weight: 173-174  % Usual Body Weight: 99%  BMI:  Body mass index is 24.34 kg/(m^2).  Estimated Nutritional Needs: Kcal: 1950-2200 (25-28 kcal/kg) Protein: 78-94 G Pro Fluid: 1.9-2.2 liters  Skin: WDL  Diet Order: Diet Heart  EDUCATION NEEDS: -No education needs identified at this time  No intake or output data in the 24 hours ending 12/29/14 1045  Last BM: 3/1    Labs:   Recent Labs Lab 12/28/14 2025  NA 143  K 3.7  CL 107  CO2 29  BUN 12  CREATININE 0.83  CALCIUM 9.0  GLUCOSE 112*    CBG (last 3)  No results for input(s): GLUCAP in the last 72 hours.  Scheduled Meds: . aspirin EC  81 mg Oral Daily  . [START ON 12/30/2014] azithromycin  250 mg Oral Daily  . enoxaparin (LOVENOX) injection  40 mg Subcutaneous Q24H  . folic acid  1 mg Oral Daily  . furosemide  40 mg Oral Daily  . gabapentin  400 mg Oral TID  . guaiFENesin  600 mg Oral BID  . ipratropium-albuterol  3 mL Nebulization Q6H  . methylPREDNISolone (SOLU-MEDROL) injection  80 mg Intravenous Q12H  . sodium chloride  3 mL Intravenous Q12H  . sodium chloride  3 mL  Intravenous Q12H  . tamsulosin  0.4 mg Oral QPC supper    Continuous Infusions:   Past Medical History  Diagnosis Date  . Severe aortic valve stenosis     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 at [facility]  . COPD (chronic obstructive pulmonary disease)   . Thoracic ascending aortic aneurysm     repaired 06/2014 at [facility]  . COPD (chronic obstructive pulmonary disease)   . BPH (benign prostatic hyperplasia)   . Pacemaker     battery [facility] 06/2014 at [facility]  . SSS (sick sinus syndrome)     s/p PPM   . CAD (coronary artery disease)     ?MI in 2007  . Pneumothorax     traumatic 2007  . Anxiety and depression   . Psychosis   . Bipolar disorder   . CHF (congestive heart failure)   . Neuropathic pain   . Homelessness   . Tobacco abuse     Past Surgical History  Procedure Laterality Date  . Pacemaker placement  2007    battery replaced 07/04/2014  . Hiatal hernia repair    . Knee surgery      L5-6  . Lumbar fusion    . Aortic valve replacement      porcine valve  . Cardiac surgery      ascending thoracic aneurysm repair 06/2014    Christophe Louis RD, LDN Nutrition Pager: [phone] 12/29/2014 10:45 AM

## 2014-12-29 NOTE — Progress Notes (Signed)
The patient is a 69 year old man with a history of COPD, aortic valve replacement with porcine valve, thoracic aortic aneurysm repair, status post pacemaker for sick sinus syndrome, chronic combined systolic and diastolic heart failure, and bipolar disorder, who was admitted early this morning (again) for recurrent COPD with exacerbation. The patient was briefly seen and examined. His chart, vital signs, laboratory studies were reviewed. Agree with current management with additions below:  -We'll give the patient an additional dose of Lasix, per request and given IV for mild peripheral edema. -Will order saline nasal spray for nasal congestion. -We'll order tobacco cessation counseling. -We'll order sliding scale NovoLog for steroid-induced hyperglycemia. -We will add H2 blocker prophylactically because he has been on IV steroids on and off for the past 4 weeks.

## 2014-12-29 NOTE — ED Notes (Signed)
Walked into room to transport pt. Upstairs and found pt. sitting on side of bed with his street clothes on and all monitor leads disconnected. When asked why, pt. Reports "I was cold". Explained to pt. That he needs to leave gown and leads on and that I would be glad to get him a blanket. Leads placed back on pt. And blanket provided. Pt. To be transported upstairs.

## 2014-12-29 NOTE — Care Management Note (Addendum)
    Page 1 of 1   12/30/2014     11:42:06 AM CARE MANAGEMENT NOTE 12/30/2014  Patient:  Jennings BooksBOSWELL,Koa H   Account Number:  [id number]  Date Initiated:  12/29/2014  Documentation initiated by:  Sharrie RothmanBLACKWELL,Denita Lun C  Subjective/Objective Assessment:   Pt admitted from home with COPD exacerbation. Pt lives with friends sometimes and in his vehicle sometimes. Pt is very noncompliant with treatment. Pt has PCP appt scheduled from last visit. Pt has 2 neb machines, one being portable.     Action/Plan:   Will continue to follow for discharge planning needs. CM did discuss with pt about noncompliance and barriers for compliance. [facility]   Anticipated DC Date:  01/02/2015   Anticipated DC Plan:  HOME/SELF CARE      DC Planning Services  CM consult      Choice offered to / List presented to:             Status of service:  Completed, signed off Medicare Important Message given?  YES (If response is "NO", the following Medicare IM given date fields will be blank) Date Medicare IM given:  12/30/2014 Medicare IM given by:  Sharrie RothmanBLACKWELL,Wayburn Shaler C Date Additional Medicare IM given:   Additional Medicare IM given by:    Discharge Disposition:  HOME/SELF CARE  Per UR Regulation:    If discussed at Long Length of Stay Meetings, dates discussed:    Comments:  12/30/14 1135 Arlyss Queenammy Mahnoor Mathisen, RN BSN CM Anticipate discharge within 24 hours. Pt is very noncompliant with treatment and medications. Pt stated that he was going to stay in a hotel in [place]. Dr. Juanetta GoslingHawkins office will call pt with follow up appt for COPD gold. Pts appt still stand with Dr. Roxy HorsemanonDeigo in April. Pt is aware. [facility]. Pt is very mobile and does not stay in one place too long so [facility] will not be able to follow consistently. Pt is refusing any placement of any kind.  12/29/14 1505 Arlyss Queenammy Sandara Tyree, RN BSN CM

## 2014-12-29 NOTE — Evaluation (Signed)
Occupational Therapy Evaluation Patient Details Name: [name] [name] MRN: [id number] DOB: 04-Nov-1945 Today's Date: 12/29/2014    History of Present Illness Pt is 69 yo male h/o copd, bipolar, CAD comes in with several days of worsening SOB and wheezing. Not getting better with his home medications. Still smokes approximately 1 pack of cigarettes per week. Pt is homeless and stays with friends a lot who both smoke in the house which affects his breathing. Some chronic ble edema which is not new, did travel to [place] 2 weeks ago for car race. Pt reports passing out due to SOB at race.    Clinical Impression   PTA pt was staying at a friends house due to being homeless. Pt c/o SOB upon exertion or when experiencing increased levels of stress. Pt reports independence in all B/IADLs, does not use any equipment during functional tasks. Pt demonstrates good UE strength and ROM, pt was independent in dressing tasks and bed mobility during evaluation. Pt aware of the benefits of rest breaks as needed during functional tasks to reduce SOB and to conserve his energy. No skilled OT services required to increase functioning, pt to D/C when medically stable.     Follow Up Recommendations  No OT follow up;Supervision - Intermittent          Precautions / Restrictions Precautions Precautions: Fall      Mobility Bed Mobility Overal bed mobility: Independent       Supine to sit: Modified independent (Device/Increase time)                ADL Overall ADL's : Independent;At baseline Eating/Feeding: Independent                   Lower Body Dressing: Independent                 General ADL Comments: Pt reports ability to perform all ADLs independently. Pt is currently homeless and takes sponge baths either at friend's house or uses public restrooms for hygiene needs. Pt takes breaks due to SOB.                Pertinent Vitals/Pain Pain Assessment: No/denies pain      Hand Dominance Right   Extremity/Trunk Assessment Upper Extremity Assessment Upper Extremity Assessment: Overall WFL for tasks assessed (ROM and strength are WNL. )              Cognition Arousal/Alertness: Awake/alert Behavior During Therapy: WFL for tasks assessed/performed Overall Cognitive Status: History of cognitive impairments - at baseline                                Home Living Family/patient expects to be discharged to:: Unsure Living Arrangements: Non-relatives/Friends                                      Prior Functioning/Environment Level of Independence: Independent                                       End of Session    Activity Tolerance: Patient tolerated treatment well (Pt limited by SOB at times) Patient left: in bed;with call bell/phone within reach   Time: 4098-1191 OT Time Calculation (min): 22 min Charges:  OT General  Charges $OT Visit: 1 Procedure OT Evaluation $Initial OT Evaluation Tier I: 1 Procedure  Ezra SitesLeslie Arsh Feutz, OTR/L [phone]  12/29/2014, 9:32 AM

## 2014-12-29 NOTE — Evaluation (Signed)
Physical Therapy Evaluation Patient Details Name: [name] [name] MRN: [id number] DOB: [birth year] Today's Date: 12/29/2014   History of Present Illness  Pt is 69 yo male h/o copd, bipolar, CAD comes in with several days of worsening SOB and wheezing. Not getting better with his home medications. Still smokes approximately 1 pack of cigarettes per week. Pt is homeless and stays with friends a lot who both smoke in the house which affects his breathing. Some chronic ble edema which is not new, did travel to [place] 2 weeks ago for car race.  Clinical Impression   Pt was seen for evaluation.  He was dyspneic at the time of my visit but O2 sat=97%.  His strength, balance and mobility are all WNL.  He was instructed in energy conservation.  No further PT is needed.                            Follow Up Recommendations No PT follow up    Equipment Recommendations  None recommended by PT    Recommendations for Other Services   none    Precautions / Restrictions Precautions Precautions: None Restrictions Weight Bearing Restrictions: No      Mobility  Bed Mobility Overal bed mobility: Independent       Supine to sit: Modified independent (Device/Increase time) Sit to supine: Independent      Transfers Overall transfer level: Independent                  Ambulation/Gait Ambulation/Gait assistance: Independent Ambulation Distance (Feet): 30 Feet Assistive device: None Gait Pattern/deviations: WFL(Within Functional Limits)   Gait velocity interpretation: at or above normal speed for age/gender    Stairs            Wheelchair Mobility    Modified Rankin (Stroke Patients Only)       Balance Overall balance assessment: Modified Independent                                           Pertinent Vitals/Pain Pain Assessment: No/denies pain    Home Living Family/patient expects to be discharged to:: Unsure Living  Arrangements: Non-relatives/Friends               Additional Comments: pt does not have a permanent address..has been sleeping out of his car    Prior Function Level of Independence: Independent   Gait / Transfers Assistance Needed: no assistance for gait  ADL's / Homemaking Assistance Needed: independent        Hand Dominance   Dominant Hand: Right    Extremity/Trunk Assessment   Upper Extremity Assessment: Overall WFL for tasks assessed (ROM and strength are WNL. )           Lower Extremity Assessment: Overall WFL for tasks assessed      Cervical / Trunk Assessment: Normal  Communication   Communication: No difficulties  Cognition Arousal/Alertness: Awake/alert Behavior During Therapy: WFL for tasks assessed/performed Overall Cognitive Status: History of cognitive impairments - at baseline                      General Comments      Exercises        Assessment/Plan    PT Assessment Patent does not need any further PT services  PT Diagnosis     PT  Problem List    PT Treatment Interventions     PT Goals (Current goals can be found in the Care Plan section) Acute Rehab PT Goals PT Goal Formulation: All assessment and education complete, DC therapy    Frequency     Barriers to discharge        Co-evaluation               End of Session Equipment Utilized During Treatment: Gait belt Activity Tolerance: Patient tolerated treatment well Patient left: in bed           Time: [id number] PT Time Calculation (min) (ACUTE ONLY): 21 min   Charges:   PT Evaluation $Initial PT Evaluation Tier I: 1 Procedure     PT G CodesMyrlene Broker:        Jordyn Hofacker L 12/29/2014, 12:22 PM

## 2014-12-29 NOTE — Progress Notes (Signed)
UR chart review completed.  

## 2014-12-30 DIAGNOSIS — Z720 Tobacco use: Secondary | ICD-10-CM

## 2014-12-30 LAB — BASIC METABOLIC PANEL
Anion gap: 7 (ref 5–15)
BUN: 20 mg/dL (ref 6–23)
CO2: 30 mmol/L (ref 19–32)
Calcium: 9.4 mg/dL (ref 8.4–10.5)
Chloride: 104 mmol/L (ref 96–112)
Creatinine, Ser: 0.85 mg/dL (ref 0.50–1.35)
GFR calc Af Amer: >90 mL/min (ref >90)
GFR calc non Af Amer: 88 mL/min — ABNORMAL LOW (ref >90)
Glucose, Bld: 150 mg/dL — ABNORMAL HIGH (ref 70–99)
Potassium: 4.6 mmol/L (ref 3.5–5.1)
Sodium: 141 mmol/L (ref 135–145)

## 2014-12-30 LAB — GLUCOSE, CAPILLARY
Glucose-Capillary: 124 mg/dL — ABNORMAL HIGH (ref 70–99)
Glucose-Capillary: 139 mg/dL — ABNORMAL HIGH (ref 70–99)
Glucose-Capillary: 168 mg/dL — ABNORMAL HIGH (ref 70–99)
Glucose-Capillary: 128 mg/dL — ABNORMAL HIGH (ref 70–99)

## 2014-12-30 LAB — CBC
HCT: 42.6 % (ref 39.0–52.0)
Hemoglobin: 13.9 g/dL (ref 13.0–17.0)
MCH: 31 pg (ref 26.0–34.0)
MCHC: 32.6 g/dL (ref 30.0–36.0)
MCV: 94.9 fL (ref 78.0–100.0)
Platelets: 252 10*3/uL (ref 150–400)
RBC: 4.49 MIL/uL (ref 4.22–5.81)
RDW: 16.3 % — ABNORMAL HIGH (ref 11.5–15.5)
WBC: 18.4 10*3/uL — ABNORMAL HIGH (ref 4.0–10.5)

## 2014-12-30 NOTE — Progress Notes (Signed)
Patient requested to go out to his truck to get some paper work. MD was notified MD was ok with patient being transported to truck with a staff member. Patient was transported via wheelchair with nurse and nursing student to his truck and was wrapped in a jacket and two blankets. Patient was able to get his papers from his truck and get back into his room safely.

## 2014-12-30 NOTE — Progress Notes (Signed)
Patient states that is going to leave the floor to go to his truck. Says that he needs to get his phone charger. Explained to the patient that I cannot let him leave the floor. He states "well get my discharge papers". Explained to the patient that it is important for him to remain in the hospital to continue his treatment. Patient was offered a Advertising account plannerphone charger to borrow. Patient seems to have calmed down and states he understands that he is not allowed to leave the floor.

## 2014-12-30 NOTE — Progress Notes (Signed)
[facility] PROGRESS NOTE  [name] [name] ZOX:[id number] DOB: 1946/04/21 DOA: 12/28/2014 PCP: No PCP Per Patient    Code Status: Full code Family Communication: Family not available; discussed with a shunt Disposition Plan: Discharge when clinically appropriate, likely tomorrow.   Consultants:  Pulmonology  Procedures:  None  Antibiotics:  Azithromycin 3/3>>  HPI/Subjective: Patient says that he is breathing better. He has less swelling in his ankles. No complains of chest pain.  Objective: Filed Vitals:   12/30/14 1500  BP: 123/60  Pulse: 86  Temp: 97.7 F (36.5 C)  Resp: 20   oxygen saturation 96% on room air.  Intake/Output Summary (Last 24 hours) at 12/30/14 1550 Last data filed at 12/30/14 1245  Gross per 24 hour  Intake    483 ml  Output    200 ml  Net    283 ml   Filed Weights   12/28/14 1937 12/29/14 0015  Weight: 78.019 kg (172 lb) 78.064 kg (172 lb 1.6 oz)    Exam:   General:  Alert talkative 69 year old man sitting up in bed, in no acute distress.  Cardiovascular: S1, S2, with a soft systolic murmur and S2 click  Respiratory: Scattered mild fine wheezes. Breathing nonlabored at rest, slightly dyspneic when speaking.  Abdomen: Positive bowel sounds, soft, nontender, nondistended.  Musculoskeletal: Lower extremities bilaterally with trace of pedal edema; varicose veins.  Neurologic/psychiatric: He is alert and oriented 3. His speech is clear, not pressured. Cranial nerves grossly intact.   Data Reviewed: Basic Metabolic Panel:  Recent Labs Lab 12/28/14 2025 12/30/14 0617  NA 143 141  K 3.7 4.6  CL 107 104  CO2 29 30  GLUCOSE 112* 150*  BUN 12 20  CREATININE 0.83 0.85  CALCIUM 9.0 9.4   Liver Function Tests: No results for input(s): AST, ALT, ALKPHOS, BILITOT, PROT, ALBUMIN in the last 168 hours. No results for input(s): LIPASE, AMYLASE in the last 168 hours. No results for input(s): AMMONIA in the last 168  hours. CBC:  Recent Labs Lab 12/28/14 2025 12/30/14 0617  WBC 12.4* 18.4*  NEUTROABS 8.9*  --   HGB 13.6 13.9  HCT 41.5 42.6  MCV 94.3 94.9  PLT 218 252   Cardiac Enzymes:  Recent Labs Lab 12/28/14 2025  TROPONINI <0.03   BNP (last 3 results)  Recent Labs  12/05/14 2056 12/15/14 1604 12/28/14 2025  BNP 51.0 58.0 131.0*    ProBNP (last 3 results)  Recent Labs  09/01/14 1722 09/04/14 2121 09/24/14 0704  PROBNP 653.8* 333.0* 798.5*    CBG:  Recent Labs Lab 12/29/14 1710 12/29/14 2041 12/30/14 0805 12/30/14 1128  GLUCAP 111* 155* 124* 139*    No results found for this or any previous visit (from the past 240 hour(s)).   Studies: Dg Chest Port 1 View  12/28/2014   CLINICAL DATA:  Acute onset of shortness of breath and chest tightness. Initial encounter.  EXAM: PORTABLE CHEST - 1 VIEW  COMPARISON:  Chest radiograph performed 12/15/2014  FINDINGS: The lungs are well-aerated. Mild vascular congestion is noted. Minimal bibasilar atelectasis is seen. There is no evidence of pleural effusion or pneumothorax.  The cardiomediastinal silhouette is borderline normal in size. The patient is status post median sternotomy. A pacemaker is noted overlying the left chest wall, with leads ending overlying the right atrium and right ventricle. No acute osseous abnormalities are seen. Clips are seen overlying the right lung apex.  IMPRESSION: Mild vascular congestion noted. Minimal bibasilar atelectasis seen. Lungs otherwise  grossly clear.   Electronically Signed   By: Roanna RaiderJeffery  Chang M.D.   On: 12/28/2014 20:34    Scheduled Meds: . aspirin EC  81 mg Oral Daily  . azithromycin  250 mg Oral Daily  . enoxaparin (LOVENOX) injection  40 mg Subcutaneous Q24H  . famotidine  20 mg Oral Daily  . folic acid  1 mg Oral Daily  . furosemide  40 mg Oral Daily  . gabapentin  400 mg Oral TID  . guaiFENesin  600 mg Oral BID  . insulin aspart  0-15 Units Subcutaneous TID WC  . insulin  aspart  0-5 Units Subcutaneous QHS  . ipratropium-albuterol  3 mL Nebulization Q6H  . methylPREDNISolone (SOLU-MEDROL) injection  80 mg Intravenous Q12H  . potassium chloride  20 mEq Oral BID  . sodium chloride  3 mL Intravenous Q12H  . sodium chloride  3 mL Intravenous Q12H  . tamsulosin  0.4 mg Oral QPC supper   Continuous Infusions:   Assessment and plan: Principal Problem:   COPD exacerbation Active Problems:   H/O aortic valve replacement   Chronic combined systolic and diastolic CHF (congestive heart failure)   Severe aortic valve stenosis   CAD (coronary artery disease)   History of permanent cardiac pacemaker placement   SOB (shortness of breath)   Tobacco abuse   Neuropathic pain   1. Recurrent COPD exacerbation. This is the patient's third hospitalization in 4 weeks. His frequent presentations to the ED is secondary to noncompliance because of insurance issues with his medications and also in part homelessness.-He lives in his truck most of the time. He has improved on treatment with azithromycin, IV Solu-Medrol, nebulizers-bronchodilators, and oxygen as needed.  Leukocytosis and hyperglycemia, secondary to steroid treatment. Continue sliding scale NovoLog and supportive treatment.  Tobacco abuse. The patient was advised to stop smoking.  Chronic combined systolic and diastolic congestive heart failure/status post aortic valve replacement September 2015/history of aortic valve replacement/thoracic aortic aneurysm repair. Mostly compensated. He was given IV Lasix with a small increase in ankle edema, but would not call this decompensated heart failure. We'll continue oral Lasix.   Time spent: 35 minutes    [facility] Wahpet[facility] AscFISHER,[name] Anton  [facility] Pager [phone]. If 7PM-7AM, please contact night-coverage at [email or web address], password [email or web address] 12/30/2014, 3:50 PM  LOS: 2 days

## 2014-12-30 NOTE — Progress Notes (Signed)
Patient refused dose of lovenox and SCD's. Patient was educated about the importance of the lovenox and SCD's and the prevention of blood clots from forming. Patient states he is up walking and does not feel like he needs them at this time. Will continue to monitor patient at this time.

## 2014-12-30 NOTE — Progress Notes (Signed)
Called Dr. Juanetta GoslingHawkins office to set up a follow-up appointment for in a week for patient when he is discharged. Dr. Juanetta GoslingHawkins office stated they would call the patient with the appointment date and time. Patient is aware and expecting a phone call.

## 2014-12-30 NOTE — Consult Note (Signed)
NAMBarron Schmid:  [name], [name]              ACCOUNT NO.:  [id number]  MEDICAL RECORD NO.:  [id number]  LOCATION:  A324                          FACILITY:  [facility]  PHYSICIAN:  Errick Salts L. Juanetta GoslingHawkins, M.D.DATE OF BIRTH:  1946-08-02  DATE OF CONSULTATION: DATE OF DISCHARGE:                                CONSULTATION   The patient of [facility].  REASON FOR CONSULTATION:  Chronic obstructive pulmonary disease GOLD.  HISTORY:  This is a 69 year old who has been in and out of the hospital on multiple occasions with COPD.  He was admitted early on the morning of December 29, 2014, because of shortness of breath and wheezing.  He says he smokes some.  He is homeless and has been living in his car and says that he has been staying with friends who both smoke in the house.  He says he has marked variation in his breathing from day-to-day.  He said [place] was [place]  He does not get his medications regularly and that impacts his problem.  PAST MEDICAL HISTORY:  Positive for aortic stenosis with a porcine valve replacement, COPD, aortic aneurysm repair, BPH, pacemaker, coronary artery occlusive disease, anxiety and depression with probable bipolar disorder.  He has had episodes of congestive heart failure.  PAST SURGICAL HISTORY:  Surgically, he has had a hiatal hernia, knee surgery, lumbar fusion, aortic valve replacement, and ascending thoracic aneurysm repair.  MEDICATIONS:  His medications are supposed to be albuterol inhaler as needed, Levitra 10 mg as needed, Flomax 0.4 daily, aspirin 81 mg daily, folic acid 1 mg daily, Lasix 40 mg daily, gabapentin 400 mg t.i.d., albuterol and Atrovent nebulizer treatments 4 times a day, Levaquin 500 mg daily, and prednisone.  It is not clear what he is taking.  ALLERGIES:  He said that he is allergic to [facility] and MORPHINE.  SOCIAL HISTORY:  He has about a 60 pack year smoking history.  He also says that he  frequently drinks alcohol, starting often in the morning with his coffee.  He also smokes marijuana.  FAMILY HISTORY:  Positive for coronary artery disease and diabetes.  He had a brother who had cancer.  REVIEW OF SYSTEMS:  Except as mentioned is negative and per the history and physical.  PHYSICAL EXAMINATION:  GENERAL:  He is awake and alert.  Somewhat tangential. HEENT:  His pupils are reactive.  Nose and throat are clear.  Mucous membranes are moist. NECK:  Supple. RESPIRATORY:  His chest shows diminished breath sounds, prolonged expiration and end-expiratory wheezes. CARDIOVASCULAR:  His heart is regular without gallop. GI:  His abdomen is soft. EXTREMITIES:  No edema. CENTRAL NERVOUS SYSTEM:  Grossly intact.  IMAGING STUDIES:  Chest x-ray does not show pneumonia.  ASSESSMENT:  He does have severe chronic obstructive pulmonary disease and obviously his situation is impacted by the fact that he has a very poor social situation.  He is on appropriate treatment, but the difficulty is going to be keeping him on appropriate treatment.  He is being set up with the primary care physician, and I will be glad to see him for his chronic obstructive pulmonary  disease.     [name], M.D.     ELH/MEDQ  D:  12/29/2014  T:  12/30/2014  Job:  [id number]

## 2014-12-30 NOTE — Progress Notes (Signed)
Subjective: He says he feels much better. He has no new complaints. He says he is going to cooperate with treatment. My consult note is dated the fourth but was done and dictated on the third.  Objective: Vital signs in last 24 hours: Temp:  [97.6 F (36.4 C)-98.1 F (36.7 C)] 97.6 F (36.4 C) (03/04 0540) Pulse Rate:  [79-88] 88 (03/04 0540) Resp:  [20] 20 (03/04 0540) BP: (121-140)/(55-73) 122/55 mmHg (03/04 0540) SpO2:  [93 %-98 %] 96 % (03/04 0604) Weight change:  Last BM Date: 12/29/14  Intake/Output from previous day:    PHYSICAL EXAM General appearance: alert, cooperative and no distress Resp: clear to auscultation bilaterally Cardio: regular rate and rhythm, S1, S2 normal, no murmur, click, rub or gallop GI: soft, non-tender; bowel sounds normal; no masses,  no organomegaly Extremities: extremities normal, atraumatic, no cyanosis or edema  Lab Results:  Results for orders placed or performed during the hospital encounter of 12/28/14 (from the past 48 hour(s))  Basic metabolic panel     Status: Abnormal   Collection Time: 12/28/14  8:25 PM  Result Value Ref Range   Sodium 143 135 - 145 mmol/L   Potassium 3.7 3.5 - 5.1 mmol/L   Chloride 107 96 - 112 mmol/L   CO2 29 19 - 32 mmol/L   Glucose, Bld 112 (H) 70 - 99 mg/dL   BUN 12 6 - 23 mg/dL   Creatinine, Ser 0.83 0.50 - 1.35 mg/dL   Calcium 9.0 8.4 - 10.5 mg/dL   GFR calc non Af Amer 88 (L) >90 mL/min   GFR calc Af Amer >90 >90 mL/min    Comment: (NOTE) The eGFR has been calculated using the CKD EPI equation. This calculation has not been validated in all clinical situations. eGFR's persistently <90 mL/min signify possible Chronic Kidney Disease.    Anion gap 7 5 - 15  CBC with Differential/Platelet     Status: Abnormal   Collection Time: 12/28/14  8:25 PM  Result Value Ref Range   WBC 12.4 (H) 4.0 - 10.5 K/uL   RBC 4.40 4.22 - 5.81 MIL/uL   Hemoglobin 13.6 13.0 - 17.0 g/dL   HCT 41.5 39.0 - 52.0 %   MCV  94.3 78.0 - 100.0 fL   MCH 30.9 26.0 - 34.0 pg   MCHC 32.8 30.0 - 36.0 g/dL   RDW 16.4 (H) 11.5 - 15.5 %   Platelets 218 150 - 400 K/uL   Neutrophils Relative % 72 43 - 77 %   Neutro Abs 8.9 (H) 1.7 - 7.7 K/uL   Lymphocytes Relative 14 12 - 46 %   Lymphs Abs 1.7 0.7 - 4.0 K/uL   Monocytes Relative 10 3 - 12 %   Monocytes Absolute 1.3 (H) 0.1 - 1.0 K/uL   Eosinophils Relative 4 0 - 5 %   Eosinophils Absolute 0.5 0.0 - 0.7 K/uL   Basophils Relative 0 0 - 1 %   Basophils Absolute 0.0 0.0 - 0.1 K/uL  Troponin I     Status: None   Collection Time: 12/28/14  8:25 PM  Result Value Ref Range   Troponin I <0.03 <0.031 ng/mL    Comment:        NO INDICATION OF MYOCARDIAL INJURY.   Brain natriuretic peptide     Status: Abnormal   Collection Time: 12/28/14  8:25 PM  Result Value Ref Range   B Natriuretic Peptide 131.0 (H) 0.0 - 100.0 pg/mL  D-dimer, quantitative  Status: Abnormal   Collection Time: 12/29/14 12:38 AM  Result Value Ref Range   D-Dimer, Quant 0.64 (H) 0.00 - 0.48 ug/mL-FEU    Comment:        AT THE INHOUSE ESTABLISHED CUTOFF VALUE OF 0.48 ug/mL FEU, THIS ASSAY HAS BEEN DOCUMENTED IN THE LITERATURE TO HAVE A SENSITIVITY AND NEGATIVE PREDICTIVE VALUE OF AT LEAST 98 TO 99%.  THE TEST RESULT SHOULD BE CORRELATED WITH AN ASSESSMENT OF THE CLINICAL PROBABILITY OF DVT / VTE.   Glucose, capillary     Status: Abnormal   Collection Time: 12/29/14  5:10 PM  Result Value Ref Range   Glucose-Capillary 111 (H) 70 - 99 mg/dL   Comment 1 Notify RN   Glucose, capillary     Status: Abnormal   Collection Time: 12/29/14  8:41 PM  Result Value Ref Range   Glucose-Capillary 155 (H) 70 - 99 mg/dL  CBC     Status: Abnormal   Collection Time: 12/30/14  6:17 AM  Result Value Ref Range   WBC 18.4 (H) 4.0 - 10.5 K/uL   RBC 4.49 4.22 - 5.81 MIL/uL   Hemoglobin 13.9 13.0 - 17.0 g/dL   HCT 42.6 39.0 - 52.0 %   MCV 94.9 78.0 - 100.0 fL   MCH 31.0 26.0 - 34.0 pg   MCHC 32.6 30.0 -  36.0 g/dL   RDW 16.3 (H) 11.5 - 15.5 %   Platelets 252 150 - 400 K/uL  Basic metabolic panel     Status: Abnormal   Collection Time: 12/30/14  6:17 AM  Result Value Ref Range   Sodium 141 135 - 145 mmol/L   Potassium 4.6 3.5 - 5.1 mmol/L    Comment: DELTA CHECK NOTED   Chloride 104 96 - 112 mmol/L   CO2 30 19 - 32 mmol/L   Glucose, Bld 150 (H) 70 - 99 mg/dL   BUN 20 6 - 23 mg/dL   Creatinine, Ser 0.85 0.50 - 1.35 mg/dL   Calcium 9.4 8.4 - 10.5 mg/dL   GFR calc non Af Amer 88 (L) >90 mL/min   GFR calc Af Amer >90 >90 mL/min    Comment: (NOTE) The eGFR has been calculated using the CKD EPI equation. This calculation has not been validated in all clinical situations. eGFR's persistently <90 mL/min signify possible Chronic Kidney Disease.    Anion gap 7 5 - 15  Glucose, capillary     Status: Abnormal   Collection Time: 12/30/14  8:05 AM  Result Value Ref Range   Glucose-Capillary 124 (H) 70 - 99 mg/dL    ABGS No results for input(s): PHART, PO2ART, TCO2, HCO3 in the last 72 hours.  Invalid input(s): PCO2 CULTURES No results found for this or any previous visit (from the past 240 hour(s)). Studies/Results: Dg Chest Port 1 View  12/28/2014   CLINICAL DATA:  Acute onset of shortness of breath and chest tightness. Initial encounter.  EXAM: PORTABLE CHEST - 1 VIEW  COMPARISON:  Chest radiograph performed 12/15/2014  FINDINGS: The lungs are well-aerated. Mild vascular congestion is noted. Minimal bibasilar atelectasis is seen. There is no evidence of pleural effusion or pneumothorax.  The cardiomediastinal silhouette is borderline normal in size. The patient is status post median sternotomy. A pacemaker is noted overlying the left chest wall, with leads ending overlying the right atrium and right ventricle. No acute osseous abnormalities are seen. Clips are seen overlying the right lung apex.  IMPRESSION: Mild vascular congestion noted. Minimal bibasilar atelectasis seen.  Lungs otherwise  grossly clear.   Electronically Signed   By: Garald Balding M.D.   On: 12/28/2014 20:34    Medications:  Prior to Admission:  Prescriptions prior to admission  Medication Sig Dispense Refill Last Dose  . albuterol (PROVENTIL HFA;VENTOLIN HFA) 108 (90 BASE) MCG/ACT inhaler Inhale 2 puffs into the lungs every 4 (four) hours as needed for wheezing or shortness of breath. 1 Inhaler 3 Past Month at Unknown time  . aspirin EC 81 MG tablet Take 81 mg by mouth daily.   Past Month at Unknown time  . folic acid (FOLVITE) 1 MG tablet Take 1 tablet (1 mg total) by mouth daily. 30 tablet 0 Past Week at Unknown time  . furosemide (LASIX) 40 MG tablet Take 1 tablet (40 mg total) by mouth daily. 30 tablet  Past Month at Unknown time  . ipratropium-albuterol (DUONEB) 0.5-2.5 (3) MG/3ML SOLN Take 3 mLs by nebulization every 6 (six) hours as needed. 24 mL 0 Past Month at Unknown time  . LEVITRA 10 MG tablet Take 10 mg by mouth as needed for erectile dysfunction.   0 Past Month at Unknown time  . tamsulosin (FLOMAX) 0.4 MG CAPS capsule Take 1 capsule (0.4 mg total) by mouth daily after supper. 30 capsule 1 Past Month at Unknown time  . gabapentin (NEURONTIN) 400 MG capsule Take 1 capsule (400 mg total) by mouth 3 (three) times daily. (Patient not taking: Reported on 12/29/2014) 90 capsule 0 Not Taking at Unknown time  . levofloxacin (LEVAQUIN) 500 MG tablet Take 1 tablet (500 mg total) by mouth daily. (Patient not taking: Reported on 12/29/2014) 4 tablet 0 Not Taking at Unknown time   Scheduled: . aspirin EC  81 mg Oral Daily  . azithromycin  250 mg Oral Daily  . enoxaparin (LOVENOX) injection  40 mg Subcutaneous Q24H  . famotidine  20 mg Oral Daily  . folic acid  1 mg Oral Daily  . furosemide  40 mg Oral Daily  . gabapentin  400 mg Oral TID  . guaiFENesin  600 mg Oral BID  . insulin aspart  0-15 Units Subcutaneous TID WC  . insulin aspart  0-5 Units Subcutaneous QHS  . ipratropium-albuterol  3 mL Nebulization  Q6H  . methylPREDNISolone (SOLU-MEDROL) injection  80 mg Intravenous Q12H  . potassium chloride  20 mEq Oral BID  . sodium chloride  3 mL Intravenous Q12H  . sodium chloride  3 mL Intravenous Q12H  . tamsulosin  0.4 mg Oral QPC supper   Continuous:  YHT:MBPJPE chloride, albuterol, sodium chloride, sodium chloride  Assesment: He was admitted with COPD exacerbation and is known to have combined systolic and diastolic heart failure as well. This is complicated by bipolar 1 disorder. He says he feels better but feels like he needs to be in the hospital 1 more day. Principal Problem:   COPD exacerbation Active Problems:   Severe aortic valve stenosis   CAD (coronary artery disease)   H/O aortic valve replacement   History of permanent cardiac pacemaker placement   SOB (shortness of breath)   Bipolar 1 disorder   Chronic combined systolic and diastolic CHF (congestive heart failure)    Plan: Continue current treatments. I will be out of town tomorrow but back on the sixth.    LOS: 2 days   Cimone Fahey L 12/30/2014, 8:39 AM

## 2014-12-31 LAB — GLUCOSE, CAPILLARY: Glucose-Capillary: 133 mg/dL — ABNORMAL HIGH (ref 70–99)

## 2014-12-31 MED ORDER — IPRATROPIUM-ALBUTEROL 0.5-2.5 (3) MG/3ML IN SOLN: 3.0000 mL | RESPIRATORY_TRACT | Status: DC | PRN

## 2014-12-31 MED ORDER — IPRATROPIUM-ALBUTEROL 0.5-2.5 (3) MG/3ML IN SOLN: 3.0000 mL | RESPIRATORY_TRACT | Status: DC

## 2014-12-31 MED ORDER — ALBUTEROL SULFATE (2.5 MG/3ML) 0.083% IN NEBU
2.5000 mg | INHALATION_SOLUTION | RESPIRATORY_TRACT | Status: DC | PRN
  Administered 2014-12-31: 2.5 mg via RESPIRATORY_TRACT
  Filled 2014-12-31: qty 3

## 2014-12-31 MED ORDER — ALBUTEROL SULFATE HFA 108 (90 BASE) MCG/ACT IN AERS: 2.0000 | INHALATION_SPRAY | Freq: Four times a day (QID) | RESPIRATORY_TRACT | Status: DC

## 2014-12-31 MED ORDER — POTASSIUM CHLORIDE ER 20 MEQ PO TBCR: EXTENDED_RELEASE_TABLET | ORAL | Status: DC

## 2014-12-31 MED ORDER — PREDNISONE 10 MG PO TABS: ORAL_TABLET | ORAL | Status: DC

## 2014-12-31 MED ORDER — AZITHROMYCIN 250 MG PO TABS: ORAL_TABLET | ORAL | Status: DC

## 2014-12-31 MED ORDER — ALBUTEROL SULFATE HFA 108 (90 BASE) MCG/ACT IN AERS
2.0000 | INHALATION_SPRAY | Freq: Four times a day (QID) | RESPIRATORY_TRACT | Status: DC
  Administered 2014-12-31: 2 via RESPIRATORY_TRACT
  Filled 2014-12-31: qty 6.7

## 2014-12-31 MED ORDER — FUROSEMIDE 40 MG PO TABS: 40.0000 mg | ORAL_TABLET | Freq: Every day | ORAL | Status: DC

## 2014-12-31 MED ORDER — TAMSULOSIN HCL 0.4 MG PO CAPS: 0.4000 mg | ORAL_CAPSULE | Freq: Every day | ORAL | Status: DC

## 2014-12-31 NOTE — Discharge Summary (Signed)
Physician Discharge Summary  [name] [name] [name] [name]:[id number] DOB: 09/28/1946 DOA: 12/28/2014  PCP: No PCP Per Patient  Admit date: 12/28/2014 Discharge date: 12/31/2014  Time spent: Greater than 30  minutes  Recommendations for Outpatient Follow-up:  1.  Patient was instructed to follow-up with pulmonologist, Dr. Juanetta Gosling in 1 week and with his new primary care physician Dr. Delbert Harness as scheduled in April. 2. [facility] in referral was made.   Discharge Diagnoses:  1. Frequent hospitalizations for COPD exacerbation secondary to noncompliance secondary to poor social situation. 2. COPD exacerbation with 3 hospitalizations over the past 3 weeks for the same. 3. Chronic combined systolic and diastolic congestive heart failure, remained compensated. 4. History of aortic valve replacement with porcine valve 06/2014 at [facility]. 5. Thoracic ascending aortic aneurysm repair, 06/2014 at [facility]. 6. Sick sinus syndrome, status post PPM. 7. Bipolar disorder; anxiety. 8. Tobacco abuse. The patient advised to stop smoking. 9. Virtual homelessness as the patient sleeps in his car mostly and occasionally with acquaintances. 10. Noncompliance.  Discharge Condition: Stable and improved.  Diet recommendation: Heart healthy.  Filed Weights   12/28/14 1937 12/29/14 0015  Weight: 78.019 kg (172 lb) 78.064 kg (172 lb 1.6 oz)    History of present illness:  The patient is a 69 year old man with a history of bipolar disorder/chronic anxiety, chronic combined congestive heart failure, COPD, and history of aortic valve replacement with a porcine valve in September 2015, who presented once again to the ED with a chief complaint of shortness of breath. The patient had not gotten any physical prescriptions filled from the previous hospitalizations. In the ED, he was afebrile and hemodynamically stable. He was oxygenating 95% on supplemental oxygen. His proBNP was 131. His troponin I was negative. His WBC was 12.4. His chest x-ray  revealed mild vascular congestion and minimal bibasal atelectasis. He was admitted for further evaluation and management.  Hospital Course:   1. Recurrent COPD exacerbation. This is the patient's third hospitalization in 4 weeks. His frequent presentations to the ED is secondary to noncompliance because of insurance issues with his medications and also in part homelessness.-He lives in his truck most of the time. He was started on treatment with azithromycin, Solu-Medrol, DuoNeb nebulizers, and oxygen as needed. Pulmonologist, Dr. Juanetta Gosling was consulted and provided management recommendations. He improved clinically and symptomatically. His bronchospasms and shortness of breath resolved. He was oxygenating in the mid 90s on room air prior to discharge. He was discharged on a prednisone taper, azithromycin, albuterol inhaler (given to him prior to discharge) and prescriptions for DuoNeb. Dr. Juanetta Gosling offered to follow him for his COPD. The patient was informed of this and was instructed to call Dr. Juanetta Gosling' office for follow-up appointment. He voiced understanding.   Leukocytosis and hyperglycemia, secondary to steroid treatment. He was treated with sliding scale NovoLog. Supportive treatment was given. He remained afebrile during the hospitalization.  Tobacco abuse. The patient was advised to stop smoking.  Chronic combined systolic and diastolic congestive heart failure/status post aortic valve replacement September 2015/history of aortic valve replacement/thoracic aortic aneurysm repair. There was no evidence of decompensation during hospitalization, although, he did have some bilateral ankle edema which was mild. His was treated with IV Lasix 1. He was restarted on oral Lasix.  Bipolar disorder/chronic anxiety. The patient had been prescribed gabapentin in November 2015 by behavioral health but he had not been taking it. When I asked him if he needed a prescription, he stated no because he  refuses to take  it. It was given during the hospitalization. The patient's speech was pressured at times, but there was no evidence of psychosis during hospitalization.   Procedures:  None  Consultations:  None  Discharge Exam: Filed Vitals:   12/31/14 0552  BP: 131/84  Pulse: 89  Temp: 97.5 F (36.4 C)  Resp: 20    General: Alert talkative 69 year old man sitting up in bed, in no acute distress.  Cardiovascular: S1, S2, with a soft systolic murmur and S2 click  Respiratory: Clear with no significant or audible wheezes.. Breathing nonlabored at rest.  Abdomen: Positive bowel sounds, soft, nontender, nondistended.  Musculoskeletal: Lower extremities bilaterally with trace of pedal edema; varicose veins.  Neurologic/psychiatric: He is alert and oriented 3. His speech is clear, not pressured. Cranial nerves grossly intact.  Discharge Instructions   Discharge Instructions    Diet - low sodium heart healthy    Complete by:  As directed      Increase activity slowly    Complete by:  As directed           Current Discharge Medication List    START taking these medications   Details  azithromycin (ZITHROMAX) 250 MG tablet Take 1 tablet daily for 4 more days. Qty: 6 each, Refills: 0    potassium chloride 20 MEQ TBCR Take with Lasix. Qty: 30 tablet, Refills: 3      CONTINUE these medications which have CHANGED   Details  albuterol (PROVENTIL HFA;VENTOLIN HFA) 108 (90 BASE) MCG/ACT inhaler Inhale 2 puffs into the lungs every 6 (six) hours. Qty: 1 Inhaler, Refills: 3    furosemide (LASIX) 40 MG tablet Take 1 tablet (40 mg total) by mouth daily. Qty: 30 tablet, Refills: 3    ipratropium-albuterol (DUONEB) 0.5-2.5 (3) MG/3ML SOLN Take 3 mLs by nebulization every 4 (four) hours as needed. Qty: 24 mL, Refills: 3    predniSONE (DELTASONE) 10 MG tablet Starting tomorrow, take 6 tablets daily for 1 day; then 5 tablets the next day for 1 day; then for 4 tablets the  next day for 1 day; then 3 tablets the next day for 1 day; then 2 tablets the next day for 1 day; then one tablet the next day for 1 day; then stop. Qty: 21 tablet, Refills: 0    tamsulosin (FLOMAX) 0.4 MG CAPS capsule Take 1 capsule (0.4 mg total) by mouth daily after supper. Qty: 30 capsule, Refills: 3      CONTINUE these medications which have NOT CHANGED   Details  aspirin EC 81 MG tablet Take 81 mg by mouth daily.    folic acid (FOLVITE) 1 MG tablet Take 1 tablet (1 mg total) by mouth daily. Qty: 30 tablet, Refills: 0    LEVITRA 10 MG tablet Take 10 mg by mouth as needed for erectile dysfunction.  Refills: 0      STOP taking these medications     gabapentin (NEURONTIN) 400 MG capsule      levofloxacin (LEVAQUIN) 500 MG tablet        Allergies  Allergen Reactions  . Tiotropium Bromide Monohydrate Nausea Only and Anaphylaxis    Tremors  . Morphine And Related Other (See Comments)    Hallucinations   Follow-up Information    Follow up with HAWKINS,EDWARD L, MD In 1 week.   Specialty:  Pulmonary Disease   Why:  Office will call with appointment for COPD follow up   Contact information:   [address] [place] [address]  [phone]       Follow up with Isabella Stalling, MD On 02/06/2015.   Specialty:  Internal Medicine   Why:  at 9:00. Your new primary care doctor.   Contact information:   [address] [place] [address] [phone]       Follow up with Anner Crete, MD.   Specialty:  Urology   Why:  Call for an appointment regarding your prostate   Contact information:   [address] [place] [address] [phone]        The results of significant diagnostics from this hospitalization (including imaging, microbiology, ancillary and laboratory) are listed below for reference.    Significant Diagnostic Studies: Dg Chest 2 View  12/15/2014   CLINICAL DATA:  Shortness of breath  EXAM: CHEST  2 VIEW   COMPARISON:  12/05/2014  FINDINGS: Stable heart size and aortic contours post aortic valve replacement. Stable positioning of dual-chamber pacer leads from the left. There is chronic pulmonary hyperinflation and bronchial wall thickening. There is no edema, consolidation, effusion, or pneumothorax. Left nipple shadow is noted. Status post surgery to the right chest; remote right-sided rib fractures.  IMPRESSION: COPD without acute superimposed finding.   Electronically Signed   By: Marnee Spring M.D.   On: 12/15/2014 16:07   Dg Chest 2 View  12/05/2014   CLINICAL DATA:  Shortness of breath for 2-3 days.  COPD.  EXAM: CHEST  2 VIEW  COMPARISON:  Single view of the chest 09/01/2014.  FINDINGS: Surgical clips projecting over the right upper chest are again seen. The patient is status post aortic valve replacement with a pacing device in place. The chest is hyperexpanded with attenuation of the pulmonary vasculature but the lungs are clear. Heart size is normal. No pneumothorax or pleural effusion. Remote healed right rib fractures are noted.  IMPRESSION: Emphysema postoperative change without acute disease.   Electronically Signed   By: Drusilla Kanner M.D.   On: 12/05/2014 13:20   Dg Chest Port 1 View  12/28/2014   CLINICAL DATA:  Acute onset of shortness of breath and chest tightness. Initial encounter.  EXAM: PORTABLE CHEST - 1 VIEW  COMPARISON:  Chest radiograph performed 12/15/2014  FINDINGS: The lungs are well-aerated. Mild vascular congestion is noted. Minimal bibasilar atelectasis is seen. There is no evidence of pleural effusion or pneumothorax.  The cardiomediastinal silhouette is borderline normal in size. The patient is status post median sternotomy. A pacemaker is noted overlying the left chest wall, with leads ending overlying the right atrium and right ventricle. No acute osseous abnormalities are seen. Clips are seen overlying the right lung apex.  IMPRESSION: Mild vascular congestion noted.  Minimal bibasilar atelectasis seen. Lungs otherwise grossly clear.   Electronically Signed   By: Roanna Raider M.D.   On: 12/28/2014 20:34    Microbiology: No results found for this or any previous visit (from the past 240 hour(s)).   Labs: Basic Metabolic Panel:  Recent Labs Lab 12/28/14 2025 12/30/14 0617  NA 143 141  K 3.7 4.6  CL 107 104  CO2 29 30  GLUCOSE 112* 150*  BUN 12 20  CREATININE 0.83 0.85  CALCIUM 9.0 9.4   Liver Function Tests: No results for input(s): AST, ALT, ALKPHOS, BILITOT, PROT, ALBUMIN in the last 168 hours. No results for input(s): LIPASE, AMYLASE in the last 168 hours. No results for input(s): AMMONIA in the last 168 hours. CBC:  Recent Labs Lab 12/28/14 2025 12/30/14 0617  WBC 12.4* 18.4*  NEUTROABS 8.9*  --   HGB 13.6 13.9  HCT 41.5 42.6  MCV 94.3 94.9  PLT 218 252   Cardiac Enzymes:  Recent Labs Lab 12/28/14 2025  TROPONINI <0.03   BNP: BNP (last 3 results)  Recent Labs  12/05/14 2056 12/15/14 1604 12/28/14 2025  BNP 51.0 58.0 131.0*    ProBNP (last 3 results)  Recent Labs  09/01/14 1722 09/04/14 2121 09/24/14 0704  PROBNP 653.8* 333.0* 798.5*    CBG:  Recent Labs Lab 12/29/14 2041 12/30/14 0805 12/30/14 1128 12/30/14 1717 12/30/14 2123  GLUCAP 155* 124* 139* 168* 128*       Signed:  Asna Muldrow  [facility] 12/31/2014, 12:08 PM

## 2014-12-31 NOTE — Discharge Planning (Addendum)
Client iv removed and ready to go home.  Client has not had any pain meds for at least 24 hrs - so was told he could drive himself.  VSS, no pain indicated.  Client given DC papers and explained. Client told of FU appts needed and all medications reviewed. Scripts given and Albuterol inhaler, per orders. Client wheeled to car by PCT.

## 2015-01-02 LAB — GLUCOSE, CAPILLARY: Glucose-Capillary: 191 mg/dL — ABNORMAL HIGH (ref 70–99)

## 2015-01-05 ENCOUNTER — Emergency Department (HOSPITAL_COMMUNITY)
Admission: EM | Admit: 2015-01-05 | Discharge: 2015-01-05 | Disposition: A | Discharge status: Home / Self Care | Attending: Emergency Medicine | Admitting: Emergency Medicine

## 2015-01-05 ENCOUNTER — Emergency Department (HOSPITAL_COMMUNITY)

## 2015-01-05 ENCOUNTER — Encounter (HOSPITAL_COMMUNITY): Payer: Self-pay

## 2015-01-05 DIAGNOSIS — I2510 Atherosclerotic heart disease of native coronary artery without angina pectoris: Secondary | ICD-10-CM | POA: Insufficient documentation

## 2015-01-05 DIAGNOSIS — J441 Chronic obstructive pulmonary disease with (acute) exacerbation: Secondary | ICD-10-CM | POA: Insufficient documentation

## 2015-01-05 DIAGNOSIS — I509 Heart failure, unspecified: Secondary | ICD-10-CM | POA: Diagnosis not present

## 2015-01-05 DIAGNOSIS — Z590 Homelessness: Secondary | ICD-10-CM | POA: Insufficient documentation

## 2015-01-05 DIAGNOSIS — Z79899 Other long term (current) drug therapy: Secondary | ICD-10-CM | POA: Diagnosis not present

## 2015-01-05 DIAGNOSIS — Z8659 Personal history of other mental and behavioral disorders: Secondary | ICD-10-CM | POA: Insufficient documentation

## 2015-01-05 DIAGNOSIS — Z950 Presence of cardiac pacemaker: Secondary | ICD-10-CM | POA: Diagnosis not present

## 2015-01-05 DIAGNOSIS — Z720 Tobacco use: Secondary | ICD-10-CM | POA: Diagnosis not present

## 2015-01-05 DIAGNOSIS — R0602 Shortness of breath: Secondary | ICD-10-CM | POA: Diagnosis present

## 2015-01-05 DIAGNOSIS — Z7982 Long term (current) use of aspirin: Secondary | ICD-10-CM | POA: Diagnosis not present

## 2015-01-05 DIAGNOSIS — Z792 Long term (current) use of antibiotics: Secondary | ICD-10-CM | POA: Diagnosis not present

## 2015-01-05 LAB — CBC WITH DIFFERENTIAL/PLATELET
Basophils Absolute: 0 10*3/uL (ref 0.0–0.1)
Basophils Relative: 0 % (ref 0–1)
Eosinophils Absolute: 0.1 10*3/uL (ref 0.0–0.7)
Eosinophils Relative: 0 % (ref 0–5)
HCT: 47.8 % (ref 39.0–52.0)
Hemoglobin: 15.2 g/dL (ref 13.0–17.0)
Lymphocytes Relative: 9 % — ABNORMAL LOW (ref 12–46)
Lymphs Abs: 2.1 10*3/uL (ref 0.7–4.0)
MCH: 30.5 pg (ref 26.0–34.0)
MCHC: 31.8 g/dL (ref 30.0–36.0)
MCV: 95.8 fL (ref 78.0–100.0)
Monocytes Absolute: 1.7 10*3/uL — ABNORMAL HIGH (ref 0.1–1.0)
Monocytes Relative: 7 % (ref 3–12)
Neutro Abs: 19.9 10*3/uL — ABNORMAL HIGH (ref 1.7–7.7)
Neutrophils Relative %: 84 % — ABNORMAL HIGH (ref 43–77)
Platelets: 194 10*3/uL (ref 150–400)
RBC: 4.99 MIL/uL (ref 4.22–5.81)
RDW: 16.3 % — ABNORMAL HIGH (ref 11.5–15.5)
WBC: 23.8 10*3/uL — ABNORMAL HIGH (ref 4.0–10.5)

## 2015-01-05 LAB — COMPREHENSIVE METABOLIC PANEL
ALT: 27 U/L (ref 0–53)
AST: 23 U/L (ref 0–37)
Albumin: 4 g/dL (ref 3.5–5.2)
Alkaline Phosphatase: 65 U/L (ref 39–117)
Anion gap: 8 (ref 5–15)
BUN: 30 mg/dL — ABNORMAL HIGH (ref 6–23)
CO2: 33 mmol/L — ABNORMAL HIGH (ref 19–32)
Calcium: 9.4 mg/dL (ref 8.4–10.5)
Chloride: 102 mmol/L (ref 96–112)
Creatinine, Ser: 1.02 mg/dL (ref 0.50–1.35)
GFR calc Af Amer: 85 mL/min — ABNORMAL LOW (ref >90)
GFR calc non Af Amer: 73 mL/min — ABNORMAL LOW (ref >90)
Glucose, Bld: 127 mg/dL — ABNORMAL HIGH (ref 70–99)
Potassium: 4.2 mmol/L (ref 3.5–5.1)
Sodium: 143 mmol/L (ref 135–145)
Total Bilirubin: 0.8 mg/dL (ref 0.3–1.2)
Total Protein: 6.6 g/dL (ref 6.0–8.3)

## 2015-01-05 LAB — TROPONIN I: Troponin I: <0.03 ng/mL (ref <0.031)

## 2015-01-05 LAB — BRAIN NATRIURETIC PEPTIDE: B Natriuretic Peptide: 94 pg/mL (ref 0.0–100.0)

## 2015-01-05 MED ORDER — FUROSEMIDE 40 MG PO TABS: 40.0000 mg | ORAL_TABLET | Freq: Every day | ORAL | Status: DC

## 2015-01-05 MED ORDER — IPRATROPIUM-ALBUTEROL 0.5-2.5 (3) MG/3ML IN SOLN
3.0000 mL | Freq: Once | RESPIRATORY_TRACT | Status: AC
  Administered 2015-01-05: 3 mL via RESPIRATORY_TRACT
  Filled 2015-01-05: qty 3

## 2015-01-05 MED ORDER — FUROSEMIDE 10 MG/ML IJ SOLN
40.0000 mg | Freq: Once | INTRAMUSCULAR | Status: AC
  Administered 2015-01-05: 40 mg via INTRAVENOUS
  Filled 2015-01-05: qty 4

## 2015-01-05 MED ORDER — ALBUTEROL SULFATE (2.5 MG/3ML) 0.083% IN NEBU
2.5000 mg | INHALATION_SOLUTION | Freq: Once | RESPIRATORY_TRACT | Status: AC
  Administered 2015-01-05: 2.5 mg via RESPIRATORY_TRACT
  Filled 2015-01-05: qty 3

## 2015-01-05 MED ORDER — METHYLPREDNISOLONE SODIUM SUCC 125 MG IJ SOLR
125.0000 mg | Freq: Once | INTRAMUSCULAR | Status: AC
  Administered 2015-01-05: 125 mg via INTRAVENOUS
  Filled 2015-01-05: qty 2

## 2015-01-05 MED ORDER — METHYLPREDNISOLONE SODIUM SUCC 125 MG IJ SOLR: 125.0000 mg | Freq: Once | INTRAMUSCULAR | Status: DC

## 2015-01-05 MED ORDER — PREDNISONE 20 MG PO TABS: ORAL_TABLET | ORAL | Status: DC

## 2015-01-05 MED ORDER — LEVOFLOXACIN 500 MG PO TABS: 500.0000 mg | ORAL_TABLET | Freq: Every day | ORAL | Status: DC

## 2015-01-05 NOTE — ED Notes (Signed)
[facility] orders discussed with nurse.

## 2015-01-05 NOTE — ED Notes (Signed)
MD at bedside. 

## 2015-01-05 NOTE — ED Notes (Signed)
Police officers at bedside to carry patient to facility.

## 2015-01-05 NOTE — ED Notes (Signed)
Pt refused discharge VS and Temp.
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
## 2015-01-05 NOTE — Discharge Instructions (Signed)
Only get these prescriptions filled if you are not going to take the other prescriptions written the other day for you
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
## 2015-03-06 ENCOUNTER — Other Ambulatory Visit (HOSPITAL_COMMUNITY): Payer: Self-pay

## 2015-03-06 ENCOUNTER — Inpatient Hospital Stay (HOSPITAL_COMMUNITY)
Admission: EM | Admit: 2015-03-06 | Discharge: 2015-03-08 | DRG: 190 | Disposition: A | Discharge status: Home / Self Care | Payer: Medicare HMO | Attending: Internal Medicine | Admitting: Internal Medicine

## 2015-03-06 ENCOUNTER — Encounter (HOSPITAL_COMMUNITY): Payer: Self-pay | Admitting: Cardiology

## 2015-03-06 ENCOUNTER — Emergency Department (HOSPITAL_COMMUNITY): Payer: Medicare HMO

## 2015-03-06 DIAGNOSIS — Z833 Family history of diabetes mellitus: Secondary | ICD-10-CM | POA: Diagnosis not present

## 2015-03-06 DIAGNOSIS — F319 Bipolar disorder, unspecified: Secondary | ICD-10-CM | POA: Diagnosis present

## 2015-03-06 DIAGNOSIS — N400 Enlarged prostate without lower urinary tract symptoms: Secondary | ICD-10-CM | POA: Diagnosis present

## 2015-03-06 DIAGNOSIS — J9601 Acute respiratory failure with hypoxia: Secondary | ICD-10-CM | POA: Diagnosis present

## 2015-03-06 DIAGNOSIS — J441 Chronic obstructive pulmonary disease with (acute) exacerbation: Secondary | ICD-10-CM | POA: Diagnosis present

## 2015-03-06 DIAGNOSIS — Z954 Presence of other heart-valve replacement: Secondary | ICD-10-CM | POA: Diagnosis not present

## 2015-03-06 DIAGNOSIS — J181 Lobar pneumonia, unspecified organism: Secondary | ICD-10-CM | POA: Diagnosis present

## 2015-03-06 DIAGNOSIS — Z952 Presence of prosthetic heart valve: Secondary | ICD-10-CM

## 2015-03-06 DIAGNOSIS — Z809 Family history of malignant neoplasm, unspecified: Secondary | ICD-10-CM | POA: Diagnosis not present

## 2015-03-06 DIAGNOSIS — I5022 Chronic systolic (congestive) heart failure: Secondary | ICD-10-CM | POA: Diagnosis present

## 2015-03-06 DIAGNOSIS — Z950 Presence of cardiac pacemaker: Secondary | ICD-10-CM | POA: Diagnosis not present

## 2015-03-06 DIAGNOSIS — Z8249 Family history of ischemic heart disease and other diseases of the circulatory system: Secondary | ICD-10-CM

## 2015-03-06 DIAGNOSIS — Z9114 Patient's other noncompliance with medication regimen: Secondary | ICD-10-CM | POA: Diagnosis present

## 2015-03-06 DIAGNOSIS — Z951 Presence of aortocoronary bypass graft: Secondary | ICD-10-CM

## 2015-03-06 DIAGNOSIS — I2510 Atherosclerotic heart disease of native coronary artery without angina pectoris: Secondary | ICD-10-CM | POA: Diagnosis present

## 2015-03-06 DIAGNOSIS — Z720 Tobacco use: Secondary | ICD-10-CM | POA: Diagnosis not present

## 2015-03-06 DIAGNOSIS — F17210 Nicotine dependence, cigarettes, uncomplicated: Secondary | ICD-10-CM | POA: Diagnosis present

## 2015-03-06 DIAGNOSIS — J449 Chronic obstructive pulmonary disease, unspecified: Secondary | ICD-10-CM | POA: Diagnosis present

## 2015-03-06 LAB — TROPONIN I
Troponin I: <0.03 ng/mL (ref <0.031)
Troponin I: <0.03 ng/mL (ref <0.031)

## 2015-03-06 LAB — COMPREHENSIVE METABOLIC PANEL
ALT: 18 U/L (ref 17–63)
AST: 23 U/L (ref 15–41)
Albumin: 3.8 g/dL (ref 3.5–5.0)
Alkaline Phosphatase: 80 U/L (ref 38–126)
Anion gap: 9 (ref 5–15)
BUN: 8 mg/dL (ref 6–20)
CO2: 29 mmol/L (ref 22–32)
Calcium: 8.9 mg/dL (ref 8.9–10.3)
Chloride: 103 mmol/L (ref 101–111)
Creatinine, Ser: 0.82 mg/dL (ref 0.61–1.24)
GFR calc Af Amer: >60 mL/min (ref >60)
GFR calc non Af Amer: >60 mL/min (ref >60)
Glucose, Bld: 85 mg/dL (ref 70–99)
Potassium: 3.7 mmol/L (ref 3.5–5.1)
Sodium: 141 mmol/L (ref 135–145)
Total Bilirubin: 0.8 mg/dL (ref 0.3–1.2)
Total Protein: 6.5 g/dL (ref 6.5–8.1)

## 2015-03-06 LAB — CBC WITH DIFFERENTIAL/PLATELET
Basophils Absolute: 0 10*3/uL (ref 0.0–0.1)
Basophils Relative: 0 % (ref 0–1)
Eosinophils Absolute: 0.2 10*3/uL (ref 0.0–0.7)
Eosinophils Relative: 2 % (ref 0–5)
HCT: 44.2 % (ref 39.0–52.0)
Hemoglobin: 14.2 g/dL (ref 13.0–17.0)
Lymphocytes Relative: 20 % (ref 12–46)
Lymphs Abs: 2.3 10*3/uL (ref 0.7–4.0)
MCH: 31.5 pg (ref 26.0–34.0)
MCHC: 32.1 g/dL (ref 30.0–36.0)
MCV: 98 fL (ref 78.0–100.0)
Monocytes Absolute: 0.9 10*3/uL (ref 0.1–1.0)
Monocytes Relative: 7 % (ref 3–12)
Neutro Abs: 8.1 10*3/uL — ABNORMAL HIGH (ref 1.7–7.7)
Neutrophils Relative %: 71 % (ref 43–77)
Platelets: 240 10*3/uL (ref 150–400)
RBC: 4.51 MIL/uL (ref 4.22–5.81)
RDW: 15.8 % — ABNORMAL HIGH (ref 11.5–15.5)
WBC: 11.5 10*3/uL — ABNORMAL HIGH (ref 4.0–10.5)

## 2015-03-06 LAB — BRAIN NATRIURETIC PEPTIDE: B Natriuretic Peptide: 92 pg/mL (ref 0.0–100.0)

## 2015-03-06 MED ORDER — PREDNISONE 10 MG PO TABS
60.0000 mg | ORAL_TABLET | Freq: Once | ORAL | Status: AC
  Administered 2015-03-06: 60 mg via ORAL
  Filled 2015-03-06 (×2): qty 1

## 2015-03-06 MED ORDER — GUAIFENESIN ER 600 MG PO TB12
600.0000 mg | ORAL_TABLET | Freq: Two times a day (BID) | ORAL | Status: DC
  Administered 2015-03-06 – 2015-03-08 (×4): 600 mg via ORAL
  Filled 2015-03-06 (×4): qty 1

## 2015-03-06 MED ORDER — FUROSEMIDE 40 MG PO TABS
40.0000 mg | ORAL_TABLET | Freq: Every day | ORAL | Status: DC
  Administered 2015-03-07 – 2015-03-08 (×2): 40 mg via ORAL
  Filled 2015-03-06 (×2): qty 1

## 2015-03-06 MED ORDER — ONDANSETRON HCL 4 MG/2ML IJ SOLN: 4.0000 mg | Freq: Four times a day (QID) | INTRAMUSCULAR | Status: DC | PRN

## 2015-03-06 MED ORDER — ACETAMINOPHEN 650 MG RE SUPP: 650.0000 mg | Freq: Four times a day (QID) | RECTAL | Status: DC | PRN

## 2015-03-06 MED ORDER — SODIUM CHLORIDE 0.9 % IV SOLN: 250.0000 mL | INTRAVENOUS | Status: DC | PRN

## 2015-03-06 MED ORDER — SODIUM CHLORIDE 0.9 % IJ SOLN
3.0000 mL | Freq: Two times a day (BID) | INTRAMUSCULAR | Status: DC
  Administered 2015-03-06 – 2015-03-08 (×4): 3 mL via INTRAVENOUS

## 2015-03-06 MED ORDER — ALBUTEROL SULFATE (2.5 MG/3ML) 0.083% IN NEBU
2.5000 mg | INHALATION_SOLUTION | Freq: Once | RESPIRATORY_TRACT | Status: AC
  Administered 2015-03-06: 2.5 mg via RESPIRATORY_TRACT
  Filled 2015-03-06: qty 3

## 2015-03-06 MED ORDER — ENOXAPARIN SODIUM 40 MG/0.4ML ~~LOC~~ SOLN: 40.0000 mg | SUBCUTANEOUS | Status: DC

## 2015-03-06 MED ORDER — ASPIRIN EC 81 MG PO TBEC
81.0000 mg | DELAYED_RELEASE_TABLET | Freq: Every day | ORAL | Status: DC
  Administered 2015-03-07 – 2015-03-08 (×2): 81 mg via ORAL
  Filled 2015-03-06 (×2): qty 1

## 2015-03-06 MED ORDER — SODIUM CHLORIDE 0.9 % IJ SOLN
3.0000 mL | INTRAMUSCULAR | Status: DC | PRN
  Administered 2015-03-07: 3 mL via INTRAVENOUS
  Filled 2015-03-06 (×2): qty 3

## 2015-03-06 MED ORDER — ALBUTEROL SULFATE (2.5 MG/3ML) 0.083% IN NEBU: 5.0000 mg | INHALATION_SOLUTION | Freq: Once | RESPIRATORY_TRACT | Status: DC

## 2015-03-06 MED ORDER — IPRATROPIUM-ALBUTEROL 0.5-2.5 (3) MG/3ML IN SOLN
3.0000 mL | RESPIRATORY_TRACT | Status: DC
  Administered 2015-03-06 – 2015-03-08 (×11): 3 mL via RESPIRATORY_TRACT
  Filled 2015-03-06 (×11): qty 3

## 2015-03-06 MED ORDER — IPRATROPIUM BROMIDE 0.02 % IN SOLN: 0.5000 mg | Freq: Once | RESPIRATORY_TRACT | Status: DC

## 2015-03-06 MED ORDER — ACETAMINOPHEN 325 MG PO TABS: 650.0000 mg | ORAL_TABLET | Freq: Four times a day (QID) | ORAL | Status: DC | PRN

## 2015-03-06 MED ORDER — ONDANSETRON HCL 4 MG PO TABS: 4.0000 mg | ORAL_TABLET | Freq: Four times a day (QID) | ORAL | Status: DC | PRN

## 2015-03-06 MED ORDER — LEVOFLOXACIN IN D5W 500 MG/100ML IV SOLN
500.0000 mg | INTRAVENOUS | Status: DC
  Administered 2015-03-06 – 2015-03-07 (×2): 500 mg via INTRAVENOUS
  Filled 2015-03-06 (×2): qty 100

## 2015-03-06 MED ORDER — TAMSULOSIN HCL 0.4 MG PO CAPS
0.4000 mg | ORAL_CAPSULE | Freq: Every day | ORAL | Status: DC
  Administered 2015-03-06 – 2015-03-07 (×2): 0.4 mg via ORAL
  Filled 2015-03-06 (×2): qty 1

## 2015-03-06 MED ORDER — FOLIC ACID 1 MG PO TABS
1.0000 mg | ORAL_TABLET | Freq: Every day | ORAL | Status: DC
  Administered 2015-03-07 – 2015-03-08 (×2): 1 mg via ORAL
  Filled 2015-03-06 (×2): qty 1

## 2015-03-06 MED ORDER — METHYLPREDNISOLONE SODIUM SUCC 125 MG IJ SOLR
60.0000 mg | Freq: Four times a day (QID) | INTRAMUSCULAR | Status: DC
  Administered 2015-03-06 – 2015-03-07 (×3): 60 mg via INTRAVENOUS
  Filled 2015-03-06 (×3): qty 2

## 2015-03-06 NOTE — ED Notes (Signed)
PT with dyspnea and <o2 sats of 84% while ambulating. MD made aware.
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
## 2015-03-06 NOTE — ED Notes (Addendum)
Pt states being SOB X 2 days. Pt has pacemaker, COPD, open heart surgery 27 weeks ago. Pt currently on prednisone and cannot find meds at this time. Pt states it is hard to breathe and feels a lot of pressure in chest.  When doing ekg in triage,  Pt was in bigeminy

## 2015-03-06 NOTE — H&P (Signed)
PCP:   MILLER,GARY, MD   Chief Complaint:  Shortness of breath  HPI:  69 year old male who  has a past medical history of Severe aortic valve stenosis; COPD (chronic obstructive pulmonary disease); Thoracic ascending aortic aneurysm; COPD (chronic obstructive pulmonary disease); BPH (benign prostatic hyperplasia); Pacemaker; SSS (sick sinus syndrome); CAD (coronary artery disease); Pneumothorax; Anxiety and depression; Psychosis; Bipolar disorder; CHF (congestive heart failure); Neuropathic pain; Homelessness; and Tobacco abuse. Today came to the hospital with chief him. Of shortness of breath for past few days. Patient has been off his prednisone for past 2 days. Patient says that he also ran out of his nebulizers solution. Patient also comments of cough with white phlegm. He also admits to having chest pain which is reproducible on the right upper chest wall. Patient does have a history of CAD status post CABG, status post pacemaker placement for sick sinus syndrome, status post aortic valve replacement] (porcine valve) In the ED on ambulation he developed dyspnea with O2 sats dropped to 84% He denies nausea vomiting or diarrhea. No fever no dysuria urgency frequency of urination. Denies fever  Allergies:   Allergies  Allergen Reactions  . Tiotropium Bromide Monohydrate Nausea Only and Anaphylaxis    Tremors  . Morphine And Related Other (See Comments)    Hallucinations      Past Medical History  Diagnosis Date  . Severe aortic valve stenosis     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 at [facility]  . COPD (chronic obstructive pulmonary disease)   . Thoracic ascending aortic aneurysm     repaired 06/2014 at [facility]  . COPD (chronic obstructive pulmonary disease)   . BPH (benign prostatic hyperplasia)   . Pacemaker     battery replaced 06/2014 at [facility]  . SSS (sick sinus syndrome)     s/p PPM   . CAD (coronary artery disease)     ?MI in 2007  . Pneumothorax     traumatic 2007  .  Anxiety and depression   . Psychosis   . Bipolar disorder   . CHF (congestive heart failure)   . Neuropathic pain   . Homelessness   . Tobacco abuse     Past Surgical History  Procedure Laterality Date  . Pacemaker placement  2007    battery replaced 07/04/2014  . Hiatal hernia repair    . Knee surgery      L5-6  . Lumbar fusion    . Aortic valve replacement      porcine valve  . Cardiac surgery      ascending thoracic aneurysm repair 06/2014    Prior to Admission medications   Medication Sig Start Date End Date Taking? Authorizing Provider  aspirin EC 81 MG tablet Take 81 mg by mouth daily.   Yes Historical Provider, MD  folic acid (FOLVITE) 1 MG tablet Take 1 tablet (1 mg total) by mouth daily. 10/04/14  Yes Richarda Overlie, MD  furosemide (LASIX) 40 MG tablet Take 1 tablet (40 mg total) by mouth daily. 01/05/15  Yes Bethann Berkshire, MD  ipratropium-albuterol (DUONEB) 0.5-2.5 (3) MG/3ML SOLN Take 3 mLs by nebulization every 4 (four) hours as needed. 12/31/14  Yes Elliot Cousin, MD  LEVITRA 10 MG tablet Take 10 mg by mouth as needed for erectile dysfunction.  12/15/14  Yes Historical Provider, MD  potassium chloride 20 MEQ TBCR Take with Lasix. Patient taking differently: Take 1 tablet by mouth daily. Take with Lasix. 12/31/14  Yes Elliot Cousin, MD  tamsulosin [facility]) 0.4  MG CAPS capsule Take 1 capsule (0.4 mg total) by mouth daily after supper. 12/31/14  Yes Elliot Cousinenise Fisher, MD  albuterol (PROVENTIL HFA;VENTOLIN HFA) 108 (90 BASE) MCG/ACT inhaler Inhale 2 puffs into the lungs every 6 (six) hours. 12/31/14   Elliot Cousinenise Fisher, MD  azithromycin (ZITHROMAX) 250 MG tablet Take 1 tablet daily for 4 more days. Patient not taking: Reported on 03/06/2015 12/31/14   Elliot Cousinenise Fisher, MD  levofloxacin (LEVAQUIN) 500 MG tablet Take 1 tablet (500 mg total) by mouth daily. Patient not taking: Reported on 03/06/2015 01/05/15   Bethann BerkshireJoseph Zammit, MD  predniSONE (DELTASONE) 20 MG tablet 2 tabs po daily x 3 days Patient not  taking: Reported on 03/06/2015 01/05/15   Bethann BerkshireJoseph Zammit, MD    Social History:  reports that he has been smoking Cigarettes.  He has been smoking about 1.00 pack per day. He does not have any smokeless tobacco history on file. He reports that he drinks alcohol. He reports that he uses illicit drugs (Marijuana).  Family History  Problem Relation Age of Onset  . CAD Father   . Diabetes Father   . High blood pressure Brother   . Cancer Brother      All the positives are listed in BOLD  Review of Systems:  HEENT: Headache, blurred vision, runny nose, sore throat Neck: Hypothyroidism, hyperthyroidism,,lymphadenopathy Chest : Shortness of breath, history of COPD, Asthma Heart : Chest pain, history of coronary arterey disease GI:  Nausea, vomiting, diarrhea, constipation, GERD GU: Dysuria, urgency, frequency of urination, hematuria Neuro: Stroke, seizures, syncope Psych: Depression, anxiety, hallucinations   Physical Exam: Blood pressure 142/88, pulse 80, temperature 98.4 F (36.9 C), temperature source Oral, resp. rate 17, height 5\' 11"  (1.803 m), weight 77.111 kg (170 lb), SpO2 98 %. Constitutional:   Patient is a well-developed and well-nourished male* in no acute distress and cooperative with exam. Head: Normocephalic and atraumatic Mouth: Mucus membranes moist Eyes: PERRL, EOMI, conjunctivae normal Neck: Supple, No Thyromegaly Cardiovascular: RRR, S1 normal, S2 normal Pulmonary/Chest: Decreased breath sounds bilaterally, reproducible chest pain noted in the costochondral junction of third and fourth rib on the right Abdominal: Soft. Non-tender, non-distended, bowel sounds are normal, no masses, organomegaly, or guarding present.  Neurological: A&O x3, Strength is normal and symmetric bilaterally, cranial nerve II-XII are grossly intact, no focal motor deficit, sensory intact to light touch bilaterally.  Extremities : No Cyanosis, Clubbing or Edema  Labs on Admission:  Basic  Metabolic Panel:  Recent Labs Lab 03/06/15 1657  NA 141  K 3.7  CL 103  CO2 29  GLUCOSE 85  BUN 8  CREATININE 0.82  CALCIUM 8.9   Liver Function Tests:  Recent Labs Lab 03/06/15 1657  AST 23  ALT 18  ALKPHOS 80  BILITOT 0.8  PROT 6.5  ALBUMIN 3.8   No results for input(s): LIPASE, AMYLASE in the last 168 hours. No results for input(s): AMMONIA in the last 168 hours. CBC:  Recent Labs Lab 03/06/15 1657  WBC 11.5*  NEUTROABS 8.1*  HGB 14.2  HCT 44.2  MCV 98.0  PLT 240   Cardiac Enzymes:  Recent Labs Lab 03/06/15 1657  TROPONINI <0.03    BNP (last 3 results)  Recent Labs  12/28/14 2025 01/05/15 0950 03/06/15 1657  BNP 131.0* 94.0 92.0    ProBNP (last 3 results)  Recent Labs  09/01/14 1722 09/04/14 2121 09/24/14 0704  PROBNP 653.8* 333.0* 798.5*    CBG: No results for input(s): GLUCAP in the last 168 hours.  Radiological Exams on Admission: Dg Chest 2 View  03/06/2015   CLINICAL DATA:  Increasing shortness of breath for the past 2 days. Mild central chest pain today. Smoker.  EXAM: CHEST  2 VIEW  COMPARISON:  01/05/2015.  FINDINGS: Normal sized heart. Clear lungs. The lungs are hyperexpanded. Stable right axillary surgical clips. Stable left subclavian pacemaker leads and battery pack. Minimal thoracic spine degenerative changes. Old, healed right rib fractures.  IMPRESSION: No acute abnormality.  Stable changes of COPD.   Electronically Signed   By: Beckie SaltsSteven  Reid M.D.   On: 03/06/2015 18:09     Assessment/Plan Active Problems:   COPD (chronic obstructive pulmonary disease)   BPH (benign prostatic hyperplasia)   COPD exacerbation   H/O aortic valve replacement   Tobacco abuse   Chronic systolic CHF (congestive heart failure)  COPD exacerbation Admit the patient to MedSurg floor, start DuoNeb nebulizers every 4 hours, solid Medrol 60 mg IV every 6 hours, Levaquin per pharmacy consultation, Mucinex 1 tablet by mouth a day.  Chest  pain Patient has reproducible chest pain but due to history of CAD will check cardiac enzymes. Will also obtain EKG.  Chronic systolic heart failure Continue Lasix 40 mg by mouth daily  DVT prophylaxis Lovenox  Code status: Full code  Family discussion: No family at bedside   Time Spent on Admission: 60 min  LAMA,GAGAN S [facility] Pager: [phone] 03/06/2015, 8:16 PM  If 7PM-7AM, please contact night-coverage  [email or web address]  Password [email or web address]

## 2015-03-06 NOTE — ED Provider Notes (Signed)
CSN: [id number]     Arrival date & time 03/06/15  1558 History   First MD Initiated Contact with Patient 03/06/15 1645     Chief Complaint  Patient presents with  . Shortness of Breath     (Consider location/radiation/quality/duration/timing/severity/associated sxs/prior Treatment) Patient is a 69 y.o. male presenting with shortness of breath. The history is provided by the patient.  Shortness of Breath Severity:  Moderate Associated symptoms: no abdominal pain, no chest pain, no headaches, no rash and no vomiting    patient was shortness of breath over the last few days. States he's been off his prednisone for 2 days. States he has not had his breathing treatment either. States he mostly lives in his truck and he got that impounded because he was trying to help a lady out that got the truck stuck in the mud while she was trying to go buy crack. States he has some chest pain with it. He has had some cough. Patient states all he needed some steroids and breathing treatment a place to stay here for a couple days.  Past Medical History  Diagnosis Date  . Severe aortic valve stenosis     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  . COPD (chronic obstructive pulmonary disease)   . Thoracic ascending aortic aneurysm     [facility]  . COPD (chronic obstructive pulmonary disease)   . BPH (benign prostatic hyperplasia)   . Pacemaker     [facility]  . SSS (sick sinus syndrome)     s/p PPM   . CAD (coronary artery disease)     ?MI in 2007  . Pneumothorax     traumatic 2007  . Anxiety and depression   . Psychosis   . Bipolar disorder   . CHF (congestive heart failure)   . Neuropathic pain   . Homelessness   . Tobacco abuse    Past Surgical History  Procedure Laterality Date  . Pacemaker placement  2007    battery replaced 07/04/2014  . Hiatal hernia repair    . Knee surgery      L5-6  . Lumbar fusion    . Aortic valve replacement     porcine valve  . Cardiac surgery      ascending thoracic aneurysm repair 06/2014   Family History  Problem Relation Age of Onset  . CAD Father   . Diabetes Father   . High blood pressure Brother   . Cancer Brother    History  Substance Use Topics  . Smoking status: Current Some Day Smoker -- 1.00 packs/day    Types: Cigarettes  . Smokeless tobacco: Not on file  . Alcohol Use: Yes     Comment: occ    Review of Systems  Constitutional: Negative for activity change and appetite change.  Eyes: Negative for pain.  Respiratory: Positive for shortness of breath. Negative for chest tightness.   Cardiovascular: Negative for chest pain and leg swelling.  Gastrointestinal: Negative for nausea, vomiting, abdominal pain and diarrhea.  Genitourinary: Negative for flank pain.  Musculoskeletal: Negative for back pain and neck stiffness.  Skin: Negative for rash.  Neurological: Negative for weakness, numbness and headaches.  Psychiatric/Behavioral: Negative for behavioral problems.      Allergies  Tiotropium bromide monohydrate and Morphine and related  Home Medications   Prior to Admission medications   Medication Sig Start Date End Date Taking? Authorizing Provider  aspirin EC 81 MG tablet Take 81  mg by mouth daily.   Yes Historical Provider, MD  folic acid (FOLVITE) 1 MG tablet Take 1 tablet (1 mg total) by mouth daily. 10/04/14  Yes Richarda Overlie, MD  furosemide (LASIX) 40 MG tablet Take 1 tablet (40 mg total) by mouth daily. 01/05/15  Yes Bethann Berkshire, MD  ipratropium-albuterol (DUONEB) 0.5-2.5 (3) MG/3ML SOLN Take 3 mLs by nebulization every 4 (four) hours as needed. 12/31/14  Yes Elliot Cousin, MD  LEVITRA 10 MG tablet Take 10 mg by mouth as needed for erectile dysfunction.  12/15/14  Yes Historical Provider, MD  potassium chloride 20 MEQ TBCR Take with Lasix. Patient taking differently: Take 1 tablet by mouth daily. Take with Lasix. 12/31/14  Yes Elliot Cousin, MD  tamsulosin (FLOMAX)  0.4 MG CAPS capsule Take 1 capsule (0.4 mg total) by mouth daily after supper. 12/31/14  Yes Elliot Cousin, MD  albuterol (PROVENTIL HFA;VENTOLIN HFA) 108 (90 BASE) MCG/ACT inhaler Inhale 2 puffs into the lungs every 6 (six) hours. 12/31/14   Elliot Cousin, MD  azithromycin (ZITHROMAX) 250 MG tablet Take 1 tablet daily for 4 more days. Patient not taking: Reported on 03/06/2015 12/31/14   Elliot Cousin, MD  levofloxacin (LEVAQUIN) 500 MG tablet Take 1 tablet (500 mg total) by mouth daily. Patient not taking: Reported on 03/06/2015 01/05/15   Bethann Berkshire, MD  predniSONE (DELTASONE) 20 MG tablet 2 tabs po daily x 3 days Patient not taking: Reported on 03/06/2015 01/05/15   Bethann Berkshire, MD   BP 142/88 mmHg  Pulse 80  Temp(Src) 98.4 F (36.9 C) (Oral)  Resp 17  Ht  (1.803 m)  Wt 170 lb (77.111 kg)  BMI 23.72 kg/m2  SpO2 98% Physical Exam  Constitutional: He appears well-developed and well-nourished.  HENT:  Head: Atraumatic.  Neck: Neck supple.  Cardiovascular: Normal rate.   Pulmonary/Chest:  Diffuse wheezes and anterior chest tenderness. No respiratory distress but slightly pursed lip breathing.  Abdominal: Soft. There is no tenderness.  Musculoskeletal: Normal range of motion.  Neurological: He is alert.  Skin: Skin is warm.    ED Course  Procedures (including critical care time) Labs Review Labs Reviewed  CBC WITH DIFFERENTIAL/PLATELET - Abnormal; Notable for the following:    WBC 11.5 (*)    RDW 15.8 (*)    Neutro Abs 8.1 (*)    All other components within normal limits  COMPREHENSIVE METABOLIC PANEL  TROPONIN I  BRAIN NATRIURETIC PEPTIDE    Imaging Review Dg Chest 2 View  03/06/2015   CLINICAL DATA:  Increasing shortness of breath for the past 2 days. Mild central chest pain today. Smoker.  EXAM: CHEST  2 VIEW  COMPARISON:  01/05/2015.  FINDINGS: Normal sized heart. Clear lungs. The lungs are hyperexpanded. Stable right axillary surgical clips. Stable left subclavian  pacemaker leads and battery pack. Minimal thoracic spine degenerative changes. Old, healed right rib fractures.  IMPRESSION: No acute abnormality.  Stable changes of COPD.   Electronically Signed   By: Beckie Salts M.D.   On: 03/06/2015 18:09     EKG Interpretation None     03/06/2015   ED ECG REPORT   Date: 03/06/2015  Rate: 82  Rhythm: paced  QRS Axis: normal  Intervals: paced  ST/T Wave abnormalities: paced  Conduction Disutrbances:paced  Narrative Interpretation:   Old EKG Reviewed: unchanged   MDM   Final diagnoses:  COPD with acute exacerbation    Patient was shortness of breath. Has been noncompliant with his medicines. Some diffuse  wheezes. Desats into the 80s with ambulation. Will admit to internal medicine. Does not appear to be in CHF this time.    Benjiman CoreNathan Aladdin Kollmann, MD 03/06/15 1949

## 2015-03-07 DIAGNOSIS — J181 Lobar pneumonia, unspecified organism: Secondary | ICD-10-CM

## 2015-03-07 LAB — COMPREHENSIVE METABOLIC PANEL
ALT: 16 U/L — ABNORMAL LOW (ref 17–63)
AST: 18 U/L (ref 15–41)
Albumin: 3.4 g/dL — ABNORMAL LOW (ref 3.5–5.0)
Alkaline Phosphatase: 75 U/L (ref 38–126)
Anion gap: 7 (ref 5–15)
BUN: 15 mg/dL (ref 6–20)
CO2: 34 mmol/L — ABNORMAL HIGH (ref 22–32)
Calcium: 8.9 mg/dL (ref 8.9–10.3)
Chloride: 99 mmol/L — ABNORMAL LOW (ref 101–111)
Creatinine, Ser: 0.9 mg/dL (ref 0.61–1.24)
GFR calc Af Amer: >60 mL/min (ref >60)
GFR calc non Af Amer: >60 mL/min (ref >60)
Glucose, Bld: 196 mg/dL — ABNORMAL HIGH (ref 70–99)
Potassium: 4 mmol/L (ref 3.5–5.1)
Sodium: 140 mmol/L (ref 135–145)
Total Bilirubin: 0.4 mg/dL (ref 0.3–1.2)
Total Protein: 6.1 g/dL — ABNORMAL LOW (ref 6.5–8.1)

## 2015-03-07 LAB — CBC
HCT: 41.6 % (ref 39.0–52.0)
Hemoglobin: 13.7 g/dL (ref 13.0–17.0)
MCH: 32.1 pg (ref 26.0–34.0)
MCHC: 32.9 g/dL (ref 30.0–36.0)
MCV: 97.4 fL (ref 78.0–100.0)
Platelets: 218 10*3/uL (ref 150–400)
RBC: 4.27 MIL/uL (ref 4.22–5.81)
RDW: 15.2 % (ref 11.5–15.5)
WBC: 5 10*3/uL (ref 4.0–10.5)

## 2015-03-07 LAB — TROPONIN I
Troponin I: <0.03 ng/mL (ref <0.031)
Troponin I: <0.03 ng/mL (ref <0.031)

## 2015-03-07 MED ORDER — IPRATROPIUM-ALBUTEROL 0.5-2.5 (3) MG/3ML IN SOLN: 3.0000 mL | RESPIRATORY_TRACT | Status: DC | PRN

## 2015-03-07 MED ORDER — METHYLPREDNISOLONE SODIUM SUCC 125 MG IJ SOLR
60.0000 mg | Freq: Two times a day (BID) | INTRAMUSCULAR | Status: DC
  Administered 2015-03-07 – 2015-03-08 (×2): 60 mg via INTRAVENOUS
  Filled 2015-03-07 (×2): qty 2

## 2015-03-07 NOTE — Clinical Social Work Note (Signed)
Pt notified RN at 3:00 pm that he was due in court this morning and [place] clerk of court. CSW called and verified this information and clerk of court provided fax number. CSW faxed letter and provided original and fax confirmation page to pt. He is aware that CSW was told by clerk of court that it may be too late in the day for them to do anything as he was due in court at 8:30.  Derenda FennelKara Corban Kistler, LCSW [phone]

## 2015-03-07 NOTE — Care Management Note (Signed)
Case Management Note  Patient Details  Name: [name] [name] Brickey MRN: [id number] Date of Birth: 06/24/1946  Subjective/Objective:                  Pt admitted from home with COPD exacerbation. Pt lives alone in a hotel and states that he will return to the hotel at discharge. Pt has neb machine for prn use.  Action/Plan: No CM needs noted.  Expected Discharge Date:                  Expected Discharge Plan:  Home/Self Care  In-House Referral:  NA  Discharge planning Services  CM Consult  Post Acute Care Choice:  NA Choice offered to:  NA  DME Arranged:    DME Agency:     HH Arranged:    HH Agency:     Status of Service:  Completed, signed off  Medicare Important Message Given:    Date Medicare IM Given:    Medicare IM give by:    Date Additional Medicare IM Given:    Additional Medicare Important Message give by:     If discussed at Long Length of Stay Meetings, dates discussed:    Additional Comments:  Cheryl FlashBlackwell, Aixa Corsello Crowder, RN 03/07/2015, 12:35 PM

## 2015-03-07 NOTE — Progress Notes (Signed)
Inpatient Diabetes Program Recommendations  AACE/ADA: New Consensus Statement on Inpatient Glycemic Control (2013)  Target Ranges:  Prepandial:   less than 140 mg/dL      Peak postprandial:   less than 180 mg/dL (1-2 hours)      Critically ill patients:  140 - 180 mg/dL   Results for Jennings BooksBOSWELL, Garth H (MRN [id number]) as of 03/07/2015 09:42  Ref. Range 03/06/2015 16:57 03/07/2015 03:03  Glucose Latest Ref Range: 70-99 mg/dL 85 409196 (H)   Diabetes history: No Outpatient Diabetes medications: NA Current orders for Inpatient glycemic control: None  Inpatient Diabetes Program Recommendations Correction (SSI): Patient does not have a documented history of diabetes but lab glucose was 196 mg/dl at 8:113:03 this morning. Hyperglycemia likely related to steroids. While ordered steroids, please consider ordering CBGs with Novolog correction.  Thanks, Orlando PennerMarie Ollivander See, RN, MSN, CCRN, CDE Diabetes Coordinator Inpatient Diabetes Program [phone] (Team Pager from 8am to 5pm) [phone] ([facility] office) [phone] [facility] office)

## 2015-03-07 NOTE — Progress Notes (Signed)
I was made aware that pt was smoking in the room. I smelled smoke outside the door and it got stronger as  I entered the room. Pt was asked if he had been smoking and if he had cigarettes, but he denied both. Spoke with pt about calling security and he gave up the cigarettes. I have placed them in a biohazard bag with a pt sticker on it. I told the pt that it will be returned to him at discharge. Will continue to monitor.

## 2015-03-07 NOTE — Progress Notes (Signed)
Patient ID: [name] [name] Junious, male   DOB: 06/24/1946, 69 y.o.   MRN: [id number] [facility] PROGRESS NOTE  [name] [name] Carmon [id number] DOB: 11/13/1945 DOA: 03/06/2015 PCP: Daryel NovemberMILLER,GARY, MD  Brief narrative:    [age] year old male with past medical history of COPD, frequent admissions for COPD exacerbations, BPH, severe aortic valve stenosis, pacemaker who presented to [facility] ED with reports of worsening shortness of breath over past couple days prior to this admission. Patient ran out off nebulizer solution. Patient additionally reported having cough productive of white sputum.  In ED, patient was hypoxic with oxygen saturation of 88% on room air. He was afebrile. Blood work was essentially unremarkable. His troponin levels were within normal limits. Chest x-ray showed stable COPD changes. Patient was started on empiric Levaquin for possible pneumonia. He was also started on nebulizer treatments for COPD exacerbation.  Barrier to discharge: anticipate D.C 03/08/2015 if wheezing and shortness of breath improves.  Assessment/Plan:    Principal problem: Acute respiratory failure with hypoxia / acute COPD exacerbation - Initial hypoxia likely related to acute COPD exacerbation. - Per patient, respiratory status is better since the admission. He still has wheezing this morning. - Continue DuoNeb nebulizer treatment every 4 hours scheduled. Add as needed nebulizer treatment. - He was initially on Solu-Medrol 60 mg IV every 6 hours but we will reduce it to every 12 hours IV. Taper on discharge. - Continue oxygen support via nasal cannula to keep oxygen saturation above 90%.  Active Problems: Lobar pneumonia - Because of frequent COPD exacerbations patient was empirically started on Levaquin. - Blood cultures not obtained at the time of the admission.  BPH (benign prostatic hyperplasia) - Continue Flomax daily  Tobacco abuse - Counseled on cessation  Chronic systolic CHF (congestive heart  failure) - Compensated. Continue Lasix 40 mg daily and aspirin daily. - Last 2-D echo in December 2015 showed ejection fraction of 40-45%.  DVT Prophylaxis  - Lovenox sub Q ordered.   Code Status: Full.  Family Communication:  plan of care discussed with the patient Disposition Plan: Home 03/08/2015 if wheezing and shortness of breath improves.   IV access:  Peripheral IV  Procedures and diagnostic studies:    Dg Chest 2 View 03/06/2015   No acute abnormality.  Stable changes of COPD.     Medical Consultants:  None   Other Consultants:  None   IAnti-Infectives:   Levaquin 03/06/2015 -->    DEVINE, ALMA, MD  [facility] Pager [phone]  Time spent in minutes: 25 minutes  If 7PM-7AM, please contact night-coverage [email or web address] Password [email or web address] 03/07/2015, 10:43 AM   LOS: 1 day    HPI/Subjective: No acute overnight events. Patient reports wheezign and shortness of breath especially with exertion.   Objective: Filed Vitals:   03/07/15 0434 03/07/15 0453 03/07/15 0544 03/07/15 0707  BP:  105/63    Pulse:  90    Temp:  97.8 F (36.6 C)    TempSrc:  Oral    Resp:  20    Height:      Weight:  75.615 kg (166 lb 11.2 oz)    SpO2: 95% 96% 98% 95%    Intake/Output Summary (Last 24 hours) at 03/07/15 1043 Last data filed at 03/07/15 0931  Gross per 24 hour  Intake    486 ml  Output      0 ml  Net    486 ml    Exam:   General:  Pt is alert, follows commands appropriately, not in  acute distress  Cardiovascular: Regular rate and rhythm, S1/S2 (+), SEM appreciated   Respiratory: wheezing in upper and mid lung lobes, no crackles   Abdomen: Soft, non tender, non distended, bowel sounds present  Extremities: No edema, pulses DP and PT palpable bilaterally  Neuro: Grossly nonfocal  Data Reviewed: Basic Metabolic Panel:  Recent Labs Lab 03/06/15 1657 03/07/15 0303  NA 141 140  K 3.7 4.0  CL 103 99*  CO2 29 34*  GLUCOSE 85 196*  BUN 8 15   CREATININE 0.82 0.90  CALCIUM 8.9 8.9   Liver Function Tests:  Recent Labs Lab 03/06/15 1657 03/07/15 0303  AST 23 18  ALT 18 16*  ALKPHOS 80 75  BILITOT 0.8 0.4  PROT 6.5 6.1*  ALBUMIN 3.8 3.4*   No results for input(s): LIPASE, AMYLASE in the last 168 hours. No results for input(s): AMMONIA in the last 168 hours. CBC:  Recent Labs Lab 03/06/15 1657 03/07/15 0303  WBC 11.5* 5.0  NEUTROABS 8.1*  --   HGB 14.2 13.7  HCT 44.2 41.6  MCV 98.0 97.4  PLT 240 218   Cardiac Enzymes:  Recent Labs Lab 03/06/15 1657 03/06/15 2114 03/07/15 0303 03/07/15 0905  TROPONINI <0.03 <0.03 <0.03 <0.03   BNP: Invalid input(s): POCBNP CBG: No results for input(s): GLUCAP in the last 168 hours.  No results found for this or any previous visit (from the past 240 hour(s)).   Scheduled Meds: . aspirin EC  81 mg Oral Daily  . enoxaparin (LOVENOX)   40 mg Subcutaneous Q24H  . folic acid  1 mg Oral Daily  . furosemide  40 mg Oral Daily  . guaiFENesin  600 mg Oral BID  . ipratropium-albuterol  3 mL Nebulization Q4H  . levofloxacin   500 mg Intravenous Q24H  . methylPREDNISolone   60 mg Intravenous Q12H  . tamsulosin  0.4 mg Oral QPC supper

## 2015-03-07 NOTE — Progress Notes (Addendum)
Repeat EKG has been completed and Dr.Lama made aware of the results.

## 2015-03-08 DIAGNOSIS — J441 Chronic obstructive pulmonary disease with (acute) exacerbation: Principal | ICD-10-CM

## 2015-03-08 DIAGNOSIS — I5022 Chronic systolic (congestive) heart failure: Secondary | ICD-10-CM

## 2015-03-08 DIAGNOSIS — Z954 Presence of other heart-valve replacement: Secondary | ICD-10-CM

## 2015-03-08 DIAGNOSIS — Z720 Tobacco use: Secondary | ICD-10-CM

## 2015-03-08 DIAGNOSIS — N400 Enlarged prostate without lower urinary tract symptoms: Secondary | ICD-10-CM

## 2015-03-08 MED ORDER — LEVOFLOXACIN 500 MG PO TABS: 500.0000 mg | ORAL_TABLET | Freq: Every day | ORAL | Status: DC

## 2015-03-08 MED ORDER — ALBUTEROL SULFATE HFA 108 (90 BASE) MCG/ACT IN AERS: 2.0000 | INHALATION_SPRAY | Freq: Four times a day (QID) | RESPIRATORY_TRACT | Status: DC

## 2015-03-08 MED ORDER — ALBUTEROL SULFATE HFA 108 (90 BASE) MCG/ACT IN AERS: 1.0000 | INHALATION_SPRAY | Freq: Four times a day (QID) | RESPIRATORY_TRACT | Status: DC | PRN

## 2015-03-08 MED ORDER — PREDNISONE 5 MG PO TABS: 5.0000 mg | ORAL_TABLET | Freq: Every day | ORAL | Status: DC

## 2015-03-08 MED ORDER — IPRATROPIUM-ALBUTEROL 0.5-2.5 (3) MG/3ML IN SOLN: 3.0000 mL | RESPIRATORY_TRACT | Status: DC | PRN

## 2015-03-08 NOTE — Discharge Summary (Signed)
Physician Discharge Summary  [name] [name] [id number] DOB: 12/01/1945 DOA: 03/06/2015  PCP: Daryel NovemberMILLER,GARY, MD  Admit date: 03/06/2015 Discharge date: 03/08/2015  Time spent: 20 minutes  Recommendations for Outpatient Follow-up:  Follow up with PCP in 1-2 weeks  Discharge Diagnoses:  Active Problems:   COPD (chronic obstructive pulmonary disease)   BPH (benign prostatic hyperplasia)   COPD exacerbation   H/O aortic valve replacement   Tobacco abuse   Chronic systolic CHF (congestive heart failure)  Discharge Condition: Improved  Diet recommendation: Heart Healthy  Filed Weights   03/06/15 1605 03/06/15 2104 03/07/15 0453  Weight: 77.111 kg (170 lb) 75.66 kg (166 lb 12.8 oz) 75.615 kg (166 lb 11.2 oz)    History of present illness:  Please see admit h and p from 5/9 for details. Briefly, pt presents with sob in the setting of copd and active tobacco abuse. The patient was found to be hypoxic in the ED and was admitted for further work up.  Hospital Course:  Acute respiratory failure with hypoxia from acute COPD exacerbation - O2 sats of 88% in ED - Pt improved with scheduled duonebs and scheduled steroids. Lobar Pneumonia - Pt was continued on levaquin, to complete course on discharge - Improved BPH - Pt continued on Flomax Active tobacco abuse - Cessation done at bedside Chronic systolic CHF - EF of 40-45% - Clinically compensated DVT prophylaxis - Lovenox subq while inpatient  Discharge Exam: Filed Vitals:   03/07/15 2200 03/08/15 0241 03/08/15 0532 03/08/15 0708  BP:   103/49   Pulse:   63   Temp:   97.9 F (36.6 C)   TempSrc:   Oral   Resp:   18   Height:      Weight:      SpO2: 94% 93% 92% 95%    General: Awake, in nad Cardiovascular: regular, s1, s2 Respiratory: normal resp effort, no wheezing  Discharge Instructions     Medication List    STOP taking these medications        azithromycin 250 MG tablet  Commonly known as:  ZITHROMAX       TAKE these medications        albuterol 108 (90 BASE) MCG/ACT inhaler  Commonly known as:  PROVENTIL HFA;VENTOLIN HFA  Inhale 2 puffs into the lungs every 6 (six) hours.     albuterol 108 (90 BASE) MCG/ACT inhaler  Commonly known as:  PROAIR HFA  Inhale 1-2 puffs into the lungs every 6 (six) hours as needed for wheezing or shortness of breath.     aspirin EC 81 MG tablet  Take 81 mg by mouth daily.     folic acid 1 MG tablet  Commonly known as:  FOLVITE  Take 1 tablet (1 mg total) by mouth daily.     furosemide 40 MG tablet  Commonly known as:  LASIX  Take 1 tablet (40 mg total) by mouth daily.     ipratropium-albuterol 0.5-2.5 (3) MG/3ML Soln  Commonly known as:  DUONEB  Take 3 mLs by nebulization every 4 (four) hours as needed.     LEVITRA 10 MG tablet  Generic drug:  vardenafil  Take 10 mg by mouth as needed for erectile dysfunction.     levofloxacin 500 MG tablet  Commonly known as:  LEVAQUIN  Take 1 tablet (500 mg total) by mouth daily.     Potassium Chloride ER 20 MEQ Tbcr  Take with Lasix.     predniSONE 5 MG tablet  Commonly known as:  DELTASONE  Take 1 tablet (5 mg total) by mouth daily with breakfast.     tamsulosin 0.4 MG Caps capsule  Commonly known as:  FLOMAX  Take 1 capsule (0.4 mg total) by mouth daily after supper.       Allergies  Allergen Reactions  . Tiotropium Bromide Monohydrate Nausea Only and Anaphylaxis    Tremors  . Morphine And Related Other (See Comments)    Hallucinations   Follow-up Information    [facility] On 03/15/2015.   Specialty:  Internal Medicine   Why:  at 2:45 pm   Contact information:   [address], [place] [id number] [phone]        The results of significant diagnostics from this hospitalization (including imaging, microbiology, ancillary and laboratory) are listed below for reference.    Significant Diagnostic Studies: Dg Chest 2 View  03/06/2015   CLINICAL DATA:   Increasing shortness of breath for the past 2 days. Mild central chest pain today. Smoker.  EXAM: CHEST  2 VIEW  COMPARISON:  01/05/2015.  FINDINGS: Normal sized heart. Clear lungs. The lungs are hyperexpanded. Stable right axillary surgical clips. Stable left subclavian pacemaker leads and battery pack. Minimal thoracic spine degenerative changes. Old, healed right rib fractures.  IMPRESSION: No acute abnormality.  Stable changes of COPD.   Electronically Signed   By: Beckie SaltsSteven  Reid M.D.   On: 03/06/2015 18:09    Microbiology: No results found for this or any previous visit (from the past 240 hour(s)).   Labs: Basic Metabolic Panel:  Recent Labs Lab 03/06/15 1657 03/07/15 0303  NA 141 140  K 3.7 4.0  CL 103 99*  CO2 29 34*  GLUCOSE 85 196*  BUN 8 15  CREATININE 0.82 0.90  CALCIUM 8.9 8.9   Liver Function Tests:  Recent Labs Lab 03/06/15 1657 03/07/15 0303  AST 23 18  ALT 18 16*  ALKPHOS 80 75  BILITOT 0.8 0.4  PROT 6.5 6.1*  ALBUMIN 3.8 3.4*   No results for input(s): LIPASE, AMYLASE in the last 168 hours. No results for input(s): AMMONIA in the last 168 hours. CBC:  Recent Labs Lab 03/06/15 1657 03/07/15 0303  WBC 11.5* 5.0  NEUTROABS 8.1*  --   HGB 14.2 13.7  HCT 44.2 41.6  MCV 98.0 97.4  PLT 240 218   Cardiac Enzymes:  Recent Labs Lab 03/06/15 1657 03/06/15 2114 03/07/15 0303 03/07/15 0905  TROPONINI <0.03 <0.03 <0.03 <0.03   BNP: BNP (last 3 results)  Recent Labs  12/28/14 2025 01/05/15 0950 03/06/15 1657  BNP 131.0* 94.0 92.0    ProBNP (last 3 results)  Recent Labs  09/01/14 1722 09/04/14 2121 09/24/14 0704  PROBNP 653.8* 333.0* 798.5*    CBG: No results for input(s): GLUCAP in the last 168 hours.   Signed:  Kritika Stukes, Scheryl MartenSTEPHEN K  [facility] 03/08/2015, 5:26 PM

## 2015-03-08 NOTE — Progress Notes (Addendum)
Patient came to nursing station around 2020 and requested that I ask the doctor to increase the frequency of his steroid and change his nebulizer treatments to every 3-4 hours.  Patient was informed of the current nebulizer orders for every 4 hours and every 4 hours prn with secretary present.  I contacted K. Schoor and made her aware of the above requests and she stated to continue to current course of treatment and the rounding MD could reevaluate in the morning.  I informed the patient of this after getting off the phone.  He then call for the breathing treatment at 2130 and the respiratory therapist came and gave him a breathing treatment around 2200.  He then asked the respiratory therapist how often he could get his breathing treatment and she again reiterated the orders in the computer.  He then in front of myself became angry and said I told him the could have them every 3-4 hours.  I informed him he was incorrect and that he asked for them to be every 3-4 hours and that I had told him the order was for  Every 4 hours as needed.  He began yelling and cussing at myself calling me a liar.  Security was called by staff at the desk due to the disruption and loudness of the conversation.  Security came and asked the patient to calm down.  I asked the patient if he wanted another nurse and he said no.  He did calm down some.  Nursing staff to continue to monitor.

## 2015-03-08 NOTE — Progress Notes (Signed)
Discharge instructions and prescriptions given, verbalized understanding, out in stable condition via w/c with staff. 

## 2015-03-08 NOTE — Care Management Note (Signed)
Case Management Note  Patient Details  Name: [name] [name] MRN: [id number] Date of Birth: 02/13/1946  Subjective/Objective:                    Action/Plan:   Expected Discharge Date:                  Expected Discharge Plan:  Home/Self Care  In-House Referral:  NA  Discharge planning Services  CM Consult  Post Acute Care Choice:  NA Choice offered to:  NA  DME Arranged:    DME Agency:     HH Arranged:    HH Agency:     Status of Service:  Completed, signed off  Medicare Important Message Given:  N/A - LOS <3 / Initial given by admissions Date Medicare IM Given:    Medicare IM give by:    Date Additional Medicare IM Given:    Additional Medicare Important Message give by:     If discussed at Long Length of Stay Meetings, dates discussed:    Additional Comments: Pt discharged home today. No CM needs noted. Pt stated that he has 2 neb machines for home use. [name], Migdalia Olejniczak [place]der, RN 03/08/2015, 9:40 AM

## 2015-03-08 NOTE — Progress Notes (Signed)
Report of patient smoking in his room per NT.  Upon going in room it smelled strongly of cigarette smoke.  Patient denies smoking and says it is the scent of his deodorant spray.  Security called to speak with him and stress the dangers of smoking in the hospital and putting others at risk.  Nursing staff to continue to monitor.

## 2015-03-08 NOTE — Progress Notes (Signed)
Patient became very aggressive, loud and verbally abusive with  staff after he complained about needing his breathing treatment. I agreed to treat the patient early for his 0000 HHN at 2200. Afterwards he began to shout and scream at myself and his nurse about neglecting his needs. Both of us explained to him when he could have have his treatments and the time in which they would be given. He then started yelling very loud that the entire floor heard him yelling and screaming at us. Security was called to patient room to deal with his out burst. He calmed down just enough to sit down. Later through the night he still became loud and aggressive about his treatments. I explained his treatment schedule as well as the nurse. Patient has been very forward sexually in his conversations about his partners past and present. Nurse was made aware about his conversations she was not present for.

## 2015-05-30 ENCOUNTER — Observation Stay
Admission: EM | Admit: 2015-05-30 | Discharge: 2015-05-31 | Disposition: A | Discharge status: Home / Self Care | Payer: Medicare HMO | Attending: Specialist | Admitting: Specialist

## 2015-05-30 ENCOUNTER — Encounter: Payer: Self-pay | Admitting: *Deleted

## 2015-05-30 ENCOUNTER — Emergency Department: Payer: Medicare HMO

## 2015-05-30 DIAGNOSIS — Z888 Allergy status to other drugs, medicaments and biological substances status: Secondary | ICD-10-CM | POA: Insufficient documentation

## 2015-05-30 DIAGNOSIS — J45909 Unspecified asthma, uncomplicated: Secondary | ICD-10-CM | POA: Diagnosis not present

## 2015-05-30 DIAGNOSIS — Z885 Allergy status to narcotic agent status: Secondary | ICD-10-CM | POA: Insufficient documentation

## 2015-05-30 DIAGNOSIS — Z7952 Long term (current) use of systemic steroids: Secondary | ICD-10-CM | POA: Insufficient documentation

## 2015-05-30 DIAGNOSIS — J441 Chronic obstructive pulmonary disease with (acute) exacerbation: Principal | ICD-10-CM | POA: Insufficient documentation

## 2015-05-30 DIAGNOSIS — I509 Heart failure, unspecified: Secondary | ICD-10-CM | POA: Diagnosis not present

## 2015-05-30 DIAGNOSIS — I2510 Atherosclerotic heart disease of native coronary artery without angina pectoris: Secondary | ICD-10-CM | POA: Insufficient documentation

## 2015-05-30 DIAGNOSIS — Z953 Presence of xenogenic heart valve: Secondary | ICD-10-CM | POA: Insufficient documentation

## 2015-05-30 DIAGNOSIS — Z79899 Other long term (current) drug therapy: Secondary | ICD-10-CM | POA: Diagnosis not present

## 2015-05-30 DIAGNOSIS — F17210 Nicotine dependence, cigarettes, uncomplicated: Secondary | ICD-10-CM | POA: Insufficient documentation

## 2015-05-30 DIAGNOSIS — N400 Enlarged prostate without lower urinary tract symptoms: Secondary | ICD-10-CM | POA: Diagnosis not present

## 2015-05-30 DIAGNOSIS — F329 Major depressive disorder, single episode, unspecified: Secondary | ICD-10-CM | POA: Insufficient documentation

## 2015-05-30 DIAGNOSIS — I495 Sick sinus syndrome: Secondary | ICD-10-CM | POA: Diagnosis not present

## 2015-05-30 DIAGNOSIS — R0602 Shortness of breath: Secondary | ICD-10-CM | POA: Insufficient documentation

## 2015-05-30 DIAGNOSIS — F419 Anxiety disorder, unspecified: Secondary | ICD-10-CM | POA: Insufficient documentation

## 2015-05-30 DIAGNOSIS — Z950 Presence of cardiac pacemaker: Secondary | ICD-10-CM | POA: Diagnosis not present

## 2015-05-30 DIAGNOSIS — Z7982 Long term (current) use of aspirin: Secondary | ICD-10-CM | POA: Diagnosis not present

## 2015-05-30 DIAGNOSIS — Z8249 Family history of ischemic heart disease and other diseases of the circulatory system: Secondary | ICD-10-CM | POA: Insufficient documentation

## 2015-05-30 DIAGNOSIS — R05 Cough: Secondary | ICD-10-CM | POA: Insufficient documentation

## 2015-05-30 DIAGNOSIS — F319 Bipolar disorder, unspecified: Secondary | ICD-10-CM | POA: Diagnosis not present

## 2015-05-30 DIAGNOSIS — I350 Nonrheumatic aortic (valve) stenosis: Secondary | ICD-10-CM | POA: Diagnosis not present

## 2015-05-30 LAB — CBC WITH DIFFERENTIAL/PLATELET
Basophils Absolute: 0.1 10*3/uL (ref 0–0.1)
Basophils Relative: 1 %
Eosinophils Absolute: 0.3 10*3/uL (ref 0–0.7)
Eosinophils Relative: 3 %
HCT: 40.7 % (ref 40.0–52.0)
Hemoglobin: 13.7 g/dL (ref 13.0–18.0)
Lymphocytes Relative: 20 %
Lymphs Abs: 2.2 10*3/uL (ref 1.0–3.6)
MCH: 31.9 pg (ref 26.0–34.0)
MCHC: 33.8 g/dL (ref 32.0–36.0)
MCV: 94.6 fL (ref 80.0–100.0)
Monocytes Absolute: 1.2 10*3/uL — ABNORMAL HIGH (ref 0.2–1.0)
Monocytes Relative: 11 %
Neutro Abs: 7 10*3/uL — ABNORMAL HIGH (ref 1.4–6.5)
Neutrophils Relative %: 65 %
Platelets: 251 10*3/uL (ref 150–440)
RBC: 4.3 MIL/uL — ABNORMAL LOW (ref 4.40–5.90)
RDW: 14.8 % — ABNORMAL HIGH (ref 11.5–14.5)
WBC: 10.7 10*3/uL — ABNORMAL HIGH (ref 3.8–10.6)

## 2015-05-30 LAB — BASIC METABOLIC PANEL
Anion gap: 11 (ref 5–15)
BUN: 11 mg/dL (ref 6–20)
CO2: 24 mmol/L (ref 22–32)
Calcium: 9.3 mg/dL (ref 8.9–10.3)
Chloride: 102 mmol/L (ref 101–111)
Creatinine, Ser: 0.9 mg/dL (ref 0.61–1.24)
GFR calc Af Amer: >60 mL/min (ref >60)
GFR calc non Af Amer: >60 mL/min (ref >60)
Glucose, Bld: 84 mg/dL (ref 65–99)
Potassium: 4.3 mmol/L (ref 3.5–5.1)
Sodium: 137 mmol/L (ref 135–145)

## 2015-05-30 LAB — TROPONIN I: Troponin I: <0.03 ng/mL (ref <0.031)

## 2015-05-30 MED ORDER — PREDNISONE 20 MG PO TABS: 60.0000 mg | ORAL_TABLET | Freq: Every day | ORAL | Status: DC

## 2015-05-30 MED ORDER — IPRATROPIUM-ALBUTEROL 0.5-2.5 (3) MG/3ML IN SOLN
3.0000 mL | Freq: Once | RESPIRATORY_TRACT | Status: AC
  Administered 2015-05-30: 3 mL via RESPIRATORY_TRACT
  Filled 2015-05-30: qty 3

## 2015-05-30 MED ORDER — ONDANSETRON HCL 4 MG/2ML IJ SOLN: 4.0000 mg | Freq: Four times a day (QID) | INTRAMUSCULAR | Status: DC | PRN

## 2015-05-30 MED ORDER — ACETAMINOPHEN 325 MG PO TABS: 650.0000 mg | ORAL_TABLET | Freq: Four times a day (QID) | ORAL | Status: DC | PRN

## 2015-05-30 MED ORDER — DOCUSATE SODIUM 100 MG PO CAPS
100.0000 mg | ORAL_CAPSULE | Freq: Every day | ORAL | Status: DC
  Administered 2015-05-31: 10:00:00 100 mg via ORAL
  Filled 2015-05-30: qty 1

## 2015-05-30 MED ORDER — ASPIRIN EC 81 MG PO TBEC
81.0000 mg | DELAYED_RELEASE_TABLET | Freq: Every day | ORAL | Status: DC
  Administered 2015-05-31: 81 mg via ORAL
  Filled 2015-05-30: qty 1

## 2015-05-30 MED ORDER — METHYLPREDNISOLONE SODIUM SUCC 125 MG IJ SOLR
125.0000 mg | Freq: Once | INTRAMUSCULAR | Status: AC
  Administered 2015-05-30: 125 mg via INTRAVENOUS
  Filled 2015-05-30: qty 2

## 2015-05-30 MED ORDER — TAMSULOSIN HCL 0.4 MG PO CAPS
0.4000 mg | ORAL_CAPSULE | Freq: Every day | ORAL | Status: DC
  Administered 2015-05-31: 0.4 mg via ORAL
  Filled 2015-05-30: qty 1

## 2015-05-30 MED ORDER — BUDESONIDE-FORMOTEROL FUMARATE 80-4.5 MCG/ACT IN AERO
2.0000 | INHALATION_SPRAY | Freq: Two times a day (BID) | RESPIRATORY_TRACT | Status: DC
  Administered 2015-05-31: 2 via RESPIRATORY_TRACT
  Filled 2015-05-30: qty 6.9

## 2015-05-30 MED ORDER — SODIUM CHLORIDE 0.9 % IV BOLUS (SEPSIS)
500.0000 mL | Freq: Once | INTRAVENOUS | Status: AC
  Administered 2015-05-30: 500 mL via INTRAVENOUS

## 2015-05-30 MED ORDER — FOLIC ACID 1 MG PO TABS
1.0000 mg | ORAL_TABLET | Freq: Every day | ORAL | Status: DC
  Administered 2015-05-31: 1 mg via ORAL
  Filled 2015-05-30: qty 1

## 2015-05-30 MED ORDER — IPRATROPIUM-ALBUTEROL 0.5-2.5 (3) MG/3ML IN SOLN
3.0000 mL | Freq: Four times a day (QID) | RESPIRATORY_TRACT | Status: DC
  Administered 2015-05-31 (×2): 3 mL via RESPIRATORY_TRACT
  Filled 2015-05-30 (×2): qty 3

## 2015-05-30 MED ORDER — FUROSEMIDE 20 MG PO TABS
40.0000 mg | ORAL_TABLET | Freq: Every day | ORAL | Status: DC
  Administered 2015-05-31: 10:00:00 40 mg via ORAL
  Filled 2015-05-30: qty 2

## 2015-05-30 MED ORDER — METHYLPREDNISOLONE SODIUM SUCC 125 MG IJ SOLR
60.0000 mg | INTRAMUSCULAR | Status: DC
  Administered 2015-05-31: 60 mg via INTRAVENOUS
  Filled 2015-05-30: qty 2

## 2015-05-30 MED ORDER — HEPARIN SODIUM (PORCINE) 5000 UNIT/ML IJ SOLN: 5000.0000 [IU] | Freq: Three times a day (TID) | INTRAMUSCULAR | Status: DC

## 2015-05-30 MED ORDER — ACETAMINOPHEN 650 MG RE SUPP: 650.0000 mg | Freq: Four times a day (QID) | RECTAL | Status: DC | PRN

## 2015-05-30 MED ORDER — ALBUTEROL SULFATE HFA 108 (90 BASE) MCG/ACT IN AERS: 2.0000 | INHALATION_SPRAY | Freq: Four times a day (QID) | RESPIRATORY_TRACT | Status: DC | PRN

## 2015-05-30 MED ORDER — VARDENAFIL HCL 10 MG PO TABS: 10.0000 mg | ORAL_TABLET | ORAL | Status: DC | PRN

## 2015-05-30 MED ORDER — ONDANSETRON HCL 4 MG PO TABS: 4.0000 mg | ORAL_TABLET | Freq: Four times a day (QID) | ORAL | Status: DC | PRN

## 2015-05-30 NOTE — ED Provider Notes (Signed)
[facility] Emergency Department Provider Note  ____________________________________________  Time seen: Approximately 6:47 PM  I have reviewed the triage vital signs and the nursing notes.   HISTORY  Chief Complaint Shortness of Breath    HPI [name] [name] is a 69 y.o. 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 Patient reports that short of breath began at approximate 4:30 PM today. He has mild chest tightness but no associated pain. No sudden sweating, nausea, vomiting. He has had nonproductive cough. No fevers. Prior to today, he had been in his usual state of health.   Past Medical History  Diagnosis Date  . Severe aortic valve stenosis     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 at [facility]  . COPD (chronic obstructive pulmonary disease)   . Thoracic ascending aortic aneurysm     repaired 06/2014 at [facility]  . COPD (chronic obstructive pulmonary disease)   . BPH (benign prostatic hyperplasia)   . Pacemaker     battery replaced 06/2014 at [facility]  . SSS (sick sinus syndrome)     s/p PPM   . CAD (coronary artery disease)     ?MI in 2007  . Pneumothorax     traumatic 2007  . Anxiety and depression   . Psychosis   . Bipolar disorder   . CHF (congestive heart failure)   . Neuropathic pain   . Homelessness   . Tobacco abuse     Patient Active Problem List   Diagnosis Date Noted  . Leukocytosis   . Chronic systolic CHF (congestive heart failure)   . COPD with acute exacerbation 12/15/2014  . Hyperglycemia, drug-induced 12/06/2014  . Bipolar 1 disorder 12/05/2014  . Insomnia 12/05/2014  . Tobacco abuse 12/05/2014  . Homelessness 12/05/2014  .  Chronic combined systolic and diastolic CHF (congestive heart failure) 12/05/2014  . HLD (hyperlipidemia) 12/05/2014  . Neuropathic pain 12/05/2014  . Manic behavior   . COPD exacerbation 09/24/2014  . H/O aortic valve replacement 09/24/2014  . History of permanent cardiac pacemaker placement 09/24/2014  . SOB (shortness of breath) 09/24/2014  . HCAP (healthcare-associated pneumonia) 09/24/2014  . Acute diastolic CHF (congestive heart failure) 09/24/2014  . Coronary artery disease due to calcified coronary lesion   . Bipolar I disorder, single manic episode, severe, with psychosis   . COPD (chronic obstructive pulmonary disease)   . Severe aortic valve stenosis   . BPH (benign prostatic hyperplasia)   . CAD (coronary artery disease)   . Psychosis 09/05/2014  . Psychoses     Past Surgical History  Procedure Laterality Date  . Pacemaker placement  2007    battery replaced 07/04/2014  . Hiatal hernia repair    . Knee surgery      L5-6  . Lumbar fusion    . Aortic valve replacement      porcine valve  . Cardiac surgery      ascending thoracic aneurysm repair 06/2014    Current Outpatient Rx  Name  Route  Sig  Dispense  Refill  . aspirin EC 81 MG tablet   Oral   Take 81 mg by mouth daily.         . budesonide-formoterol (SYMBICORT) 80-4.5 MCG/ACT inhaler   Inhalation   Inhale  2 puffs into the lungs 2 (two) times daily.         Marland Kitchen docusate sodium (COLACE) 100 MG capsule   Oral   Take 100 mg by mouth daily.         . folic acid (FOLVITE) 1 MG tablet   Oral   Take 1 tablet (1 mg total) by mouth daily.   30 tablet   0   . furosemide (LASIX) 40 MG tablet   Oral   Take 1 tablet (40 mg total) by mouth daily.   30 tablet   0   . ipratropium-albuterol (DUONEB) 0.5-2.5 (3) MG/3ML SOLN   Nebulization   Take 3 mLs by nebulization every 4 (four) hours as needed. Patient taking differently: Take 3 mLs by nebulization every 4 (four) hours as needed (for shortness of  breath).    24 mL   0   . levalbuterol (XOPENEX HFA) 45 MCG/ACT inhaler   Inhalation   Inhale 1 puff into the lungs every 4 (four) hours as needed for wheezing or shortness of breath.         . potassium chloride SA (K-DUR,KLOR-CON) 20 MEQ tablet   Oral   Take 20 mEq by mouth daily.         . tamsulosin (FLOMAX) 0.4 MG CAPS capsule   Oral   Take 0.4 mg by mouth at bedtime.         . vardenafil (LEVITRA) 10 MG tablet   Oral   Take 10 mg by mouth as needed for erectile dysfunction.         Marland Kitchen albuterol (PROAIR HFA) 108 (90 BASE) MCG/ACT inhaler   Inhalation   Inhale 1-2 puffs into the lungs every 6 (six) hours as needed for wheezing or shortness of breath. Patient not taking: Reported on 05/30/2015   1 Inhaler   0   . albuterol (PROVENTIL HFA;VENTOLIN HFA) 108 (90 BASE) MCG/ACT inhaler   Inhalation   Inhale 2 puffs into the lungs every 6 (six) hours. Patient not taking: Reported on 05/30/2015   1 Inhaler   0   . levofloxacin (LEVAQUIN) 500 MG tablet   Oral   Take 1 tablet (500 mg total) by mouth daily. Patient not taking: Reported on 05/30/2015   7 tablet   0   . predniSONE (DELTASONE) 5 MG tablet   Oral   Take 1 tablet (5 mg total) by mouth daily with breakfast. Patient not taking: Reported on 05/30/2015   81 tablet   0     Taper dose:  po x 3 days, then  po x 3 day ...     Allergies Tiotropium bromide monohydrate and Morphine and related  Family History  Problem Relation Age of Onset  . CAD Father   . Diabetes Father   . High blood pressure Brother   . Cancer Brother     Social History History  Substance Use Topics  . Smoking status: Current Some Day Smoker -- 1.00 packs/day    Types: Cigarettes  . Smokeless tobacco: Not on file  . Alcohol Use: Yes     Comment: occ    Review of Systems Constitutional: No fever/chills Eyes: No visual changes. ENT: No sore throat. Cardiovascular: + chest tightness Respiratory: +shortness of  breath. Gastrointestinal: No abdominal pain.  No nausea, no vomiting.  No diarrhea.  No constipation. Genitourinary: Negative for dysuria. Musculoskeletal: Negative for back pain. Skin: Negative for rash. Neurological: Negative for headaches, focal weakness or numbness.  10-point ROS otherwise negative.  ____________________________________________   PHYSICAL EXAM:  VITAL SIGNS: ED Triage Vitals  Enc Vitals Group     BP 05/30/15 1831 78/47 mmHg     Pulse Rate 05/30/15 1831 86     Resp 05/30/15 1831 26     Temp 05/30/15 1831 97.7 F (36.5 C)     Temp Source 05/30/15 1831 Oral     SpO2 05/30/15 1831 96 %     Weight 05/30/15 1831 150 lb (68.04 kg)     Height 05/30/15 1831 5\' 10"  (1.778 m)     Head Cir --      Peak Flow --      Pain Score 05/30/15 1832 5     Pain Loc --      Pain Edu? --      Excl. in GC? --     Constitutional: Alert and oriented. Well appearing and in no acute distress. Eyes: Conjunctivae are normal. PERRL. EOMI. Head: Atraumatic. Nose: No congestion/rhinnorhea. Mouth/Throat: Mucous membranes are moist.  Oropharynx non-erythematous. Neck: No stridor.   Cardiovascular: Normal rate, regular rhythm. Grossly normal heart sounds.  Good peripheral circulation. Respiratory: Tachypnea with poor air movement throughout. Gastrointestinal: Soft and nontender. No distention. No abdominal bruits. No CVA tenderness. Genitourinary: deferred Musculoskeletal: No lower extremity tenderness nor edema.  No joint effusions. Neurologic:  Normal speech and language. No gross focal neurologic deficits are appreciated. No gait instability. Skin:  Skin is warm, dry and intact. No rash noted. Psychiatric: Mood and affect are normal. Speech and behavior are normal.  ____________________________________________   LABS (all labs ordered are listed, but only abnormal results are displayed)  Labs Reviewed  CBC WITH DIFFERENTIAL/PLATELET - Abnormal; Notable for the following:     WBC 10.7 (*)    RBC 4.30 (*)    RDW 14.8 (*)    Neutro Abs 7.0 (*)    Monocytes Absolute 1.2 (*)    All other components within normal limits  BASIC METABOLIC PANEL  TROPONIN I   ____________________________________________  EKG  ED ECG REPORT I, Gayla Doss, the attending physician, personally viewed and interpreted this ECG.   Date: 05/30/2015  EKG Time: 18:34  Rate: 83  Rhythm: Electronic ventricular pacemaker   ____________________________________________  RADIOLOGY  CXR  FINDINGS: Left transvenous pacemaker leads to the right atrium and right ventricle. Status post median sternotomy. Valve replacement. Heart size is normal. Lungs are mildly hyperinflated. There are no focal consolidations. No pleural effusions. Surgical clips overlie the right scapula. Old rib fractures.  IMPRESSION: No active disease.  ____________________________________________   PROCEDURES  Procedure(s) performed: None  Critical Care performed: No  ____________________________________________   INITIAL IMPRESSION / ASSESSMENT AND PLAN / ED COURSE  Pertinent labs & imaging results that were available during my care of the patient were reviewed by me and considered in my medical decision making (see chart for details).  AL[place]AND[place] CURRENT is a 69 y.o. male with history of COPD, history of aortic valve stenosis status post porcine valve replacement, history of sick sinus syndrome status post pacemaker, history of thoracic aortic aneurysm status post repair, coronary artery disease who presents for evaluation of shortness of breath which he feels is consistent with his usual COPD exacerbations. On exam, he has mildly tachypneic, not hypoxic, has poor air movement bilaterally. EKG shows paced rhythm. Troponin negative and he denies any chest pain specifically. Doubt ACS. Labs reviewed by me and are generally unremarkable. We'll treat with DuoNeb, steroids, reassess for  disposition.  ----------------------------------------- 9:28 PM on 05/30/2015 -----------------------------------------  Patient receiving the above treatments. Care transferred to Dr. Langston Masker pending reassessment, final disposition. ____________________________________________   FINAL CLINICAL IMPRESSION(S) / ED DIAGNOSES  Final diagnoses:  COPD exacerbation      Gayla Doss, MD 05/30/15 2130
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
## 2015-05-30 NOTE — ED Provider Notes (Signed)
  Physical Exam  BP 93/70 mmHg  Pulse 79  Temp(Src) 97.7 F (36.5 C) (Oral)  Resp 18  Ht  (1.778 m)  Wt 150 lb (68.04 kg)  BMI 21.52 kg/m2  SpO2 90%  Physical Exam Patient without any respiratory distress but with minimal air movement and diffuse wheezing. Satting 90-91% on room air. ED Course  Procedures  MDM Patient also feeling subjectively poor. Not moving air. We'll admit to the hospital for further treatment for his COPD exacerbation. Signed out to Dr. Clint Guy.      Myrna Blazer, MD 05/30/15 [phone]

## 2015-05-31 MED ORDER — BUDESONIDE-FORMOTEROL FUMARATE 80-4.5 MCG/ACT IN AERO: 2.0000 | INHALATION_SPRAY | Freq: Two times a day (BID) | RESPIRATORY_TRACT | Status: DC

## 2015-05-31 MED ORDER — ALBUTEROL SULFATE HFA 108 (90 BASE) MCG/ACT IN AERS: 2.0000 | INHALATION_SPRAY | Freq: Four times a day (QID) | RESPIRATORY_TRACT | Status: DC

## 2015-05-31 MED ORDER — PREDNISONE 10 MG PO TABS: ORAL_TABLET | ORAL | Status: DC

## 2015-05-31 NOTE — Discharge Summary (Signed)
[facility] - [place] at [facility]   PATIENT NAME: [name] [name]    MR#:  [id number]  DATE OF BIRTH:  [birth year]  DATE OF ADMISSION:  05/30/2015 ADMITTING PHYSICIAN: Wyatt Haste, MD  DATE OF DISCHARGE: 05/31/2015 12:00 PM  PRIMARY CARE PHYSICIAN: Daryel November, MD    ADMISSION DIAGNOSIS:  COPD exacerbation [J44.1]  DISCHARGE DIAGNOSIS:  Active Problems:   COPD exacerbation   SECONDARY DIAGNOSIS:   Past Medical History  Diagnosis Date  . Severe aortic valve stenosis     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 at [facility]  . COPD (chronic obstructive pulmonary disease)   . Thoracic ascending aortic aneurysm     repaired 06/2014 at [facility]  . COPD (chronic obstructive pulmonary disease)   . BPH (benign prostatic hyperplasia)   . Pacemaker     battery replaced 06/2014 at [facility]  . SSS (sick sinus syndrome)     s/p PPM   . CAD (coronary artery disease)     ?MI in 2007  . Pneumothorax     traumatic 2007  . Anxiety and depression   . Psychosis   . Bipolar disorder   . CHF (congestive heart failure)   . Neuropathic pain   . Homelessness   . Tobacco abuse     HOSPITAL COURSE:   69 year old male with past medical history of anxiety/depression, bipolar disorder, CHF, neuropathic pain, COPD, history of severe aortic stenosis, BPH, status post pacemaker, who presented to the hospital he shortness of breath and noted to be in COPD exacerbation.  #1 COPD exacerbation-this was likely secondary to ongoing tobacco abuse. -Patient was treated with IV steroids, around-the-clock number of treatments. Maintained on his Symbicort and albuterol inhaler. He has clinically improved and therefore is being discharged home on oral prednisone taper. -His chest x-ray admission was negative for pneumonia therefore he was not discharged on antibiotics.  #2 BPH-there was no evidence of urinary retention patient will continue his Flomax.  #3 CHF-there was no evidence of any  acute CHF exacerbation. Patient will continue his Lasix.  DISCHARGE CONDITIONS:   Stable  CONSULTS OBTAINED:  Treatment Team:  Wyatt Haste, MD  DRUG ALLERGIES:   Allergies  Allergen Reactions  . Tiotropium Bromide Monohydrate Nausea And Vomiting and Other (See Comments)    Reaction:  Tremors  . Morphine And Related Other (See Comments)    Reaction:  Hallucinations     DISCHARGE MEDICATIONS:   Discharge Medication List as of 05/31/2015 11:48 AM    CONTINUE these medications which have CHANGED   Details  !! albuterol (PROVENTIL HFA;VENTOLIN HFA) 108 (90 BASE) MCG/ACT inhaler Inhale 2 puffs into the lungs every 6 (six) hours as needed for wheezing or shortness of breath., Starting 05/30/2015, Until Discontinued, Print    !! albuterol (PROVENTIL HFA;VENTOLIN HFA) 108 (90 BASE) MCG/ACT inhaler Inhale 2 puffs into the lungs every 6 (six) hours., Starting 05/31/2015, Until Discontinued, Print    !! budesonide-formoterol (SYMBICORT) 80-4.5 MCG/ACT inhaler Inhale 2 puffs into the lungs 2 (two) times daily., Starting 05/31/2015, Until Discontinued, Print    predniSONE (DELTASONE) 10 MG tablet Label  & dispense according to the schedule below. 5 Pills PO for 1 day then, 4 Pills PO for 1 day, 3 Pills PO for 1 day, 2 Pills PO for 1 day, 1 Pill PO for 1 days then STOP., Print     !! - Potential duplicate medications found. Please discuss with provider.    CONTINUE these medications which have NOT  CHANGED   Details  aspirin EC 81 MG tablet Take 81 mg by mouth daily., Until Discontinued, Historical Med    !! budesonide-formoterol (SYMBICORT) 80-4.5 MCG/ACT inhaler Inhale 2 puffs into the lungs 2 (two) times daily., Until Discontinued, Historical Med    docusate sodium (COLACE) 100 MG capsule Take 100 mg by mouth daily., Until Discontinued, Historical Med    folic acid (FOLVITE) 1 MG tablet Take 1 tablet (1 mg total) by mouth daily., Starting 10/04/2014, Until Discontinued, Normal    furosemide  (LASIX) 40 MG tablet Take 1 tablet (40 mg total) by mouth daily., Starting 01/05/2015, Until Discontinued, Print    ipratropium-albuterol (DUONEB) 0.5-2.5 (3) MG/3ML SOLN Take 3 mLs by nebulization every 4 (four) hours as needed., Starting 03/08/2015, Until Discontinued, Normal    levalbuterol (XOPENEX HFA) 45 MCG/ACT inhaler Inhale 1 puff into the lungs every 4 (four) hours as needed for wheezing or shortness of breath., Until Discontinued, Historical Med    potassium chloride SA (K-DUR,KLOR-CON) 20 MEQ tablet Take 20 mEq by mouth daily., Until Discontinued, Historical Med    tamsulosin (FLOMAX) 0.4 MG CAPS capsule Take 0.4 mg by mouth at bedtime., Until Discontinued, Historical Med    vardenafil (LEVITRA) 10 MG tablet Take 10 mg by mouth as needed for erectile dysfunction., Until Discontinued, Historical Med    !! albuterol (PROAIR HFA) 108 (90 BASE) MCG/ACT inhaler Inhale 1-2 puffs into the lungs every 6 (six) hours as needed for wheezing or shortness of breath., Starting 03/08/2015, Until Discontinued, Normal     !! - Potential duplicate medications found. Please discuss with provider.    STOP taking these medications     levofloxacin (LEVAQUIN) 500 MG tablet          DISCHARGE INSTRUCTIONS:   DIET:  Cardiac diet  DISCHARGE CONDITION:  Stable  ACTIVITY:  Activity as tolerated  OXYGEN:  Home Oxygen: No.   Oxygen Delivery: room air  DISCHARGE LOCATION:  home   If you experience worsening of your admission symptoms, develop shortness of breath, life threatening emergency, suicidal or homicidal thoughts you must seek medical attention immediately by calling 911 or calling your MD immediately  if symptoms less severe.  You Must read complete instructions/literature along with all the possible adverse reactions/side effects for all the Medicines you take and that have been prescribed to you. Take any new Medicines after you have completely understood and accpet all the  possible adverse reactions/side effects.   Please note  You were cared for by a hospitalist during your hospital stay. If you have any questions about your discharge medications or the care you received while you were in the hospital after you are discharged, you can call the unit and asked to speak with the hospitalist on call if the hospitalist that took care of you is not available. Once you are discharged, your primary care physician will handle any further medical issues. Please note that NO REFILLS for any discharge medications will be authorized once you are discharged, as it is imperative that you return to your primary care physician (or establish a relationship with a primary care physician if you do not have one) for your aftercare needs so that they can reassess your need for medications and monitor your lab values.     Today   Shortness of breath, wheezing has improved. No chest pain. Wants to go home.  VITAL SIGNS:  Blood pressure 129/74, pulse 79, temperature 97.6 F (36.4 C), temperature source Oral, resp. rate  20, height 5\' 10"  (1.778 m), weight 76.25 kg (168 lb 1.6 oz), SpO2 95 %.  I/O:   Intake/Output Summary (Last 24 hours) at 05/31/15 1557 Last data filed at 05/31/15 0900  Gross per 24 hour  Intake    480 ml  Output      0 ml  Net    480 ml    PHYSICAL EXAMINATION:  GENERAL:  69 y.o.-year-old patient lying in the bed with no acute distress.  EYES: Pupils equal, round, reactive to light and accommodation. No scleral icterus. Extraocular muscles intact.  HEENT: Head atraumatic, normocephalic. Oropharynx and nasopharynx clear.  NECK:  Supple, no jugular venous distention. No thyroid enlargement, no tenderness.  LUNGS: Prolonged inspiratory and expiratory phase. no wheezing, rales,rhonchi. No use of accessory muscles of respiration.  CARDIOVASCULAR: S1, S2 normal. No murmurs, rubs, or gallops.  ABDOMEN: Soft, non-tender, non-distended. Bowel sounds present. No  organomegaly or mass.  EXTREMITIES: No pedal edema, cyanosis, or clubbing.  NEUROLOGIC: Cranial nerves II through XII are intact. No focal motor or sensory defecits b/l.  PSYCHIATRIC: The patient is alert and oriented x 3. Good affect.  SKIN: No obvious rash, lesion, or ulcer.   DATA REVIEW:   CBC  Recent Labs Lab 05/30/15 1934  WBC 10.7*  HGB 13.7  HCT 40.7  PLT 251    Chemistries   Recent Labs Lab 05/30/15 1838  NA 137  K 4.3  CL 102  CO2 24  GLUCOSE 84  BUN 11  CREATININE 0.90  CALCIUM 9.3    Cardiac Enzymes  Recent Labs Lab 05/30/15 1838  TROPONINI <0.03    Microbiology Results  No results found for this or any previous visit.  RADIOLOGY:  Dg Chest Portable 1 View  05/30/2015   CLINICAL DATA:  Shortness of breath. Low blood pressure, tightness in the chest. History of COPD and asthma. Open heart surgery in October. Smoker.  EXAM: PORTABLE CHEST - 1 VIEW  COMPARISON:  None.  FINDINGS: Left transvenous pacemaker leads to the right atrium and right ventricle. Status post median sternotomy. Valve replacement. Heart size is normal. Lungs are mildly hyperinflated. There are no focal consolidations. No pleural effusions. Surgical clips overlie the right scapula. Old rib fractures.  IMPRESSION: No active disease.   Electronically Signed   By: Norva Pavlov M.D.   On: 05/30/2015 19:37      Management plans discussed with the patient, family and they are in agreement.  CODE STATUS:   TOTAL TIME TAKING CARE OF THIS PATIENT: 40 minutes.    Houston Siren M.D on 05/31/2015 at 3:57 PM  Between 7am to 6pm - Pager - [phone]  After 6pm go to [email or web address] - password [email or web address] [facility]  [place] [facility]  Office  [phone]  CC: Primary care physician; Daryel November, MD

## 2015-05-31 NOTE — Discharge Instructions (Signed)

## 2015-05-31 NOTE — Plan of Care (Signed)
Problem: Discharge Progression Outcomes Goal: Discharge plan in place and appropriate Outcome: Progressing Individualization of Care Pt prefers to be called [name] [name] Hx of severe aortic valve stenosis with replacement, COPD, thoracic ascending aortic aneurysm, BPH, pacemaker, sick sinus syndrome, CAD, pneumothorax, anxiety, depression, psychosis, bipolar, CHF, neuropathic pain, homelessness, tobacco abuse.  Admitted for COPD exacerbation and dyspnea.     Goal: Other Discharge Outcomes/Goals Progression toward goals:  1.  Pain:  Patient without complaints of pain this shift. 2.  Hemodynamically:  Patient afebrile this shift.  VSS BP 116/77 mmHg  Pulse 80  Temp(Src) 98.6 F (37 C) (Oral)  Resp 20  Ht  (1.778 m)  Wt 168 lb 1.6 oz (76.25 kg)  BMI 24.12 kg/m2  SpO2 95% 3.  Complications:  Patient without SOB or dyspnea this shift.  Lung sounds clear but diminished. 4.  Diet:  Patient on cardiac diet.  Patient with excellent appetite. 5.  Activity:  Patient up independently ambulating in room and hall. 6.  D/C:  Patient hoping to be discharged today

## 2015-07-24 ENCOUNTER — Emergency Department (HOSPITAL_COMMUNITY): Payer: Medicare HMO

## 2015-07-24 ENCOUNTER — Encounter (HOSPITAL_COMMUNITY): Payer: Self-pay | Admitting: Emergency Medicine

## 2015-07-24 ENCOUNTER — Emergency Department (HOSPITAL_COMMUNITY)
Admission: EM | Admit: 2015-07-24 | Discharge: 2015-07-25 | Disposition: A | Discharge status: Home / Self Care | Payer: Medicare HMO | Attending: Emergency Medicine | Admitting: Emergency Medicine

## 2015-07-24 DIAGNOSIS — I2510 Atherosclerotic heart disease of native coronary artery without angina pectoris: Secondary | ICD-10-CM | POA: Diagnosis not present

## 2015-07-24 DIAGNOSIS — Z950 Presence of cardiac pacemaker: Secondary | ICD-10-CM | POA: Diagnosis not present

## 2015-07-24 DIAGNOSIS — Z79899 Other long term (current) drug therapy: Secondary | ICD-10-CM | POA: Insufficient documentation

## 2015-07-24 DIAGNOSIS — J441 Chronic obstructive pulmonary disease with (acute) exacerbation: Secondary | ICD-10-CM

## 2015-07-24 DIAGNOSIS — R0602 Shortness of breath: Secondary | ICD-10-CM | POA: Diagnosis present

## 2015-07-24 DIAGNOSIS — Z9889 Other specified postprocedural states: Secondary | ICD-10-CM | POA: Diagnosis not present

## 2015-07-24 DIAGNOSIS — I509 Heart failure, unspecified: Secondary | ICD-10-CM | POA: Diagnosis not present

## 2015-07-24 DIAGNOSIS — Z720 Tobacco use: Secondary | ICD-10-CM | POA: Insufficient documentation

## 2015-07-24 DIAGNOSIS — Z8659 Personal history of other mental and behavioral disorders: Secondary | ICD-10-CM | POA: Diagnosis not present

## 2015-07-24 DIAGNOSIS — Z7951 Long term (current) use of inhaled steroids: Secondary | ICD-10-CM | POA: Insufficient documentation

## 2015-07-24 DIAGNOSIS — N400 Enlarged prostate without lower urinary tract symptoms: Secondary | ICD-10-CM | POA: Insufficient documentation

## 2015-07-24 DIAGNOSIS — Z590 Homelessness: Secondary | ICD-10-CM | POA: Diagnosis not present

## 2015-07-24 LAB — BASIC METABOLIC PANEL
Anion gap: 5 (ref 5–15)
BUN: 10 mg/dL (ref 6–20)
CO2: 33 mmol/L — ABNORMAL HIGH (ref 22–32)
Calcium: 9 mg/dL (ref 8.9–10.3)
Chloride: 102 mmol/L (ref 101–111)
Creatinine, Ser: 0.91 mg/dL (ref 0.61–1.24)
GFR calc Af Amer: >60 mL/min (ref >60)
GFR calc non Af Amer: >60 mL/min (ref >60)
Glucose, Bld: 126 mg/dL — ABNORMAL HIGH (ref 65–99)
Potassium: 3.8 mmol/L (ref 3.5–5.1)
Sodium: 140 mmol/L (ref 135–145)

## 2015-07-24 LAB — CBC
HCT: 43.1 % (ref 39.0–52.0)
Hemoglobin: 13.8 g/dL (ref 13.0–17.0)
MCH: 32.1 pg (ref 26.0–34.0)
MCHC: 32 g/dL (ref 30.0–36.0)
MCV: 100.2 fL — ABNORMAL HIGH (ref 78.0–100.0)
Platelets: 220 10*3/uL (ref 150–400)
RBC: 4.3 MIL/uL (ref 4.22–5.81)
RDW: 16.2 % — ABNORMAL HIGH (ref 11.5–15.5)
WBC: 13.7 10*3/uL — ABNORMAL HIGH (ref 4.0–10.5)

## 2015-07-24 LAB — TROPONIN I: Troponin I: <0.03 ng/mL (ref <0.031)

## 2015-07-24 NOTE — ED Provider Notes (Signed)
CSN: [id number]     Arrival date & time 07/24/15  2158 History  By signing my name below, I, [name] [name], attest that this documentation has been prepared under the direction and in the presence of Mancel Bale, MD. Electronically Signed: Octavia Heir, ED Scribe. 07/24/2015. 10:53 PM.    Chief Complaint  Patient presents with  . Chest Pain      The history is provided by the patient. No language interpreter was used.   HPI Comments: [name] [name] is a 69 y.o. male who has a hx of COPD, CAD, and CHF presents to the Emergency Department complaining of sudden onset, intermittent, gradual worsening shortness of breath onset 7 hours ago. Pt states he began to feel clammy and his heart began to race. He states he was using his rescue inhaler too much today which he feels did not help his symptoms at all. Pt reports having his first MI in December 2007 and states having a pace maker placed. He was treated for exacerbation of COPD 4 days ago and is currently on a taper dose of prednisone. He denies fever, chills, persistent cough, sputum production, or dizziness. There are no other known modifying factors.  Past Medical History  Diagnosis Date  . Severe aortic valve stenosis     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 at [facility]  . COPD (chronic obstructive pulmonary disease)   . Thoracic ascending aortic aneurysm     repaired 06/2014 at [facility]  . COPD (chronic obstructive pulmonary disease)   . BPH (benign prostatic hyperplasia)   . Pacemaker     battery replaced 06/2014 at [facility]  . SSS (sick sinus syndrome)     s/p PPM   . CAD (coronary artery disease)     ?MI in 2007  . Pneumothorax     traumatic 2007  . Anxiety and depression   . Psychosis   . Bipolar disorder   . CHF (congestive heart failure)   . Neuropathic pain   . Homelessness   . Tobacco abuse    Past Surgical History  Procedure Laterality Date  . Pacemaker placement  2007    battery replaced 07/04/2014  . Hiatal hernia  repair    . Knee surgery      L5-6  . Lumbar fusion    . Aortic valve replacement      porcine valve  . Cardiac surgery      ascending thoracic aneurysm repair 06/2014   Family History  Problem Relation Age of Onset  . CAD Father   . Diabetes Father   . High blood pressure Brother   . Cancer Brother    Social History  Substance Use Topics  . Smoking status: Current Some Day Smoker -- 1.00 packs/day    Types: Cigarettes  . Smokeless tobacco: None  . Alcohol Use: Yes     Comment: occ    Review of Systems  Respiratory: Positive for shortness of breath.   All other systems reviewed and are negative.     Allergies  Tiotropium bromide monohydrate and Morphine and related  Home Medications   Prior to Admission medications   Medication Sig Start Date End Date Taking? Authorizing Provider  albuterol (PROAIR HFA) 108 (90 BASE) MCG/ACT inhaler Inhale 1-2 puffs into the lungs every 6 (six) hours as needed for wheezing or shortness of breath. Patient not taking: Reported on 05/30/2015 03/08/15   Jerald Kief, MD  albuterol (PROVENTIL HFA;VENTOLIN HFA) 108 (90 BASE) MCG/ACT inhaler Inhale 2  puffs into the lungs every 6 (six) hours as needed for wheezing or shortness of breath. 05/30/15   Gayla Doss, MD  albuterol (PROVENTIL HFA;VENTOLIN HFA) 108 (90 BASE) MCG/ACT inhaler Inhale 2 puffs into the lungs every 6 (six) hours. 05/31/15   Houston Siren, MD  aspirin EC 81 MG tablet Take 81 mg by mouth daily.    Historical Provider, MD  budesonide-formoterol (SYMBICORT) 80-4.5 MCG/ACT inhaler Inhale 2 puffs into the lungs 2 (two) times daily.    Historical Provider, MD  budesonide-formoterol (SYMBICORT) 80-4.5 MCG/ACT inhaler Inhale 2 puffs into the lungs 2 (two) times daily. 05/31/15   Houston Siren, MD  docusate sodium (COLACE) 100 MG capsule Take 100 mg by mouth daily.    Historical Provider, MD  folic acid (FOLVITE) 1 MG tablet Take 1 tablet (1 mg total) by mouth daily. 10/04/14   Richarda Overlie, MD  furosemide (LASIX) 40 MG tablet Take 1 tablet (40 mg total) by mouth daily. 01/05/15   Bethann Berkshire, MD  ipratropium-albuterol (DUONEB) 0.5-2.5 (3) MG/3ML SOLN Take 3 mLs by nebulization every 4 (four) hours as needed. Patient taking differently: Take 3 mLs by nebulization every 4 (four) hours as needed (for shortness of breath).  03/08/15   Jerald Kief, MD  levalbuterol [facility] HFA) 45 MCG/ACT inhaler Inhale 1 puff into the lungs every 4 (four) hours as needed for wheezing or shortness of breath.    Historical Provider, MD  potassium chloride SA (K-DUR,KLOR-CON) 20 MEQ tablet Take 20 mEq by mouth daily.    Historical Provider, MD  predniSONE (DELTASONE) 10 MG tablet Take q day 6,5,4,3,2,1 07/25/15   Mancel Bale, MD  tamsulosin (FLOMAX) 0.4 MG CAPS capsule Take 0.4 mg by mouth at bedtime.    Historical Provider, MD  vardenafil (LEVITRA) 10 MG tablet Take 10 mg by mouth as needed for erectile dysfunction.    Historical Provider, MD   Triage vitals: BP 131/92 mmHg  Pulse 107  Temp(Src) 98.2 F (36.8 C)  Resp 22  Ht  (1.803 m)  Wt 165 lb (74.844 kg)  BMI 23.02 kg/m2  SpO2 97% Physical Exam  Constitutional: He is oriented to person, place, and time. He appears well-developed and well-nourished.  HENT:  Head: Normocephalic and atraumatic.  Right Ear: External ear normal.  Left Ear: External ear normal.  Eyes: Conjunctivae and EOM are normal. Pupils are equal, round, and reactive to light.  Neck: Normal range of motion and phonation normal. Neck supple.  Cardiovascular: Normal rate, regular rhythm and normal heart sounds.   Pulmonary/Chest: Effort normal and breath sounds normal. He exhibits no bony tenderness.  Abdominal: Soft. There is no tenderness.  Musculoskeletal: Normal range of motion.  Neurological: He is alert and oriented to person, place, and time. No cranial nerve deficit or sensory deficit. He exhibits normal muscle tone. Coordination normal.  Skin: Skin  is warm, dry and intact.  Psychiatric: He has a normal mood and affect. His behavior is normal. Judgment and thought content normal.  Nursing note and vitals reviewed.   ED Course  Procedures   Medications - No data to display  Patient Vitals for the past 24 hrs:  BP Temp Pulse Resp SpO2 Height Weight  07/24/15 2330 142/82 mmHg - 95 14 100 % - -  07/24/15 2300 149/93 mmHg - 89 18 100 % - -  07/24/15 2230 134/75 mmHg - 96 19 99 % - -  07/24/15 2200 131/92 mmHg 98.2 F (36.8 C)  107 22 97 %  (1.803 m) 165 lb (74.844 kg)    12:02 AM Reevaluation with update and discussion. After initial assessment and treatment, an updated evaluation reveals he is comfortable, no respiratory distress, fair air movement. Lungs bilaterally. No significant wheezes.Mancel Bale L    DIAGNOSTIC STUDIES: Oxygen Saturation is 97% on 3 L/min [place], normal by my interpretation.  COORDINATION OF CARE:  10:53 PM Discussed treatment plan with pt at bedside and pt agreed to plan.  Labs Review Labs Reviewed  BASIC METABOLIC PANEL - Abnormal; Notable for the following:    CO2 33 (*)    Glucose, Bld 126 (*)    All other components within normal limits  CBC - Abnormal; Notable for the following:    WBC 13.7 (*)    MCV 100.2 (*)    RDW 16.2 (*)    All other components within normal limits  TROPONIN I    Imaging Review Dg Chest Portable 1 View  07/24/2015   CLINICAL DATA:  Chest pain today.  EXAM: PORTABLE CHEST 1 VIEW  COMPARISON:  May 30, 2015  FINDINGS: The heart size and mediastinal contours are stable. Cardiac pacemaker and cardiac valvular replacement ring are unchanged. The lungs are hyperinflated. There is no focal infiltrate, pulmonary edema, or pleural effusion. The visualized skeletal structures are stable.  IMPRESSION: No active cardiopulmonary disease.  Emphysema.   Electronically Signed   By: Sherian Rein M.D.   On: 07/24/2015 23:11   I have personally reviewed and evaluated these images  and lab results as part of my medical decision-making.  His pacemaker was assessed, by telemetry, and was found to be normal. I discussed the findings with the pacemaker representative.   EKG Interpretation   Date/Time:  Monday July 24 2015 22:07:53 EDT Ventricular Rate:  99 PR Interval:  163 QRS Duration: 152 QT Interval:  394 QTC Calculation: 506 R Axis:   -84 Text Interpretation:  Since last tracing morphology appears to be V-Paced  without visible pacer spikes Confirmed by Effie Shy  MD, Mechele Collin ([id number]) on  07/24/2015 11:01:34 PM      MDM   Final diagnoses:  COPD exacerbation   COPD exacerbation, without evidence for pneumonia, heart failure or ACS. Patient continues to imbibe both marijuana and tobacco products.  Nursing Notes Reviewed/ Care Coordinated Applicable Imaging Reviewed Interpretation of Laboratory Data incorporated into ED treatment  The patient appears reasonably screened and/or stabilized for discharge and I doubt any other medical condition or other [facility] requiring further screening, evaluation, or treatment in the ED at this time prior to discharge.  Plan: Home Medications- Prednisone taper; Home Treatments- Stop smoking; return here if the recommended treatment, does not improve the symptoms; Recommended follow up- PCP prn    I personally performed the services described in this documentation, which was scribed in my presence. The recorded information has been reviewed and is accurate.     Mancel Bale, MD 07/25/15 [phone]

## 2015-07-24 NOTE — ED Notes (Addendum)
Connected patient to pacemaker transmitter, and called Public house manager for interpretation, a representative from [facility] check pacemaker and it's working fine.

## 2015-07-24 NOTE — ED Notes (Signed)
Pt c/o chest pain since 1600. Pt states he has used his rescue inhaler too much today. Pt is cursing in room and security called.

## 2015-07-25 MED ORDER — PREDNISONE 10 MG PO TABS: ORAL_TABLET | ORAL | Status: DC

## 2015-07-25 MED ORDER — BUDESONIDE-FORMOTEROL FUMARATE 80-4.5 MCG/ACT IN AERO: 2.0000 | INHALATION_SPRAY | Freq: Two times a day (BID) | RESPIRATORY_TRACT | Status: DC

## 2015-07-25 NOTE — ED Notes (Signed)
Pt escorted out by security and [employer] due to disruptive behavior.

## 2015-07-25 NOTE — Discharge Instructions (Signed)

## 2015-08-01 ENCOUNTER — Emergency Department (HOSPITAL_COMMUNITY): Payer: Medicare HMO

## 2015-08-01 ENCOUNTER — Inpatient Hospital Stay (HOSPITAL_COMMUNITY)
Admission: EM | Admit: 2015-08-01 | Discharge: 2015-08-01 | DRG: 191 | Discharge status: Against Medical Advice | Payer: Medicare HMO | Attending: Internal Medicine | Admitting: Internal Medicine

## 2015-08-01 ENCOUNTER — Encounter (HOSPITAL_COMMUNITY): Payer: Self-pay | Admitting: Emergency Medicine

## 2015-08-01 DIAGNOSIS — J441 Chronic obstructive pulmonary disease with (acute) exacerbation: Principal | ICD-10-CM | POA: Diagnosis present

## 2015-08-01 DIAGNOSIS — F302 Manic episode, severe with psychotic symptoms: Secondary | ICD-10-CM | POA: Diagnosis present

## 2015-08-01 DIAGNOSIS — Z5900 Homelessness unspecified: Secondary | ICD-10-CM

## 2015-08-01 DIAGNOSIS — Z809 Family history of malignant neoplasm, unspecified: Secondary | ICD-10-CM

## 2015-08-01 DIAGNOSIS — I252 Old myocardial infarction: Secondary | ICD-10-CM | POA: Diagnosis not present

## 2015-08-01 DIAGNOSIS — Z952 Presence of prosthetic heart valve: Secondary | ICD-10-CM

## 2015-08-01 DIAGNOSIS — E785 Hyperlipidemia, unspecified: Secondary | ICD-10-CM | POA: Diagnosis present

## 2015-08-01 DIAGNOSIS — I5022 Chronic systolic (congestive) heart failure: Secondary | ICD-10-CM | POA: Diagnosis not present

## 2015-08-01 DIAGNOSIS — I5042 Chronic combined systolic (congestive) and diastolic (congestive) heart failure: Secondary | ICD-10-CM | POA: Diagnosis present

## 2015-08-01 DIAGNOSIS — I2510 Atherosclerotic heart disease of native coronary artery without angina pectoris: Secondary | ICD-10-CM | POA: Diagnosis present

## 2015-08-01 DIAGNOSIS — Z950 Presence of cardiac pacemaker: Secondary | ICD-10-CM | POA: Diagnosis not present

## 2015-08-01 DIAGNOSIS — Z833 Family history of diabetes mellitus: Secondary | ICD-10-CM

## 2015-08-01 DIAGNOSIS — Z8249 Family history of ischemic heart disease and other diseases of the circulatory system: Secondary | ICD-10-CM

## 2015-08-01 DIAGNOSIS — R0600 Dyspnea, unspecified: Secondary | ICD-10-CM

## 2015-08-01 DIAGNOSIS — R0789 Other chest pain: Secondary | ICD-10-CM

## 2015-08-01 DIAGNOSIS — Z720 Tobacco use: Secondary | ICD-10-CM | POA: Diagnosis present

## 2015-08-01 DIAGNOSIS — I350 Nonrheumatic aortic (valve) stenosis: Secondary | ICD-10-CM

## 2015-08-01 DIAGNOSIS — F17210 Nicotine dependence, cigarettes, uncomplicated: Secondary | ICD-10-CM | POA: Diagnosis present

## 2015-08-01 DIAGNOSIS — R0602 Shortness of breath: Secondary | ICD-10-CM | POA: Diagnosis present

## 2015-08-01 LAB — CBC
HCT: 44.5 % (ref 39.0–52.0)
Hemoglobin: 14.2 g/dL (ref 13.0–17.0)
MCH: 32.8 pg (ref 26.0–34.0)
MCHC: 31.9 g/dL (ref 30.0–36.0)
MCV: 102.8 fL — ABNORMAL HIGH (ref 78.0–100.0)
Platelets: 160 10*3/uL (ref 150–400)
RBC: 4.33 MIL/uL (ref 4.22–5.81)
RDW: 15.8 % — ABNORMAL HIGH (ref 11.5–15.5)
WBC: 14.3 10*3/uL — ABNORMAL HIGH (ref 4.0–10.5)

## 2015-08-01 LAB — TROPONIN I
Troponin I: <0.03 ng/mL (ref <0.031)
Troponin I: <0.03 ng/mL

## 2015-08-01 LAB — BASIC METABOLIC PANEL WITH GFR
Anion gap: 6 (ref 5–15)
BUN: 17 mg/dL (ref 6–20)
CO2: 33 mmol/L — ABNORMAL HIGH (ref 22–32)
Calcium: 8.5 mg/dL — ABNORMAL LOW (ref 8.9–10.3)
Chloride: 102 mmol/L (ref 101–111)
Creatinine, Ser: 0.83 mg/dL (ref 0.61–1.24)
GFR calc Af Amer: >60 mL/min
GFR calc non Af Amer: >60 mL/min
Glucose, Bld: 121 mg/dL — ABNORMAL HIGH (ref 65–99)
Potassium: 4.1 mmol/L (ref 3.5–5.1)
Sodium: 141 mmol/L (ref 135–145)

## 2015-08-01 MED ORDER — DOCUSATE SODIUM 100 MG PO CAPS
100.0000 mg | ORAL_CAPSULE | Freq: Every day | ORAL | Status: DC
  Administered 2015-08-01: 100 mg via ORAL
  Filled 2015-08-01: qty 1

## 2015-08-01 MED ORDER — BUDESONIDE-FORMOTEROL FUMARATE 80-4.5 MCG/ACT IN AERO
2.0000 | INHALATION_SPRAY | Freq: Two times a day (BID) | RESPIRATORY_TRACT | Status: DC
  Filled 2015-08-01: qty 6.9

## 2015-08-01 MED ORDER — ALBUTEROL (5 MG/ML) CONTINUOUS INHALATION SOLN
10.0000 mg/h | INHALATION_SOLUTION | Freq: Once | RESPIRATORY_TRACT | Status: AC
  Administered 2015-08-01: 10 mg/h via RESPIRATORY_TRACT
  Filled 2015-08-01: qty 20

## 2015-08-01 MED ORDER — ENOXAPARIN SODIUM 40 MG/0.4ML ~~LOC~~ SOLN
40.0000 mg | SUBCUTANEOUS | Status: DC
Start: 2015-08-01 — End: 2015-08-02
  Filled 2015-08-01: qty 0.4

## 2015-08-01 MED ORDER — FUROSEMIDE 40 MG PO TABS
40.0000 mg | ORAL_TABLET | Freq: Every day | ORAL | Status: DC
  Administered 2015-08-01: 40 mg via ORAL
  Filled 2015-08-01: qty 1

## 2015-08-01 MED ORDER — ASPIRIN EC 81 MG PO TBEC
81.0000 mg | DELAYED_RELEASE_TABLET | Freq: Every day | ORAL | Status: DC
  Administered 2015-08-01: 81 mg via ORAL
  Filled 2015-08-01: qty 1

## 2015-08-01 MED ORDER — ACETAMINOPHEN 650 MG RE SUPP: 650.0000 mg | Freq: Four times a day (QID) | RECTAL | Status: DC | PRN

## 2015-08-01 MED ORDER — ALBUTEROL SULFATE (2.5 MG/3ML) 0.083% IN NEBU
2.5000 mg | INHALATION_SOLUTION | Freq: Four times a day (QID) | RESPIRATORY_TRACT | Status: DC
  Administered 2015-08-01: 2.5 mg via RESPIRATORY_TRACT
  Filled 2015-08-01: qty 3

## 2015-08-01 MED ORDER — METHYLPREDNISOLONE SODIUM SUCC 125 MG IJ SOLR
125.0000 mg | Freq: Once | INTRAMUSCULAR | Status: AC
Start: 2015-08-01 — End: 2015-08-01
  Administered 2015-08-01: 125 mg via INTRAVENOUS
  Filled 2015-08-01: qty 2

## 2015-08-01 MED ORDER — LORAZEPAM 1 MG PO TABS: 1.0000 mg | ORAL_TABLET | Freq: Four times a day (QID) | ORAL | Status: DC | PRN

## 2015-08-01 MED ORDER — IPRATROPIUM-ALBUTEROL 0.5-2.5 (3) MG/3ML IN SOLN: 3.0000 mL | Freq: Once | RESPIRATORY_TRACT | Status: DC

## 2015-08-01 MED ORDER — FOLIC ACID 1 MG PO TABS
1.0000 mg | ORAL_TABLET | Freq: Every day | ORAL | Status: DC
  Administered 2015-08-01: 1 mg via ORAL
  Filled 2015-08-01: qty 1

## 2015-08-01 MED ORDER — BUDESONIDE-FORMOTEROL FUMARATE 160-4.5 MCG/ACT IN AERO: 2.0000 | INHALATION_SPRAY | Freq: Two times a day (BID) | RESPIRATORY_TRACT | Status: DC

## 2015-08-01 MED ORDER — ONDANSETRON HCL 4 MG/2ML IJ SOLN: 4.0000 mg | Freq: Four times a day (QID) | INTRAMUSCULAR | Status: DC | PRN

## 2015-08-01 MED ORDER — TAMSULOSIN HCL 0.4 MG PO CAPS
0.4000 mg | ORAL_CAPSULE | Freq: Every day | ORAL | Status: DC
  Filled 2015-08-01: qty 1

## 2015-08-01 MED ORDER — SODIUM CHLORIDE 0.9 % IJ SOLN: 3.0000 mL | Freq: Two times a day (BID) | INTRAMUSCULAR | Status: DC

## 2015-08-01 MED ORDER — IPRATROPIUM BROMIDE 0.02 % IN SOLN
0.5000 mg | RESPIRATORY_TRACT | Status: DC | PRN
  Administered 2015-08-01: 0.5 mg via RESPIRATORY_TRACT
  Filled 2015-08-01: qty 2.5

## 2015-08-01 MED ORDER — METHYLPREDNISOLONE SODIUM SUCC 40 MG IJ SOLR
40.0000 mg | Freq: Four times a day (QID) | INTRAMUSCULAR | Status: DC
  Administered 2015-08-01: 40 mg via INTRAVENOUS
  Filled 2015-08-01 (×2): qty 1

## 2015-08-01 MED ORDER — ONDANSETRON HCL 4 MG PO TABS: 4.0000 mg | ORAL_TABLET | Freq: Four times a day (QID) | ORAL | Status: DC | PRN

## 2015-08-01 MED ORDER — LEVOFLOXACIN IN D5W 750 MG/150ML IV SOLN
750.0000 mg | INTRAVENOUS | Status: DC
  Administered 2015-08-01: 750 mg via INTRAVENOUS
  Filled 2015-08-01: qty 150

## 2015-08-01 MED ORDER — BUDESONIDE-FORMOTEROL FUMARATE 160-4.5 MCG/ACT IN AERO
2.0000 | INHALATION_SPRAY | Freq: Two times a day (BID) | RESPIRATORY_TRACT | Status: DC
  Administered 2015-08-01: 2 via RESPIRATORY_TRACT
  Filled 2015-08-01: qty 6

## 2015-08-01 MED ORDER — ACETAMINOPHEN 325 MG PO TABS: 650.0000 mg | ORAL_TABLET | Freq: Four times a day (QID) | ORAL | Status: DC | PRN

## 2015-08-01 MED ORDER — ALBUTEROL SULFATE (2.5 MG/3ML) 0.083% IN NEBU
2.5000 mg | INHALATION_SOLUTION | RESPIRATORY_TRACT | Status: DC | PRN
  Administered 2015-08-01: 2.5 mg via RESPIRATORY_TRACT
  Filled 2015-08-01: qty 3

## 2015-08-01 NOTE — ED Notes (Signed)
Hospitalist at bedside at this time 

## 2015-08-01 NOTE — ED Notes (Signed)
Report given to Alona Bene, RN for room 327.

## 2015-08-01 NOTE — ED Notes (Signed)
Respiratory overhead paged to ED

## 2015-08-01 NOTE — ED Provider Notes (Signed)
CSN: [id number]     Arrival date & time 08/01/15  1039 History  By signing my name below, I, Marica Otter, attest that this documentation has been prepared under the direction and in the presence of Blane Ohara, MD. Electronically Signed: Marica Otter, ED Scribe. 08/01/2015. 12:14 PM.   Chief Complaint  Patient presents with  . Chest Pain   The history is provided by the patient. No language interpreter was used.   PCP: Daryel November, MD HPI Comments: [name] [name] is a 69 y.o. male, with PMHx noted below including MI (2007), pacemaker placement, COPD, CAD, CHF, daily tobacco use (1ppd), and homelessness, who presents to the Emergency Department complaining of ongoing, atraumatic, intermittent, 10/10, sharp, right sided chest pain radiating to bilateral arms with associated SOB onset 8 days ago. Pt reports he was seen at this ED and [place] this past week for the same Sx. Pt denies home O2, stent placement, fever, chills, Hx of blood clot, surgeries past three months, Tx for cancer, abd pain, n/v/d, recent weight gain, leg swelling, or any other Sx at this time.  Past Medical History  Diagnosis Date  . Severe aortic valve stenosis     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 at [facility]  . COPD (chronic obstructive pulmonary disease) (HCC)   . Thoracic ascending aortic aneurysm (HCC)     repaired 06/2014 at [facility]  . COPD (chronic obstructive pulmonary disease) (HCC)   . BPH (benign prostatic hyperplasia)   . Pacemaker     battery replaced 06/2014 at [facility]  . SSS (sick sinus syndrome) (HCC)     s/p PPM   . CAD (coronary artery disease)     ?MI in 2007  . Pneumothorax     traumatic 2007  . Anxiety and depression   . Psychosis   . Bipolar disorder (HCC)   . CHF (congestive heart failure) (HCC)   . Neuropathic pain   . Homelessness   . Tobacco abuse    Past Surgical History  Procedure Laterality Date  . Pacemaker placement  2007    battery replaced 07/04/2014  . Hiatal hernia repair     . Knee surgery      L5-6  . Lumbar fusion    . Aortic valve replacement      porcine valve  . Cardiac surgery      ascending thoracic aneurysm repair 06/2014   Family History  Problem Relation Age of Onset  . CAD Father   . Diabetes Father   . High blood pressure Brother   . Cancer Brother    Social History  Substance Use Topics  . Smoking status: Current Some Day Smoker -- 1.00 packs/day    Types: Cigarettes  . Smokeless tobacco: None  . Alcohol Use: Yes     Comment: occ    Review of Systems  Constitutional: Negative for fever, chills and unexpected weight change.  Respiratory: Positive for shortness of breath.   Cardiovascular: Positive for chest pain (radiating to bilateral arms ). Negative for leg swelling.  Gastrointestinal: Negative for nausea, vomiting, abdominal pain and diarrhea.  All other systems reviewed and are negative.  Allergies  Tiotropium bromide monohydrate and Morphine and related  Home Medications   Prior to Admission medications   Medication Sig Start Date End Date Taking? Authorizing Provider  albuterol (PROAIR HFA) 108 (90 BASE) MCG/ACT inhaler Inhale 1-2 puffs into the lungs every 6 (six) hours as needed for wheezing or shortness of breath. Patient not taking: Reported on  05/30/2015 03/08/15   Jerald Kief, MD  albuterol (PROVENTIL HFA;VENTOLIN HFA) 108 (90 BASE) MCG/ACT inhaler Inhale 2 puffs into the lungs every 6 (six) hours as needed for wheezing or shortness of breath. 05/30/15   Gayla Doss, MD  albuterol (PROVENTIL HFA;VENTOLIN HFA) 108 (90 BASE) MCG/ACT inhaler Inhale 2 puffs into the lungs every 6 (six) hours. 05/31/15   Houston Siren, MD  aspirin EC 81 MG tablet Take 81 mg by mouth daily.    Historical Provider, MD  budesonide-formoterol (SYMBICORT) 80-4.5 MCG/ACT inhaler Inhale 2 puffs into the lungs 2 (two) times daily. 07/25/15   Mancel Bale, MD  docusate sodium (COLACE) 100 MG capsule Take 100 mg by mouth daily.    Historical  Provider, MD  folic acid (FOLVITE) 1 MG tablet Take 1 tablet (1 mg total) by mouth daily. 10/04/14   Richarda Overlie, MD  furosemide (LASIX) 40 MG tablet Take 1 tablet (40 mg total) by mouth daily. 01/05/15   Bethann Berkshire, MD  ipratropium-albuterol (DUONEB) 0.5-2.5 (3) MG/3ML SOLN Take 3 mLs by nebulization every 4 (four) hours as needed. Patient taking differently: Take 3 mLs by nebulization every 4 (four) hours as needed (for shortness of breath).  03/08/15   Jerald Kief, MD  levalbuterol [facility] HFA) 45 MCG/ACT inhaler Inhale 1 puff into the lungs every 4 (four) hours as needed for wheezing or shortness of breath.    Historical Provider, MD  potassium chloride SA (K-DUR,KLOR-CON) 20 MEQ tablet Take 20 mEq by mouth daily.    Historical Provider, MD  predniSONE (DELTASONE) 10 MG tablet Take q day 6,5,4,3,2,1 07/25/15   Mancel Bale, MD  tamsulosin (FLOMAX) 0.4 MG CAPS capsule Take 0.4 mg by mouth at bedtime.    Historical Provider, MD  vardenafil (LEVITRA) 10 MG tablet Take 10 mg by mouth as needed for erectile dysfunction.    Historical Provider, MD   Triage Vitals: BP 100/87 mmHg  Pulse 84  Resp 22  Ht 5\' 10"  (1.778 m)  Wt 165 lb (74.844 kg)  BMI 23.68 kg/m2  SpO2 97% Physical Exam  Constitutional: He is oriented to person, place, and time. He appears well-developed and well-nourished.  HENT:  Head: Normocephalic and atraumatic.  Eyes: EOM are normal.  Neck: Normal range of motion.  Cardiovascular: Normal rate and regular rhythm.   Pulmonary/Chest: He has wheezes (bilateral ).  Retractions subcostal. Decreased bilateral air movement.   Abdominal: Soft. He exhibits no distension. There is no tenderness.  Musculoskeletal: Normal range of motion. He exhibits edema (BLE).  Neurological: He is alert and oriented to person, place, and time.  Skin: Skin is warm and dry.  Nursing note and vitals reviewed.  ED Course  Procedures (including critical care time) DIAGNOSTIC STUDIES: Oxygen  Saturation is 97% on 2L/min.   COORDINATION OF CARE: 12:10 PM: Discussed treatment plan which includes meds, imaging and labs with pt at bedside; patient verbalizes understanding and agrees with treatment plan.  Labs Review Labs Reviewed  BASIC METABOLIC PANEL - Abnormal; Notable for the following:    CO2 33 (*)    Glucose, Bld 121 (*)    Calcium 8.5 (*)    All other components within normal limits  CBC - Abnormal; Notable for the following:    WBC 14.3 (*)    MCV 102.8 (*)    RDW 15.8 (*)    All other components within normal limits  TROPONIN I    Imaging Review Dg Chest Portable 1 View  08/01/2015   CLINICAL DATA:  Right-sided stabbing chest pain with bilateral arm pain and shortness of breath for 1 week. History of myocardial infarction and COPD. Initial encounter.  EXAM: PORTABLE CHEST 1 VIEW  COMPARISON:  07/24/2015 and 05/30/2015.  FINDINGS: 1056 hours. Two views obtained. Left subclavian pacemaker leads appear unchanged within the right atrium and right ventricle. The heart size and mediastinal contours are stable status post median sternotomy and aortic valve replacement. The lungs are hyperinflated but clear. There is no pleural effusion or pneumothorax. Surgical clips are present within the right axilla. There are postsurgical changes within the right chest wall.  IMPRESSION: Stable postoperative chest.  No acute cardiopulmonary process.   Electronically Signed   By: Carey Bullocks M.D.   On: 08/01/2015 11:18   I have personally reviewed and evaluated these images and lab results as part of my medical decision-making.   EKG Interpretation   Date/Time:  Tuesday August 01 2015 10:51:52 EDT Ventricular Rate:  88 PR Interval:  182 QRS Duration: 158 QT Interval:  436 QTC Calculation: 528 R Axis:   -87 Text Interpretation:  Sinus or ectopic atrial rhythm Nonspecific IVCD with  LAD Left ventricular hypertrophy Overall similar to previous, artifact  Confirmed by Dedrick Heffner  MD,  [place] [employer] ([id number]) on 08/01/2015 12:00:50 PM      MDM   Final diagnoses:  None   Patient with complicated medical history presents with worsening shortness of breath and chest discomfort. Patient has significant COPD history denies home oxygen use. Patient has increased work of breathing x-ray wheezing bilateral and decreased air movement. Clinical concern for acute COPD exacerbation. Screening cardiac performed with cardiac history troponin negative, EKG artifact and overall similar to previous. Continuous neb ordered. [facility] tri[facility] hospitalist for telemetry admission.  The patients results and plan were reviewed and discussed.   Any x-rays performed were independently reviewed by myself.   Differential diagnosis were considered with the presenting HPI.  Medications  methylPREDNISolone sodium succinate (SOLU-MEDROL) 125 mg/2 mL injection 125 mg (not administered)  albuterol (PROVENTIL,VENTOLIN) solution continuous neb (not administered)  ipratropium (ATROVENT) nebulizer solution 0.5 mg (not administered)    Filed Vitals:   08/01/15 1058 08/01/15 1100 08/01/15 1130  BP: 100/87 120/89 142/84  Pulse: 84 83   Resp: Height:  (1.778 m)    Weight: 165 lb (74.844 kg)    SpO2: 97% 97% 96%    Final diagnoses:  Acute exacerbation of chronic obstructive pulmonary disease (COPD) (HCC)  Other chest pain  Dyspnea    Admission/ observation were discussed with the admitting physician, patient and/or family and they are comfortable with the plan.    Blane Ohara, MD 08/04/15 [phone]
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
## 2015-08-01 NOTE — ED Notes (Signed)
Pt c/o RT sided, stabbing CP radiating to bilateral arms and SOB that began 7 days ago. Pt has been seen here and at [facility] in the past week for same complaint. Pt has pacemaker. Pt hx of MI.

## 2015-08-01 NOTE — H&P (Signed)
[facility] History and Physical  [name] [name] UJW:[id number] DOB: 1946/06/23    PCP:   Daryel November, MD   Chief Complaint: chest pain and SOB.   HPI:  [name] [name] is a 69 y.o. male, with PMHx noted below including MI (2007), pacemaker placement, COPD, CAD, CHF, daily tobacco use (1ppd), and homelessness, who presented again with intermittent chest tightness over a week now.  He also has SOB, wheezing, coughs, but no fever or chills.  He continued to smoke. Pt reports he was seen at this ED and [place] this past week for the same Sx. Pt denies home O2, stent placement, fever, chills, Hx of blood clot, surgeries past three months, Tx for cancer, abd pain, n/v/d, recent weight gain, leg swelling, or any other Sx at this time.  Evaluation in the ER with negative CXR, WBC of 14K, and normal renal Fx.  His initial troponin was negative and his EKG showed LBBB.  He was given an hour neb, along with IV Steroids, and hospitalist was asked to admit him for COPD exacerbation.   Rewiew of Systems:  Constitutional: Negative for malaise, fever and chills. No significant weight loss or weight gain Eyes: Negative for eye pain, redness and discharge, diplopia, visual changes, or flashes of light. ENMT: Negative for ear pain, hoarseness, nasal congestion, sinus pressure and sore throat. No headaches; tinnitus, drooling, or problem swallowing. Cardiovascular: Negative for  palpitations, diaphoresis,  and peripheral edema. ; No orthopnea, PND Respiratory: Negative for cough, hemoptysis, wheezing and stridor.  Gastrointestinal: Negative for nausea, vomiting, diarrhea, constipation, abdominal pain, melena, blood in stool, hematemesis, jaundice and rectal bleeding.    Genitourinary: Negative for frequency, dysuria, incontinence,flank pain and hematuria; Musculoskeletal: Negative for back pain and neck pain. Negative for swelling and trauma.;  Skin: . Negative for pruritus, rash, abrasions, bruising  and skin lesion.; ulcerations Neuro: Negative for headache, lightheadedness and neck stiffness. Negative for weakness, altered level of consciousness , altered mental status, extremity weakness, burning feet, involuntary movement, seizure and syncope.  Psych: negative for anxiety, depression, insomnia, tearfulness, panic attacks, hallucinations, paranoia, suicidal or homicidal ideation    Past Medical History  Diagnosis Date  . Severe aortic valve stenosis     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 at [facility]  . COPD (chronic obstructive pulmonary disease) (HCC)   . Thoracic ascending aortic aneurysm (HCC)     repaired 06/2014 at [facility]  . COPD (chronic obstructive pulmonary disease) (HCC)   . BPH (benign prostatic hyperplasia)   . Pacemaker     battery replaced 06/2014 at [facility]  . SSS (sick sinus syndrome) (HCC)     s/p PPM   . CAD (coronary artery disease)     ?MI in 2007  . Pneumothorax     traumatic 2007  . Anxiety and depression   . Psychosis   . Bipolar disorder (HCC)   . CHF (congestive heart failure) (HCC)   . Neuropathic pain   . Homelessness   . Tobacco abuse     Past Surgical History  Procedure Laterality Date  . Pacemaker placement  2007    battery replaced 07/04/2014  . Hiatal hernia repair    . Knee surgery      L5-6  . Lumbar fusion    . Aortic valve replacement      porcine valve  . Cardiac surgery      ascending thoracic aneurysm repair 06/2014    Medications:  HOME MEDS: Prior to Admission medications   Medication  Sig Start Date End Date Taking? Authorizing Provider  albuterol (PROVENTIL) (2.5 MG/3ML) 0.083% nebulizer solution Inhale 3 mLs into the lungs every 8 (eight) hours as needed. Shortness of breath 07/18/15  Yes Historical Provider, MD  docusate sodium (COLACE) 100 MG capsule Take 100 mg by mouth daily.   Yes Historical Provider, MD  folic acid (FOLVITE) 1 MG tablet Take 1 tablet (1 mg total) by mouth daily. 10/04/14  Yes Richarda Overlie, MD   furosemide (LASIX) 40 MG tablet Take 1 tablet (40 mg total) by mouth daily. 01/05/15  Yes Bethann Berkshire, MD  ipratropium-albuterol (DUONEB) 0.5-2.5 (3) MG/3ML SOLN Take 3 mLs by nebulization every 4 (four) hours as needed. Patient taking differently: Take 3 mLs by nebulization every 4 (four) hours as needed (for shortness of breath).  03/08/15  Yes Jerald Kief, MD  levalbuterol [facility] HFA) 45 MCG/ACT inhaler Inhale 1 puff into the lungs every 4 (four) hours as needed for wheezing or shortness of breath.   Yes Historical Provider, MD  potassium chloride SA (K-DUR,KLOR-CON) 20 MEQ tablet Take 20 mEq by mouth daily.   Yes Historical Provider, MD  SYMBICORT 160-4.5 MCG/ACT inhaler Inhale 2 puffs into the lungs 2 (two) times daily. 07/14/15  Yes Historical Provider, MD  tamsulosin (FLOMAX) 0.4 MG CAPS capsule Take 0.4 mg by mouth at bedtime.   Yes Historical Provider, MD  albuterol (PROAIR HFA) 108 (90 BASE) MCG/ACT inhaler Inhale 1-2 puffs into the lungs every 6 (six) hours as needed for wheezing or shortness of breath. Patient not taking: Reported on 05/30/2015 03/08/15   Jerald Kief, MD  albuterol (PROVENTIL HFA;VENTOLIN HFA) 108 (90 BASE) MCG/ACT inhaler Inhale 2 puffs into the lungs every 6 (six) hours as needed for wheezing or shortness of breath. Patient not taking: Reported on 08/01/2015 05/30/15   Gayla Doss, MD  albuterol (PROVENTIL HFA;VENTOLIN HFA) 108 (90 BASE) MCG/ACT inhaler Inhale 2 puffs into the lungs every 6 (six) hours. 05/31/15   Houston Siren, MD  aspirin EC 81 MG tablet Take 81 mg by mouth daily.    Historical Provider, MD  budesonide-formoterol (SYMBICORT) 80-4.5 MCG/ACT inhaler Inhale 2 puffs into the lungs 2 (two) times daily. Patient not taking: Reported on 08/01/2015 07/25/15   Mancel Bale, MD  predniSONE (DELTASONE) 10 MG tablet Take q day 6,5,4,3,2,1 Patient not taking: Reported on 08/01/2015 07/25/15   Mancel Bale, MD  vardenafil (LEVITRA) 10 MG tablet Take 10 mg by  mouth as needed for erectile dysfunction.    Historical Provider, MD     Allergies:  Allergies  Allergen Reactions  . Tiotropium Bromide Monohydrate Nausea And Vomiting, Other (See Comments) and Nausea Only    Tremors Reaction:  Tremors  . Morphine And Related Other (See Comments)    Reaction:  Hallucinations     Social History:   reports that he has been smoking Cigarettes.  He has been smoking about 1.00 pack per day. He does not have any smokeless tobacco history on file. He reports that he drinks alcohol. He reports that he uses illicit drugs (Marijuana).  Family History: Family History  Problem Relation Age of Onset  . CAD Father   . Diabetes Father   . High blood pressure Brother   . Cancer Brother      Physical Exam: Filed Vitals:   08/01/15 1246 08/01/15 1322 08/01/15 1330 08/01/15 1400  BP:  122/61 104/74 120/85  Pulse:  85 92 86  Resp:  Height:  Weight:      SpO2: 97% 100% 90% 100%   Blood pressure 120/85, pulse 86, resp. rate 13, height  (1.778 m), weight 74.844 kg (165 lb), SpO2 100 %.  GEN:  Pleasant  patient lying in the stretcher in no acute distress; cooperative with exam. PSYCH:  alert and oriented x4; does not appear anxious or depressed; affect is appropriate. HEENT: Mucous membranes pink and anicteric; PERRLA; EOM intact; no cervical lymphadenopathy nor thyromegaly or carotid bruit; no JVD; There were no stridor. Neck is very supple. Breasts:: Not examined CHEST WALL: No tenderness, PPM on the lateral  CHEST: Normal respiration, he has bilateral wheezing, no rales.  HEART: Regular rate and rhythm.  There are no murmur, rub, or gallops.   BACK: No kyphosis or scoliosis; no CVA tenderness ABDOMEN: soft and non-tender; no masses, no organomegaly, normal abdominal bowel sounds; no pannus; no intertriginous candida. There is no rebound and no distention. Rectal Exam: Not done EXTREMITIES: No bone or joint deformity; age-appropriate  arthropathy of the hands and knees; no edema; no ulcerations.  There is no calf tenderness. Genitalia: not examined PULSES: 2+ and symmetric SKIN: Normal hydration no rash or ulceration CNS: Cranial nerves 2-12 grossly intact no focal lateralizing neurologic deficit.  Speech is fluent; uvula elevated with phonation, facial symmetry and tongue midline. DTR are normal bilaterally, cerebella exam is intact, barbinski is negative and strengths are equaled bilaterally.  No sensory loss.   Labs on Admission:  Basic Metabolic Panel:  Recent Labs Lab 08/01/15 1100  NA 141  K 4.1  CL 102  CO2 33*  GLUCOSE 121*  BUN 17  CREATININE 0.83  CALCIUM 8.5*   CBC:  Recent Labs Lab 08/01/15 1100  WBC 14.3*  HGB 14.2  HCT 44.5  MCV 102.8*  PLT 160   Cardiac Enzymes:  Recent Labs Lab 08/01/15 1100  TROPONINI <0.03   Radiological Exams on Admission: Dg Chest Portable 1 View  08/01/2015   CLINICAL DATA:  Right-sided stabbing chest pain with bilateral arm pain and shortness of breath for 1 week. History of myocardial infarction and COPD. Initial encounter.  EXAM: PORTABLE CHEST 1 VIEW  COMPARISON:  07/24/2015 and 05/30/2015.  FINDINGS: 1056 hours. Two views obtained. Left subclavian pacemaker leads appear unchanged within the right atrium and right ventricle. The heart size and mediastinal contours are stable status post median sternotomy and aortic valve replacement. The lungs are hyperinflated but clear. There is no pleural effusion or pneumothorax. Surgical clips are present within the right axilla. There are postsurgical changes within the right chest wall.  IMPRESSION: Stable postoperative chest.  No acute cardiopulmonary process.   Electronically Signed   By: Carey Bullocks M.D.   On: 08/01/2015 11:18    Assessment/Plan Present on Admission:  . COPD with acute exacerbation (HCC) . Chronic systolic CHF (congestive heart failure) (HCC) . Chronic combined systolic and diastolic CHF  (congestive heart failure) (HCC) . CAD (coronary artery disease) . Bipolar I disorder, single manic episode, severe, with psychosis (HCC) . Tobacco abuse . HLD (hyperlipidemia) . COPD exacerbation (HCC)  PLAN:   Will admit him for COPD exacerbation.  Will give Levaquin as antibiotics, along with IV Steroids, and nebs.  Will cycle his troponins though I don't think he had an ACS.  I have continued his home meds.   He is stable, full code, and will be admitted to telemetry.  He has hx of CHF, so we will be careful with his fluid status.  Other plans as per orders.  Code Status: FULL Unk Lightning, MD. [facility] Pager [phone] 7pm to 7am.  08/01/2015, 2:24 PM

## 2015-08-01 NOTE — Discharge Instructions (Signed)
If you were given medicines take as directed.  If you are on coumadin or contraceptives realize their levels and effectiveness is altered by many different medicines.  If you have any reaction (rash, tongues swelling, other) to the medicines stop taking and see a physician.    If your blood pressure was elevated in the ER make sure you follow up for management with a primary doctor or return for chest pain, shortness of breath or stroke symptoms.  Please follow up as directed and return to the ER or see a physician for new or worsening symptoms.  Thank you. Filed Vitals:   08/01/15 1058 08/01/15 1100 08/01/15 1130  BP: 100/87 120/89 142/84  Pulse: 84 83   Resp: Height:  (1.778 m)    Weight: 165 lb (74.844 kg)    SpO2: 97% 97% 96%

## 2015-08-02 ENCOUNTER — Observation Stay (HOSPITAL_COMMUNITY)
Admission: EM | Admit: 2015-08-02 | Discharge: 2015-08-02 | Disposition: A | Discharge status: Home / Self Care | Payer: Medicare HMO | Attending: Internal Medicine | Admitting: Internal Medicine

## 2015-08-02 ENCOUNTER — Encounter (HOSPITAL_COMMUNITY): Payer: Self-pay | Admitting: Emergency Medicine

## 2015-08-02 DIAGNOSIS — J441 Chronic obstructive pulmonary disease with (acute) exacerbation: Secondary | ICD-10-CM | POA: Diagnosis not present

## 2015-08-02 DIAGNOSIS — Z7982 Long term (current) use of aspirin: Secondary | ICD-10-CM | POA: Insufficient documentation

## 2015-08-02 DIAGNOSIS — I5042 Chronic combined systolic (congestive) and diastolic (congestive) heart failure: Secondary | ICD-10-CM | POA: Diagnosis present

## 2015-08-02 DIAGNOSIS — Z950 Presence of cardiac pacemaker: Secondary | ICD-10-CM | POA: Insufficient documentation

## 2015-08-02 DIAGNOSIS — I2510 Atherosclerotic heart disease of native coronary artery without angina pectoris: Secondary | ICD-10-CM | POA: Diagnosis not present

## 2015-08-02 DIAGNOSIS — F319 Bipolar disorder, unspecified: Secondary | ICD-10-CM | POA: Diagnosis not present

## 2015-08-02 DIAGNOSIS — J449 Chronic obstructive pulmonary disease, unspecified: Secondary | ICD-10-CM | POA: Diagnosis not present

## 2015-08-02 DIAGNOSIS — I350 Nonrheumatic aortic (valve) stenosis: Secondary | ICD-10-CM | POA: Diagnosis not present

## 2015-08-02 DIAGNOSIS — Z720 Tobacco use: Secondary | ICD-10-CM | POA: Diagnosis not present

## 2015-08-02 DIAGNOSIS — N400 Enlarged prostate without lower urinary tract symptoms: Secondary | ICD-10-CM | POA: Insufficient documentation

## 2015-08-02 DIAGNOSIS — Z79899 Other long term (current) drug therapy: Secondary | ICD-10-CM | POA: Insufficient documentation

## 2015-08-02 DIAGNOSIS — I495 Sick sinus syndrome: Secondary | ICD-10-CM | POA: Insufficient documentation

## 2015-08-02 DIAGNOSIS — Z5900 Homelessness unspecified: Secondary | ICD-10-CM

## 2015-08-02 DIAGNOSIS — R0602 Shortness of breath: Secondary | ICD-10-CM | POA: Diagnosis present

## 2015-08-02 DIAGNOSIS — R0902 Hypoxemia: Secondary | ICD-10-CM

## 2015-08-02 MED ORDER — METHYLPREDNISOLONE SODIUM SUCC 125 MG IJ SOLR
80.0000 mg | Freq: Once | INTRAMUSCULAR | Status: AC
  Administered 2015-08-02: 80 mg via INTRAVENOUS
  Filled 2015-08-02: qty 2

## 2015-08-02 MED ORDER — ALBUTEROL SULFATE (2.5 MG/3ML) 0.083% IN NEBU: 2.5000 mg | INHALATION_SOLUTION | Freq: Four times a day (QID) | RESPIRATORY_TRACT | Status: DC

## 2015-08-02 MED ORDER — SODIUM CHLORIDE 0.9 % IJ SOLN
3.0000 mL | Freq: Two times a day (BID) | INTRAMUSCULAR | Status: DC
  Administered 2015-08-02: 3 mL via INTRAVENOUS

## 2015-08-02 MED ORDER — PREDNISONE 10 MG PO TABS: ORAL_TABLET | ORAL | Status: DC

## 2015-08-02 MED ORDER — FUROSEMIDE 40 MG PO TABS
40.0000 mg | ORAL_TABLET | Freq: Every day | ORAL | Status: DC
  Administered 2015-08-02: 40 mg via ORAL
  Filled 2015-08-02: qty 1

## 2015-08-02 MED ORDER — ONDANSETRON HCL 4 MG/2ML IJ SOLN: 4.0000 mg | Freq: Four times a day (QID) | INTRAMUSCULAR | Status: DC | PRN

## 2015-08-02 MED ORDER — ACETAMINOPHEN 325 MG PO TABS: 650.0000 mg | ORAL_TABLET | Freq: Four times a day (QID) | ORAL | Status: DC | PRN

## 2015-08-02 MED ORDER — IPRATROPIUM-ALBUTEROL 0.5-2.5 (3) MG/3ML IN SOLN
3.0000 mL | Freq: Four times a day (QID) | RESPIRATORY_TRACT | Status: DC
  Administered 2015-08-02: 3 mL via RESPIRATORY_TRACT
  Filled 2015-08-02: qty 3

## 2015-08-02 MED ORDER — ONDANSETRON HCL 4 MG PO TABS: 4.0000 mg | ORAL_TABLET | Freq: Four times a day (QID) | ORAL | Status: DC | PRN

## 2015-08-02 MED ORDER — IPRATROPIUM BROMIDE 0.02 % IN SOLN: 0.5000 mg | Freq: Four times a day (QID) | RESPIRATORY_TRACT | Status: DC

## 2015-08-02 MED ORDER — IPRATROPIUM-ALBUTEROL 0.5-2.5 (3) MG/3ML IN SOLN: 3.0000 mL | RESPIRATORY_TRACT | Status: DC | PRN

## 2015-08-02 MED ORDER — LEVOFLOXACIN 500 MG PO TABS: 500.0000 mg | ORAL_TABLET | Freq: Every day | ORAL | Status: DC

## 2015-08-02 MED ORDER — SODIUM CHLORIDE 0.9 % IV SOLN: 250.0000 mL | INTRAVENOUS | Status: DC | PRN

## 2015-08-02 MED ORDER — ALBUTEROL (5 MG/ML) CONTINUOUS INHALATION SOLN
15.0000 mg/h | INHALATION_SOLUTION | Freq: Once | RESPIRATORY_TRACT | Status: AC
  Administered 2015-08-02: 15 mg/h via RESPIRATORY_TRACT
  Filled 2015-08-02: qty 20

## 2015-08-02 MED ORDER — GUAIFENESIN-DM 100-10 MG/5ML PO SYRP: 5.0000 mL | ORAL_SOLUTION | ORAL | Status: DC | PRN

## 2015-08-02 MED ORDER — ALBUTEROL SULFATE (2.5 MG/3ML) 0.083% IN NEBU: 5.0000 mg | INHALATION_SOLUTION | RESPIRATORY_TRACT | Status: DC | PRN

## 2015-08-02 MED ORDER — ALBUTEROL SULFATE (2.5 MG/3ML) 0.083% IN NEBU: 2.5000 mg | INHALATION_SOLUTION | RESPIRATORY_TRACT | Status: DC | PRN

## 2015-08-02 MED ORDER — SODIUM CHLORIDE 0.9 % IJ SOLN: 3.0000 mL | INTRAMUSCULAR | Status: DC | PRN

## 2015-08-02 MED ORDER — METHYLPREDNISOLONE SODIUM SUCC 125 MG IJ SOLR: 80.0000 mg | Freq: Two times a day (BID) | INTRAMUSCULAR | Status: DC

## 2015-08-02 MED ORDER — CETYLPYRIDINIUM CHLORIDE 0.05 % MT LIQD
7.0000 mL | Freq: Two times a day (BID) | OROMUCOSAL | Status: DC
  Administered 2015-08-02: 7 mL via OROMUCOSAL

## 2015-08-02 MED ORDER — LEVOFLOXACIN IN D5W 750 MG/150ML IV SOLN: 750.0000 mg | INTRAVENOUS | Status: DC

## 2015-08-02 MED ORDER — ASPIRIN EC 81 MG PO TBEC
81.0000 mg | DELAYED_RELEASE_TABLET | Freq: Every day | ORAL | Status: DC
  Filled 2015-08-02: qty 1

## 2015-08-02 MED ORDER — ACETAMINOPHEN 650 MG RE SUPP: 650.0000 mg | Freq: Four times a day (QID) | RECTAL | Status: DC | PRN

## 2015-08-02 NOTE — ED Notes (Signed)
Breathing tx completed

## 2015-08-02 NOTE — ED Notes (Signed)
Pt states he became more SOB after being escorted of property. Pt able to carry on conversation w/o problem. Pt on neb at this time.

## 2015-08-02 NOTE — ED Provider Notes (Signed)
CSN: [id number]     Arrival date & time 08/02/15  0238 History   First MD Initiated Contact with Patient 08/02/15 0245    Chief Complaint  Patient presents with  . Shortness of Breath     (Consider location/radiation/quality/duration/timing/severity/associated sxs/prior Treatment) HPI Patient has a history of COPD, pacemaker, coronary artery disease, was just released from the hospital earlier this evening AMA for a acute COPD exacerbation after inappropriate behavior persistently to the nursing staff. EMS was called shortly afterwards to a local convenience store and patient got a nebulizer treatment and refused to be transported. They were called back this evening for shortness of breath. Patient states his COPD has been exacerbated for the past week. He states he's coughing up white sputum. He states 2 days ago he had a temperature of 101-102 "at home". However patient is homeless. Patient was seen in the ED on September 26 also for COPD exacerbation.  PCP Dr Thedore Mins in [facility] Cardiologist Dr Betsy Pries in [place]  Past Medical History  Diagnosis Date  . Severe aortic valve stenosis     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 at [facility]  . COPD (chronic obstructive pulmonary disease) (HCC)   . Thoracic ascending aortic aneurysm (HCC)     repaired 06/2014 at [facility]  . COPD (chronic obstructive pulmonary disease) (HCC)   . BPH (benign prostatic hyperplasia)   . Pacemaker     battery replaced 06/2014 at [facility]  . SSS (sick sinus syndrome) (HCC)     s/p PPM   . CAD (coronary artery disease)     ?MI in 2007  . Pneumothorax     traumatic 2007  . Anxiety and depression   . Psychosis   . Bipolar disorder (HCC)   . CHF (congestive heart failure) (HCC)   . Neuropathic pain   . Homelessness   . Tobacco abuse    Past Surgical History  Procedure Laterality Date  . Pacemaker placement  2007    battery replaced 07/04/2014  . Hiatal hernia repair    . Knee surgery      L5-6  . Lumbar fusion    .  Aortic valve replacement      porcine valve  . Cardiac surgery      ascending thoracic aneurysm repair 06/2014   Family History  Problem Relation Age of Onset  . CAD Father   . Diabetes Father   . High blood pressure Brother   . Cancer Brother    Social History  Substance Use Topics  . Smoking status: Current Some Day Smoker -- 1.00 packs/day    Types: Cigarettes  . Smokeless tobacco: None  . Alcohol Use: Yes     Comment: occ  homeless States he is on a marijuana study at [facility] to study the interaction of marijuana on patients exposed to agent orange. States his pulmonary doctor told him to smoke cigarettes so he "can cough that junk out of my lungs".   Review of Systems  All other systems reviewed and are negative.     Allergies  Tiotropium bromide monohydrate and Morphine and related  Home Medications   Prior to Admission medications   Medication Sig Start Date End Date Taking? Authorizing Provider  albuterol (PROAIR HFA) 108 (90 BASE) MCG/ACT inhaler Inhale 1-2 puffs into the lungs every 6 (six) hours as needed for wheezing or shortness of breath. Patient not taking: Reported on 05/30/2015 03/08/15   Jerald Kief, MD  albuterol (PROVENTIL HFA;VENTOLIN HFA) 108 (90 BASE)  MCG/ACT inhaler Inhale 2 puffs into the lungs every 6 (six) hours as needed for wheezing or shortness of breath. Patient not taking: Reported on 08/01/2015 05/30/15   Gayla Doss, MD  albuterol (PROVENTIL HFA;VENTOLIN HFA) 108 (90 BASE) MCG/ACT inhaler Inhale 2 puffs into the lungs every 6 (six) hours. 05/31/15   Houston Siren, MD  albuterol (PROVENTIL) (2.5 MG/3ML) 0.083% nebulizer solution Inhale 3 mLs into the lungs every 8 (eight) hours as needed. Shortness of breath 07/18/15   Historical Provider, MD  aspirin EC 81 MG tablet Take 81 mg by mouth daily.    Historical Provider, MD  budesonide-formoterol (SYMBICORT) 80-4.5 MCG/ACT inhaler Inhale 2 puffs into the lungs 2 (two) times daily. Patient not taking:  Reported on 08/01/2015 07/25/15   Mancel Bale, MD  docusate sodium (COLACE) 100 MG capsule Take 100 mg by mouth daily.    Historical Provider, MD  folic acid (FOLVITE) 1 MG tablet Take 1 tablet (1 mg total) by mouth daily. 10/04/14   Richarda Overlie, MD  furosemide (LASIX) 40 MG tablet Take 1 tablet (40 mg total) by mouth daily. 01/05/15   Bethann Berkshire, MD  ipratropium-albuterol (DUONEB) 0.5-2.5 (3) MG/3ML SOLN Take 3 mLs by nebulization every 4 (four) hours as needed. Patient taking differently: Take 3 mLs by nebulization every 4 (four) hours as needed (for shortness of breath).  03/08/15   Jerald Kief, MD  levalbuterol [facility] HFA) 45 MCG/ACT inhaler Inhale 1 puff into the lungs every 4 (four) hours as needed for wheezing or shortness of breath.    Historical Provider, MD  potassium chloride SA (K-DUR,KLOR-CON) 20 MEQ tablet Take 20 mEq by mouth daily.    Historical Provider, MD  predniSONE (DELTASONE) 10 MG tablet Take q day 6,5,4,3,2,1 Patient not taking: Reported on 08/01/2015 07/25/15   Mancel Bale, MD  SYMBICORT 160-4.5 MCG/ACT inhaler Inhale 2 puffs into the lungs 2 (two) times daily. 07/14/15   Historical Provider, MD  tamsulosin (FLOMAX) 0.4 MG CAPS capsule Take 0.4 mg by mouth at bedtime.    Historical Provider, MD  vardenafil (LEVITRA) 10 MG tablet Take 10 mg by mouth as needed for erectile dysfunction.    Historical Provider, MD   BP 115/85 mmHg  Pulse 96  Temp(Src) 97.6 F (36.4 C)  Resp 22  Ht 5\' 6"  (1.676 m)  Wt 165 lb (74.844 kg)  BMI 26.64 kg/m2  SpO2 100%  Vital signs normal   Physical Exam  Constitutional: He is oriented to person, place, and time. He appears well-developed and well-nourished.  Non-toxic appearance. He does not appear ill. No distress.  HENT:  Head: Normocephalic and atraumatic.  Right Ear: External ear normal.  Left Ear: External ear normal.  Nose: Nose normal. No mucosal edema or rhinorrhea.  Mouth/Throat: Oropharynx is clear and moist and mucous  membranes are normal. No dental abscesses or uvula swelling.  Eyes: Conjunctivae and EOM are normal. Pupils are equal, round, and reactive to light.  Neck: Normal range of motion and full passive range of motion without pain. Neck supple.  Cardiovascular: Normal rate, regular rhythm and normal heart sounds.  Exam reveals no gallop and no friction rub.   No murmur heard. Pulmonary/Chest: Effort normal. Tachypnea noted. No respiratory distress. He has decreased breath sounds. He has wheezes. He has no rhonchi. He has no rales. He exhibits no tenderness and no crepitus.  Patient is getting a nebulizer treatment. He is noted to have diffuse end expiratory high-pitched wheezes.  Abdominal: Soft.  Normal appearance and bowel sounds are normal. He exhibits no distension. There is no tenderness. There is no rebound and no guarding.  Musculoskeletal: Normal range of motion. He exhibits no edema or tenderness.  Moves all extremities well.   Neurological: He is alert and oriented to person, place, and time. He has normal strength. No cranial nerve deficit.  Skin: Skin is warm, dry and intact. No rash noted. No erythema. No pallor.  Psychiatric: He has a normal mood and affect. His speech is normal and behavior is normal. His mood appears not anxious.  Nursing note and vitals reviewed.   ED Course  Procedures (including critical care time)  Medications  ipratropium-albuterol (DUONEB) 0.5-2.5 (3) MG/3ML nebulizer solution 3 mL (not administered)  albuterol (PROVENTIL,VENTOLIN) solution continuous neb (15 mg/hr Nebulization Given 08/02/15 0320)  methylPREDNISolone sodium succinate (SOLU-MEDROL) 125 mg/2 mL injection 80 mg (80 mg Intravenous Given 08/02/15 0453)     At the time of my exam patient was finishing a albuterol nebulizer started by EMS.  Patient was given a continuous nebulizer.  3:37 AM nurse reports patient became hypoxic to 88% getting his continuous nebulizer with medical air. He was  placed on oxygen to finish his nebulizer.  Recheck after his continuous nebulizer had finished at 04:30. Pt now has pulse ox of 92-93 % on RA, is feeling better, however when I listen to him he is still having diffuse end expiratory wheezes. PT is agreeable to being readmitted. His behavior in the ED tonight sounds much improved from what he was displaying earlier in the evening while hospitalized when he was dischaged AMA.   04:42 Dr Onalee Hua, admit to obs, med-surg, requests solumedrol 80 mg IV to be given.   Nurses report when patient sat up to use the urinal his oxygen saturation dropped to 87%. His pulse ox improved when he replaced his nasal cannula.  Labs Review Results for orders placed or performed during the hospital encounter of 08/01/15  Basic metabolic panel  Result Value Ref Range   Sodium 141 135 - 145 mmol/L   Potassium 4.1 3.5 - 5.1 mmol/L   Chloride 102 101 - 111 mmol/L   CO2 33 (H) 22 - 32 mmol/L   Glucose, Bld 121 (H) 65 - 99 mg/dL   BUN 17 6 - 20 mg/dL   Creatinine, Ser 7.82 0.61 - 1.24 mg/dL   Calcium 8.5 (L) 8.9 - 10.3 mg/dL   GFR calc non Af Amer >60 >60 mL/min   GFR calc Af Amer >60 >60 mL/min   Anion gap 6 5 - 15  CBC  Result Value Ref Range   WBC 14.3 (H) 4.0 - 10.5 K/uL   RBC 4.33 4.22 - 5.81 MIL/uL   Hemoglobin 14.2 13.0 - 17.0 g/dL   HCT 95.6 21.3 - 08.6 %   MCV 102.8 (H) 78.0 - 100.0 fL   MCH 32.8 26.0 - 34.0 pg   MCHC 31.9 30.0 - 36.0 g/dL   RDW 57.8 (H) 46.9 - 62.9 %   Platelets 160 150 - 400 K/uL  Troponin I  Result Value Ref Range   Troponin I <0.03 <0.031 ng/mL  Troponin I (q 6hr x 3)  Result Value Ref Range   Troponin I <0.03 <0.031 ng/mL   Laboratory interpretation all normal except metabolic alkalosis c/w COPD, leukocytosis    Imaging Review Dg Chest Portable 1 View  08/01/2015   CLINICAL DATA:  Right-sided stabbing chest pain with bilateral arm pain and shortness of breath for  1 week. History of myocardial infarction and COPD.  Initial encounter.  EXAM: PORTABLE CHEST 1 VIEW  COMPARISON:  07/24/2015 and 05/30/2015.  FINDINGS: 1056 hours. Two views obtained. Left subclavian pacemaker leads appear unchanged within the right atrium and right ventricle. The heart size and mediastinal contours are stable status post median sternotomy and aortic valve replacement. The lungs are hyperinflated but clear. There is no pleural effusion or pneumothorax. Surgical clips are present within the right axilla. There are postsurgical changes within the right chest wall.  IMPRESSION: Stable postoperative chest.  No acute cardiopulmonary process.   Electronically Signed   By: Carey Bullocks M.D.   On: 08/01/2015 11:18   I have personally reviewed and evaluated these images and lab results as part of my medical decision-making.   EKG Interpretation   Date/Time:  Wednesday August 02 2015 02:45:33 EDT Ventricular Rate:  91 PR Interval:  198 QRS Duration: 148 QT Interval:  449 QTC Calculation: 552 R Axis:   -89 Text Interpretation:  Atrial-sensed ventricular-paced rhythm No further  analysis attempted due to paced rhythm No significant change since last  tracing 01 Aug 2015 Confirmed by [facility]  MD-I, Andria Head ([id number]) on 08/02/2015  2:53:06 AM      MDM   Final diagnoses:  COPD exacerbation (HCC)  Hypoxia    Plan admission  Devoria Albe, MD, FACEP  CRITICAL CARE Performed by: Devoria Albe L Total critical care time: 31 min Critical care time was exclusive of separately billable procedures and treating other patients. Critical care was necessary to treat or prevent imminent or life-threatening deterioration. Critical care was time spent personally by me on the following activities: development of treatment plan with patient and/or surrogate as well as nursing, discussions with consultants, evaluation of patient's response to treatment, examination of patient, obtaining history from patient or surrogate, ordering and performing treatments and  interventions, ordering and review of laboratory studies, ordering and review of radiographic studies, pulse oximetry and re-evaluation of patient's condition.    Devoria Albe, MD 08/02/15 [phone]

## 2015-08-02 NOTE — Discharge Summary (Signed)
Physician Discharge Summary  [name] [name] ZOX:[id number] DOB: 1945/11/14 DOA: 08/02/2015  PCP: Daryel November, MD  Admit date: 08/02/2015 Discharge date: 08/02/2015  Time spent: 35 minutes  Recommendations for Outpatient Follow-up:  1. Follow up with PCP within 1-2 weeks. 2. Continue steroids, nebulizer treatments and bronchodilators as needed.  3. Follow up with psychiatrist as an outpatient for further resolution of bipolar disorder.  Discharge Diagnoses:  Principal Problem:   COPD exacerbation (HCC) Active Problems:   COPD (chronic obstructive pulmonary disease) (HCC)   Severe aortic valve stenosis   CAD (coronary artery disease)   SOB (shortness of breath)   Bipolar 1 disorder (HCC)   Homelessness   Chronic combined systolic and diastolic CHF (congestive heart failure) (HCC)   COPD with exacerbation (HCC)   Discharge Condition: Improved  Diet recommendation: Normal  Filed Weights   08/02/15 0240  Weight: 74.844 kg (165 lb)    History of present illness:  69 yo male with h/o COPD, bipolar disorder who presented to the ED with SOB and wheezing. Patient left AMA due to not being able to smoke in his room. He later called EMS again and came back to the ED with diffuse wheezing and hypoxia with O2 sat's in the upper 80's. His CXR was unremarkable but his labs showed elevated WBC at 14.3 and negative troponin. He improved with an hour of albuterol and nebulizer treatments in the ED but was admitted to the hospital for further management.  Hospital Course:  69 yo male was admitted to the hospital for COPD exacerbation and acute respiratory failure with hypoxia that improved with IV Solu-Medrol, Levaquin and nebulizer treatments. His wheezing resolved and he ultimately reached 92% O2 on room air. He appears to be breathing comfortably. He is able to carry on a sentence without difficulty. He will be con on pred taper and bronchidilators. He has been advised to quit smoking.    Patient was also noted to be significantly macic and likely has a history of bipolar disorder, type 2. He had flight of ideas and pressured speech. He refused inpatient tele psych evaluation. His case was discussed with Dr. Lucianne Muss, with psychiatry. It was felt that since the patient was not suicidal/homocidal and was not having auditor/visual hallucinations. His bipolar could likely be managed as an outpatient. A referral to psychiatry has been made.  Procedures:  None  Consultations:  None  Discharge Exam: Filed Vitals:   08/02/15 0519  BP: 118/66  Pulse: 92  Temp: 97.9 F (36.6 C)  Resp: 18    General:  Appears comfortable, calm. Cardiovascular: Regular rate and rhythm, no murmur, rub or gallop. No lower extremity edema. Respiratory: Diminished breath sounds bilaterally. No wheezes, rales or rhonchi. Normal respiratory effort. Abdomen: soft, ntnd Psychiatric: grossly normal mood and affect, speech fluent and appropriate  Discharge Instructions    Current Discharge Medication List    CONTINUE these medications which have NOT CHANGED   Details  !! albuterol (PROAIR HFA) 108 (90 BASE) MCG/ACT inhaler Inhale 1-2 puffs into the lungs every 6 (six) hours as needed for wheezing or shortness of breath. Qty: 1 Inhaler, Refills: 0    !! albuterol (PROVENTIL HFA;VENTOLIN HFA) 108 (90 BASE) MCG/ACT inhaler Inhale 2 puffs into the lungs every 6 (six) hours as needed for wheezing or shortness of breath. Qty: 1 Inhaler, Refills: 0    !! albuterol (PROVENTIL HFA;VENTOLIN HFA) 108 (90 BASE) MCG/ACT inhaler Inhale 2 puffs into the lungs every 6 (six) hours. Qty: 1 Inhaler,  Refills: 0    albuterol (PROVENTIL) (2.5 MG/3ML) 0.083% nebulizer solution Inhale 3 mLs into the lungs every 8 (eight) hours as needed. Shortness of breath Refills: 0    aspirin EC 81 MG tablet Take 81 mg by mouth daily.    budesonide-formoterol (SYMBICORT) 80-4.5 MCG/ACT inhaler Inhale 2 puffs into the lungs 2  (two) times daily. Qty: 1 Inhaler, Refills: 0    docusate sodium (COLACE) 100 MG capsule Take 100 mg by mouth daily.    folic acid (FOLVITE) 1 MG tablet Take 1 tablet (1 mg total) by mouth daily. Qty: 30 tablet, Refills: 0    furosemide (LASIX) 40 MG tablet Take 1 tablet (40 mg total) by mouth daily. Qty: 30 tablet, Refills: 0    ipratropium-albuterol (DUONEB) 0.5-2.5 (3) MG/3ML SOLN Take 3 mLs by nebulization every 4 (four) hours as needed. Qty: 24 mL, Refills: 0    levalbuterol (XOPENEX HFA) 45 MCG/ACT inhaler Inhale 1 puff into the lungs every 4 (four) hours as needed for wheezing or shortness of breath.    potassium chloride SA (K-DUR,KLOR-CON) 20 MEQ tablet Take 20 mEq by mouth daily.    predniSONE (DELTASONE) 10 MG tablet Take q day 6,5,4,3,2,1 Qty: 21 tablet, Refills: 0    SYMBICORT 160-4.5 MCG/ACT inhaler Inhale 2 puffs into the lungs 2 (two) times daily. Refills: 0    tamsulosin (FLOMAX) 0.4 MG CAPS capsule Take 0.4 mg by mouth at bedtime.    vardenafil (LEVITRA) 10 MG tablet Take 10 mg by mouth as needed for erectile dysfunction.     !! - Potential duplicate medications found. Please discuss with provider.     Allergies  Allergen Reactions  . Tiotropium Bromide Monohydrate Nausea And Vomiting, Other (See Comments) and Nausea Only    Tremors Reaction:  Tremors  . Morphine And Related Other (See Comments)    Reaction:  Hallucinations       The results of significant diagnostics from this hospitalization (including imaging, microbiology, ancillary and laboratory) are listed below for reference.    Significant Diagnostic Studies: Dg Chest Portable 1 View  08/01/2015   CLINICAL DATA:  Right-sided stabbing chest pain with bilateral arm pain and shortness of breath for 1 week. History of myocardial infarction and COPD. Initial encounter.  EXAM: PORTABLE CHEST 1 VIEW  COMPARISON:  07/24/2015 and 05/30/2015.  FINDINGS: 1056 hours. Two views obtained. Left subclavian  pacemaker leads appear unchanged within the right atrium and right ventricle. The heart size and mediastinal contours are stable status post median sternotomy and aortic valve replacement. The lungs are hyperinflated but clear. There is no pleural effusion or pneumothorax. Surgical clips are present within the right axilla. There are postsurgical changes within the right chest wall.  IMPRESSION: Stable postoperative chest.  No acute cardiopulmonary process.   Electronically Signed   By: Carey Bullocks M.D.   On: 08/01/2015 11:18   Dg Chest Portable 1 View  07/24/2015   CLINICAL DATA:  Chest pain today.  EXAM: PORTABLE CHEST 1 VIEW  COMPARISON:  May 30, 2015  FINDINGS: The heart size and mediastinal contours are stable. Cardiac pacemaker and cardiac valvular replacement ring are unchanged. The lungs are hyperinflated. There is no focal infiltrate, pulmonary edema, or pleural effusion. The visualized skeletal structures are stable.  IMPRESSION: No active cardiopulmonary disease.  Emphysema.   Electronically Signed   By: Sherian Rein M.D.   On: 07/24/2015 23:11    Microbiology:  Labs: Basic Metabolic Panel:  Recent Labs Lab 08/01/15 1100  NA 141  K 4.1  CL 102  CO2 33*  GLUCOSE 121*  BUN 17  CREATININE 0.83  CALCIUM 8.5*   Liver Function Tests: CBC:  Recent Labs Lab 08/01/15 1100  WBC 14.3*  HGB 14.2  HCT 44.5  MCV 102.8*  PLT 160   Cardiac Enzymes:  Recent Labs Lab 08/01/15 1100 08/01/15 1716  TROPONINI <0.03 <0.03   BNP: BNP (last 3 results)  Recent Labs  12/28/14 2025 01/05/15 0950 03/06/15 1657  BNP 131.0* 94.0 92.0    ProBNP (last 3 results)  Recent Labs  09/01/14 1722 09/04/14 2121 09/24/14 0704  PROBNP 653.8* 333.0* 798.5*    By signing my name below, I, Edman Circle attest that this documentation has been prepared under the direction and in the presence of Erick Blinks, MD  Electronically signed: Edman Circle  08/02/2015 10:41  AM   Signed:  Erick Blinks, M.D.  [facility] 08/02/2015, 10:41 AM    I, Dr. Erick Blinks, personally performed the services described in this documentaiton. All medical record entries made by the scribe were at my direction and in my presence. I have reviewed the chart and agree that the record reflects my personal performance and is accurate and complete  Erick Blinks, MD, 08/02/2015 11:47 AM

## 2015-08-02 NOTE — ED Notes (Signed)
Pt c/o increased sob since leaving the hospital AMA earlier tonight.

## 2015-08-02 NOTE — ED Notes (Signed)
Pt provided drink/snack at this time. 

## 2015-08-02 NOTE — Clinical Social Work Note (Signed)
CSW referred as pt stated he was homeless. Pt well known to [place] department at [facility]. Both this CSW, and [employer] met with pt at bedside. Pt made several inappropriate sexual remarks, such as "Who are you pregnant by, me or your husband?" He continued to ask personal questions until CSW shared that discussion was about pt. He states he was just kidding when he said he was homeless and that he owns multiple properties in the [place] that are all rented out. Pt reports that he stays in motels, but has most recently been staying with his "adopted mom" Joaquim Lai in [place]. Pt has transportation. He claims he was "thrown out" last night from hospital and that the police brought him back early this morning. He is anxious to see MD so he can leave. CSW will sign off.  Benay Pike, [place]

## 2015-08-02 NOTE — ED Notes (Signed)
Pt removed from breathing treatment. O2 sats remain 100%.

## 2015-08-02 NOTE — H&P (Signed)
PCP:   Daryel November, MD   Chief Complaint:  sob  HPI: 69 yo male h/o copd. Chf, homeless, bipolar disorder comes into ED second time today for sob and diffuse wheezing.  Pt left AMA earlier due to not being able to smoke in his room and very inappropriate behavior to the nursing staff and was beligerent in the ED earlier.  Pt says he was kicked out.  He left, and got sob again, called EMS and came to ED with diffuse wheezing and hypoxic with oxygen sats in the upper 80s.  He has received an hour long albuterol neb and feeling 110% better per his report.  He denies fever.  Still wheezing and sob, but much better.  He assures me that he will act well to the staff upstairs, and he seems to be acting appropriate this time in the ED.  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  He is very concerned about the elderly demented woman whom he calls his "adopted mom" as he is the only one that takes care of her.  Review of Systems:  Positive and negative as per HPI otherwise all other systems are negative  Past Medical History: Past Medical History  Diagnosis Date  . Severe aortic valve stenosis     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 at [facility]  . COPD (chronic obstructive pulmonary disease) (HCC)   . Thoracic ascending aortic aneurysm (HCC)     repaired 06/2014 at [facility]  . COPD (chronic obstructive pulmonary disease) (HCC)   . BPH (benign prostatic hyperplasia)   . Pacemaker     battery replaced 06/2014 at [facility]  . SSS (sick sinus syndrome) (HCC)     s/p PPM   . CAD (coronary artery disease)     ?MI in 2007  . Pneumothorax     traumatic 2007  . Anxiety and depression   . Psychosis   . Bipolar disorder (HCC)   . CHF (congestive heart failure) (HCC)   . Neuropathic pain   . Homelessness   . Tobacco abuse    Past Surgical History  Procedure Laterality Date  . Pacemaker placement  2007    battery replaced  07/04/2014  . Hiatal hernia repair    . Knee surgery      L5-6  . Lumbar fusion    . Aortic valve replacement      porcine valve  . Cardiac surgery      ascending thoracic aneurysm repair 06/2014    Medications: Prior to Admission medications   Medication Sig Start Date End Date Taking? Authorizing Provider  albuterol (PROAIR HFA) 108 (90 BASE) MCG/ACT inhaler Inhale 1-2 puffs into the lungs every 6 (six) hours as needed for wheezing or shortness of breath. Patient not taking: Reported on 05/30/2015 03/08/15   Jerald Kief, MD  albuterol (PROVENTIL HFA;VENTOLIN HFA) 108 (90 BASE) MCG/ACT inhaler Inhale 2 puffs into the lungs every 6 (six) hours as needed for wheezing or shortness of breath. Patient not taking: Reported on 08/01/2015 05/30/15   Gayla Doss, MD  albuterol (PROVENTIL HFA;VENTOLIN HFA) 108 (90 BASE) MCG/ACT inhaler Inhale 2 puffs into the lungs every 6 (six) hours. 05/31/15   Houston Siren, MD  albuterol (PROVENTIL) (2.5 MG/3ML) 0.083% nebulizer solution Inhale 3 mLs into the lungs every 8 (eight) hours as needed. Shortness of breath 07/18/15   Historical Provider, MD  aspirin EC 81  MG tablet Take 81 mg by mouth daily.    Historical Provider, MD  budesonide-formoterol (SYMBICORT) 80-4.5 MCG/ACT inhaler Inhale 2 puffs into the lungs 2 (two) times daily. Patient not taking: Reported on 08/01/2015 07/25/15   Mancel Bale, MD  docusate sodium (COLACE) 100 MG capsule Take 100 mg by mouth daily.    Historical Provider, MD  folic acid (FOLVITE) 1 MG tablet Take 1 tablet (1 mg total) by mouth daily. 10/04/14   Richarda Overlie, MD  furosemide (LASIX) 40 MG tablet Take 1 tablet (40 mg total) by mouth daily. 01/05/15   Bethann Berkshire, MD  ipratropium-albuterol (DUONEB) 0.5-2.5 (3) MG/3ML SOLN Take 3 mLs by nebulization every 4 (four) hours as needed. Patient taking differently: Take 3 mLs by nebulization every 4 (four) hours as needed (for shortness of breath).  03/08/15   Jerald Kief, MD   levalbuterol [facility] HFA) 45 MCG/ACT inhaler Inhale 1 puff into the lungs every 4 (four) hours as needed for wheezing or shortness of breath.    Historical Provider, MD  potassium chloride SA (K-DUR,KLOR-CON) 20 MEQ tablet Take 20 mEq by mouth daily.    Historical Provider, MD  predniSONE (DELTASONE) 10 MG tablet Take q day 6,5,4,3,2,1 Patient not taking: Reported on 08/01/2015 07/25/15   Mancel Bale, MD  SYMBICORT 160-4.5 MCG/ACT inhaler Inhale 2 puffs into the lungs 2 (two) times daily. 07/14/15   Historical Provider, MD  tamsulosin (FLOMAX) 0.4 MG CAPS capsule Take 0.4 mg by mouth at bedtime.    Historical Provider, MD  vardenafil (LEVITRA) 10 MG tablet Take 10 mg by mouth as needed for erectile dysfunction.    Historical Provider, MD    Allergies:   Allergies  Allergen Reactions  . Tiotropium Bromide Monohydrate Nausea And Vomiting, Other (See Comments) and Nausea Only    Tremors Reaction:  Tremors  . Morphine And Related Other (See Comments)    Reaction:  Hallucinations     Social History:  reports that he has been smoking Cigarettes.  He has been smoking about 1.00 pack per day. He does not have any smokeless tobacco history on file. He reports that he drinks alcohol. He reports that he uses illicit drugs (Marijuana).  Family History: Family History  Problem Relation Age of Onset  . CAD Father   . Diabetes Father   . High blood pressure Brother   . Cancer Brother     Physical Exam: Filed Vitals:   08/02/15 0300 08/02/15 0322 08/02/15 0330 08/02/15 0400  BP: 127/83  119/78 137/78  Pulse: 88  79 89  Temp:      Resp: Height:      Weight:      SpO2: 100% 94% 91% 100%   General appearance: alert, cooperative and mild distress Head: Normocephalic, without obvious abnormality, atraumatic Eyes: negative Nose: Nares normal. Septum midline. Mucosa normal. No drainage or sinus tenderness. Neck: no JVD and supple, symmetrical, trachea midline Lungs: diminished  breath sounds bilaterally and wheezes bilaterally Heart: regular rate and rhythm, S1, S2 normal, no murmur, click, rub or gallop Abdomen: soft, non-tender; bowel sounds normal; no masses,  no organomegaly Extremities: extremities normal, atraumatic, no cyanosis or edema Pulses: 2+ and symmetric Skin: Skin color, texture, turgor normal. No rashes or lesions Neurologic: Grossly normal  Psych:   No psychosis, clear thinking    Labs on Admission:   Recent Labs  08/01/15 1100  NA 141  K 4.1  CL 102  CO2 33*  GLUCOSE 121*  BUN 17  CREATININE 0.83  CALCIUM 8.5*    Recent Labs  08/01/15 1100  WBC 14.3*  HGB 14.2  HCT 44.5  MCV 102.8*  PLT 160    Recent Labs  08/01/15 1100 08/01/15 1716  TROPONINI <0.03 <0.03    Radiological Exams on Admission: Dg Chest Portable 1 View  08/01/2015   CLINICAL DATA:  Right-sided stabbing chest pain with bilateral arm pain and shortness of breath for 1 week. History of myocardial infarction and COPD. Initial encounter.  EXAM: PORTABLE CHEST 1 VIEW  COMPARISON:  07/24/2015 and 05/30/2015.  FINDINGS: 1056 hours. Two views obtained. Left subclavian pacemaker leads appear unchanged within the right atrium and right ventricle. The heart size and mediastinal contours are stable status post median sternotomy and aortic valve replacement. The lungs are hyperinflated but clear. There is no pleural effusion or pneumothorax. Surgical clips are present within the right axilla. There are postsurgical changes within the right chest wall.  IMPRESSION: Stable postoperative chest.  No acute cardiopulmonary process.   Electronically Signed   By: Carey Bullocks M.D.   On: 08/01/2015 11:18   Old chart reviewed  Case discussed with dr Jodelle Gross knapp  cxr reviewed from yesterday no infiltrate or edema  Assessment/Plan  69 yo male with acute hypoxic respiratory failure mild with copde  Principal Problem:   COPD exacerbation (HCC)-  With hypoxia, wheezing.  Give iv  solumedrol.  Levaquin.  freq nebs.  obs on medical floor.  Prn oxygen and wean as tolerates, after his neb in ED sats are above 92% on RA.   Active Problems:   COPD (chronic obstructive pulmonary disease) (HCC)- noted   Severe aortic valve stenosis-- noted   CAD (coronary artery disease)-noted   SOB (shortness of breath)- due to above   Bipolar 1 disorder (HCC)- noted   Homelessness- noted, consult case management   Chronic combined systolic and diastolic CHF (congestive heart failure) (HCC)= stable and compensated at this time.  Cont home lasix  Pt reports he takes all of his home meds.  He is already asking when he can be discharged, and if this is possible in the next couple of hours.  I have forewarned him that it would be unusual that he would be discharged only after 5 hours of being hospitalized, and that I doubted his respiratory status will turn around that quickly, but told him it is possible but unlikely.  He is satisfied with this answer for now.  He does not appear to be having a psychotic episode at this time and appears competent.  Dyshaun Bonzo A 08/02/2015, 4:45 AM

## 2015-08-02 NOTE — Progress Notes (Signed)
Patient discharged home today.  Patient was given discharge instructions, prescriptions, and care notes.  Patient verbalized understanding with no complaints or concerns voiced at this time.  RN attempted to make follow up appointment with Dr. Charlyne Quale, [facility], however patient stated he was no longer seeing that doctor.  When Rn asked who new PCP was to make appointment, patient stated he could not recall but had an appointment scheduled for next week.  Patient's IV was removed by patient with catheter intact, no bleeding or complications on 08/02/15, prior to discharge.  Patient left unit in stable condition by a staff member in a wheelchair.

## 2015-08-02 NOTE — ED Notes (Signed)
Pt O2 sats dropped to 88% on medical air. Switch breathing treatment to oxygen. EDP notified & respiratory informed.

## 2015-08-02 NOTE — ED Notes (Signed)
Pt stood up to use restroom, O2 sats dropped to 87%. Pt placed on 2L sats returned to 94%

## 2015-08-02 NOTE — Progress Notes (Signed)
ANTIBIOTIC CONSULT NOTE - INITIAL  Pharmacy Consult for Levaquin Indication: COPD exacerbation  Allergies  Allergen Reactions  . Tiotropium Bromide Monohydrate Nausea And Vomiting, Other (See Comments) and Nausea Only    Tremors Reaction:  Tremors  . Morphine And Related Other (See Comments)    Reaction:  Hallucinations    Patient Measurements: Height:  (167.6 cm) Weight: 165 lb (74.844 kg) IBW/kg (Calculated) : 63.8  Vital Signs: Temp: 97.9 F (36.6 C) (10/05 0519) Temp Source: Oral (10/05 0519) BP: 118/66 mmHg (10/05 0519) Pulse Rate: 92 (10/05 0519) Intake/Output from previous day:   Intake/Output from this shift:    Labs:  Recent Labs  08/01/15 1100  WBC 14.3*  HGB 14.2  PLT 160  CREATININE 0.83   Estimated Creatinine Clearance: 75.8 mL/min (by C-G formula based on Cr of 0.83). No results for input(s): VANCOTROUGH, VANCOPEAK, VANCORANDOM, GENTTROUGH, GENTPEAK, GENTRANDOM, TOBRATROUGH, TOBRAPEAK, TOBRARND, AMIKACINPEAK, AMIKACINTROU, AMIKACIN in the last 72 hours.   Microbiology: No results found for this or any previous visit (from the past 720 hour(s)).  Medical History: Past Medical History  Diagnosis Date  . Severe aortic valve stenosis     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 at [facility]  . COPD (chronic obstructive pulmonary disease) (HCC)   . Thoracic ascending aortic aneurysm (HCC)     repaired 06/2014 at [facility]  . COPD (chronic obstructive pulmonary disease) (HCC)   . BPH (benign prostatic hyperplasia)   . Pacemaker     battery replaced 06/2014 at [facility]  . SSS (sick sinus syndrome) (HCC)     s/p PPM   . CAD (coronary artery disease)     ?MI in 2007  . Pneumothorax     traumatic 2007  . Anxiety and depression   . Psychosis   . Bipolar disorder (HCC)   . CHF (congestive heart failure) (HCC)   . Neuropathic pain   . Homelessness   . Tobacco abuse    Anti-infectives    Start     Dose/Rate Route Frequency Ordered Stop   08/02/15 1600   levofloxacin (LEVAQUIN) IVPB 750 mg     750 mg 100 mL/hr over 90 Minutes Intravenous Every 24 hours 08/02/15 0743       Assessment: 69 yo male h/o copd. Chf, homeless, bipolar disorder comes into ED second time today for sob and diffuse wheezing. Pt left AMA earlier due to not being able to smoke in his room and very inappropriate behavior to the nursing staff and was beligerent in the ED earlier. Pt says he was kicked out. He left, and got sob again, called EMS and came to ED with diffuse wheezing and hypoxic with oxygen sats in the upper 80s. He has received an hour long albuterol neb and feeling 110% better per his report. He denies fever. Still wheezing and sob, but much better.   Goal of Therapy:  Eradicate infection.  Plan:  Levaquin  IV q24hrs Switch to PO when improved / appropriate Monitor labs, progress and c/s  Margo Aye, Maytte Jacot A 08/02/2015,7:43 AM

## 2015-08-02 NOTE — ED Notes (Signed)
O2 sats remaining 92% after continuous neb treatment

## 2015-08-06 ENCOUNTER — Encounter (HOSPITAL_COMMUNITY): Payer: Self-pay | Admitting: Emergency Medicine

## 2015-08-06 ENCOUNTER — Emergency Department (HOSPITAL_COMMUNITY)
Admission: EM | Admit: 2015-08-06 | Discharge: 2015-08-06 | Discharge status: Against Medical Advice | Payer: Medicare HMO | Attending: Emergency Medicine | Admitting: Emergency Medicine

## 2015-08-06 DIAGNOSIS — I509 Heart failure, unspecified: Secondary | ICD-10-CM | POA: Diagnosis not present

## 2015-08-06 DIAGNOSIS — Z720 Tobacco use: Secondary | ICD-10-CM | POA: Insufficient documentation

## 2015-08-06 DIAGNOSIS — I2510 Atherosclerotic heart disease of native coronary artery without angina pectoris: Secondary | ICD-10-CM | POA: Diagnosis not present

## 2015-08-06 DIAGNOSIS — J441 Chronic obstructive pulmonary disease with (acute) exacerbation: Secondary | ICD-10-CM | POA: Insufficient documentation

## 2015-08-06 DIAGNOSIS — R0602 Shortness of breath: Secondary | ICD-10-CM | POA: Diagnosis present

## 2015-08-06 NOTE — ED Notes (Signed)
Pt cursing and demanding to have a breathing treatment and his " supper soaker " states he can't breath pt refusing to answer questions. States he was here four days ago and we have all of the information and we were waisting his breath talking to him. Pt had already told tech to get out of his room and I ( CHARGE NURSE) could leave also. Pt made aware we would be glad to treat him but he was not going to scream at [place] and say what staff could come in and out of his room. Pt rolled himself from his room to the nursing station wanting to know when he was getting any help. Pt instructed to return to his room. Police called to remove pt due to pt being belligerent and hostile to staff.

## 2015-08-06 NOTE — ED Provider Notes (Signed)
This patient left before being seen. I did not examine him or obtain a history from him.  Rolland Porter, MD 08/06/15 [phone]

## 2015-08-06 NOTE — ED Notes (Signed)
Pt states that he was sitting out in front of the hospital in his car and talked to the third floor and was told that they did not have his property and it "sent me into a COPD attack".  Pt very argumentative at triage and making sexually inappropriate comments.

## 2015-08-12 ENCOUNTER — Inpatient Hospital Stay (HOSPITAL_COMMUNITY)
Admission: EM | Admit: 2015-08-12 | Discharge: 2015-08-15 | DRG: 190 | Discharge status: Against Medical Advice | Payer: Medicare HMO | Attending: Internal Medicine | Admitting: Internal Medicine

## 2015-08-12 ENCOUNTER — Encounter (HOSPITAL_COMMUNITY): Payer: Self-pay | Admitting: Emergency Medicine

## 2015-08-12 ENCOUNTER — Emergency Department (HOSPITAL_COMMUNITY): Payer: Medicare HMO

## 2015-08-12 DIAGNOSIS — T380X5A Adverse effect of glucocorticoids and synthetic analogues, initial encounter: Secondary | ICD-10-CM | POA: Diagnosis present

## 2015-08-12 DIAGNOSIS — F312 Bipolar disorder, current episode manic severe with psychotic features: Secondary | ICD-10-CM | POA: Diagnosis present

## 2015-08-12 DIAGNOSIS — Z8249 Family history of ischemic heart disease and other diseases of the circulatory system: Secondary | ICD-10-CM | POA: Diagnosis not present

## 2015-08-12 DIAGNOSIS — J9612 Chronic respiratory failure with hypercapnia: Secondary | ICD-10-CM

## 2015-08-12 DIAGNOSIS — Z9119 Patient's noncompliance with other medical treatment and regimen: Secondary | ICD-10-CM

## 2015-08-12 DIAGNOSIS — F17210 Nicotine dependence, cigarettes, uncomplicated: Secondary | ICD-10-CM | POA: Diagnosis present

## 2015-08-12 DIAGNOSIS — E785 Hyperlipidemia, unspecified: Secondary | ICD-10-CM | POA: Diagnosis present

## 2015-08-12 DIAGNOSIS — J9601 Acute respiratory failure with hypoxia: Secondary | ICD-10-CM | POA: Diagnosis present

## 2015-08-12 DIAGNOSIS — F302 Manic episode, severe with psychotic symptoms: Secondary | ICD-10-CM | POA: Diagnosis not present

## 2015-08-12 DIAGNOSIS — Z950 Presence of cardiac pacemaker: Secondary | ICD-10-CM | POA: Diagnosis not present

## 2015-08-12 DIAGNOSIS — J449 Chronic obstructive pulmonary disease, unspecified: Secondary | ICD-10-CM | POA: Diagnosis present

## 2015-08-12 DIAGNOSIS — I252 Old myocardial infarction: Secondary | ICD-10-CM | POA: Diagnosis not present

## 2015-08-12 DIAGNOSIS — Z833 Family history of diabetes mellitus: Secondary | ICD-10-CM

## 2015-08-12 DIAGNOSIS — R0602 Shortness of breath: Secondary | ICD-10-CM | POA: Diagnosis not present

## 2015-08-12 DIAGNOSIS — Z720 Tobacco use: Secondary | ICD-10-CM | POA: Diagnosis not present

## 2015-08-12 DIAGNOSIS — Z952 Presence of prosthetic heart valve: Secondary | ICD-10-CM

## 2015-08-12 DIAGNOSIS — J441 Chronic obstructive pulmonary disease with (acute) exacerbation: Secondary | ICD-10-CM | POA: Diagnosis present

## 2015-08-12 DIAGNOSIS — J9621 Acute and chronic respiratory failure with hypoxia: Secondary | ICD-10-CM

## 2015-08-12 DIAGNOSIS — Z809 Family history of malignant neoplasm, unspecified: Secondary | ICD-10-CM | POA: Diagnosis not present

## 2015-08-12 DIAGNOSIS — F319 Bipolar disorder, unspecified: Secondary | ICD-10-CM | POA: Diagnosis present

## 2015-08-12 DIAGNOSIS — J9611 Chronic respiratory failure with hypoxia: Secondary | ICD-10-CM

## 2015-08-12 DIAGNOSIS — J9610 Chronic respiratory failure, unspecified whether with hypoxia or hypercapnia: Secondary | ICD-10-CM

## 2015-08-12 LAB — BLOOD GAS, VENOUS
Acid-Base Excess: 5.2 mmol/L — ABNORMAL HIGH (ref 0.0–2.0)
Bicarbonate: 26.4 mEq/L — ABNORMAL HIGH (ref 20.0–24.0)
FIO2: 28
O2 Content: 2 L/min
O2 Saturation: 85.3 %
Patient temperature: 37
TCO2: 16.2 mmol/L (ref 0–100)
pCO2, Ven: 73.9 mmHg (ref 45.0–50.0)
pH, Ven: 7.258 (ref 7.250–7.300)
pO2, Ven: 55.5 mmHg — ABNORMAL HIGH (ref 30.0–45.0)

## 2015-08-12 LAB — COMPREHENSIVE METABOLIC PANEL
ALT: 26 U/L (ref 17–63)
AST: 22 U/L (ref 15–41)
Albumin: 4 g/dL (ref 3.5–5.0)
Alkaline Phosphatase: 74 U/L (ref 38–126)
Anion gap: 8 (ref 5–15)
BUN: 18 mg/dL (ref 6–20)
CO2: 31 mmol/L (ref 22–32)
Calcium: 9 mg/dL (ref 8.9–10.3)
Chloride: 101 mmol/L (ref 101–111)
Creatinine, Ser: 0.76 mg/dL (ref 0.61–1.24)
GFR calc Af Amer: >60 mL/min (ref >60)
GFR calc non Af Amer: >60 mL/min (ref >60)
Glucose, Bld: 182 mg/dL — ABNORMAL HIGH (ref 65–99)
Potassium: 4.5 mmol/L (ref 3.5–5.1)
Sodium: 140 mmol/L (ref 135–145)
Total Bilirubin: 0.7 mg/dL (ref 0.3–1.2)
Total Protein: 6.7 g/dL (ref 6.5–8.1)

## 2015-08-12 LAB — CBC WITH DIFFERENTIAL/PLATELET
Basophils Absolute: 0 10*3/uL (ref 0.0–0.1)
Basophils Relative: 0 %
Eosinophils Absolute: 0 10*3/uL (ref 0.0–0.7)
Eosinophils Relative: 0 %
HCT: 47.3 % (ref 39.0–52.0)
Hemoglobin: 15.2 g/dL (ref 13.0–17.0)
Lymphocytes Relative: 2 %
Lymphs Abs: 0.5 10*3/uL — ABNORMAL LOW (ref 0.7–4.0)
MCH: 33 pg (ref 26.0–34.0)
MCHC: 32.1 g/dL (ref 30.0–36.0)
MCV: 102.8 fL — ABNORMAL HIGH (ref 78.0–100.0)
Monocytes Absolute: 1.2 10*3/uL — ABNORMAL HIGH (ref 0.1–1.0)
Monocytes Relative: 4 %
Neutro Abs: 25.6 10*3/uL — ABNORMAL HIGH (ref 1.7–7.7)
Neutrophils Relative %: 94 %
Platelets: 174 10*3/uL (ref 150–400)
RBC: 4.6 MIL/uL (ref 4.22–5.81)
RDW: 15.8 % — ABNORMAL HIGH (ref 11.5–15.5)
WBC: 27.3 10*3/uL — ABNORMAL HIGH (ref 4.0–10.5)

## 2015-08-12 MED ORDER — ALBUTEROL SULFATE (2.5 MG/3ML) 0.083% IN NEBU
2.5000 mg | INHALATION_SOLUTION | Freq: Four times a day (QID) | RESPIRATORY_TRACT | Status: DC
  Administered 2015-08-12 – 2015-08-13 (×3): 2.5 mg via RESPIRATORY_TRACT
  Filled 2015-08-12 (×3): qty 3

## 2015-08-12 MED ORDER — TAMSULOSIN HCL 0.4 MG PO CAPS
0.4000 mg | ORAL_CAPSULE | Freq: Every day | ORAL | Status: DC
  Administered 2015-08-12 – 2015-08-14 (×2): 0.4 mg via ORAL
  Filled 2015-08-12 (×2): qty 1

## 2015-08-12 MED ORDER — DEXAMETHASONE SODIUM PHOSPHATE 10 MG/ML IJ SOLN
10.0000 mg | Freq: Once | INTRAMUSCULAR | Status: AC
  Administered 2015-08-12: 10 mg via INTRAVENOUS
  Filled 2015-08-12: qty 1

## 2015-08-12 MED ORDER — KETOROLAC TROMETHAMINE 30 MG/ML IJ SOLN
30.0000 mg | Freq: Four times a day (QID) | INTRAMUSCULAR | Status: DC | PRN
  Administered 2015-08-12: 30 mg via INTRAVENOUS
  Filled 2015-08-12: qty 1

## 2015-08-12 MED ORDER — DOCUSATE SODIUM 100 MG PO CAPS
100.0000 mg | ORAL_CAPSULE | Freq: Every day | ORAL | Status: DC
  Administered 2015-08-13 – 2015-08-15 (×3): 100 mg via ORAL
  Filled 2015-08-12 (×3): qty 1

## 2015-08-12 MED ORDER — FOLIC ACID 1 MG PO TABS
1.0000 mg | ORAL_TABLET | Freq: Every day | ORAL | Status: DC
  Administered 2015-08-13 – 2015-08-15 (×3): 1 mg via ORAL
  Filled 2015-08-12 (×3): qty 1

## 2015-08-12 MED ORDER — ALBUTEROL (5 MG/ML) CONTINUOUS INHALATION SOLN
10.0000 mg/h | INHALATION_SOLUTION | RESPIRATORY_TRACT | Status: DC
  Administered 2015-08-12: 10 mg/h via RESPIRATORY_TRACT
  Filled 2015-08-12: qty 20

## 2015-08-12 MED ORDER — FUROSEMIDE 40 MG PO TABS
40.0000 mg | ORAL_TABLET | Freq: Every day | ORAL | Status: DC
  Administered 2015-08-13 – 2015-08-15 (×3): 40 mg via ORAL
  Filled 2015-08-12 (×3): qty 1

## 2015-08-12 MED ORDER — SODIUM CHLORIDE 0.9 % IJ SOLN
3.0000 mL | Freq: Two times a day (BID) | INTRAMUSCULAR | Status: DC
  Administered 2015-08-12 – 2015-08-14 (×5): 3 mL via INTRAVENOUS

## 2015-08-12 MED ORDER — DEXTROSE 5 % IV SOLN
500.0000 mg | INTRAVENOUS | Status: DC
  Administered 2015-08-13: 500 mg via INTRAVENOUS
  Filled 2015-08-12 (×2): qty 500

## 2015-08-12 MED ORDER — ENOXAPARIN SODIUM 40 MG/0.4ML ~~LOC~~ SOLN
40.0000 mg | SUBCUTANEOUS | Status: DC
  Administered 2015-08-14: 40 mg via SUBCUTANEOUS
  Filled 2015-08-12 (×2): qty 0.4

## 2015-08-12 MED ORDER — METHYLPREDNISOLONE SODIUM SUCC 40 MG IJ SOLR
40.0000 mg | Freq: Four times a day (QID) | INTRAMUSCULAR | Status: DC
  Administered 2015-08-12 – 2015-08-13 (×2): 40 mg via INTRAVENOUS
  Filled 2015-08-12 (×2): qty 1

## 2015-08-12 MED ORDER — POTASSIUM CHLORIDE CRYS ER 20 MEQ PO TBCR
20.0000 meq | EXTENDED_RELEASE_TABLET | Freq: Every day | ORAL | Status: DC
Start: 2015-08-13 — End: 2015-08-15
  Administered 2015-08-13 – 2015-08-15 (×3): 20 meq via ORAL
  Filled 2015-08-12 (×3): qty 1

## 2015-08-12 MED ORDER — ACETAMINOPHEN 500 MG PO TABS: 1000.0000 mg | ORAL_TABLET | Freq: Once | ORAL | Status: DC

## 2015-08-12 MED ORDER — CETYLPYRIDINIUM CHLORIDE 0.05 % MT LIQD
7.0000 mL | Freq: Two times a day (BID) | OROMUCOSAL | Status: DC
  Administered 2015-08-12 – 2015-08-14 (×3): 7 mL via OROMUCOSAL

## 2015-08-12 MED ORDER — ALBUTEROL SULFATE (2.5 MG/3ML) 0.083% IN NEBU
2.5000 mg | INHALATION_SOLUTION | RESPIRATORY_TRACT | Status: DC | PRN
  Administered 2015-08-13 – 2015-08-15 (×7): 2.5 mg via RESPIRATORY_TRACT
  Filled 2015-08-12 (×8): qty 3

## 2015-08-12 MED ORDER — DEXTROSE 5 % IV SOLN
500.0000 mg | Freq: Once | INTRAVENOUS | Status: AC
  Administered 2015-08-12: 500 mg via INTRAVENOUS
  Filled 2015-08-12: qty 500

## 2015-08-12 MED ORDER — ACETAMINOPHEN 325 MG PO TABS: 650.0000 mg | ORAL_TABLET | ORAL | Status: DC | PRN

## 2015-08-12 MED ORDER — ASPIRIN EC 81 MG PO TBEC
81.0000 mg | DELAYED_RELEASE_TABLET | Freq: Every day | ORAL | Status: DC
  Administered 2015-08-13 – 2015-08-15 (×3): 81 mg via ORAL
  Filled 2015-08-12 (×3): qty 1

## 2015-08-12 MED ORDER — BUDESONIDE-FORMOTEROL FUMARATE 160-4.5 MCG/ACT IN AERO
2.0000 | INHALATION_SPRAY | Freq: Two times a day (BID) | RESPIRATORY_TRACT | Status: DC
  Filled 2015-08-12: qty 6

## 2015-08-12 MED ORDER — IPRATROPIUM-ALBUTEROL 0.5-2.5 (3) MG/3ML IN SOLN: 3.0000 mL | RESPIRATORY_TRACT | Status: DC | PRN

## 2015-08-12 NOTE — Progress Notes (Signed)
At approximately 1845 I was assembling CPAP for the patient in his room while, the phlebotomist was attempting to draw blood. He was verbally abusive to us , used vulgar language and threatened to "kill and mowed down anyone in his room." The patient stated " he would not wear CPAP." He allowed me to apply 2L [place] and I promptly vacated the room.

## 2015-08-12 NOTE — ED Notes (Signed)
Pt was requesting something for migraine headache pain -- also requesting something to drink and to turn on tv. Pt noted to be frequently falling asleep - easily rousable

## 2015-08-12 NOTE — ED Provider Notes (Signed)
CSN: [id number]     Arrival date & time    History   First MD Initiated Contact with Patient 08/12/15 1647     Chief Complaint  Patient presents with  . Shortness of Breath     (Consider location/radiation/quality/duration/timing/severity/associated sxs/prior Treatment) HPI Patient presents with respiratory distress via EMS. The patient started having difficult breathing last night, went to another emergency department facility, received multiple breathing treatments there. Patient left AGAINST MEDICAL ADVICE approximately 12 hours ago, after there was some sort of event in the area. Patient has had persistently worsening difficulty breathing since that time. Patient has had minimal improvement with albuterol provided by EMS providers. He denies any recent changes in his condition, including no fever, vomiting, states that he feels awful all over.   Past Medical History  Diagnosis Date  . Severe aortic valve stenosis     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 at [facility]  . COPD (chronic obstructive pulmonary disease) (HCC)   . Thoracic ascending aortic aneurysm (HCC)     repaired 06/2014 at [facility]  . COPD (chronic obstructive pulmonary disease) (HCC)   . BPH (benign prostatic hyperplasia)   . Pacemaker     battery replaced 06/2014 at [facility]  . SSS (sick sinus syndrome) (HCC)     s/p PPM   . CAD (coronary artery disease)     ?MI in 2007  . Pneumothorax     traumatic 2007  . Anxiety and depression   . Psychosis   . Bipolar disorder (HCC)   . CHF (congestive heart failure) (HCC)   . Neuropathic pain   . Homelessness   . Tobacco abuse    Past Surgical History  Procedure Laterality Date  . Pacemaker placement  2007    battery replaced 07/04/2014  . Hiatal hernia repair    . Knee surgery      L5-6  . Lumbar fusion    . Aortic valve replacement      porcine valve  . Cardiac surgery      ascending thoracic aneurysm repair 06/2014   Family History  Problem Relation Age of  Onset  . CAD Father   . Diabetes Father   . High blood pressure Brother   . Cancer Brother    Social History  Substance Use Topics  . Smoking status: Current Some Day Smoker -- 1.00 packs/day    Types: Cigarettes  . Smokeless tobacco: None  . Alcohol Use: Yes     Comment: occ    Review of Systems  Unable to perform ROS: Acuity of condition      Allergies  Tiotropium bromide monohydrate and Morphine and related  Home Medications   Prior to Admission medications   Medication Sig Start Date End Date Taking? Authorizing Provider  albuterol (PROAIR HFA) 108 (90 BASE) MCG/ACT inhaler Inhale 1-2 puffs into the lungs every 6 (six) hours as needed for wheezing or shortness of breath. Patient not taking: Reported on 05/30/2015 03/08/15   Jerald Kief, MD  aspirin EC 81 MG tablet Take 81 mg by mouth daily.    Historical Provider, MD  docusate sodium (COLACE) 100 MG capsule Take 100 mg by mouth daily.    Historical Provider, MD  folic acid (FOLVITE) 1 MG tablet Take 1 tablet (1 mg total) by mouth daily. 10/04/14   Richarda Overlie, MD  furosemide (LASIX) 40 MG tablet Take 1 tablet (40 mg total) by mouth daily. 01/05/15   Bethann Berkshire, MD  ipratropium-albuterol (DUONEB) 0.5-2.5 (3)  MG/3ML SOLN Take 3 mLs by nebulization every 4 (four) hours as needed (for shortness of breath). 08/02/15   Erick Blinks, MD  levofloxacin (LEVAQUIN) 500 MG tablet Take 1 tablet (500 mg total) by mouth daily. 08/02/15   Erick Blinks, MD  potassium chloride SA (K-DUR,KLOR-CON) 20 MEQ tablet Take 20 mEq by mouth daily.    Historical Provider, MD  predniSONE (DELTASONE) 10 MG tablet Take  daily for 2 days then  daily for 2 days then  daily for 2 days then  daily for 2 days then stop 08/02/15   Erick Blinks, MD  SYMBICORT 160-4.5 MCG/ACT inhaler Inhale 2 puffs into the lungs 2 (two) times daily. 07/14/15   Historical Provider, MD  tamsulosin (FLOMAX) 0.4 MG CAPS capsule Take 0.4 mg by mouth at bedtime.     Historical Provider, MD  vardenafil (LEVITRA) 10 MG tablet Take 10 mg by mouth as needed for erectile dysfunction.    Historical Provider, MD   BP 126/57 mmHg  Pulse 81  Resp 20  Ht 5' 10.5" (1.791 m)  Wt 165 lb (74.844 kg)  BMI 23.33 kg/m2  SpO2 99% Physical Exam  Constitutional: He is oriented to person, place, and time. He appears well-developed. He appears distressed.  HENT:  Head: Normocephalic and atraumatic.  Eyes: Conjunctivae and EOM are normal.  Cardiovascular: Normal rate and regular rhythm.   Pulmonary/Chest: Accessory muscle usage present. No stridor. Tachypnea noted. He is in respiratory distress. He has decreased breath sounds. He has wheezes.  Abdominal: He exhibits no distension.  Musculoskeletal: He exhibits no edema.  Neurological: He is alert and oriented to person, place, and time.  Skin: Skin is warm and dry.  Psychiatric: He has a normal mood and affect. Thought content is delusional.  Nursing note and vitals reviewed.   ED Course  Procedures (including critical care time) Labs Review Labs Reviewed  COMPREHENSIVE METABOLIC PANEL - Abnormal; Notable for the following:    Glucose, Bld 182 (*)    All other components within normal limits  CBC WITH DIFFERENTIAL/PLATELET - Abnormal; Notable for the following:    WBC 27.3 (*)    MCV 102.8 (*)    RDW 15.8 (*)    Neutro Abs 25.6 (*)    Lymphs Abs 0.5 (*)    Monocytes Absolute 1.2 (*)    All other components within normal limits  BLOOD GAS, VENOUS - Abnormal; Notable for the following:    pCO2, Ven 73.9 (*)    pO2, Ven 55.5 (*)    Bicarbonate 26.4 (*)    Acid-Base Excess 5.2 (*)    All other components within normal limits    Imaging Review Dg Chest Port 1 View  08/12/2015  CLINICAL DATA:  Shortness of Breath EXAM: PORTABLE CHEST 1 VIEW COMPARISON:  08/01/2015 FINDINGS: The mediastinal silhouette is stable. Status post median sternotomy. Dual lead cardiac pacemaker is unchanged in position. No acute  infiltrate or pleural effusion. No pulmonary edema. Old right upper rib fractures are again noted. IMPRESSION: No active disease. Electronically Signed   By: Natasha Mead M.D.   On: 08/12/2015 17:25   I have personally reviewed and evaluated these images and lab results as part of my medical decision-making.    I discussed patient's case with our EMS colleagues on arrival. Patient is wearing a nonrebreather mask, receiving supplemental oxygen. Oxygen saturation is 95% with supplemental option abnormal  Chart review demonstrates 6 prior visits to this facility in 6 months, reversible or concerns.  Patient has made multiple inappropriate comments to nursing staff.   On repeat exam the patient remains in similar condition.  Patient is now receiving continuous albuterol therapy, having received additional steroids.  Update: Patient now somnolent, but awakens with minimal stimuli. Given his change in cognition, BiPAP ordered  Patient refuses BiPaP.  He also refuses other acute interventions.  Labs notable for WBC>25K. No xr e/o PNA, but w persistent corse BS, aspiration is a consideration.  Antibiotics started for COPD exacerbation    MDM  Patient presents in respiratory distress. This patient is tachypneic, tachycardic, with notable wheezing throughout all lung fields. She received multiple nebulizer treatments, including continuous endolaser therapy, steroids. Patient had change in cognition from interactive to somnolent, though he awakened easily. Patient deferred BiPAP. Patient required admission for further evaluation, management.  Gerhard Munchobert Mirela Parsley, MD 08/12/15 2132

## 2015-08-12 NOTE — ED Notes (Signed)
3 treatments of nebs-( 2 had atrovent )in   mag 2grams , Solumedrol 125mg s  Given by EMS - Called out for sob- pt has an Iv in his Rt AC that was apparently started by [facility] -- pt stated that he was seen last night and left because he felt better and they got busy.

## 2015-08-12 NOTE — H&P (Signed)
[facility] History and Physical  [name] [name] ZOX:[id number] DOB: [birth year]    PCP:   Daryel November, MD   Chief Complaint:  SOB  HPI:  [name] [name] is a 69 y.o. male, with PMHx noted below including MI (2007), pacemaker placement, COPD, CAD, CHF, daily tobacco use (1ppd), and homelessness, who presented again with increase SOB.  He also has SOB, wheezing, coughs, but no fever or chills. He continued to smoke. I admitted him last time, and he was very non compliant and aggressive toward the staff, subsequently left AMA, then returned and was readmitted.  Pt denies home O2, stent placement, fever, chills, Hx of blood clot, surgeries past three months, Tx for cancer, abd pain, n/v/d, recent weight gain, leg swelling, or any other Sx at this time.  Evaluation in the ER with negative CXR, WBC of 25K and normal renal Fx. His initial troponin was negative and his EKG showed LBBB. He was given  IV Decadron and IV ZIthromax. Hospitalist was asked to admit him for COPD exacerbation.    Rewiew of Systems:  Constitutional: Negative for malaise, fever and chills. No significant weight loss or weight gain Eyes: Negative for eye pain, redness and discharge, diplopia, visual changes, or flashes of light. ENMT: Negative for ear pain, hoarseness, nasal congestion, sinus pressure and sore throat. No headaches; tinnitus, drooling, or problem swallowing. Cardiovascular: Negative for chest pain, palpitations, diaphoresis,  and peripheral edema. ; No orthopnea, PND Respiratory: Negative for cough, hemoptysis, wheezing and stridor. No pleuritic chestpain. Gastrointestinal: Negative for nausea, vomiting, diarrhea, constipation, abdominal pain, melena, blood in stool, hematemesis, jaundice and rectal bleeding.    Genitourinary: Negative for frequency, dysuria, incontinence,flank pain and hematuria; Musculoskeletal: Negative for back pain and neck pain. Negative for swelling and trauma.;  Skin: .  Negative for pruritus, rash, abrasions, bruising and skin lesion.; ulcerations Neuro: Negative for headache, lightheadedness and neck stiffness. Negative for weakness, altered level of consciousness , altered mental status, extremity weakness, burning feet, involuntary movement, seizure and syncope.  Psych: negative for anxiety, depression, insomnia, tearfulness, panic attacks, hallucinations, paranoia, suicidal or homicidal ideation    Past Medical History  Diagnosis Date  . Severe aortic valve stenosis     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 at [facility]  . COPD (chronic obstructive pulmonary disease) (HCC)   . Thoracic ascending aortic aneurysm (HCC)     repaired 06/2014 at [facility]  . COPD (chronic obstructive pulmonary disease) (HCC)   . BPH (benign prostatic hyperplasia)   . Pacemaker     battery replaced 06/2014 at [facility]  . SSS (sick sinus syndrome) (HCC)     s/p PPM   . CAD (coronary artery disease)     ?MI in 2007  . Pneumothorax     traumatic 2007  . Anxiety and depression   . Psychosis   . Bipolar disorder (HCC)   . CHF (congestive heart failure) (HCC)   . Neuropathic pain   . Homelessness   . Tobacco abuse     Past Surgical History  Procedure Laterality Date  . Pacemaker placement  2007    battery replaced 07/04/2014  . Hiatal hernia repair    . Knee surgery      L5-6  . Lumbar fusion    . Aortic valve replacement      porcine valve  . Cardiac surgery      ascending thoracic aneurysm repair 06/2014    Medications:  HOME MEDS: Prior to Admission medications   Medication Sig  Start Date End Date Taking? Authorizing Provider  albuterol (PROAIR HFA) 108 (90 BASE) MCG/ACT inhaler Inhale 1-2 puffs into the lungs every 6 (six) hours as needed for wheezing or shortness of breath. Patient not taking: Reported on 05/30/2015 03/08/15   Jerald KiefStephen K Chiu, MD  aspirin EC 81 MG tablet Take 81 mg by mouth daily.    Historical Provider, MD  docusate sodium (COLACE) 100 MG capsule Take  100 mg by mouth daily.    Historical Provider, MD  folic acid (FOLVITE) 1 MG tablet Take 1 tablet (1 mg total) by mouth daily. 10/04/14   Richarda OverlieNayana Abrol, MD  furosemide (LASIX) 40 MG tablet Take 1 tablet (40 mg total) by mouth daily. 01/05/15   Bethann BerkshireJoseph Zammit, MD  ipratropium-albuterol (DUONEB) 0.5-2.5 (3) MG/3ML SOLN Take 3 mLs by nebulization every 4 (four) hours as needed (for shortness of breath). 08/02/15   Erick BlinksJehanzeb Memon, MD  levofloxacin (LEVAQUIN) 500 MG tablet Take 1 tablet (500 mg total) by mouth daily. 08/02/15   Erick BlinksJehanzeb Memon, MD  potassium chloride SA (K-DUR,KLOR-CON) 20 MEQ tablet Take 20 mEq by mouth daily.    Historical Provider, MD  predniSONE (DELTASONE) 10 MG tablet Take 40mg  daily for 2 days then 30mg  daily for 2 days then 20mg  daily for 2 days then 10mg  daily for 2 days then stop 08/02/15   Erick BlinksJehanzeb Memon, MD  SYMBICORT 160-4.5 MCG/ACT inhaler Inhale 2 puffs into the lungs 2 (two) times daily. 07/14/15   Historical Provider, MD  tamsulosin (FLOMAX) 0.4 MG CAPS capsule Take 0.4 mg by mouth at bedtime.    Historical Provider, MD  vardenafil (LEVITRA) 10 MG tablet Take 10 mg by mouth as needed for erectile dysfunction.    Historical Provider, MD     Allergies:  Allergies  Allergen Reactions  . Tiotropium Bromide Monohydrate Nausea And Vomiting, Other (See Comments) and Nausea Only    Tremors Reaction:  Tremors  . Morphine And Related Other (See Comments)    Reaction:  Hallucinations     Social History:   reports that he has been smoking Cigarettes.  He has been smoking about 1.00 pack per day. He does not have any smokeless tobacco history on file. He reports that he drinks alcohol. He reports that he uses illicit drugs (Marijuana).  Family History: Family History  Problem Relation Age of Onset  . CAD Father   . Diabetes Father   . High blood pressure Brother   . Cancer Brother      Physical Exam: Filed Vitals:   08/12/15 1730 08/12/15 1800 08/12/15 1830 08/12/15 1845   BP:      Pulse: 80 80 80 81  Resp:      Height:      Weight:      SpO2: 100% 100% 100% 99%   Blood pressure 126/57, pulse 81, resp. rate 20, height 5' 10.5" (1.791 m), weight 74.844 kg (165 lb), SpO2 99 %.  GEN:  Pleasant  patient lying in the stretcher in no acute distress; cooperative with exam. PSYCH:  alert and oriented x4; does not appear anxious or depressed; affect is appropriate. HEENT: Mucous membranes pink and anicteric; PERRLA; EOM intact; no cervical lymphadenopathy nor thyromegaly or carotid bruit; no JVD; There were no stridor. Neck is very supple. Breasts:: Not examined CHEST WALL: No tenderness CHEST: Normal respiration, some wheezing.  No rales.  HEART: Regular rate and rhythm.  There are no murmur, rub, or gallops.   BACK: No kyphosis or scoliosis; no CVA  tenderness ABDOMEN: soft and non-tender; no masses, no organomegaly, normal abdominal bowel sounds; no pannus; no intertriginous candida. There is no rebound and no distention. Rectal Exam: Not done EXTREMITIES: No bone or joint deformity; age-appropriate arthropathy of the hands and knees; no edema; no ulcerations.  There is no calf tenderness. Genitalia: not examined PULSES: 2+ and symmetric SKIN: Normal hydration no rash or ulceration CNS: Cranial nerves 2-12 grossly intact no focal lateralizing neurologic deficit.  Speech is fluent; uvula elevated with phonation, facial symmetry and tongue midline. DTR are normal bilaterally, cerebella exam is intact, barbinski is negative and strengths are equaled bilaterally.  No sensory loss.   Labs on Admission:  Basic Metabolic Panel:  Recent Labs Lab 08/12/15 1836  NA 140  K 4.5  CL 101  CO2 31  GLUCOSE 182*  BUN 18  CREATININE 0.76  CALCIUM 9.0   Liver Function Tests:  Recent Labs Lab 08/12/15 1836  AST 22  ALT 26  ALKPHOS 74  BILITOT 0.7  PROT 6.7  ALBUMIN 4.0  CBC:  Recent Labs Lab 08/12/15 1836  WBC 27.3*  NEUTROABS 25.6*  HGB 15.2  HCT  47.3  MCV 102.8*  PLT 174     Radiological Exams on Admission: Dg Chest Port 1 View  08/12/2015  CLINICAL DATA:  Shortness of Breath EXAM: PORTABLE CHEST 1 VIEW COMPARISON:  08/01/2015 FINDINGS: The mediastinal silhouette is stable. Status post median sternotomy. Dual lead cardiac pacemaker is unchanged in position. No acute infiltrate or pleural effusion. No pulmonary edema. Old right upper rib fractures are again noted. IMPRESSION: No active disease. Electronically Signed   By: Natasha Mead M.D.   On: 08/12/2015 17:25    Assessment/Plan Present on Admission:  . COPD with exacerbation (HCC) . COPD (chronic obstructive pulmonary disease) (HCC) . Bipolar I disorder, single manic episode, severe, with psychosis (HCC) . HLD (hyperlipidemia) . SOB (shortness of breath) . Tobacco abuse . COPD with acute exacerbation (HCC)  PLAN:  Will admit him to SDU for COPD exacerbation.  He does have ABG showed acute respiratory failure.  He is refusing Bipap, but may require it.  Will continue with nebs, IV steroids, and IV antibiotics.  I will continue his home meds.  It will be difficult to manage him as in the past, he has been non compliant and had signed out AMA.    Other plans as per orders.  Code Status: FULL Unk Lightning, MD. [facility] Pager [phone] 7pm to 7am.  08/12/2015, 8:17 PM

## 2015-08-13 LAB — BASIC METABOLIC PANEL
Anion gap: 5 (ref 5–15)
BUN: 21 mg/dL — ABNORMAL HIGH (ref 6–20)
CO2: 35 mmol/L — ABNORMAL HIGH (ref 22–32)
Calcium: 8.7 mg/dL — ABNORMAL LOW (ref 8.9–10.3)
Chloride: 98 mmol/L — ABNORMAL LOW (ref 101–111)
Creatinine, Ser: 0.86 mg/dL (ref 0.61–1.24)
GFR calc Af Amer: >60 mL/min (ref >60)
GFR calc non Af Amer: >60 mL/min (ref >60)
Glucose, Bld: 177 mg/dL — ABNORMAL HIGH (ref 65–99)
Potassium: 4.8 mmol/L (ref 3.5–5.1)
Sodium: 138 mmol/L (ref 135–145)

## 2015-08-13 LAB — CBC
HCT: 43.7 % (ref 39.0–52.0)
Hemoglobin: 13.7 g/dL (ref 13.0–17.0)
MCH: 32.5 pg (ref 26.0–34.0)
MCHC: 31.4 g/dL (ref 30.0–36.0)
MCV: 103.8 fL — ABNORMAL HIGH (ref 78.0–100.0)
Platelets: 168 10*3/uL (ref 150–400)
RBC: 4.21 MIL/uL — ABNORMAL LOW (ref 4.22–5.81)
RDW: 15.8 % — ABNORMAL HIGH (ref 11.5–15.5)
WBC: 25.1 10*3/uL — ABNORMAL HIGH (ref 4.0–10.5)

## 2015-08-13 LAB — MRSA PCR SCREENING: MRSA by PCR: NEGATIVE

## 2015-08-13 MED ORDER — FUROSEMIDE 10 MG/ML IJ SOLN
20.0000 mg | Freq: Once | INTRAMUSCULAR | Status: AC
  Administered 2015-08-13: 20 mg via INTRAVENOUS
  Filled 2015-08-13: qty 2

## 2015-08-13 MED ORDER — IPRATROPIUM-ALBUTEROL 0.5-2.5 (3) MG/3ML IN SOLN
3.0000 mL | Freq: Four times a day (QID) | RESPIRATORY_TRACT | Status: DC
  Administered 2015-08-13 – 2015-08-15 (×9): 3 mL via RESPIRATORY_TRACT
  Filled 2015-08-13 (×9): qty 3

## 2015-08-13 MED ORDER — METHYLPREDNISOLONE SODIUM SUCC 40 MG IJ SOLR
40.0000 mg | Freq: Two times a day (BID) | INTRAMUSCULAR | Status: DC
  Administered 2015-08-13 – 2015-08-15 (×4): 40 mg via INTRAVENOUS
  Filled 2015-08-13 (×4): qty 1

## 2015-08-13 MED ORDER — DEXTROSE 5 % IV SOLN
INTRAVENOUS | Status: AC
  Filled 2015-08-13: qty 500

## 2015-08-13 MED ORDER — HALOPERIDOL LACTATE 5 MG/ML IJ SOLN
1.0000 mg | Freq: Four times a day (QID) | INTRAMUSCULAR | Status: DC | PRN
  Administered 2015-08-13 – 2015-08-14 (×4): 1 mg via INTRAVENOUS
  Filled 2015-08-13 (×4): qty 1

## 2015-08-13 NOTE — Progress Notes (Signed)
RT Care Note: The patient pulled off BIPAP Mask stating he is claustrophobic. Will continue to monitor, RN aware

## 2015-08-13 NOTE — Progress Notes (Signed)
PROGRESS NOTE  [name] [name] ZOX:[id number] DOB: 11/22/1945 DOA: 08/12/2015 PCP: Daryel NovemberMILLER,GARY, MD  Summary: [age] yom PMH COPD, noncompliance with treatment/leaving AMA, MI (2007), pacemaker, CAD, CHF, and daily tobacco use (1ppd) who [facility], left AMA with IV in place who presented to [facility] ED with increased SOB, wheezing and a cough. While in the ED, CXR was unremarkable. Labs revealed a WBC of 25, normal kidney function and normal troponin. Admitted for management of a COPD exacerbation.   Assessment/Plan: 1. COPD exacerbation with acute hypoxic respiratory failure. Improving with nebs, IV abx, and bronchodilators. WBC high secondary to steroids.  2. Bipolar 1 disorder, continue outpatient medications. Alert and oriented. Has f/u with outpatient psych next week 3. HLD, continue statin 4. Tobacco abuse, counseled on the importance of cessation.  5. Bipolar disorder 6. Noncompliance, inappropriate behavior 7. D/c 10/5 COPD exacerbation   Overall some improvement. Refusing BIPAP but respiratory status appears stable. He is alert and oriented and understands why he is in the hospital. He has no plans to leave AMA.  Continue current treatments.   Code Status: Full DVT prophylaxis: Lovenox Family Communication: No family at bedside. Discussed with patient who understands and has no concerns at this time. Disposition Plan: Monitor in ICU  Brendia Sacksaniel Dutch Ing, MD  [facility]  Pager [phone] If 7PM-7AM, please contact night-coverage at [email or web address], password [email or web address] 08/13/2015, 6:34 AM  LOS: 1 day   Consultants:    Procedures:    Antibiotics:  Zithromax 10/15>>  HPI/Subjective: Feels the same. "Isn't coughing enough".     Objective: Filed Vitals:   08/13/15 0215 08/13/15 0230 08/13/15 0527 08/13/15 0543  BP: 99/61 115/50    Pulse: 90 94    Temp:   97.8 F (36.6 C)   TempSrc:   Oral   Resp: 21 26    Height:      Weight:   75.7 kg (166 lb 14.2  oz)   SpO2: 97% 96%  96%    Intake/Output Summary (Last 24 hours) at 08/13/15 0634 Last data filed at 08/13/15 0527  Gross per 24 hour  Intake    240 ml  Output    400 ml  Net   -160 ml     Filed Weights   08/12/15 1648 08/13/15 0527  Weight: 74.844 kg (165 lb) 75.7 kg (166 lb 14.2 oz)    Exam:    VSS, afebrile, not hypoxic General:  Appears comfortable, calm. Eyes: PERRL, normal lids, irises ENT: grossly normal hearing, lips, tongue Neck: no LAD, masses, thyromegaly Cardiovascular: Regular rate and rhythm, no murmur, rub or gallop. No lower extremity edema. Telemetry: Sinus rhythm, no arrhythmias  Respiratory: Bilateral wheezes. No rales or rhonchi. Mild increased respiratory effort. Speaks in full sentences. Abdomen: soft, ntnd Skin: no rash or induration  Musculoskeletal: grossly normal tone bilateral upper and lower extremities Psychiatric: grossly normal mood and affect, speech fluent and appropriate. Oriented to person, place, month, year, president, and reason for hospitalization.  Neurologic: grossly non-focal.   New data reviewed:  WBC 25.1  Pertinent data since admission:  VBG 7.258/73.9/55.5  WBC 27.3  CXR No active disease, reviewed.  Pending data:    Scheduled Meds: . acetaminophen  1,000 mg Oral Once  . albuterol  2.5 mg Nebulization Q6H  . antiseptic oral rinse  7 mL Mouth Rinse BID  . aspirin EC  81 mg Oral Daily  . azithromycin  500 mg Intravenous Q24H  . budesonide-formoterol  2 puff  Inhalation BID  . docusate sodium  100 mg Oral Daily  . enoxaparin (LOVENOX) injection  40 mg Subcutaneous Q24H  . folic acid  1 mg Oral Daily  . furosemide  40 mg Oral Daily  . methylPREDNISolone (SOLU-MEDROL) injection  40 mg Intravenous Q6H  . potassium chloride SA  20 mEq Oral Daily  . sodium chloride  3 mL Intravenous Q12H  . tamsulosin  0.4 mg Oral QHS   Continuous Infusions:   Principal Problem:   COPD with exacerbation (HCC) Active  Problems:   COPD (chronic obstructive pulmonary disease) (HCC)   Bipolar I disorder, single manic episode, severe, with psychosis (HCC)   SOB (shortness of breath)   Tobacco abuse   HLD (hyperlipidemia)   COPD with acute exacerbation (HCC)   Time spent 20 minutes    By signing my name below, I, Burnett Harry attest that this documentation has been prepared under the direction and in the presence of Brendia Sacks, MD Electronically signed: Burnett Harry, Scribe. 08/13/2015 8:50am  I personally performed the services described in this documentation. All medical record entries made by the scribe were at my direction. I have reviewed the chart and agree that the record reflects my personal performance and is accurate and complete. Brendia Sacks, MD

## 2015-08-13 NOTE — Progress Notes (Addendum)
Staff familiar with patient.  At the beginning of shift upon first meeting with patient, explained to patient that abusive behavior or words towards staff and others would not be tolerated on the floor.  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  Patient stated he understood  conversation.  Will monitor patient.

## 2015-08-13 NOTE — Progress Notes (Signed)
Report called to B. Odis LusterBowers, RN. Patient transferred to 339 in stable condition with RN and NT via wheelchair.

## 2015-08-13 NOTE — Progress Notes (Signed)
Utilization review completed.  

## 2015-08-14 DIAGNOSIS — J9621 Acute and chronic respiratory failure with hypoxia: Secondary | ICD-10-CM

## 2015-08-14 DIAGNOSIS — J9601 Acute respiratory failure with hypoxia: Secondary | ICD-10-CM

## 2015-08-14 DIAGNOSIS — J9611 Chronic respiratory failure with hypoxia: Secondary | ICD-10-CM

## 2015-08-14 DIAGNOSIS — J9610 Chronic respiratory failure, unspecified whether with hypoxia or hypercapnia: Secondary | ICD-10-CM

## 2015-08-14 LAB — GLUCOSE, CAPILLARY: Glucose-Capillary: 173 mg/dL — ABNORMAL HIGH (ref 65–99)

## 2015-08-14 MED ORDER — AZITHROMYCIN 250 MG PO TABS
500.0000 mg | ORAL_TABLET | Freq: Every day | ORAL | Status: DC
  Administered 2015-08-14: 500 mg via ORAL
  Filled 2015-08-14: qty 2

## 2015-08-14 NOTE — Progress Notes (Signed)
PHARMACIST - PHYSICIAN COMMUNICATION DR:   Irene LimboGoodrich CONCERNING: Antibiotic IV to Oral Route Change Policy  RECOMMENDATION: This patient is receiving zithromax 500mg  IV daily by the intravenous route.  Based on criteria approved by the Pharmacy and Therapeutics Committee, the antibiotic(s) is/are being converted to the equivalent oral dose form(s).   DESCRIPTION: These criteria include:  Patient being treated for a respiratory tract infection, urinary tract infection, cellulitis or clostridium difficile associated diarrhea if on metronidazole  The patient is not neutropenic and does not exhibit a GI malabsorption state  The patient is eating (either orally or via tube) and/or has been taking other orally administered medications for a least 24 hours  The patient is improving clinically and has a Tmax < 100.5  If you have questions about this conversion, please contact the Pharmacy Department  []   [phone]( [phone] )  [facility] []   [phone]( [phone]  [facility] []   [phone]( [phone]  [facility] []   [phone]( [phone] )  [facility] []   [phone]( [phone] )  [facility]

## 2015-08-14 NOTE — Progress Notes (Addendum)
PROGRESS NOTE  [name] [name] ZOX:[id number] DOB: 10/31/1945 DOA: 08/12/2015 PCP: Daryel NovemberMILLER,GARY, MD  Summary: [age] yom PMH COPD, noncompliance with treatment/leaving AMA, MI (2007), pacemaker, CAD, CHF, and daily tobacco use (1ppd) who was [facility], left AMA with IV in place who presented to [facility] ED with increased SOB, wheezing and a cough. While in the ED, CXR was unremarkable. Labs revealed a WBC of 25, normal kidney function and normal troponin. Admitted for management of a COPD exacerbation.   Assessment/Plan: 1. COPD exacerbation with acute hypoxic respiratory failure. Continued wheezing and remains hypoxic. No significant change. 2. Bipolar 1 disorder, continue outpatient medications. Alert and oriented. Has f/u with outpatient psych next week 3. Tobacco abuse  4. Bipolar disorder appears stable. 5. Noncompliance, inappropriate behavior; stable.  6. D/c 10/5 COPD exacerbation   About the same today, still hypoxic and wheezing.  Continue abx, steroids, and oxygen and breathing treatment.  Recheck oxygen tomorrow.   Code Status: Full DVT prophylaxis: Lovenox Family Communication: No family at bedside. Discussed with patient who understands and has no concerns at this time. Disposition Plan: home when improved  Brendia Sacksaniel Kauri Garson, MD  [facility]  Pager [phone] If 7PM-7AM, please contact night-coverage at [email or web address], password [email or web address] 08/14/2015, 8:01 AM  LOS: 2 days   Consultants:    Procedures:    Antibiotics:  Zithromax 10/15>>  HPI/Subjective: Breathing is ok. Doesn't believe he is coughing enough. He is able to ambulate to the restroom but when he tries to walk further he has difficulty. Able to eat without difficulty and denies nausea, vomiting, and abd pain.   Objective: Filed Vitals:   08/13/15 2156 08/14/15 0426 08/14/15 0446 08/14/15 0730  BP: 119/56     Pulse: 79  89   Temp: 97.8 F (36.6 C)  97.9 F (36.6 C)   TempSrc: Oral   Oral   Resp: 20     Height:      Weight:      SpO2: 97% 96% 98% 97%    Intake/Output Summary (Last 24 hours) at 08/14/15 0801 Last data filed at 08/13/15 1755  Gross per 24 hour  Intake    720 ml  Output      0 ml  Net    720 ml     Filed Weights   08/12/15 1648 08/13/15 0527  Weight: 74.844 kg (165 lb) 75.7 kg (166 lb 14.2 oz)    Exam: VSS, afebrile, 94% on [place]  General:  Appears calm and comfortable Cardiovascular: RRR, no m/r/g. No LE edema. Telemetry: afib Respiratory: .Poor air movement with bilateral wheezes. No r/r. Able to speak in full sentences.  Psychiatric: grossly normal mood and affect, speech fluent and appropriate  New data reviewed:  No new data  Pertinent data since admission:  VBG 7.258/73.9/55.5  WBC 27.3  CXR No active disease, reviewed.  Pending data:    Scheduled Meds: . antiseptic oral rinse  7 mL Mouth Rinse BID  . aspirin EC  81 mg Oral Daily  . azithromycin  500 mg Intravenous Q24H  . docusate sodium  100 mg Oral Daily  . enoxaparin (LOVENOX) injection  40 mg Subcutaneous Q24H  . folic acid  1 mg Oral Daily  . furosemide  40 mg Oral Daily  . ipratropium-albuterol  3 mL Nebulization QID  . methylPREDNISolone (SOLU-MEDROL) injection  40 mg Intravenous Q12H  . potassium chloride SA  20 mEq Oral Daily  . sodium chloride  3 mL Intravenous Q12H  .  tamsulosin  0.4 mg Oral QHS   Continuous Infusions:   Principal Problem:   COPD with exacerbation (HCC) Active Problems:   COPD (chronic obstructive pulmonary disease) (HCC)   SOB (shortness of breath)   Bipolar 1 disorder (HCC)   Tobacco abuse   HLD (hyperlipidemia)   COPD with acute exacerbation (HCC)   Acute respiratory failure with hypoxia (HCC)   Time spent: 15 minutes  By signing my name below, I, Zadie Cleverly attest that this documentation has been prepared under the direction and in the presence of Brendia Sacks, MD Electronically signed: Zadie Cleverly   08/14/2015 11:59 AM  I personally performed the services described in this documentation. All medical record entries made by the scribe were at my direction. I have reviewed the chart and agree that the record reflects my personal performance and is accurate and complete. Brendia Sacks, MD

## 2015-08-14 NOTE — Care Management Note (Signed)
Case Management Note  Patient Details  Name: Jennings Booksndrew H Eckroth MRN: [id number] Date of Birth: 03/21/1946  Subjective/Objective:                  Pt admitted from home with COPD exacerbation. Pt currently staying with friends and bouncing from place to place. Pt stated that at discharge [place]. Pt has neb machine.   Action/Plan: Pt may need home O2 in place. [facility]. Will fax information but pt not discharging home today. Will continue to follow. Expected Discharge Date:                  Expected Discharge Plan:  Home/Self Care  In-House Referral:  NA  Discharge planning Services  CM Consult  Post Acute Care Choice:  Durable Medical Equipment Choice offered to:  Patient  DME Arranged:  Oxygen [facility]  [facility]    [facility]:     Status of Service:  In process, will continue to follow  Medicare Important Message Given:    Date Medicare IM Given:    Medicare IM give by:    Date Additional Medicare IM Given:    Additional Medicare Important Message give by:     If discussed at Long Length of Stay Meetings, dates discussed:    Additional Comments:  Cheryl FlashBlackwell, Derick Seminara Crowder, RN 08/14/2015, 12:57 PM

## 2015-08-14 NOTE — Progress Notes (Signed)
Patient smoking in the bathroom. Explained to the patient that smoking is not allowed. He insists that he did not smoke but the room and bathroom smells strongly of smoke. Called security to come speak with the patient.

## 2015-08-14 NOTE — Progress Notes (Signed)
Patient has gotten loud after complaining about not being able to breathe. Patient had just received an HHN at 435;patient was woken up upon request at 2300 to have another treatment at 430. Patient has been on 3lpm [place], no shortness of breath. Patient has been using his flutter valve throughout the night. Patient became loud and shouting that he wanted to have the doctor called for an extra treatment; we explained that he was not due until after 7am for another one. Patient given medication to help with his anxiety by the nurse. Will continue motior.

## 2015-08-14 NOTE — Progress Notes (Signed)
Patient's room smells strongly of smoke again after patient went to the bathroom. He states that he did not smoke and says that the smell was "coming from his cologne". Explained the smoking policy to the patient again. He continues to insist that he has not smoked. Contacted AC and security.

## 2015-08-14 NOTE — Progress Notes (Addendum)
Patient's 02 sat with 02 at 2L with sitting is 97% and standing is 95%. Patient's 02 sat without oxygen while sitting is 96%, standing 02 sat on room air is 93% and ambulating without oxygen is 86%. Patient's 02 saturation on 02 at 2L via nasal cannula while ambulating is 94%.

## 2015-08-15 LAB — GLUCOSE, CAPILLARY: Glucose-Capillary: 187 mg/dL — ABNORMAL HIGH (ref 65–99)

## 2015-08-15 NOTE — Progress Notes (Signed)
Patient came to staff and stated his ride was here and he was leaving, explained he would be leaving against medical advice and I could text the Doctor. Patient stated he was leaving, signed the paperwork and ambulated from the floor.

## 2015-08-15 NOTE — Care Management Note (Signed)
Case Management Note  Patient Details  Name: [name] [name] MRN: [id number] Date of Birth: 11/04/1945  Subjective/Objective:                    Action/Plan:   Expected Discharge Date:                  Expected Discharge Plan:  Home/Self Care  In-House Referral:  NA  Discharge planning Services  CM Consult  Post Acute Care Choice:  Durable Medical Equipment Choice offered to:  Patient  DME Arranged:    DME Agency:     HH Arranged:    HH Agency:     Status of Service:  Completed, signed off  Medicare Important Message Given:    Date Medicare IM Given:    Medicare IM give by:    Date Additional Medicare IM Given:    Additional Medicare Important Message give by:     If discussed at Long Length of Stay Meetings, dates discussed:    Additional Comments: Pt signed out AMA. Pt did not qualify for home O2 at discharge. [name], Tad Fancher Lincolnrowder, RN 08/15/2015, 1:38 PM

## 2015-08-15 NOTE — Progress Notes (Signed)
Patient's 02 sat resting with 02 at 2L is 95% with out oxygen is 91%. Patient 02 saturation while ambulating without oxygen is 91% and with oxygen is 93%. Patient's oxygen saturation at rest without oxygen was 95% after 5 minutes.

## 2015-08-15 NOTE — Progress Notes (Signed)
Patient left the hospital AMA today before I had assessed him. No prescriptions were provided.  [name] Hernandez, MD [facility] Pager: [phone]  

## 2015-08-19 ENCOUNTER — Emergency Department (HOSPITAL_COMMUNITY): Payer: Medicare HMO

## 2015-08-19 ENCOUNTER — Other Ambulatory Visit: Payer: Self-pay

## 2015-08-19 ENCOUNTER — Inpatient Hospital Stay (HOSPITAL_COMMUNITY)
Admission: EM | Admit: 2015-08-19 | Discharge: 2015-08-22 | DRG: 190 | Disposition: A | Discharge status: Home / Self Care | Payer: Medicare HMO | Attending: Internal Medicine | Admitting: Internal Medicine

## 2015-08-19 ENCOUNTER — Encounter (HOSPITAL_COMMUNITY): Payer: Self-pay | Admitting: *Deleted

## 2015-08-19 ENCOUNTER — Other Ambulatory Visit (HOSPITAL_COMMUNITY): Payer: Self-pay

## 2015-08-19 DIAGNOSIS — Z809 Family history of malignant neoplasm, unspecified: Secondary | ICD-10-CM | POA: Diagnosis not present

## 2015-08-19 DIAGNOSIS — J441 Chronic obstructive pulmonary disease with (acute) exacerbation: Secondary | ICD-10-CM | POA: Diagnosis not present

## 2015-08-19 DIAGNOSIS — Z66 Do not resuscitate: Secondary | ICD-10-CM | POA: Diagnosis present

## 2015-08-19 DIAGNOSIS — I5022 Chronic systolic (congestive) heart failure: Secondary | ICD-10-CM | POA: Diagnosis present

## 2015-08-19 DIAGNOSIS — I2510 Atherosclerotic heart disease of native coronary artery without angina pectoris: Secondary | ICD-10-CM | POA: Diagnosis present

## 2015-08-19 DIAGNOSIS — I350 Nonrheumatic aortic (valve) stenosis: Secondary | ICD-10-CM | POA: Diagnosis present

## 2015-08-19 DIAGNOSIS — R0602 Shortness of breath: Secondary | ICD-10-CM

## 2015-08-19 DIAGNOSIS — Z8249 Family history of ischemic heart disease and other diseases of the circulatory system: Secondary | ICD-10-CM

## 2015-08-19 DIAGNOSIS — F319 Bipolar disorder, unspecified: Secondary | ICD-10-CM | POA: Diagnosis present

## 2015-08-19 DIAGNOSIS — Z9119 Patient's noncompliance with other medical treatment and regimen: Secondary | ICD-10-CM

## 2015-08-19 DIAGNOSIS — G9340 Encephalopathy, unspecified: Secondary | ICD-10-CM | POA: Diagnosis present

## 2015-08-19 DIAGNOSIS — Z720 Tobacco use: Secondary | ICD-10-CM

## 2015-08-19 DIAGNOSIS — Z5900 Homelessness unspecified: Secondary | ICD-10-CM

## 2015-08-19 DIAGNOSIS — J9601 Acute respiratory failure with hypoxia: Secondary | ICD-10-CM | POA: Diagnosis present

## 2015-08-19 DIAGNOSIS — F17210 Nicotine dependence, cigarettes, uncomplicated: Secondary | ICD-10-CM | POA: Diagnosis present

## 2015-08-19 DIAGNOSIS — J9602 Acute respiratory failure with hypercapnia: Secondary | ICD-10-CM | POA: Diagnosis present

## 2015-08-19 DIAGNOSIS — N400 Enlarged prostate without lower urinary tract symptoms: Secondary | ICD-10-CM | POA: Diagnosis present

## 2015-08-19 DIAGNOSIS — I5042 Chronic combined systolic (congestive) and diastolic (congestive) heart failure: Secondary | ICD-10-CM | POA: Diagnosis present

## 2015-08-19 DIAGNOSIS — Z952 Presence of prosthetic heart valve: Secondary | ICD-10-CM | POA: Diagnosis not present

## 2015-08-19 DIAGNOSIS — Z950 Presence of cardiac pacemaker: Secondary | ICD-10-CM | POA: Diagnosis not present

## 2015-08-19 DIAGNOSIS — Z833 Family history of diabetes mellitus: Secondary | ICD-10-CM | POA: Diagnosis not present

## 2015-08-19 DIAGNOSIS — Z954 Presence of other heart-valve replacement: Secondary | ICD-10-CM

## 2015-08-19 DIAGNOSIS — J9622 Acute and chronic respiratory failure with hypercapnia: Secondary | ICD-10-CM | POA: Diagnosis not present

## 2015-08-19 DIAGNOSIS — J9692 Respiratory failure, unspecified with hypercapnia: Secondary | ICD-10-CM | POA: Diagnosis present

## 2015-08-19 LAB — BASIC METABOLIC PANEL
Anion gap: 7 (ref 5–15)
BUN: 18 mg/dL (ref 6–20)
CO2: 32 mmol/L (ref 22–32)
Calcium: 8.8 mg/dL — ABNORMAL LOW (ref 8.9–10.3)
Chloride: 98 mmol/L — ABNORMAL LOW (ref 101–111)
Creatinine, Ser: 0.72 mg/dL (ref 0.61–1.24)
GFR calc Af Amer: >60 mL/min (ref >60)
GFR calc non Af Amer: >60 mL/min (ref >60)
Glucose, Bld: 94 mg/dL (ref 65–99)
Potassium: 4.2 mmol/L (ref 3.5–5.1)
Sodium: 137 mmol/L (ref 135–145)

## 2015-08-19 LAB — LACTIC ACID, PLASMA: Lactic Acid, Venous: 1.5 mmol/L (ref 0.5–2.0)

## 2015-08-19 LAB — CBC WITH DIFFERENTIAL/PLATELET
Basophils Absolute: 0 10*3/uL (ref 0.0–0.1)
Basophils Relative: 0 %
Eosinophils Absolute: 0.1 10*3/uL (ref 0.0–0.7)
Eosinophils Relative: 1 %
HCT: 45.1 % (ref 39.0–52.0)
Hemoglobin: 14.8 g/dL (ref 13.0–17.0)
Lymphocytes Relative: 11 %
Lymphs Abs: 1.8 10*3/uL (ref 0.7–4.0)
MCH: 33.3 pg (ref 26.0–34.0)
MCHC: 32.8 g/dL (ref 30.0–36.0)
MCV: 101.3 fL — ABNORMAL HIGH (ref 78.0–100.0)
Monocytes Absolute: 1.6 10*3/uL — ABNORMAL HIGH (ref 0.1–1.0)
Monocytes Relative: 10 %
Neutro Abs: 12.4 10*3/uL — ABNORMAL HIGH (ref 1.7–7.7)
Neutrophils Relative %: 78 %
Platelets: 190 10*3/uL (ref 150–400)
RBC: 4.45 MIL/uL (ref 4.22–5.81)
RDW: 15.1 % (ref 11.5–15.5)
WBC: 15.8 10*3/uL — ABNORMAL HIGH (ref 4.0–10.5)

## 2015-08-19 LAB — BLOOD GAS, ARTERIAL
Acid-Base Excess: 5.6 mmol/L — ABNORMAL HIGH (ref 0.0–2.0)
Bicarbonate: 27.2 mEq/L — ABNORMAL HIGH (ref 20.0–24.0)
Drawn by: 23534
O2 Content: 2 L/min
O2 Saturation: 97.3 %
pCO2 arterial: 71 mmHg (ref 35.0–45.0)
pH, Arterial: 7.277 — ABNORMAL LOW (ref 7.350–7.450)
pO2, Arterial: 103 mmHg — ABNORMAL HIGH (ref 80.0–100.0)

## 2015-08-19 LAB — TROPONIN I: Troponin I: <0.03 ng/mL (ref <0.031)

## 2015-08-19 LAB — BRAIN NATRIURETIC PEPTIDE: B Natriuretic Peptide: 100 pg/mL (ref 0.0–100.0)

## 2015-08-19 MED ORDER — FUROSEMIDE 10 MG/ML IJ SOLN
40.0000 mg | Freq: Once | INTRAMUSCULAR | Status: DC
  Filled 2015-08-19 (×2): qty 4

## 2015-08-19 MED ORDER — ALBUTEROL (5 MG/ML) CONTINUOUS INHALATION SOLN
10.0000 mg/h | INHALATION_SOLUTION | Freq: Once | RESPIRATORY_TRACT | Status: AC
  Administered 2015-08-19: 10 mg/h via RESPIRATORY_TRACT
  Filled 2015-08-19: qty 20

## 2015-08-19 MED ORDER — SODIUM CHLORIDE 0.9 % IV SOLN
INTRAVENOUS | Status: DC
  Administered 2015-08-19: 21:00:00 via INTRAVENOUS

## 2015-08-19 MED ORDER — FUROSEMIDE 40 MG PO TABS
40.0000 mg | ORAL_TABLET | Freq: Every day | ORAL | Status: DC
  Administered 2015-08-20 – 2015-08-22 (×3): 40 mg via ORAL
  Filled 2015-08-19 (×3): qty 1

## 2015-08-19 MED ORDER — TAMSULOSIN HCL 0.4 MG PO CAPS
0.4000 mg | ORAL_CAPSULE | Freq: Every day | ORAL | Status: DC
  Administered 2015-08-21 (×2): 0.4 mg via ORAL
  Filled 2015-08-19 (×2): qty 1

## 2015-08-19 MED ORDER — OXYCODONE HCL 5 MG PO TABS: 5.0000 mg | ORAL_TABLET | ORAL | Status: DC | PRN

## 2015-08-19 MED ORDER — ONDANSETRON HCL 4 MG PO TABS: 4.0000 mg | ORAL_TABLET | Freq: Four times a day (QID) | ORAL | Status: DC | PRN

## 2015-08-19 MED ORDER — ALBUTEROL SULFATE (2.5 MG/3ML) 0.083% IN NEBU: 2.5000 mg | INHALATION_SOLUTION | Freq: Four times a day (QID) | RESPIRATORY_TRACT | Status: AC | PRN

## 2015-08-19 MED ORDER — FUROSEMIDE 10 MG/ML IJ SOLN
20.0000 mg | Freq: Every day | INTRAMUSCULAR | Status: DC
  Administered 2015-08-19: 20 mg via INTRAVENOUS
  Filled 2015-08-19: qty 2

## 2015-08-19 MED ORDER — DIAZEPAM 5 MG/ML IJ SOLN
5.0000 mg | Freq: Once | INTRAMUSCULAR | Status: AC
  Administered 2015-08-19: 5 mg via INTRAVENOUS
  Filled 2015-08-19: qty 2

## 2015-08-19 MED ORDER — CETYLPYRIDINIUM CHLORIDE 0.05 % MT LIQD
7.0000 mL | Freq: Two times a day (BID) | OROMUCOSAL | Status: DC
  Administered 2015-08-20 – 2015-08-22 (×3): 7 mL via OROMUCOSAL

## 2015-08-19 MED ORDER — CHLORHEXIDINE GLUCONATE 0.12 % MT SOLN
15.0000 mL | Freq: Two times a day (BID) | OROMUCOSAL | Status: DC
  Administered 2015-08-20: 15 mL via OROMUCOSAL
  Filled 2015-08-19 (×5): qty 15

## 2015-08-19 MED ORDER — LEVOFLOXACIN IN D5W 750 MG/150ML IV SOLN
750.0000 mg | INTRAVENOUS | Status: DC
  Administered 2015-08-19 – 2015-08-21 (×3): 750 mg via INTRAVENOUS
  Filled 2015-08-19 (×4): qty 150

## 2015-08-19 MED ORDER — BUDESONIDE-FORMOTEROL FUMARATE 160-4.5 MCG/ACT IN AERO
2.0000 | INHALATION_SPRAY | Freq: Two times a day (BID) | RESPIRATORY_TRACT | Status: DC
  Administered 2015-08-20 – 2015-08-22 (×5): 2 via RESPIRATORY_TRACT
  Filled 2015-08-19: qty 6

## 2015-08-19 MED ORDER — NICOTINE 21 MG/24HR TD PT24
21.0000 mg | MEDICATED_PATCH | Freq: Every day | TRANSDERMAL | Status: DC
  Administered 2015-08-20: 21 mg via TRANSDERMAL
  Filled 2015-08-19 (×4): qty 1

## 2015-08-19 MED ORDER — ENOXAPARIN SODIUM 40 MG/0.4ML ~~LOC~~ SOLN
40.0000 mg | SUBCUTANEOUS | Status: DC
  Administered 2015-08-19: 40 mg via SUBCUTANEOUS
  Filled 2015-08-19 (×3): qty 0.4

## 2015-08-19 MED ORDER — METHYLPREDNISOLONE SODIUM SUCC 125 MG IJ SOLR
125.0000 mg | Freq: Once | INTRAMUSCULAR | Status: AC
  Administered 2015-08-19: 125 mg via INTRAVENOUS
  Filled 2015-08-19: qty 2

## 2015-08-19 MED ORDER — IPRATROPIUM-ALBUTEROL 0.5-2.5 (3) MG/3ML IN SOLN
3.0000 mL | RESPIRATORY_TRACT | Status: DC
  Administered 2015-08-19 – 2015-08-20 (×3): 3 mL via RESPIRATORY_TRACT
  Filled 2015-08-19 (×3): qty 3

## 2015-08-19 MED ORDER — ACETAMINOPHEN 325 MG PO TABS: 650.0000 mg | ORAL_TABLET | Freq: Four times a day (QID) | ORAL | Status: DC | PRN

## 2015-08-19 MED ORDER — ACETAMINOPHEN 650 MG RE SUPP: 650.0000 mg | Freq: Four times a day (QID) | RECTAL | Status: DC | PRN

## 2015-08-19 MED ORDER — MAGNESIUM SULFATE 2 GM/50ML IV SOLN
INTRAVENOUS | Status: AC
  Filled 2015-08-19: qty 50

## 2015-08-19 MED ORDER — MAGNESIUM SULFATE 2 GM/50ML IV SOLN
2.0000 g | Freq: Once | INTRAVENOUS | Status: AC
  Administered 2015-08-19: 2 g via INTRAVENOUS
  Filled 2015-08-19 (×2): qty 50

## 2015-08-19 MED ORDER — SODIUM CHLORIDE 0.9 % IJ SOLN
3.0000 mL | Freq: Two times a day (BID) | INTRAMUSCULAR | Status: DC
  Administered 2015-08-19 – 2015-08-22 (×5): 3 mL via INTRAVENOUS

## 2015-08-19 MED ORDER — METHYLPREDNISOLONE SODIUM SUCC 125 MG IJ SOLR
60.0000 mg | Freq: Four times a day (QID) | INTRAMUSCULAR | Status: DC
  Administered 2015-08-19 – 2015-08-22 (×10): 60 mg via INTRAVENOUS
  Filled 2015-08-19 (×11): qty 2

## 2015-08-19 MED ORDER — ASPIRIN EC 81 MG PO TBEC
81.0000 mg | DELAYED_RELEASE_TABLET | Freq: Every day | ORAL | Status: DC
  Filled 2015-08-19 (×4): qty 1

## 2015-08-19 MED ORDER — ONDANSETRON HCL 4 MG/2ML IJ SOLN
4.0000 mg | Freq: Four times a day (QID) | INTRAMUSCULAR | Status: DC | PRN
  Administered 2015-08-20: 4 mg via INTRAVENOUS
  Filled 2015-08-19: qty 2

## 2015-08-19 MED ORDER — LEVOFLOXACIN IN D5W 500 MG/100ML IV SOLN
500.0000 mg | Freq: Once | INTRAVENOUS | Status: AC
  Administered 2015-08-19: 500 mg via INTRAVENOUS
  Filled 2015-08-19: qty 100

## 2015-08-19 NOTE — ED Notes (Signed)
Pt still refuses CXR

## 2015-08-19 NOTE — Progress Notes (Signed)
Patient refuses to take aspirin and states "he does not need" Lovenox.
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
## 2015-08-19 NOTE — ED Notes (Signed)
Pt refused port. CXR

## 2015-08-19 NOTE — Progress Notes (Signed)
Lab tech trying to get blood from the patient and he refused and cursed at her to leave the room.  Informed charge nurse who has taken over the patient.
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
## 2015-08-19 NOTE — Progress Notes (Signed)
ANTIBIOTIC CONSULT NOTE - INITIAL  Pharmacy Consult for Levaquin Indication: pneumonia  Allergies  Allergen Reactions  . Tiotropium Bromide Monohydrate Nausea And Vomiting, Other (See Comments) and Nausea Only    Tremors Reaction:  Tremors  . Morphine And Related Other (See Comments)    Reaction:  Hallucinations     Patient Measurements: Height: 5\' 9"  (175.3 cm) Weight: 170 lb (77.111 kg) IBW/kg (Calculated) : 70.7 Adjusted Body Weight:   Vital Signs: Temp: 97.8 F (36.6 C) (10/22 1530) Temp Source: Oral (10/22 1530) BP: 129/57 mmHg (10/22 1755) Pulse Rate: 80 (10/22 1755) Intake/Output from previous day:   Intake/Output from this shift:    Labs:  Recent Labs  08/19/15 1550  WBC 15.8*  HGB 14.8  PLT 190  CREATININE 0.72   Estimated Creatinine Clearance: 87.1 mL/min (by C-G formula based on Cr of 0.72). No results for input(s): VANCOTROUGH, VANCOPEAK, VANCORANDOM, GENTTROUGH, GENTPEAK, GENTRANDOM, TOBRATROUGH, TOBRAPEAK, TOBRARND, AMIKACINPEAK, AMIKACINTROU, AMIKACIN in the last 72 hours.   Microbiology: Recent Results (from the past 720 hour(s))  MRSA PCR Screening     Status: None   Collection Time: 08/12/15 10:30 PM  Result Value Ref Range Status   MRSA by PCR NEGATIVE NEGATIVE Final    Comment:        The GeneXpert MRSA Assay (FDA approved for NASAL specimens only), is one component of a comprehensive MRSA colonization surveillance program. It is not intended to diagnose MRSA infection nor to guide or monitor treatment for MRSA infections.     Medical History: Past Medical History  Diagnosis Date  . Severe aortic valve stenosis     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 [facility]  . COPD (chronic obstructive pulmonary disease) (HCC)   . Thoracic ascending aortic aneurysm (HCC)     [facility]  . COPD (chronic obstructive pulmonary disease) (HCC)   . BPH (benign prostatic hyperplasia)   . Pacemaker     [facility]  . SSS (sick sinus syndrome) (HCC)     s/p PPM   . CAD (coronary artery disease)     ?MI in 2007  . Pneumothorax     traumatic 2007  . Anxiety and depression   . Psychosis   . Bipolar disorder (HCC)   . CHF (congestive heart failure) (HCC)   . Neuropathic pain   . Homelessness   . Tobacco abuse     Medications:  Scheduled:  . [START ON 08/20/2015] antiseptic oral rinse  7 mL Mouth Rinse q12n4p  . aspirin EC  81 mg Oral Daily  . budesonide-formoterol  2 puff Inhalation BID  . chlorhexidine  15 mL Mouth Rinse BID  . enoxaparin (LOVENOX) injection  40 mg Subcutaneous Q24H  . furosemide  40 mg Intravenous Once  . [START ON 08/20/2015] furosemide  40 mg Oral Daily  . ipratropium-albuterol  3 mL Nebulization Q4H  . [START ON 08/20/2015] levofloxacin (LEVAQUIN) IV  750 mg Intravenous Q24H  . magnesium sulfate      . magnesium sulfate 1 - 4 g bolus IVPB  2 g Intravenous Once  . methylPREDNISolone (SOLU-MEDROL) injection  60 mg Intravenous Q6H  . nicotine  21 mg Transdermal Daily  . sodium chloride  3 mL Intravenous Q12H  . tamsulosin  0.4 mg Oral QHS   Assessment: 69 yo male, acute respiratory failure. Presumed HCAP Levaquin 500 mg IV given in ED CrCl 87.1 ml/min  Goal of Therapy:  Eradicate infection  Plan:  Levaquin  750 mg IV every 24 hours Monitor renal function Labs per protocol F/U oral therapy when appropriate Raquel James, Michaeljoseph Revolorio Bennett 08/19/2015,6:43 PM

## 2015-08-19 NOTE — ED Notes (Signed)
CRITICAL VALUE ALERT  Critical value received:  Ph 7.27, PCO2 71.0, Po2 103, bicarb 27.2, So2 97.3  Date of notification:  08/19/15  Time of notification:  1622  Critical value read back:Yes.    Nurse who received alert:  B. Garner Nashaniels, RN  MD notified (1st page):  Rancour  Time of first page:  1623  MD notified (2nd page):  Time of second page:  Responding MD:  Rancour  Time MD responded:  [id number]

## 2015-08-19 NOTE — Progress Notes (Signed)
Patient evaluated multiple times after admission due to his agitated state and belligerence towards the nursing staff. At time of initial secondary evaluation at 8:30 patient was awake and alert and able to hold a conversation. Time patient was extremely rude to both myself and the nursing staff using many vulgarities, crude statements, and threats of bodily harm. Of note patient is alert and oriented and aware of his admission. He currently states he did not want to come to independent hospital and was here only because he was forced to by EMS. Patient at times stating he wants every just leave him alone better and also requests for his medications as well as respiratory intervention at times. Patient very adamant at this point time that he does not want to be resuscitated if he were to decompensate significantly and suffered a fatal arrhythmia, or respiratory collapse. Patient aware that he is argumentative state and noncompliance with BiPAP or other recommended treatments may result in his death. Patient states that he is not scared of death and would welcome at. Patient is only able to maintain this level consciousness for a period of time before he becomes somewhat unresponsive due to his hypoxic and hypercapnic state at which time BiPAP is placed on the patient and he tolerates it. I made a single dose of Valium 5 mg available to the nursing staff if needed for significant belligerent behavior. Security and [facility] have been made aware the patient as he can become somewhat violent. Nursing staff will have security available to them so as to protect themselves while interacting with this patient. Of note patient has had 4 admissions this month for similar symptoms and signed out AMA after one day of treatment from his last admission. Of note patient has had a very difficult life and may benefit from a social work consult the question whether or not he would be amenable to this. At this time I feel  patient has complete capacity during his times of being awake and again reiterated that current therapies are to be continued but there is to be no escalation of therapy should the patient decompensate as the patient has made this very clear that this is his desire.  Shelly Flattenavid Shaquill Iseman, MD Family Medicine 08/19/2015, 9:58 PM

## 2015-08-19 NOTE — Discharge Summary (Signed)
Patient left the hospital AMA today before I had assessed him. No prescriptions were provided.  Peggye PittEstela Hernandez, MD [facility] Pager: [phone]

## 2015-08-19 NOTE — ED Notes (Signed)
Pt with sob today, hx copd, unable to use inhaler due to med in truck that was reported towed last night

## 2015-08-19 NOTE — Progress Notes (Signed)
Patient demanding extra food during shift change. After checking the patient's diet status he was brought ice cream.  Upon entering his room and offering him the ice cream, I tried to discuss his need for an peripheral IV to get his medications and I would try after it.  Patient immediately said "I am going to eat and your going to get the hell out of my room."  I stated to the patient that I was going to try his IV after his ice cream.  Patient stated "I don't care you are not going to do what you want I will do anything when I am damn ready."  Then the patient proceeded to call me a "Bitch" and stated "to get out of his room and I am not doing anything that you want."  Dr. Konrad DoloresMerrell  Informed of the situation and new nurse assigned.  Per report patient was refusing care since admission and Bipap upon arrival to the floor.

## 2015-08-19 NOTE — ED Notes (Signed)
Reported that pt refused BiPap

## 2015-08-19 NOTE — Progress Notes (Signed)
Respiratory Care Note: The patient refused to remove his dentures and also refused to let me put him BIPAP. He raised his hand to me and cursed at me. Security had to step into the room with me. The MD and RN aware.

## 2015-08-20 ENCOUNTER — Inpatient Hospital Stay (HOSPITAL_COMMUNITY): Payer: Medicare HMO

## 2015-08-20 DIAGNOSIS — I5022 Chronic systolic (congestive) heart failure: Secondary | ICD-10-CM

## 2015-08-20 DIAGNOSIS — J9622 Acute and chronic respiratory failure with hypercapnia: Secondary | ICD-10-CM

## 2015-08-20 DIAGNOSIS — F319 Bipolar disorder, unspecified: Secondary | ICD-10-CM

## 2015-08-20 DIAGNOSIS — J441 Chronic obstructive pulmonary disease with (acute) exacerbation: Principal | ICD-10-CM

## 2015-08-20 DIAGNOSIS — G9340 Encephalopathy, unspecified: Secondary | ICD-10-CM

## 2015-08-20 LAB — CBC
HCT: 41.1 % (ref 39.0–52.0)
Hemoglobin: 12.8 g/dL — ABNORMAL LOW (ref 13.0–17.0)
MCH: 31.9 pg (ref 26.0–34.0)
MCHC: 31.1 g/dL (ref 30.0–36.0)
MCV: 102.5 fL — ABNORMAL HIGH (ref 78.0–100.0)
Platelets: 182 10*3/uL (ref 150–400)
RBC: 4.01 MIL/uL — ABNORMAL LOW (ref 4.22–5.81)
RDW: 15 % (ref 11.5–15.5)
WBC: 7.7 10*3/uL (ref 4.0–10.5)

## 2015-08-20 LAB — COMPREHENSIVE METABOLIC PANEL
ALT: 21 U/L (ref 17–63)
AST: 14 U/L — ABNORMAL LOW (ref 15–41)
Albumin: 3 g/dL — ABNORMAL LOW (ref 3.5–5.0)
Alkaline Phosphatase: 57 U/L (ref 38–126)
Anion gap: 5 (ref 5–15)
BUN: 20 mg/dL (ref 6–20)
CO2: 33 mmol/L — ABNORMAL HIGH (ref 22–32)
Calcium: 8.2 mg/dL — ABNORMAL LOW (ref 8.9–10.3)
Chloride: 99 mmol/L — ABNORMAL LOW (ref 101–111)
Creatinine, Ser: 0.64 mg/dL (ref 0.61–1.24)
GFR calc Af Amer: >60 mL/min (ref >60)
GFR calc non Af Amer: >60 mL/min (ref >60)
Glucose, Bld: 109 mg/dL — ABNORMAL HIGH (ref 65–99)
Potassium: 4.5 mmol/L (ref 3.5–5.1)
Sodium: 137 mmol/L (ref 135–145)
Total Bilirubin: 0.6 mg/dL (ref 0.3–1.2)
Total Protein: 5.3 g/dL — ABNORMAL LOW (ref 6.5–8.1)

## 2015-08-20 LAB — RAPID URINE DRUG SCREEN, HOSP PERFORMED
Amphetamines: NOT DETECTED
Barbiturates: NOT DETECTED
Benzodiazepines: NOT DETECTED
Cocaine: NOT DETECTED
Opiates: NOT DETECTED
Tetrahydrocannabinol: NOT DETECTED

## 2015-08-20 LAB — BLOOD GAS, ARTERIAL
Acid-Base Excess: 7.4 mmol/L — ABNORMAL HIGH (ref 0.0–2.0)
Bicarbonate: 29.6 mEq/L — ABNORMAL HIGH (ref 20.0–24.0)
Drawn by: 234301
O2 Content: 2 L/min
O2 Saturation: 94.1 %
pCO2 arterial: 63.8 mmHg (ref 35.0–45.0)
pH, Arterial: 7.336 — ABNORMAL LOW (ref 7.350–7.450)
pO2, Arterial: 72.3 mmHg — ABNORMAL LOW (ref 80.0–100.0)

## 2015-08-20 LAB — LACTIC ACID, PLASMA: Lactic Acid, Venous: 1.3 mmol/L (ref 0.5–2.0)

## 2015-08-20 MED ORDER — IPRATROPIUM-ALBUTEROL 0.5-2.5 (3) MG/3ML IN SOLN
3.0000 mL | Freq: Four times a day (QID) | RESPIRATORY_TRACT | Status: DC
  Administered 2015-08-20 – 2015-08-21 (×5): 3 mL via RESPIRATORY_TRACT
  Filled 2015-08-20 (×4): qty 3

## 2015-08-20 MED ORDER — ALBUTEROL SULFATE (2.5 MG/3ML) 0.083% IN NEBU: 2.5000 mg | INHALATION_SOLUTION | Freq: Four times a day (QID) | RESPIRATORY_TRACT | Status: DC

## 2015-08-20 MED ORDER — IPRATROPIUM BROMIDE 0.02 % IN SOLN: 0.5000 mg | Freq: Four times a day (QID) | RESPIRATORY_TRACT | Status: DC

## 2015-08-20 NOTE — Progress Notes (Signed)
Pt refuses to wear armband and insists that I cut it off of his arm.  Placed at computer station in room.  Pt to be transferred to M/S bed, so tele removed at pt request.

## 2015-08-20 NOTE — Progress Notes (Signed)
ABG ordered for pt.Went to room and told patient what i needed to do, started cussing saying he was not doing anything until he used bathroom, i assured him as did the nurse that he had a catheter in. Patient was still cussing. Will try again later.

## 2015-08-20 NOTE — Progress Notes (Signed)
Patient is demanding and keeps complaining that we give him his horn so he can work on coughing up his secretions and breathing. RT and nurse have brought in and incentive spirometer and blue and green flutter devices. He states these are not what we gave him before. States he doesn't want these. Suspect he is talking about a type of flutter we don't have but he refuses what we have given him.

## 2015-08-20 NOTE — Progress Notes (Signed)
[facility] PROGRESS NOTE  [name] [name] JYN:[id number] DOB: [birth year] DOA: 08/19/2015 PCP: [name] November, MD  Assessment/Plan: Acute Hypoxemic/Hypercarbic Respiratory Failure -2/2 COPD with acute exacerbation. -Refuses CXR/repeat ABG, at times refuses nebs. -Known to be non-compliant and has signed out AMA on multiple occasions, has also been belligerent and combative with staff. -Did wear BiPap transiently last night, suspect he may well need BiPap again. -Oxygen supplementation as needed. -Continue nebs/steroids/levaquin as he allows. -Will try to repeat ABG and obtain a CXR today if he is agreeable. -Is a DNR.  Chronic Combined CHF -Appears compensated at present. ECHO from 12/15: EF 40-45% with concentric hypertrophy. -Has a PPM.  Tobacco Abuse -Nicotine patch.  BPH -Continue flomax.  Code Status: DNR Family Communication: Patient only  Disposition Plan: To be determined. Keep in SDU today.   Consultants:  None   Antibiotics:  Levaquin   Subjective: Sleeping. Was combative and rude to staff earlier this am.  Objective: Filed Vitals:   08/20/15 0530 08/20/15 0600 08/20/15 0630 08/20/15 0834  BP: 97/65 82/50 115/55   Pulse: 80 80 80   Temp:      TempSrc:      Resp: Height:      Weight:      SpO2: 100% 99% 100% 93%    Intake/Output Summary (Last 24 hours) at 08/20/15 1009 Last data filed at 08/20/15 0600  Gross per 24 hour  Intake 578.33 ml  Output    400 ml  Net 178.33 ml   Filed Weights   08/19/15 1530 08/20/15 0500  Weight: 77.111 kg (170 lb) 76.6 kg (168 lb 14 oz)    Exam:   General:  sleeping  Cardiovascular: RRR  Respiratory: Diffuse bilateral expiratory wheezes  Abdomen: S/NT/ND/+BS  Extremities: no C/C/E   Neurologic:  Non-focal  Data Reviewed: Basic Metabolic Panel:  Recent Labs Lab 08/19/15 1550 08/20/15 0432  NA 137 137  K 4.2 4.5  CL 98* 99*  CO2 32 33*  GLUCOSE 94 109*  BUN 18 20    CREATININE 0.72 0.64  CALCIUM 8.8* 8.2*   Liver Function Tests:  Recent Labs Lab 08/20/15 0432  AST 14*  ALT 21  ALKPHOS 57  BILITOT 0.6  PROT 5.3*  ALBUMIN 3.0*   No results for input(s): LIPASE, AMYLASE in the last 168 hours. No results for input(s): AMMONIA in the last 168 hours. CBC:  Recent Labs Lab 08/19/15 1550 08/20/15 0432  WBC 15.8* 7.7  NEUTROABS 12.4*  --   HGB 14.8 12.8*  HCT 45.1 41.1  MCV 101.3* 102.5*  PLT 190 182   Cardiac Enzymes:  Recent Labs Lab 08/19/15 1550  TROPONINI <0.03   BNP (last 3 results)  Recent Labs  01/05/15 0950 03/06/15 1657 08/19/15 1550  BNP 94.0 92.0 100.0    ProBNP (last 3 results)  Recent Labs  09/01/14 1722 09/04/14 2121 09/24/14 0704  PROBNP 653.8* 333.0* 798.5*    CBG:  Recent Labs Lab 08/15/15 0853  GLUCAP 187*    Recent Results (from the past 240 hour(s))  MRSA PCR Screening     Status: None   Collection Time: 08/12/15 10:30 PM  Result Value Ref Range Status   MRSA by PCR NEGATIVE NEGATIVE Final    Comment:        The GeneXpert MRSA Assay (FDA approved for NASAL specimens only), is one component of a comprehensive MRSA colonization surveillance program. It is not intended to diagnose MRSA infection nor  to guide or monitor treatment for MRSA infections.      Studies: No results found.  Scheduled Meds: . antiseptic oral rinse  7 mL Mouth Rinse q12n4p  . aspirin EC  81 mg Oral Daily  . budesonide-formoterol  2 puff Inhalation BID  . chlorhexidine  15 mL Mouth Rinse BID  . enoxaparin (LOVENOX) injection  40 mg Subcutaneous Q24H  . furosemide  20 mg Intravenous Daily  . furosemide  40 mg Oral Daily  . ipratropium-albuterol  3 mL Nebulization Q6H  . levofloxacin (LEVAQUIN) IV  750 mg Intravenous Q24H  . methylPREDNISolone (SOLU-MEDROL) injection  60 mg Intravenous Q6H  . nicotine  21 mg Transdermal Daily  . sodium chloride  3 mL Intravenous Q12H  . tamsulosin  0.4 mg Oral QHS    Continuous Infusions: . sodium chloride 50 mL/hr at 08/20/15 0600    Principal Problem:   Respiratory failure with hypercapnia (HCC) Active Problems:   COPD exacerbation (HCC)   H/O aortic valve replacement   History of permanent cardiac pacemaker placement   Bipolar 1 disorder (HCC)   Tobacco abuse   Homelessness   Chronic systolic CHF (congestive heart failure) (HCC)   Acute encephalopathy   Chronic obstructive pulmonary disease with acute exacerbation (HCC)    Time spent: 35 minutes. Greater than 50% of this time was spent in direct contact with the patient coordinating care.    [name] [name],[name] [name]  [facility] Pager [phone]  If 7PM-7AM, please contact night-coverage at [email or web address], password [email or web address] 08/20/2015, 10:09 AM  LOS: 1 day

## 2015-08-21 DIAGNOSIS — J9602 Acute respiratory failure with hypercapnia: Secondary | ICD-10-CM

## 2015-08-21 MED ORDER — IPRATROPIUM BROMIDE 0.02 % IN SOLN
RESPIRATORY_TRACT | Status: AC
  Filled 2015-08-21: qty 2.5

## 2015-08-21 MED ORDER — IPRATROPIUM-ALBUTEROL 0.5-2.5 (3) MG/3ML IN SOLN: 3.0000 mL | RESPIRATORY_TRACT | Status: DC | PRN

## 2015-08-21 MED ORDER — LEVOFLOXACIN 750 MG PO TABS
750.0000 mg | ORAL_TABLET | Freq: Every day | ORAL | Status: DC
  Administered 2015-08-22: 750 mg via ORAL
  Filled 2015-08-21: qty 1

## 2015-08-21 MED ORDER — IPRATROPIUM-ALBUTEROL 0.5-2.5 (3) MG/3ML IN SOLN
3.0000 mL | RESPIRATORY_TRACT | Status: DC
  Administered 2015-08-21 – 2015-08-22 (×7): 3 mL via RESPIRATORY_TRACT
  Filled 2015-08-21 (×7): qty 3

## 2015-08-21 MED ORDER — IPRATROPIUM-ALBUTEROL 0.5-2.5 (3) MG/3ML IN SOLN: 3.0000 mL | Freq: Four times a day (QID) | RESPIRATORY_TRACT | Status: DC

## 2015-08-21 MED ORDER — ALBUTEROL SULFATE (2.5 MG/3ML) 0.083% IN NEBU
INHALATION_SOLUTION | RESPIRATORY_TRACT | Status: AC
  Filled 2015-08-21: qty 3

## 2015-08-21 NOTE — Care Management Important Message (Signed)
Important Message  Patient Details  Name: [name] [name] MRN: [id number] Date of Birth: 10/09/1946   Medicare Important Message Given:  N/A - LOS <3 / Initial given by admissions    Malcolm Metrohildress, Kaylah Chiasson Demske, RN 08/21/2015, 3:36 PM

## 2015-08-21 NOTE — Progress Notes (Signed)
ANTIBIOTIC CONSULT NOTE   Pharmacy Consult for Levaquin Indication: pneumonia  Allergies  Allergen Reactions  . Tiotropium Bromide Monohydrate Nausea And Vomiting, Other (See Comments) and Nausea Only    Tremors Reaction:  Tremors  . Morphine And Related Other (See Comments)    Reaction:  Hallucinations    Patient Measurements: Height:  (175.3 cm) Weight: 168 lb 14 oz (76.6 kg) IBW/kg (Calculated) : 70.7  Vital Signs:   Intake/Output from previous day: 10/23 0701 - 10/24 0700 In: 1460 [P.O.:960; I.V.:350; IV Piggyback:150] Out: 1350 [Urine:1350] Intake/Output from this shift: Total I/O In: 993 [P.O.:840; I.V.:3; IV Piggyback:150] Out: 450 [Urine:450]  Labs:  Recent Labs  08/19/15 1550 08/20/15 0432  WBC 15.8* 7.7  HGB 14.8 12.8*  PLT 190 182  CREATININE 0.72 0.64   Estimated Creatinine Clearance: 87.1 mL/min (by C-G formula based on Cr of 0.64). No results for input(s): VANCOTROUGH, VANCOPEAK, VANCORANDOM, GENTTROUGH, GENTPEAK, GENTRANDOM, TOBRATROUGH, TOBRAPEAK, TOBRARND, AMIKACINPEAK, AMIKACINTROU, AMIKACIN in the last 72 hours.   Microbiology: Recent Results (from the past 720 hour(s))  MRSA PCR Screening     Status: None   Collection Time: 08/12/15 10:30 PM  Result Value Ref Range Status   MRSA by PCR NEGATIVE NEGATIVE Final    Comment:        The GeneXpert MRSA Assay (FDA approved for NASAL specimens only), is one component of a comprehensive MRSA colonization surveillance program. It is not intended to diagnose MRSA infection nor to guide or monitor treatment for MRSA infections.    Medical History: Past Medical History  Diagnosis Date  . Severe aortic valve stenosis     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 at [facility]  . COPD (chronic obstructive pulmonary disease) (HCC)   . Thoracic ascending aortic aneurysm (HCC)     repaired 06/2014 at [facility]  . COPD (chronic obstructive pulmonary disease) (HCC)   . BPH (benign prostatic hyperplasia)   .  Pacemaker     battery replaced 06/2014 at [facility]  . SSS (sick sinus syndrome) (HCC)     s/p PPM   . CAD (coronary artery disease)     ?MI in 2007  . Pneumothorax     traumatic 2007  . Anxiety and depression   . Psychosis   . Bipolar disorder (HCC)   . CHF (congestive heart failure) (HCC)   . Neuropathic pain   . Homelessness   . Tobacco abuse    Medications:  Scheduled:  . albuterol      . antiseptic oral rinse  7 mL Mouth Rinse q12n4p  . aspirin EC  81 mg Oral Daily  . budesonide-formoterol  2 puff Inhalation BID  . enoxaparin (LOVENOX) injection  40 mg Subcutaneous Q24H  . furosemide  40 mg Oral Daily  . ipratropium      . ipratropium-albuterol  3 mL Nebulization Q4H  . [START ON 08/22/2015] levofloxacin  750 mg Oral Daily  . methylPREDNISolone (SOLU-MEDROL) injection  60 mg Intravenous Q6H  . nicotine  21 mg Transdermal Daily  . sodium chloride  3 mL Intravenous Q12H  . tamsulosin  0.4 mg Oral QHS   Assessment: 69 yo male, acute respiratory failure. Presumed HCAP Estimated Creatinine Clearance: 87.1 mL/min (by C-G formula based on Cr of 0.64).  Goal of Therapy:  Eradicate infection  PHARMACIST - PHYSICIAN COMMUNICATION CONCERNING: Antibiotic IV to Oral Route Change Policy  RECOMMENDATION: This patient is receiving Levaquin by the intravenous route.  Based on criteria approved by the Pharmacy and Therapeutics  Committee, the antibiotic(s) is/are being converted to the equivalent oral dose form(s).  DESCRIPTION: These criteria include:  Patient being treated for a respiratory tract infection, urinary tract infection, cellulitis or clostridium difficile associated diarrhea if on metronidazole  The patient is not neutropenic and does not exhibit a GI malabsorption state  The patient is eating (either orally or via tube) and/or has been taking other orally administered medications for a least 24 hours  The patient is improving clinically and has a Tmax <  100.5  If you have questions about this conversion, please contact the Pharmacy Department  [x]   [phone]( [phone] )  [facility] []   [phone]( [phone] )  [facility] []   [phone]( [phone] )  [facility] []   [phone]( [phone] )  [facility] []   [phone]( [phone] )  [facility]   Tyresha Fede, [place] A 08/21/2015,1:47 PM

## 2015-08-21 NOTE — Care Management Note (Signed)
Case Management Note  Patient Details  Name: [name] [name] MRN: [id number] Date of Birth: 12/10/1945  Subjective/Objective:                  Pt is from home and ind at baseline.   Action/Plan: Pt discharging home today. Pt does not meet requirements for home O2. No CM needs noted at DC.   Expected Discharge Date:  08/21/15               Expected Discharge Plan:  Home/Self Care  In-House Referral:  NA  Discharge planning Services  CM Consult  Post Acute Care Choice:  NA Choice offered to:  NA  DME Arranged:    DME Agency:     HH Arranged:    HH Agency:     Status of Service:  Completed, signed off  Medicare Important Message Given:  N/A - LOS <3 / Initial given by admissions Date Medicare IM Given:    Medicare IM give by:    Date Additional Medicare IM Given:    Additional Medicare Important Message give by:     If discussed at Long Length of Stay Meetings, dates discussed:    Additional Comments:  Malcolm MetroChildress, Charnetta Wulff Demske, RN 08/21/2015, 3:37 PM

## 2015-08-21 NOTE — Progress Notes (Signed)
SATURATION QUALIFICATIONS: (This note is used to comply with regulatory documentation for home oxygen)  Patient Saturations on Room Air at Rest = 99%  Patient Saturations on Room Air while Ambulating = 93%  Patient Saturations on Room Air recovery after ambulation  = 96%

## 2015-08-21 NOTE — Progress Notes (Signed)
Patient given albuterol  / atrovent treatment at 0500 am , pulled meds did not show up on mar as cabinet override.

## 2015-08-21 NOTE — Progress Notes (Signed)
Patient finally accepted use of flutter valve by nurse.

## 2015-08-21 NOTE — Progress Notes (Signed)
[facility] PROGRESS NOTE  JAKAIDEN FILL ZOX:[id number] DOB: [birth year] DOA: 08/19/2015 PCP: Daryel November, MD  Assessment/Plan: Acute Hypoxemic/Hypercarbic Respiratory Failure -2/2 COPD with acute exacerbation. -Refuses CXR/repeat ABG, at times refuses nebs. -Known to be non-compliant and has signed out AMA on multiple occasions, has also been belligerent and combative with staff. -Is improving but still has significant expiratory wheezes. -Oxygen supplementation as needed. -Continue nebs/steroids/levaquin as he allows. -Will try to repeat ABG and obtain a CXR today if he is agreeable (he has not so far). -Is a DNR.  Chronic Combined CHF -Appears compensated at present. ECHO from 12/15: EF 40-45% with concentric hypertrophy. -Has a PPM.  Tobacco Abuse -Nicotine patch.  BPH -Continue flomax.  Code Status: DNR Family Communication: Patient only  Disposition Plan: Home when ready; likely in 24-48 hours.   Consultants:  None   Antibiotics:  Levaquin   Subjective: Still feels SOB. Doesn't feel ready to go home. Is very rude, making crude statements to staff.  Objective: Filed Vitals:   08/21/15 0827 08/21/15 1240 08/21/15 1401 08/21/15 1550  BP:   110/64   Pulse:   84   Temp:   98.6 F (37 C)   TempSrc:   Oral   Resp:   22   Height:      Weight:      SpO2: 92% 97% 95% 98%    Intake/Output Summary (Last 24 hours) at 08/21/15 1611 Last data filed at 08/21/15 1447  Gross per 24 hour  Intake    993 ml  Output   2325 ml  Net  -1332 ml   Filed Weights   08/19/15 1530 08/20/15 0500  Weight: 77.111 kg (170 lb) 76.6 kg (168 lb 14 oz)    Exam:   General:  AA ox3  Cardiovascular: RRR  Respiratory: Diffuse bilateral expiratory wheezing  Abdomen: S/NT/ND/+BS  Extremities: no C/C/E   Neurologic:  Grossly intact and non-focal  Data Reviewed: Basic Metabolic Panel:  Recent Labs Lab 08/19/15 1550 08/20/15 0432  NA 137 137  K 4.2 4.5   CL 98* 99*  CO2 32 33*  GLUCOSE 94 109*  BUN 18 20  CREATININE 0.72 0.64  CALCIUM 8.8* 8.2*   Liver Function Tests:  Recent Labs Lab 08/20/15 0432  AST 14*  ALT 21  ALKPHOS 57  BILITOT 0.6  PROT 5.3*  ALBUMIN 3.0*   No results for input(s): LIPASE, AMYLASE in the last 168 hours. No results for input(s): AMMONIA in the last 168 hours. CBC:  Recent Labs Lab 08/19/15 1550 08/20/15 0432  WBC 15.8* 7.7  NEUTROABS 12.4*  --   HGB 14.8 12.8*  HCT 45.1 41.1  MCV 101.3* 102.5*  PLT 190 182   Cardiac Enzymes:  Recent Labs Lab 08/19/15 1550  TROPONINI <0.03   BNP (last 3 results)  Recent Labs  01/05/15 0950 03/06/15 1657 08/19/15 1550  BNP 94.0 92.0 100.0    ProBNP (last 3 results)  Recent Labs  09/01/14 1722 09/04/14 2121 09/24/14 0704  PROBNP 653.8* 333.0* 798.5*    CBG:  Recent Labs Lab 08/15/15 0853  GLUCAP 187*    Recent Results (from the past 240 hour(s))  MRSA PCR Screening     Status: None   Collection Time: 08/12/15 10:30 PM  Result Value Ref Range Status   MRSA by PCR NEGATIVE NEGATIVE Final    Comment:        The GeneXpert MRSA Assay (FDA approved for NASAL specimens only), is one component  of a comprehensive MRSA colonization surveillance program. It is not intended to diagnose MRSA infection nor to guide or monitor treatment for MRSA infections.      Studies: No results found.  Scheduled Meds: . albuterol      . antiseptic oral rinse  7 mL Mouth Rinse q12n4p  . aspirin EC  81 mg Oral Daily  . budesonide-formoterol  2 puff Inhalation BID  . enoxaparin (LOVENOX) injection  40 mg Subcutaneous Q24H  . furosemide  40 mg Oral Daily  . ipratropium      . ipratropium-albuterol  3 mL Nebulization Q4H  . [START ON 08/22/2015] levofloxacin  750 mg Oral Daily  . methylPREDNISolone (SOLU-MEDROL) injection  60 mg Intravenous Q6H  . nicotine  21 mg Transdermal Daily  . sodium chloride  3 mL Intravenous Q12H  . tamsulosin  0.4  mg Oral QHS   Continuous Infusions:   Principal Problem:   Respiratory failure with hypercapnia (HCC) Active Problems:   COPD exacerbation (HCC)   H/O aortic valve replacement   History of permanent cardiac pacemaker placement   Bipolar 1 disorder (HCC)   Tobacco abuse   Homelessness   Chronic systolic CHF (congestive heart failure) (HCC)   Acute encephalopathy   Chronic obstructive pulmonary disease with acute exacerbation (HCC)    Time spent: 25 minutes. Greater than 50% of this time was spent in direct contact with the patient coordinating care.    [name],[name]  [facility] Pager [phone]  If 7PM-7AM, please contact night-coverage at [email or web address], password [email or web address] 08/21/2015, 4:11 PM  LOS: 2 days

## 2015-08-21 NOTE — Clinical Social Work Note (Signed)
CSW received consult for medication assistance. CM aware. CSW will sign off.  Derenda FennelKara Lamanda Rudder, LCSW [phone]

## 2015-08-22 MED ORDER — ALBUTEROL SULFATE HFA 108 (90 BASE) MCG/ACT IN AERS: 2.0000 | INHALATION_SPRAY | Freq: Four times a day (QID) | RESPIRATORY_TRACT | Status: DC | PRN

## 2015-08-22 MED ORDER — BUDESONIDE-FORMOTEROL FUMARATE 80-4.5 MCG/ACT IN AERO: 2.0000 | INHALATION_SPRAY | Freq: Two times a day (BID) | RESPIRATORY_TRACT | Status: DC

## 2015-08-22 MED ORDER — LEVOFLOXACIN 750 MG PO TABS: 750.0000 mg | ORAL_TABLET | Freq: Every day | ORAL | Status: DC

## 2015-08-22 MED ORDER — PREDNISONE 10 MG PO TABS: 10.0000 mg | ORAL_TABLET | Freq: Every day | ORAL | Status: DC

## 2015-08-22 NOTE — Progress Notes (Signed)
Pt's IV catheter removed and intact. Pt's IV site clean dry and intact. Discharge instructions, follow up appointments and medications reviewed and discussed with patient. Pt verbalized understanding of discharge instructions. However, pt refused to sign discharge instructions. Pt in stable condition and in no acute distress at time of discharge. Pt verbally abusive and belligerent. Pt escorted by [employer].

## 2015-08-22 NOTE — Care Management Note (Signed)
Case Management Note  Patient Details  Name: [name] [name] MRN: [id number] Date of Birth: 10/10/1946  Subjective/Objective:                    Action/Plan: Pt discharged today, no CM needs.  Expected Discharge Date:  08/22/15               Expected Discharge Plan:  Home/Self Care  In-House Referral:  NA  Discharge planning Services  CM Consult  Post Acute Care Choice:  NA Choice offered to:  NA  DME Arranged:    DME Agency:     HH Arranged:    HH Agency:     Status of Service:  Completed, signed off  Medicare Important Message Given:  Yes-third notification given Date Medicare IM Given:    Medicare IM give by:    Date Additional Medicare IM Given:    Additional Medicare Important Message give by:     If discussed at Long Length of Stay Meetings, dates discussed:    Additional Comments:  Malcolm MetroChildress, Azeem Poorman Demske, RN 08/22/2015, 3:38 PM
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
## 2015-08-22 NOTE — Care Management Important Message (Signed)
Important Message  Patient Details  Name: Jennings Booksndrew H Gaertner MRN: [id number] Date of Birth: 09/24/1946   Medicare Important Message Given:  Yes-third notification given    Malcolm MetroChildress, Cyrilla Durkin Demske, RN 08/22/2015, 3:38 PM

## 2015-09-09 ENCOUNTER — Inpatient Hospital Stay (HOSPITAL_COMMUNITY)
Admission: EM | Admit: 2015-09-09 | Discharge: 2015-09-19 | DRG: 190 | Disposition: A | Discharge status: Home / Self Care | Payer: Medicare HMO | Attending: Internal Medicine | Admitting: Internal Medicine

## 2015-09-09 ENCOUNTER — Encounter (HOSPITAL_COMMUNITY): Payer: Self-pay

## 2015-09-09 ENCOUNTER — Emergency Department (HOSPITAL_COMMUNITY): Payer: Medicare HMO

## 2015-09-09 DIAGNOSIS — I350 Nonrheumatic aortic (valve) stenosis: Secondary | ICD-10-CM | POA: Diagnosis present

## 2015-09-09 DIAGNOSIS — F319 Bipolar disorder, unspecified: Secondary | ICD-10-CM | POA: Diagnosis present

## 2015-09-09 DIAGNOSIS — J9621 Acute and chronic respiratory failure with hypoxia: Secondary | ICD-10-CM | POA: Diagnosis present

## 2015-09-09 DIAGNOSIS — J9601 Acute respiratory failure with hypoxia: Secondary | ICD-10-CM | POA: Diagnosis not present

## 2015-09-09 DIAGNOSIS — J9610 Chronic respiratory failure, unspecified whether with hypoxia or hypercapnia: Secondary | ICD-10-CM | POA: Diagnosis present

## 2015-09-09 DIAGNOSIS — R0602 Shortness of breath: Secondary | ICD-10-CM | POA: Diagnosis not present

## 2015-09-09 DIAGNOSIS — I5022 Chronic systolic (congestive) heart failure: Secondary | ICD-10-CM | POA: Diagnosis present

## 2015-09-09 DIAGNOSIS — D696 Thrombocytopenia, unspecified: Secondary | ICD-10-CM | POA: Diagnosis present

## 2015-09-09 DIAGNOSIS — I5042 Chronic combined systolic (congestive) and diastolic (congestive) heart failure: Secondary | ICD-10-CM | POA: Diagnosis not present

## 2015-09-09 DIAGNOSIS — F17210 Nicotine dependence, cigarettes, uncomplicated: Secondary | ICD-10-CM | POA: Diagnosis present

## 2015-09-09 DIAGNOSIS — I2510 Atherosclerotic heart disease of native coronary artery without angina pectoris: Secondary | ICD-10-CM | POA: Diagnosis present

## 2015-09-09 DIAGNOSIS — T380X5A Adverse effect of glucocorticoids and synthetic analogues, initial encounter: Secondary | ICD-10-CM | POA: Diagnosis present

## 2015-09-09 DIAGNOSIS — J441 Chronic obstructive pulmonary disease with (acute) exacerbation: Secondary | ICD-10-CM | POA: Diagnosis not present

## 2015-09-09 DIAGNOSIS — J9602 Acute respiratory failure with hypercapnia: Secondary | ICD-10-CM | POA: Diagnosis present

## 2015-09-09 DIAGNOSIS — Z9119 Patient's noncompliance with other medical treatment and regimen: Secondary | ICD-10-CM

## 2015-09-09 DIAGNOSIS — Z8249 Family history of ischemic heart disease and other diseases of the circulatory system: Secondary | ICD-10-CM

## 2015-09-09 DIAGNOSIS — Z952 Presence of prosthetic heart valve: Secondary | ICD-10-CM

## 2015-09-09 DIAGNOSIS — R739 Hyperglycemia, unspecified: Secondary | ICD-10-CM | POA: Diagnosis present

## 2015-09-09 DIAGNOSIS — Z833 Family history of diabetes mellitus: Secondary | ICD-10-CM

## 2015-09-09 DIAGNOSIS — J9612 Chronic respiratory failure with hypercapnia: Secondary | ICD-10-CM | POA: Diagnosis present

## 2015-09-09 DIAGNOSIS — T50905A Adverse effect of unspecified drugs, medicaments and biological substances, initial encounter: Secondary | ICD-10-CM | POA: Diagnosis present

## 2015-09-09 DIAGNOSIS — Z720 Tobacco use: Secondary | ICD-10-CM | POA: Diagnosis present

## 2015-09-09 DIAGNOSIS — R7989 Other specified abnormal findings of blood chemistry: Secondary | ICD-10-CM | POA: Diagnosis present

## 2015-09-09 DIAGNOSIS — Z950 Presence of cardiac pacemaker: Secondary | ICD-10-CM | POA: Diagnosis present

## 2015-09-09 DIAGNOSIS — J9611 Chronic respiratory failure with hypoxia: Secondary | ICD-10-CM | POA: Diagnosis present

## 2015-09-09 HISTORY — DX: Other specified abnormal findings of blood chemistry: R79.89

## 2015-09-09 HISTORY — DX: Chronic combined systolic (congestive) and diastolic (congestive) heart failure: I50.42

## 2015-09-09 LAB — COMPREHENSIVE METABOLIC PANEL
ALT: 35 U/L (ref 17–63)
AST: 23 U/L (ref 15–41)
Albumin: 3.2 g/dL — ABNORMAL LOW (ref 3.5–5.0)
Alkaline Phosphatase: 55 U/L (ref 38–126)
Anion gap: 7 (ref 5–15)
BUN: 15 mg/dL (ref 6–20)
CO2: 30 mmol/L (ref 22–32)
Calcium: 8.6 mg/dL — ABNORMAL LOW (ref 8.9–10.3)
Chloride: 101 mmol/L (ref 101–111)
Creatinine, Ser: 0.71 mg/dL (ref 0.61–1.24)
GFR calc Af Amer: >60 mL/min (ref >60)
GFR calc non Af Amer: >60 mL/min (ref >60)
Glucose, Bld: 75 mg/dL (ref 65–99)
Potassium: 3.5 mmol/L (ref 3.5–5.1)
Sodium: 138 mmol/L (ref 135–145)
Total Bilirubin: 0.9 mg/dL (ref 0.3–1.2)
Total Protein: 5.9 g/dL — ABNORMAL LOW (ref 6.5–8.1)

## 2015-09-09 LAB — CBC WITH DIFFERENTIAL/PLATELET
Basophils Absolute: 0 10*3/uL (ref 0.0–0.1)
Basophils Relative: 0 %
Eosinophils Absolute: 0.1 10*3/uL (ref 0.0–0.7)
Eosinophils Relative: 1 %
HCT: 37.6 % — ABNORMAL LOW (ref 39.0–52.0)
Hemoglobin: 12.1 g/dL — ABNORMAL LOW (ref 13.0–17.0)
Lymphocytes Relative: 10 %
Lymphs Abs: 1.4 10*3/uL (ref 0.7–4.0)
MCH: 32.1 pg (ref 26.0–34.0)
MCHC: 32.2 g/dL (ref 30.0–36.0)
MCV: 99.7 fL (ref 78.0–100.0)
Monocytes Absolute: 1.1 10*3/uL — ABNORMAL HIGH (ref 0.1–1.0)
Monocytes Relative: 8 %
Neutro Abs: 11.7 10*3/uL — ABNORMAL HIGH (ref 1.7–7.7)
Neutrophils Relative %: 81 %
Platelets: 132 10*3/uL — ABNORMAL LOW (ref 150–400)
RBC: 3.77 MIL/uL — ABNORMAL LOW (ref 4.22–5.81)
RDW: 15.2 % (ref 11.5–15.5)
WBC: 14.3 10*3/uL — ABNORMAL HIGH (ref 4.0–10.5)

## 2015-09-09 LAB — ETHANOL: Alcohol, Ethyl (B): <5 mg/dL (ref <5)

## 2015-09-09 LAB — TROPONIN I: Troponin I: <0.03 ng/mL (ref <0.031)

## 2015-09-09 LAB — BRAIN NATRIURETIC PEPTIDE: B Natriuretic Peptide: 124 pg/mL — ABNORMAL HIGH (ref 0.0–100.0)

## 2015-09-09 MED ORDER — FUROSEMIDE 10 MG/ML IJ SOLN
40.0000 mg | INTRAMUSCULAR | Status: AC
  Administered 2015-09-09: 40 mg via INTRAVENOUS
  Filled 2015-09-09: qty 4

## 2015-09-09 MED ORDER — ACETAMINOPHEN 325 MG PO TABS
650.0000 mg | ORAL_TABLET | Freq: Four times a day (QID) | ORAL | Status: DC | PRN
  Administered 2015-09-11 – 2015-09-18 (×2): 650 mg via ORAL
  Filled 2015-09-09 (×2): qty 2

## 2015-09-09 MED ORDER — GUAIFENESIN-DM 100-10 MG/5ML PO SYRP: 5.0000 mL | ORAL_SOLUTION | ORAL | Status: DC | PRN

## 2015-09-09 MED ORDER — IPRATROPIUM BROMIDE 0.02 % IN SOLN
RESPIRATORY_TRACT | Status: AC
  Administered 2015-09-09: 0.5 mg
  Filled 2015-09-09: qty 2.5

## 2015-09-09 MED ORDER — ASPIRIN EC 81 MG PO TBEC
81.0000 mg | DELAYED_RELEASE_TABLET | Freq: Every day | ORAL | Status: DC
  Administered 2015-09-10 – 2015-09-19 (×6): 81 mg via ORAL
  Filled 2015-09-09 (×10): qty 1

## 2015-09-09 MED ORDER — FUROSEMIDE 40 MG PO TABS
40.0000 mg | ORAL_TABLET | Freq: Every day | ORAL | Status: DC
  Administered 2015-09-10 – 2015-09-11 (×2): 40 mg via ORAL
  Filled 2015-09-09 (×2): qty 1

## 2015-09-09 MED ORDER — DOCUSATE SODIUM 100 MG PO CAPS
100.0000 mg | ORAL_CAPSULE | Freq: Every day | ORAL | Status: DC
  Administered 2015-09-11 – 2015-09-19 (×8): 100 mg via ORAL
  Filled 2015-09-09 (×10): qty 1

## 2015-09-09 MED ORDER — ONDANSETRON HCL 4 MG PO TABS
4.0000 mg | ORAL_TABLET | Freq: Four times a day (QID) | ORAL | Status: DC | PRN
Start: 2015-09-09 — End: 2015-09-19

## 2015-09-09 MED ORDER — PREDNISONE 20 MG PO TABS
40.0000 mg | ORAL_TABLET | Freq: Once | ORAL | Status: AC
  Administered 2015-09-09: 40 mg via ORAL
  Filled 2015-09-09: qty 2

## 2015-09-09 MED ORDER — POTASSIUM CHLORIDE CRYS ER 20 MEQ PO TBCR
20.0000 meq | EXTENDED_RELEASE_TABLET | Freq: Every day | ORAL | Status: DC
  Administered 2015-09-10 – 2015-09-19 (×10): 20 meq via ORAL
  Filled 2015-09-09 (×10): qty 1

## 2015-09-09 MED ORDER — ALBUTEROL (5 MG/ML) CONTINUOUS INHALATION SOLN
10.0000 mg/h | INHALATION_SOLUTION | RESPIRATORY_TRACT | Status: DC
  Administered 2015-09-09: 10 mg/h via RESPIRATORY_TRACT
  Filled 2015-09-09: qty 20

## 2015-09-09 MED ORDER — DOXYCYCLINE HYCLATE 100 MG IV SOLR
INTRAVENOUS | Status: AC
  Filled 2015-09-09: qty 200

## 2015-09-09 MED ORDER — ALUM & MAG HYDROXIDE-SIMETH 200-200-20 MG/5ML PO SUSP: 30.0000 mL | Freq: Four times a day (QID) | ORAL | Status: DC | PRN

## 2015-09-09 MED ORDER — BUDESONIDE-FORMOTEROL FUMARATE 160-4.5 MCG/ACT IN AERO
2.0000 | INHALATION_SPRAY | Freq: Two times a day (BID) | RESPIRATORY_TRACT | Status: DC
  Administered 2015-09-09 – 2015-09-19 (×20): 2 via RESPIRATORY_TRACT
  Filled 2015-09-09: qty 6

## 2015-09-09 MED ORDER — ONDANSETRON HCL 4 MG/2ML IJ SOLN: 4.0000 mg | Freq: Three times a day (TID) | INTRAMUSCULAR | Status: DC | PRN

## 2015-09-09 MED ORDER — ACETAMINOPHEN 650 MG RE SUPP: 650.0000 mg | Freq: Four times a day (QID) | RECTAL | Status: DC | PRN

## 2015-09-09 MED ORDER — ALBUTEROL SULFATE (2.5 MG/3ML) 0.083% IN NEBU
2.5000 mg | INHALATION_SOLUTION | Freq: Four times a day (QID) | RESPIRATORY_TRACT | Status: DC
  Administered 2015-09-09 – 2015-09-11 (×7): 2.5 mg via RESPIRATORY_TRACT
  Filled 2015-09-09 (×7): qty 3

## 2015-09-09 MED ORDER — BUDESONIDE-FORMOTEROL FUMARATE 160-4.5 MCG/ACT IN AERO
INHALATION_SPRAY | RESPIRATORY_TRACT | Status: AC
  Filled 2015-09-09: qty 6

## 2015-09-09 MED ORDER — ACETAMINOPHEN 500 MG PO TABS
1000.0000 mg | ORAL_TABLET | Freq: Once | ORAL | Status: AC
  Administered 2015-09-09: 1000 mg via ORAL
  Filled 2015-09-09: qty 2

## 2015-09-09 MED ORDER — METHYLPREDNISOLONE SODIUM SUCC 125 MG IJ SOLR
60.0000 mg | Freq: Two times a day (BID) | INTRAMUSCULAR | Status: DC
  Administered 2015-09-09 – 2015-09-12 (×6): 60 mg via INTRAVENOUS
  Filled 2015-09-09 (×6): qty 2

## 2015-09-09 MED ORDER — TAMSULOSIN HCL 0.4 MG PO CAPS
0.4000 mg | ORAL_CAPSULE | Freq: Every day | ORAL | Status: DC
  Administered 2015-09-09 – 2015-09-18 (×10): 0.4 mg via ORAL
  Filled 2015-09-09 (×10): qty 1

## 2015-09-09 MED ORDER — IPRATROPIUM BROMIDE 0.02 % IN SOLN
0.5000 mg | Freq: Four times a day (QID) | RESPIRATORY_TRACT | Status: DC
  Administered 2015-09-09 – 2015-09-11 (×7): 0.5 mg via RESPIRATORY_TRACT
  Filled 2015-09-09 (×7): qty 2.5

## 2015-09-09 MED ORDER — ALBUTEROL SULFATE (2.5 MG/3ML) 0.083% IN NEBU: 2.5000 mg | INHALATION_SOLUTION | RESPIRATORY_TRACT | Status: DC

## 2015-09-09 MED ORDER — DOXYCYCLINE HYCLATE 100 MG IV SOLR
100.0000 mg | Freq: Two times a day (BID) | INTRAVENOUS | Status: DC
  Administered 2015-09-09 – 2015-09-11 (×4): 100 mg via INTRAVENOUS
  Filled 2015-09-09 (×6): qty 100

## 2015-09-09 MED ORDER — ONDANSETRON HCL 4 MG/2ML IJ SOLN: 4.0000 mg | Freq: Four times a day (QID) | INTRAMUSCULAR | Status: DC | PRN

## 2015-09-09 MED ORDER — ALBUTEROL SULFATE (2.5 MG/3ML) 0.083% IN NEBU
2.5000 mg | INHALATION_SOLUTION | RESPIRATORY_TRACT | Status: DC | PRN
  Administered 2015-09-09 – 2015-09-17 (×4): 2.5 mg via RESPIRATORY_TRACT
  Filled 2015-09-09 (×5): qty 3

## 2015-09-09 NOTE — ED Notes (Signed)
Pt was given a meal.

## 2015-09-09 NOTE — ED Notes (Signed)
[facility] given to [facility], all questions answered.

## 2015-09-09 NOTE — Progress Notes (Signed)
Mr. Karma GreaserBoswell arrived to unit 300 rm # [phone].  Dr. Adrian BlackwaterStinson notified.

## 2015-09-09 NOTE — H&P (Signed)
History and Physical  [name] [name] ZOX:[id number] DOB: 03/27/1946 DOA: 09/09/2015  Referring physician: Dr Hyacinth MeekerMiller, ED physician PCP: Daryel NovemberMILLER,GARY, MD   Chief Complaint: Shortness of breath  HPI: [name] Booksndrew H Kulpa is a 69 y.o. male  With a history of severe aortic valve stenosis status post porcine valve replacement in 2015, COPD, thoracic AAA repaired in 2015, BPH, bipolar disorder not on controller medications, CHF, tobacco abuse. Patient was hospitalized here from 10/22 until 10/24 for acute respiratory failure with COPD exacerbation.  After being discharged, [facility] was [facility] problem. He was discharged 6 days ago with a prednisone taper. His breathing was fine until approximately 2 days ago when his breathing started worsening again.  His breathing worsened over the past couple of days where activity would worsen his breathing and rest improved. Had increased cough with some white sputum production. He was at a friend's store earlier today when he acutely became short of breath and was brought to emergency department by EMS for evaluation.   Review of Systems:   Pt complains of pitting edema in his feet bilaterally, wheezing, orthopnea.  Pt denies any fevers, chills, nausea, vomiting, diarrhea, constipation, abdominal pain, palpitations, headache, vision changes, lightheadedness, dizziness, diarrhea, constipation, melena, rectal bleeding.  Review of systems are otherwise negative  Past Medical History  Diagnosis Date  . Severe aortic valve stenosis     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  . COPD (chronic obstructive pulmonary disease) (HCC)   . Thoracic ascending aortic aneurysm (HCC)     [facility]  . COPD (chronic obstructive pulmonary disease) (HCC)   . BPH (benign prostatic hyperplasia)   . Pacemaker     [facility]  . SSS (sick sinus syndrome) (HCC)     s/p PPM   . CAD (coronary artery disease)      ?MI in 2007  . Pneumothorax     traumatic 2007  . Anxiety and depression   . Psychosis   . Bipolar disorder (HCC)   . CHF (congestive heart failure) (HCC)   . Neuropathic pain   . Homelessness   . Tobacco abuse    Past Surgical History  Procedure Laterality Date  . Pacemaker placement  2007    battery replaced 07/04/2014  . Hiatal hernia repair    . Knee surgery      L5-6  . Lumbar fusion    . Aortic valve replacement      porcine valve  . Cardiac surgery      ascending thoracic aneurysm repair 06/2014   Social History:  reports that he has been smoking Cigarettes.  He has been smoking about 1.00 pack per day. He does not have any smokeless tobacco history on file. He reports that he drinks alcohol. He reports that he uses illicit drugs (Marijuana).   Allergies  Allergen Reactions  . Tiotropium Bromide Monohydrate Nausea And Vomiting, Other (See Comments) and Nausea Only    Tremors Reaction:  Tremors  . Morphine And Related Other (See Comments)    Reaction:  Hallucinations     Family History  Problem Relation Age of Onset  . CAD Father   . Diabetes Father   . High blood pressure Brother   . Cancer Brother      Prior to Admission medications   Medication Sig Start Date End Date Taking? Authorizing Provider  albuterol (PROVENTIL HFA;VENTOLIN HFA) 108 (90 BASE) MCG/ACT inhaler Inhale 2 puffs into  the lungs every 6 (six) hours as needed for wheezing or shortness of breath. 08/22/15  Yes Henderson Cloud, MD  aspirin EC 81 MG tablet Take 81 mg by mouth daily.   Yes Historical Provider, MD  docusate sodium (COLACE) 100 MG capsule Take 100 mg by mouth daily.   Yes Historical Provider, MD  folic acid (FOLVITE) 1 MG tablet Take 1 tablet (1 mg total) by mouth daily. 10/04/14  Yes Richarda Overlie, MD  furosemide (LASIX) 40 MG tablet Take 1 tablet (40 mg total) by mouth daily. 01/05/15  Yes Bethann Berkshire, MD  ipratropium-albuterol (DUONEB) 0.5-2.5 (3) MG/3ML SOLN Inhale 3  mLs into the lungs every 4 (four) hours as needed (for shortness of breath).  09/03/15  Yes Historical Provider, MD  potassium chloride SA (K-DUR,KLOR-CON) 20 MEQ tablet Take 20 mEq by mouth daily.   Yes Historical Provider, MD  tamsulosin (FLOMAX) 0.4 MG CAPS capsule Take 0.4 mg by mouth at bedtime.   Yes Historical Provider, MD  vardenafil (LEVITRA) 10 MG tablet Take 10 mg by mouth as needed for erectile dysfunction.   Yes Historical Provider, MD  azithromycin (ZITHROMAX) 250 MG tablet TAKE TWO TABLETS BY MOUTH ON DAY 1, THEN TAKE ONE TABLET ON DAYS 2 THROUGH 5 AS DIRECTED PER PACKAGE INSTRUCTIONS 09/04/15   Historical Provider, MD  predniSONE (DELTASONE) 20 MG tablet Take 40 mg by mouth daily. 5 day course starting on 09/04/15 09/04/15   Historical Provider, MD  SYMBICORT 160-4.5 MCG/ACT inhaler INHALE TWO PUFFS BY MOUTH TWICE DAILY 09/04/15   Historical Provider, MD    Physical Exam: BP 124/62 mmHg  Pulse 93  Temp(Src) 98.3 F (36.8 C) (Oral)  Resp 22  Ht 5' 10.5" (1.791 m)  Wt 76.204 kg (168 lb)  BMI 23.76 kg/m2  SpO2 98%  General: Elderly Caucasian male. Awake and alert and oriented x3. Patient continues to have some shortness of breath, particularly in speaking in long sentences. Some pursed lip breathing Eyes: Pupils equal, round, reactive to light. Extraocular muscles are intact. Sclerae anicteric and noninjected.  ENT: Moist mucosal membranes. No mucosal lesions.  Neck: Neck supple without lymphadenopathy. No carotid bruits. No masses palpated.  Cardiovascular: Regular rate with normal S1-S2 sounds. No murmurs, rubs, gallops auscultated. No JVD. 3+ pitting edema in his feet bilaterally Respiratory: Diminished breath sounds throughout. Wheezing particularly on the right side no rales..  Abdomen: Soft, nontender, nondistended. Active bowel sounds. No masses or hepatosplenomegaly  Skin: Dry, warm to touch. 2+ dorsalis pedis and radial pulses. Musculoskeletal: No calf or leg pain. All  major joints not erythematous nontender.  Psychiatric: Intact judgment and insight.  Neurologic: No focal neurological deficits. Cranial nerves II through XII are grossly intact.           Labs on Admission:  Basic Metabolic Panel:  Recent Labs Lab 09/09/15 1429  NA 138  K 3.5  CL 101  CO2 30  GLUCOSE 75  BUN 15  CREATININE 0.71  CALCIUM 8.6*   Liver Function Tests:  Recent Labs Lab 09/09/15 1429  AST 23  ALT 35  ALKPHOS 55  BILITOT 0.9  PROT 5.9*  ALBUMIN 3.2*   No results for input(s): LIPASE, AMYLASE in the last 168 hours. No results for input(s): AMMONIA in the last 168 hours. CBC:  Recent Labs Lab 09/09/15 1429  WBC 14.3*  NEUTROABS 11.7*  HGB 12.1*  HCT 37.6*  MCV 99.7  PLT 132*   Cardiac Enzymes:  Recent Labs Lab 09/09/15  1429  TROPONINI <0.03    BNP (last 3 results)  Recent Labs  03/06/15 1657 08/19/15 1550 09/09/15 1429  BNP 92.0 100.0 124.0*    ProBNP (last 3 results)  Recent Labs  09/24/14 0704  PROBNP 798.5*    CBG: No results for input(s): GLUCAP in the last 168 hours.  Radiological Exams on Admission: Dg Chest 2 View  09/09/2015  CLINICAL DATA:  Shortness of breath. Heart surgery in 2015. Pacemaker placed in 2007. EXAM: CHEST  2 VIEW COMPARISON:  Chest x-ray dated 08/22/2015. FINDINGS: Heart size is within normal limits and unchanged. Overall cardiomediastinal silhouette is stable in size and configuration. Median sternotomy wires appear intact and stable in alignment. Valve replacement hardware appears stable in position. Left chest wall pacemaker/ AICD appears stable in position. Lungs are hyperexpanded consistent with COPD. Lungs are clear. No evidence of pneumonia. No pleural effusions seen. No pneumothorax. Mild degenerative change again noted within the thoracic spine. No acute osseous abnormality. Surgical clips present over the right upper chest wall. IMPRESSION: 1. Hyperexpanded lungs consistent with COPD. 2.  Surgical changes as described above. 3. Stable chest x-ray. No evidence of acute cardiopulmonary abnormality. Lungs are clear. Electronically Signed   By: Bary Richard M.D.   On: 09/09/2015 14:57    EKG: Independently reviewed. Sinus rhythm with a rate of 81. Left bundle branch block. Prolonged QTC. ST elevation in the inferior leads, but unchanged from previous. Negative for STEMI  Assessment/Plan Present on Admission:  . COPD exacerbation (HCC) . Acute respiratory failure with hypoxia (HCC) . Bipolar 1 disorder (HCC) . Chronic combined systolic and diastolic CHF (congestive heart failure) (HCC)  This patient was discussed with the ED physician, including pertinent vitals, physical exam findings, labs, and imaging.  We also discussed care given by the ED provider.  #1 acute respiratory failure with hypoxia  Admit to telemetry  Oxygen via nasal cannula  Continue IV steroids  Start doxycycline given his increase in cough  Nebulizer treatments  Consider consulting pulmonology given this is his third admission in 3 weeks #2 COPD with exacerbation  As outlined above #3 bipolar disorder  Currently not managed #4 chronic combined systolic and diastolic heart failure  Continue Lasix  BNP 124 and x-ray without evidence of fluid overloaded  DVT prophylaxis: Patient declined  Consultants:   Code Status: Full code  Family Communication: None   Disposition Plan: Admission   Levie Heritage, DO [facility] Pager [phone]

## 2015-09-09 NOTE — ED Notes (Signed)
[facility] four hours c/o of SOB.

## 2015-09-09 NOTE — ED Provider Notes (Signed)
CSN: [id number]     Arrival date & time 09/09/15  1352 History   First MD Initiated Contact with Patient 09/09/15 1405     Chief Complaint  Patient presents with  . Shortness of Breath     (Consider location/radiation/quality/duration/timing/severity/associated sxs/prior Treatment) HPI  The pt is a 69 y/o male - he has multiple medical problems including Aortic valve replacement for AS, COPD and CHF - he has a pacemaker - he was recently admitted to [facility] for COPD and spent 9 days there, he then has been doing well but over the last 6 days he has developed increased SOB and swelling of the legs - this is constant, it is not made better with his inhaled meds at home and has had persistent symptoms.  He has no fevers, no vomiting.  He was at the liquor store visiting with his friend who is the manager there as he became more dyspneic.  He states that "they had to pick me up off the ground".  He denies CP, fevers or other c/o.  Past Medical History  Diagnosis Date  . Severe aortic valve stenosis     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 at [facility]  . COPD (chronic obstructive pulmonary disease) (HCC)   . Thoracic ascending aortic aneurysm (HCC)     repaired 06/2014 at [facility]  . COPD (chronic obstructive pulmonary disease) (HCC)   . BPH (benign prostatic hyperplasia)   . Pacemaker     battery replaced 06/2014 at [facility]  . SSS (sick sinus syndrome) (HCC)     s/p PPM   . CAD (coronary artery disease)     ?MI in 2007  . Pneumothorax     traumatic 2007  . Anxiety and depression   . Psychosis   . Bipolar disorder (HCC)   . CHF (congestive heart failure) (HCC)   . Neuropathic pain   . Homelessness   . Tobacco abuse    Past Surgical History  Procedure Laterality Date  . Pacemaker placement  2007    battery replaced 07/04/2014  . Hiatal hernia repair    . Knee surgery      L5-6  . Lumbar fusion    . Aortic valve replacement      porcine valve  . Cardiac surgery       ascending thoracic aneurysm repair 06/2014   Family History  Problem Relation Age of Onset  . CAD Father   . Diabetes Father   . High blood pressure Brother   . Cancer Brother    Social History  Substance Use Topics  . Smoking status: Current Some Day Smoker -- 1.00 packs/day    Types: Cigarettes  . Smokeless tobacco: None  . Alcohol Use: Yes     Comment: occ    Review of Systems  All other systems reviewed and are negative.     Allergies  Tiotropium bromide monohydrate and Morphine and related  Home Medications   Prior to Admission medications   Medication Sig Start Date End Date Taking? Authorizing Provider  albuterol (PROVENTIL HFA;VENTOLIN HFA) 108 (90 BASE) MCG/ACT inhaler Inhale 2 puffs into the lungs every 6 (six) hours as needed for wheezing or shortness of breath. 08/22/15  Yes Henderson Cloud, MD  aspirin EC 81 MG tablet Take 81 mg by mouth daily.   Yes Historical Provider, MD  docusate sodium (COLACE) 100 MG capsule Take 100 mg by mouth daily.   Yes Historical Provider, MD  folic acid (FOLVITE) 1  MG tablet Take 1 tablet (1 mg total) by mouth daily. 10/04/14  Yes Richarda OverlieNayana Abrol, MD  furosemide (LASIX) 40 MG tablet Take 1 tablet (40 mg total) by mouth daily. 01/05/15  Yes Bethann BerkshireJoseph Zammit, MD  ipratropium-albuterol (DUONEB) 0.5-2.5 (3) MG/3ML SOLN Inhale 3 mLs into the lungs every 4 (four) hours as needed (for shortness of breath).  09/03/15  Yes Historical Provider, MD  potassium chloride SA (K-DUR,KLOR-CON) 20 MEQ tablet Take 20 mEq by mouth daily.   Yes Historical Provider, MD  tamsulosin (FLOMAX) 0.4 MG CAPS capsule Take 0.4 mg by mouth at bedtime.   Yes Historical Provider, MD  vardenafil (LEVITRA) 10 MG tablet Take 10 mg by mouth as needed for erectile dysfunction.   Yes Historical Provider, MD  azithromycin (ZITHROMAX) 250 MG tablet TAKE TWO TABLETS BY MOUTH ON DAY 1, THEN TAKE ONE TABLET ON DAYS 2 THROUGH 5 AS DIRECTED PER PACKAGE INSTRUCTIONS 09/04/15    Historical Provider, MD  budesonide-formoterol (SYMBICORT) 80-4.5 MCG/ACT inhaler Inhale 2 puffs into the lungs 2 (two) times daily. Patient not taking: Reported on 09/09/2015 08/22/15   Henderson CloudEstela Y Hernandez Acosta, MD  levofloxacin (LEVAQUIN) 750 MG tablet Take 1 tablet (750 mg total) by mouth daily. Patient not taking: Reported on 09/09/2015 08/22/15   Henderson CloudEstela Y Hernandez Acosta, MD  predniSONE (DELTASONE) 10 MG tablet Take 1 tablet (10 mg total) by mouth daily with breakfast. Take 6 tablets today and then decrease by 1 tablet daily until none are left. Patient not taking: Reported on 09/09/2015 08/22/15   Henderson CloudEstela Y Hernandez Acosta, MD  predniSONE (DELTASONE) 20 MG tablet Take 40 mg by mouth daily. 5 day course starting on 09/04/15 09/04/15   Historical Provider, MD  SYMBICORT 160-4.5 MCG/ACT inhaler INHALE TWO PUFFS BY MOUTH TWICE DAILY 09/04/15   Historical Provider, MD   BP 124/62 mmHg  Pulse 103  Temp(Src) 98.3 F (36.8 C) (Oral)  Resp 22  Ht 5' 10.5" (1.791 m)  Wt 168 lb (76.204 kg)  BMI 23.76 kg/m2  SpO2 93% Physical Exam  Constitutional: He appears well-developed and well-nourished. He appears distressed.  HENT:  Head: Normocephalic and atraumatic.  Mouth/Throat: Oropharynx is clear and moist. No oropharyngeal exudate.  Eyes: Conjunctivae and EOM are normal. Pupils are equal, round, and reactive to light. Right eye exhibits no discharge. Left eye exhibits no discharge. No scleral icterus.  Neck: Normal range of motion. Neck supple. No JVD present. No thyromegaly present.  Cardiovascular: Normal rate, regular rhythm, normal heart sounds and intact distal pulses.  Exam reveals no gallop and no friction rub.   No murmur heard. Pulmonary/Chest: He is in respiratory distress. He has wheezes. He has no rales.  Wheezing diffusely in all lung fields, increased work of breathing  Abdominal: Soft. Bowel sounds are normal. He exhibits no distension and no mass. There is no tenderness.   Musculoskeletal: Normal range of motion. He exhibits edema (bilateral symmetrical pitting edema below the knees, 2+). He exhibits no tenderness.  Lymphadenopathy:    He has no cervical adenopathy.  Neurological: He is alert. Coordination normal.  Skin: Skin is warm and dry. No rash noted. No erythema.  Psychiatric: He has a normal mood and affect. His behavior is normal.  Nursing note and vitals reviewed.   ED Course  Procedures (including critical care time) Labs Review Labs Reviewed  COMPREHENSIVE METABOLIC PANEL - Abnormal; Notable for the following:    Calcium 8.6 (*)    Total Protein 5.9 (*)    Albumin  3.2 (*)    All other components within normal limits  CBC WITH DIFFERENTIAL/PLATELET - Abnormal; Notable for the following:    WBC 14.3 (*)    RBC 3.77 (*)    Hemoglobin 12.1 (*)    HCT 37.6 (*)    Platelets 132 (*)    Neutro Abs 11.7 (*)    Monocytes Absolute 1.1 (*)    All other components within normal limits  BRAIN NATRIURETIC PEPTIDE - Abnormal; Notable for the following:    B Natriuretic Peptide 124.0 (*)    All other components within normal limits  ETHANOL  TROPONIN I    Imaging Review Dg Chest 2 View  09/09/2015  CLINICAL DATA:  Shortness of breath. Heart surgery in 2015. Pacemaker placed in 2007. EXAM: CHEST  2 VIEW COMPARISON:  Chest x-ray dated 08/22/2015. FINDINGS: Heart size is within normal limits and unchanged. Overall cardiomediastinal silhouette is stable in size and configuration. Median sternotomy wires appear intact and stable in alignment. Valve replacement hardware appears stable in position. Left chest wall pacemaker/ AICD appears stable in position. Lungs are hyperexpanded consistent with COPD. Lungs are clear. No evidence of pneumonia. No pleural effusions seen. No pneumothorax. Mild degenerative change again noted within the thoracic spine. No acute osseous abnormality. Surgical clips present over the right upper chest wall. IMPRESSION: 1.  Hyperexpanded lungs consistent with COPD. 2. Surgical changes as described above. 3. Stable chest x-ray. No evidence of acute cardiopulmonary abnormality. Lungs are clear. Electronically Signed   By: Bary Richard M.D.   On: 09/09/2015 14:57   I have personally reviewed and evaluated these images and lab results as part of my medical decision-making.   EKG Interpretation   Date/Time:  Saturday September 09 2015 14:27:49 EST Ventricular Rate:  81 PR Interval:  150 QRS Duration: 167 QT Interval:  442 QTC Calculation: 513 R Axis:   -85 Text Interpretation:  Sinus rhythm Left bundle branch block Inferior  infarct, acute (RCA) Probable RV involvement, suggest recording right  precordial leads since last tracing no significant change Confirmed by  Santos Sollenberger  MD, Mariateresa Batra ([id number]) on 09/09/2015 2:30:51 PM      MDM   Final diagnoses:  COPD exacerbation (HCC)    The patient has vital signs which are not abnormal, he is requiring supplemental oxygen and he usually does not use oxygen at home. He does not have this at home - he has increased fluid which may indicate CHF as well - he will get continuous nebs as he is in respiratory distress, BNP, ECG, CXR and labs.    Labs show leukocytosis, electrolytes are unremarkable, BNP shows no signs of acute CHF exacerbation, chest x-ray reveals no pulmonary edema, the patient likely has severe COPD. He has been unable to ambulate off of oxygen without hypoxia and significant weakness, I have discussed his care with the hospitalist Dr. Adrian Blackwater who will admit the patient to the hospital. Holding orders requested  Medications  albuterol (PROVENTIL,VENTOLIN) solution continuous neb (10 mg/hr Nebulization New Bag/Given 09/09/15 1447)  furosemide (LASIX) injection 40 mg (40 mg Intravenous Given 09/09/15 1448)  predniSONE (DELTASONE) tablet 40 mg (40 mg Oral Given 09/09/15 1448)  acetaminophen (TYLENOL) tablet 1,000 mg (1,000 mg Oral Given 09/09/15 1637)        Eber Hong, MD 09/09/15 1744

## 2015-09-09 NOTE — ED Notes (Signed)
Pt was ambulated on room air sats dropped down to 92%. Pt cound only walk a few feet and said he had no energy, a bad head ache, and was stumbling.

## 2015-09-10 ENCOUNTER — Encounter (HOSPITAL_COMMUNITY): Payer: Self-pay | Admitting: Internal Medicine

## 2015-09-10 DIAGNOSIS — Z950 Presence of cardiac pacemaker: Secondary | ICD-10-CM | POA: Diagnosis not present

## 2015-09-10 DIAGNOSIS — R946 Abnormal results of thyroid function studies: Secondary | ICD-10-CM | POA: Diagnosis not present

## 2015-09-10 DIAGNOSIS — F17210 Nicotine dependence, cigarettes, uncomplicated: Secondary | ICD-10-CM | POA: Diagnosis present

## 2015-09-10 DIAGNOSIS — J9602 Acute respiratory failure with hypercapnia: Secondary | ICD-10-CM | POA: Diagnosis present

## 2015-09-10 DIAGNOSIS — R739 Hyperglycemia, unspecified: Secondary | ICD-10-CM

## 2015-09-10 DIAGNOSIS — Z9119 Patient's noncompliance with other medical treatment and regimen: Secondary | ICD-10-CM | POA: Diagnosis not present

## 2015-09-10 DIAGNOSIS — Z954 Presence of other heart-valve replacement: Secondary | ICD-10-CM

## 2015-09-10 DIAGNOSIS — D696 Thrombocytopenia, unspecified: Secondary | ICD-10-CM | POA: Diagnosis present

## 2015-09-10 DIAGNOSIS — F319 Bipolar disorder, unspecified: Secondary | ICD-10-CM | POA: Diagnosis present

## 2015-09-10 DIAGNOSIS — J441 Chronic obstructive pulmonary disease with (acute) exacerbation: Secondary | ICD-10-CM | POA: Diagnosis present

## 2015-09-10 DIAGNOSIS — I5042 Chronic combined systolic (congestive) and diastolic (congestive) heart failure: Secondary | ICD-10-CM | POA: Diagnosis present

## 2015-09-10 DIAGNOSIS — Z8249 Family history of ischemic heart disease and other diseases of the circulatory system: Secondary | ICD-10-CM | POA: Diagnosis not present

## 2015-09-10 DIAGNOSIS — I350 Nonrheumatic aortic (valve) stenosis: Secondary | ICD-10-CM | POA: Diagnosis present

## 2015-09-10 DIAGNOSIS — I2510 Atherosclerotic heart disease of native coronary artery without angina pectoris: Secondary | ICD-10-CM | POA: Diagnosis present

## 2015-09-10 DIAGNOSIS — Z833 Family history of diabetes mellitus: Secondary | ICD-10-CM | POA: Diagnosis not present

## 2015-09-10 DIAGNOSIS — Z720 Tobacco use: Secondary | ICD-10-CM | POA: Diagnosis not present

## 2015-09-10 DIAGNOSIS — J9601 Acute respiratory failure with hypoxia: Secondary | ICD-10-CM | POA: Diagnosis present

## 2015-09-10 DIAGNOSIS — Z952 Presence of prosthetic heart valve: Secondary | ICD-10-CM | POA: Diagnosis not present

## 2015-09-10 DIAGNOSIS — T380X5A Adverse effect of glucocorticoids and synthetic analogues, initial encounter: Secondary | ICD-10-CM | POA: Diagnosis present

## 2015-09-10 DIAGNOSIS — I5022 Chronic systolic (congestive) heart failure: Secondary | ICD-10-CM | POA: Diagnosis not present

## 2015-09-10 DIAGNOSIS — R0602 Shortness of breath: Secondary | ICD-10-CM | POA: Diagnosis present

## 2015-09-10 LAB — CBC
HCT: 38.6 % — ABNORMAL LOW (ref 39.0–52.0)
Hemoglobin: 12.3 g/dL — ABNORMAL LOW (ref 13.0–17.0)
MCH: 32.1 pg (ref 26.0–34.0)
MCHC: 31.9 g/dL (ref 30.0–36.0)
MCV: 100.8 fL — ABNORMAL HIGH (ref 78.0–100.0)
Platelets: 143 10*3/uL — ABNORMAL LOW (ref 150–400)
RBC: 3.83 MIL/uL — ABNORMAL LOW (ref 4.22–5.81)
RDW: 15 % (ref 11.5–15.5)
WBC: 10.4 10*3/uL (ref 4.0–10.5)

## 2015-09-10 LAB — BASIC METABOLIC PANEL
Anion gap: 8 (ref 5–15)
BUN: 20 mg/dL (ref 6–20)
CO2: 32 mmol/L (ref 22–32)
Calcium: 8.5 mg/dL — ABNORMAL LOW (ref 8.9–10.3)
Chloride: 98 mmol/L — ABNORMAL LOW (ref 101–111)
Creatinine, Ser: 0.78 mg/dL (ref 0.61–1.24)
GFR calc Af Amer: >60 mL/min (ref >60)
GFR calc non Af Amer: >60 mL/min (ref >60)
Glucose, Bld: 225 mg/dL — ABNORMAL HIGH (ref 65–99)
Potassium: 4.3 mmol/L (ref 3.5–5.1)
Sodium: 138 mmol/L (ref 135–145)

## 2015-09-10 LAB — GLUCOSE, CAPILLARY
Glucose-Capillary: 177 mg/dL — ABNORMAL HIGH (ref 65–99)
Glucose-Capillary: 153 mg/dL — ABNORMAL HIGH (ref 65–99)
Glucose-Capillary: 224 mg/dL — ABNORMAL HIGH (ref 65–99)
Glucose-Capillary: 150 mg/dL — ABNORMAL HIGH (ref 65–99)

## 2015-09-10 LAB — VITAMIN B12: Vitamin B-12: 300 pg/mL (ref 180–914)

## 2015-09-10 MED ORDER — INSULIN ASPART 100 UNIT/ML ~~LOC~~ SOLN
0.0000 [IU] | Freq: Three times a day (TID) | SUBCUTANEOUS | Status: DC
  Administered 2015-09-10 (×2): 4 [IU] via SUBCUTANEOUS
  Administered 2015-09-10 – 2015-09-11 (×2): 7 [IU] via SUBCUTANEOUS
  Administered 2015-09-11 (×2): 3 [IU] via SUBCUTANEOUS
  Administered 2015-09-12: 11 [IU] via SUBCUTANEOUS
  Administered 2015-09-13: 3 [IU] via SUBCUTANEOUS
  Administered 2015-09-13: 7 [IU] via SUBCUTANEOUS
  Administered 2015-09-14: 15 [IU] via SUBCUTANEOUS
  Administered 2015-09-14: 3 [IU] via SUBCUTANEOUS
  Administered 2015-09-15: 7 [IU] via SUBCUTANEOUS

## 2015-09-10 MED ORDER — INSULIN ASPART 100 UNIT/ML ~~LOC~~ SOLN
0.0000 [IU] | Freq: Every day | SUBCUTANEOUS | Status: DC
  Administered 2015-09-12: 2 [IU] via SUBCUTANEOUS
  Administered 2015-09-16 – 2015-09-17 (×2): 3 [IU] via SUBCUTANEOUS

## 2015-09-10 MED ORDER — FUROSEMIDE 10 MG/ML IJ SOLN
40.0000 mg | Freq: Every day | INTRAMUSCULAR | Status: DC
  Administered 2015-09-10 – 2015-09-12 (×2): 40 mg via INTRAVENOUS
  Filled 2015-09-10 (×2): qty 4

## 2015-09-10 NOTE — Plan of Care (Signed)
Pt continues to be demanding regarding basic needs and immediate responses.  RN and aide have responded as possible and also requested pt to contact us directly via phone.  Pt insists he needs continued breathing treatments, but RN educated that next treatment is not due till 0200.  Pt wanted treatment early and stated he knows we can give early as needed.  RN informed resp of pt request who indicated he's be able to reach pt about 0200.  Pt currently in no distress and vitals are stable.  Pt informed.

## 2015-09-10 NOTE — Progress Notes (Signed)
[facility] PROGRESS NOTE  [name] [name] WUJ:[id number] DOB: [birth year] DOA: 09/09/2015 PCP: Daryel November, MD    Code Status: Full code Family Communication: Discussed with patient; family not available Disposition Plan: Discharge when clinically appropriate, possibly in the next 48 hours.   Consultants:  None  Procedures:  None  Antibiotics:  Doxycycline 11/12>>  HPI/Subjective: Patient says that he is breathing no worse or no better. He denies chest pain. He reports swelling in his legs most of the time, but a little more over the past couple days. He reports not missing taking Lasix.  Objective: Filed Vitals:   09/10/15 0915  BP:   Pulse: 85  Temp:   Resp: 18   temperature 98.3. Pulse 85. Rest her rate 18. Blood pressure 124/62. Oxygen saturation 98% on room air.  Intake/Output Summary (Last 24 hours) at 09/10/15 1242 Last data filed at 09/10/15 0900  Gross per 24 hour  Intake    490 ml  Output      0 ml  Net    490 ml   Filed Weights   09/09/15 1406  Weight: 76.204 kg (168 lb)    Exam:   General:  69 year old Caucasian man in no acute distress.  Cardiovascular: S1, S2, with a soft systolic murmur and S2 click.  Respiratory: Mostly coarse breath sounds with occasional wheezes. Breathing nonlabored.  Abdomen: Positive bowel sounds, soft, nontender, nondistended.  Musculoskeletal/extremities: No acute hot joints. 2+ bilateral lower extremity edema, pitting.  Neurologic: His speech is clear. He is alert and oriented 2. Cranial nerves II through XII are grossly intact.   Data Reviewed: Basic Metabolic Panel:  Recent Labs Lab 09/09/15 1429 09/10/15 0623  NA 138 138  K 3.5 4.3  CL 101 98*  CO2 30 32  GLUCOSE 75 225*  BUN 15 20  CREATININE 0.71 0.78  CALCIUM 8.6* 8.5*   Liver Function Tests:  Recent Labs Lab 09/09/15 1429  AST 23  ALT 35  ALKPHOS 55  BILITOT 0.9  PROT 5.9*  ALBUMIN 3.2*   No results for input(s): LIPASE,  AMYLASE in the last 168 hours. No results for input(s): AMMONIA in the last 168 hours. CBC:  Recent Labs Lab 09/09/15 1429 09/10/15 0623  WBC 14.3* 10.4  NEUTROABS 11.7*  --   HGB 12.1* 12.3*  HCT 37.6* 38.6*  MCV 99.7 100.8*  PLT 132* 143*   Cardiac Enzymes:  Recent Labs Lab 09/09/15 1429  TROPONINI <0.03   BNP (last 3 results)  Recent Labs  03/06/15 1657 08/19/15 1550 09/09/15 1429  BNP 92.0 100.0 124.0*    ProBNP (last 3 results)  Recent Labs  09/24/14 0704  PROBNP 798.5*    CBG:  Recent Labs Lab 09/10/15 1007 09/10/15 1138  GLUCAP 177* 153*    No results found for this or any previous visit (from the past 240 hour(s)).   Studies: Dg Chest 2 View  09/09/2015  CLINICAL DATA:  Shortness of breath. Heart surgery in 2015. Pacemaker placed in 2007. EXAM: CHEST  2 VIEW COMPARISON:  Chest x-ray dated 08/22/2015. FINDINGS: Heart size is within normal limits and unchanged. Overall cardiomediastinal silhouette is stable in size and configuration. Median sternotomy wires appear intact and stable in alignment. Valve replacement hardware appears stable in position. Left chest wall pacemaker/ AICD appears stable in position. Lungs are hyperexpanded consistent with COPD. Lungs are clear. No evidence of pneumonia. No pleural effusions seen. No pneumothorax. Mild degenerative change again noted within the thoracic spine. No acute osseous  abnormality. Surgical clips present over the right upper chest wall. IMPRESSION: 1. Hyperexpanded lungs consistent with COPD. 2. Surgical changes as described above. 3. Stable chest x-ray. No evidence of acute cardiopulmonary abnormality. Lungs are clear. Electronically Signed   By: Bary Richard M.D.   On: 09/09/2015 14:57    Scheduled Meds: . albuterol  2.5 mg Nebulization Q6H  . aspirin EC  81 mg Oral Daily  . budesonide-formoterol  2 puff Inhalation BID  . docusate sodium  100 mg Oral Daily  . doxycycline (VIBRAMYCIN) IV  100 mg  Intravenous Q12H  . furosemide  40 mg Intravenous Daily  . furosemide  40 mg Oral Daily  . insulin aspart  0-20 Units Subcutaneous TID WC  . insulin aspart  0-5 Units Subcutaneous QHS  . ipratropium  0.5 mg Nebulization Q6H  . methylPREDNISolone (SOLU-MEDROL) injection  60 mg Intravenous Q12H  . potassium chloride SA  20 mEq Oral Daily  . tamsulosin  0.4 mg Oral QHS   Continuous Infusions:   Principal Problem:   COPD exacerbation (HCC) Active Problems:   Chronic systolic CHF (congestive heart failure) (HCC)   Acute respiratory failure with hypoxia (HCC)   H/O aortic valve replacement   History of permanent cardiac pacemaker placement   Bipolar 1 disorder (HCC)   Tobacco abuse   Hyperglycemia, drug-induced   Thrombocytopenia (HCC)   1. COPD with exacerbation causing acute respiratory failure with hypoxia. Patient was discharged from [facility] approximately 4 weeks ago secondary to acute respiratory failure with hypercapnia secondary to COPD exacerbation. Reportedly, he was discharged from [facility] approximately 6 days ago on a prednisone taper-he stated that he was hospitalized there for 9 days. Patient presents to the hospital frequently, in part, because of his transient lifestyle and noncompliance. He does not require chronic home oxygen. -On admission, it was reported that he was hypoxic. His proBNP was only 124. His troponin I was negative. His chest x-ray revealed hyperinflated lungs consistent with COPD, otherwise no acute disease. -Patient was started on treatment with doxycycline, IV Solu-Medrol, and albuterol/Atrovent nebulizers. Symbicort was restarted. Oxygen was applied and titrated to keep his oxygen saturations greater than 90%. -He has improved some.  Tobacco abuse. The patient was advised to stop smoking. Will offer nicotine patch as needed.  Chronic systolic congestive heart failure, with mild acute decompensation. Patient has a history of porcine aortic valve  replacement, thoracic aortic aneurysm repair, and permanent pacemaker. His echo 09/2014 revealed an EF of 40-45%. He is treated chronically with Lasix. -He reports not taking an ACE inhibitor or beta blocker, for some unknown reason. -Patient has a history of noncompliance, so will treat his mild decompensated heart failure with Lasix which will be changed to IV given some mild to moderate peripheral edema. His BNP was only 125 and there was no pulmonary edema on his chest x-ray.  Steroid-induced hyperglycemia. The patient's venous glucose was within normal limits on admission, but has increased over 200 following IV steroids. -Sliding-scale NovoLog was started. We'll continue to monitor.  Thrombocytopenia. Patient's platelet count was 132 on admission. In review of his recent chart, there has been no recent history of thrombocytopenia. -Further evaluation, we'll check a vitamin B12 level and TSH/free T4. -We'll continue antiplatelet therapy for now with close monitoring.  Bipolar disorder. Patient is not treated with any type of pharmacological medication, likely due to noncompliance. He has a history of becoming easily angered, belligerent behavior, and leaving AMA. -We'll provide him with supportive treatment.  Time spent: 35 minutes    [facility]ansas Heart HospitalFISHER,Huck Ashworth  [facility] Pager [phone]. If 7PM-7AM, please contact night-coverage at [email or web address], password [email or web address] 09/10/2015, 12:42 PM

## 2015-09-10 NOTE — Plan of Care (Signed)
Pt once again jumped out of bed (knowing the bed alarm was on and dis-regarding it).  Pt insisted to close bathroom does completely, so he " could not be watched." Pt came out of restroom and went to sink to wash hands. Pt was falling asleep at sink while standing. " Do you get a kick out of staying in here and treating me like a damn invalid?" Pt educated, but still was agitated and hostile.  While continuing to curse, he stated  That he was having extreme diff breathing and needed his nebs treatments now.  "Get on that "bleep" computer and tell them to "bleep" hurry up!"  RN assesment revealed no immediate distress and  Explained that the next scheduled treatment was not due till 0800. Pt replied " You know they can administer those treatment anytime I ask." RN stated, "I'll be happy to contact Resp, if you could have a seat back on the bed."  Pt angrily sat back n bed and bed alarm was reset.  Resp contacted about pt request for treatment.

## 2015-09-10 NOTE — Plan of Care (Signed)
Pt is high fall risk d/t unsteady gait and sleeping while standing up.  Pt stood at side of bed, set alarm off and preceeded to curse RN and aide out d/t having alarm on bed.  He tried to verbally throw them out of room and said he could stand whenever he wants.  RN explained we're trying to keep him safe and cannot leave room till he sits down safely.  Pt still insisted on continuing to stand by bed and told us we could leave now - since we could not stay in room for another hour.  Security called to encourage pt to plz return to bed.  Pt threatened to throw security out window and verbally attempted to throw them out of the room with profanity.  Pt eventually sat back on bed and bed alarm was reset.

## 2015-09-11 ENCOUNTER — Encounter (HOSPITAL_COMMUNITY): Payer: Self-pay | Admitting: Internal Medicine

## 2015-09-11 DIAGNOSIS — R7989 Other specified abnormal findings of blood chemistry: Secondary | ICD-10-CM

## 2015-09-11 HISTORY — DX: Other specified abnormal findings of blood chemistry: R79.89

## 2015-09-11 LAB — CBC WITH DIFFERENTIAL/PLATELET
Basophils Absolute: 0 10*3/uL (ref 0.0–0.1)
Basophils Relative: 0 %
Eosinophils Absolute: 0 10*3/uL (ref 0.0–0.7)
Eosinophils Relative: 0 %
HCT: 37.1 % — ABNORMAL LOW (ref 39.0–52.0)
Hemoglobin: 11.7 g/dL — ABNORMAL LOW (ref 13.0–17.0)
Lymphocytes Relative: 4 %
Lymphs Abs: 0.8 10*3/uL (ref 0.7–4.0)
MCH: 31.6 pg (ref 26.0–34.0)
MCHC: 31.5 g/dL (ref 30.0–36.0)
MCV: 100.3 fL — ABNORMAL HIGH (ref 78.0–100.0)
Monocytes Absolute: 0.6 10*3/uL (ref 0.1–1.0)
Monocytes Relative: 4 %
Neutro Abs: 16.1 10*3/uL — ABNORMAL HIGH (ref 1.7–7.7)
Neutrophils Relative %: 92 %
Platelets: 162 10*3/uL (ref 150–400)
RBC: 3.7 MIL/uL — ABNORMAL LOW (ref 4.22–5.81)
RDW: 14.5 % (ref 11.5–15.5)
WBC: 17.5 10*3/uL — ABNORMAL HIGH (ref 4.0–10.5)

## 2015-09-11 LAB — BASIC METABOLIC PANEL
Anion gap: 5 (ref 5–15)
BUN: 19 mg/dL (ref 6–20)
CO2: 37 mmol/L — ABNORMAL HIGH (ref 22–32)
Calcium: 8.7 mg/dL — ABNORMAL LOW (ref 8.9–10.3)
Chloride: 98 mmol/L — ABNORMAL LOW (ref 101–111)
Creatinine, Ser: 0.71 mg/dL (ref 0.61–1.24)
GFR calc Af Amer: >60 mL/min (ref >60)
GFR calc non Af Amer: >60 mL/min (ref >60)
Glucose, Bld: 197 mg/dL — ABNORMAL HIGH (ref 65–99)
Potassium: 4.3 mmol/L (ref 3.5–5.1)
Sodium: 140 mmol/L (ref 135–145)

## 2015-09-11 LAB — GLUCOSE, CAPILLARY
Glucose-Capillary: 121 mg/dL — ABNORMAL HIGH (ref 65–99)
Glucose-Capillary: 141 mg/dL — ABNORMAL HIGH (ref 65–99)
Glucose-Capillary: 203 mg/dL — ABNORMAL HIGH (ref 65–99)
Glucose-Capillary: 127 mg/dL — ABNORMAL HIGH (ref 65–99)

## 2015-09-11 LAB — T4, FREE: Free T4: 1.32 ng/dL — ABNORMAL HIGH (ref 0.61–1.12)

## 2015-09-11 LAB — TSH: TSH: 0.073 u[IU]/mL — ABNORMAL LOW (ref 0.350–4.500)

## 2015-09-11 MED ORDER — INSULIN GLARGINE 100 UNIT/ML ~~LOC~~ SOLN
15.0000 [IU] | Freq: Every day | SUBCUTANEOUS | Status: DC
  Administered 2015-09-11 – 2015-09-17 (×7): 15 [IU] via SUBCUTANEOUS
  Filled 2015-09-11 (×8): qty 0.15

## 2015-09-11 MED ORDER — IPRATROPIUM-ALBUTEROL 0.5-2.5 (3) MG/3ML IN SOLN
3.0000 mL | RESPIRATORY_TRACT | Status: DC
  Administered 2015-09-11 – 2015-09-15 (×24): 3 mL via RESPIRATORY_TRACT
  Filled 2015-09-11 (×23): qty 3

## 2015-09-11 MED ORDER — OXYCODONE HCL 5 MG PO TABS
5.0000 mg | ORAL_TABLET | ORAL | Status: DC | PRN
  Administered 2015-09-12 – 2015-09-13 (×3): 5 mg via ORAL
  Filled 2015-09-11 (×4): qty 1

## 2015-09-11 MED ORDER — DOXYCYCLINE HYCLATE 100 MG PO TABS
100.0000 mg | ORAL_TABLET | Freq: Two times a day (BID) | ORAL | Status: DC
  Administered 2015-09-11 – 2015-09-17 (×12): 100 mg via ORAL
  Filled 2015-09-11 (×14): qty 1

## 2015-09-11 MED ORDER — IPRATROPIUM-ALBUTEROL 0.5-2.5 (3) MG/3ML IN SOLN
3.0000 mL | Freq: Four times a day (QID) | RESPIRATORY_TRACT | Status: DC
  Administered 2015-09-11: 3 mL via RESPIRATORY_TRACT
  Filled 2015-09-11 (×2): qty 3

## 2015-09-11 NOTE — Progress Notes (Signed)
[facility] PROGRESS NOTE  [name] [name] KGM:[id number] DOB: [birth year] DOA: 09/09/2015 PCP: Daryel November, MD    Code Status: Full code Family Communication: Discussed with patient; family not available Disposition Plan: Discharge when clinically appropriate, possibly in the next 48 hours.   Consultants:  None  Procedures:  None  Antibiotics:  Doxycycline 11/12>>  HPI/Subjective: Patient says he may be breathing a little better , but  becomes extremely short of breath with any type of activity.  Objective: Filed Vitals:   09/11/15 1544  BP: 96/49  Pulse: 81  Temp: 97.8 F (36.6 C)  Resp: 18   oxygen saturation 91% on supplemental oxygen.  Intake/Output Summary (Last 24 hours) at 09/11/15 1733 Last data filed at 09/11/15 1300  Gross per 24 hour  Intake   1700 ml  Output    675 ml  Net   1025 ml   Filed Weights   09/09/15 1406  Weight: 76.204 kg (168 lb)    Exam:   General:  69 year old Caucasian man in no acute distress.  Cardiovascular: S1, S2, with a soft systolic murmur and S2 click.  Respiratory: Mostly coarse breath sounds with occasional wheezes. Breathing nonlabored.  Abdomen: Positive bowel sounds, soft, nontender, nondistended.  Musculoskeletal/extremities: No acute hot joints. 2+ bilateral lower extremity edema, pitting.  Neurologic: His speech is clear. He is alert and oriented 2. Cranial nerves II through XII are grossly intact.   Data Reviewed: Basic Metabolic Panel:  Recent Labs Lab 09/09/15 1429 09/10/15 0623 09/11/15 0627  NA 138 138 140  K 3.5 4.3 4.3  CL 101 98* 98*  CO2 30 32 37*  GLUCOSE 75 225* 197*  BUN CREATININE 0.71 0.78 0.71  CALCIUM 8.6* 8.5* 8.7*   Liver Function Tests:  Recent Labs Lab 09/09/15 1429  AST 23  ALT 35  ALKPHOS 55  BILITOT 0.9  PROT 5.9*  ALBUMIN 3.2*   No results for input(s): LIPASE, AMYLASE in the last 168 hours. No results for input(s): AMMONIA in the last 168  hours. CBC:  Recent Labs Lab 09/09/15 1429 09/10/15 0623 09/11/15 0627  WBC 14.3* 10.4 17.5*  NEUTROABS 11.7*  --  16.1*  HGB 12.1* 12.3* 11.7*  HCT 37.6* 38.6* 37.1*  MCV 99.7 100.8* 100.3*  PLT 132* 143* 162   Cardiac Enzymes:  Recent Labs Lab 09/09/15 1429  TROPONINI <0.03   BNP (last 3 results)  Recent Labs  03/06/15 1657 08/19/15 1550 09/09/15 1429  BNP 92.0 100.0 124.0*    ProBNP (last 3 results)  Recent Labs  09/24/14 0704  PROBNP 798.5*    CBG:  Recent Labs Lab 09/10/15 1645 09/10/15 2159 09/11/15 0804 09/11/15 1209 09/11/15 1642  GLUCAP 224* 150* 121* 141* 203*    No results found for this or any previous visit (from the past 240 hour(s)).   Studies: No results found.  Scheduled Meds: . aspirin EC  81 mg Oral Daily  . budesonide-formoterol  2 puff Inhalation BID  . docusate sodium  100 mg Oral Daily  . doxycycline  100 mg Oral Q12H  . furosemide  40 mg Intravenous Daily  . insulin aspart  0-20 Units Subcutaneous TID WC  . insulin aspart  0-5 Units Subcutaneous QHS  . ipratropium-albuterol  3 mL Nebulization Q4H  . methylPREDNISolone (SOLU-MEDROL) injection  60 mg Intravenous Q12H  . potassium chloride SA  20 mEq Oral Daily  . tamsulosin  0.4 mg Oral QHS   Continuous Infusions:   Principal  Problem:   COPD exacerbation (HCC) Active Problems:   Chronic systolic CHF (congestive heart failure) (HCC)   Acute respiratory failure with hypoxia (HCC)   H/O aortic valve replacement   History of permanent cardiac pacemaker placement   Bipolar 1 disorder (HCC)   Tobacco abuse   Hyperglycemia, drug-induced   Thrombocytopenia (HCC)   1. COPD with exacerbation causing acute respiratory failure with hypoxia. [facility] secondary to acute respiratory failure with hypercapnia secondary to COPD exacerbation. Reportedly, he was [facility] approximately 6 days ago on a  prednisone taper-he stated that he was hospitalized there for 9 days. Patient presents to the hospital frequently, in part, because of his transient lifestyle and noncompliance. He does not require chronic home oxygen. -On admission, it was reported that he was hypoxic. His proBNP was only 124. His troponin I was negative. His chest x-ray revealed hyperinflated lungs consistent with COPD, otherwise no acute disease. -Patient was started on treatment with doxycycline, IV Solu-Medrol, and albuterol/Atrovent nebulizers. Symbicort was restarted. Oxygen was applied and titrated to keep his oxygen saturations greater than 90%. - Patient looks better and his lungs don't sound any worse , but the patient says that he is still quite symptomatic , so DuoNeb was increased to every 4 hours.  Tobacco abuse. The patient was advised to stop smoking.  Nicotine offered, but he declined.  Chronic systolic congestive heart failure, with mild acute decompensation. Patient has a history of porcine aortic valve replacement, thoracic aortic aneurysm repair, and permanent pacemaker. His echo 09/2014 revealed an EF of 40-45%. He is treated chronically with Lasix. -He reports not taking an ACE inhibitor or beta blocker, for some unknown reason. -Patient has a history of noncompliance, so will treat his mild decompensated heart failure with Lasix which will be changed to IV given some mild to moderate peripheral edema. His BNP was only 125 and there was no pulmonary edema on his chest x-ray.  Steroid-induced hyperglycemia. The patient's venous glucose was within normal limits on admission, but has increased over 200 following IV steroids. -Sliding-scale NovoLog was started. We'll add Lantus for better control.  Thrombocytopenia. Patient's platelet count was 132 on admission. In review of his recent chart, there has been no recent history of thrombocytopenia. -Further evaluation, we'll check a vitamin B12 level -We'll  continue antiplatelet therapy for now with close monitoring.  - his vitamin B12 was within normal limits.  Bipolar disorder. Patient is not treated with any type of pharmacological medication, likely due to noncompliance. He has a history of becoming easily angered, belligerent behavior, and leaving AMA. -We'll provide him with supportive treatment.  Abnormal thyroid function tests.  Patient started function was assessed. His TSH was low at 0.73 and his free T4 was elevated at 1.32. Will order  free T3 and consider endocrinology consultation inpatient versus outpatient.      Time spent: 30 minutes    [facility] TattnallFISHER,Shanetta Nicolls  [facility] Pager [phone]. If 7PM-7AM, please contact night-coverage at [email or web address], password [email or web address] 09/11/2015, 5:33 PM  LOS: 1 day

## 2015-09-11 NOTE — Care Management Note (Signed)
Case Management Note  Patient Details  Name: Jennings Booksndrew H Noller MRN: [id number] Date of Birth: 04/11/1946  Subjective/Objective:                  Pt admitted with COPD. Pt lives out of his truck, hotels, and with friends. Pt is independent with ADL's. Pt has a neb machine for home use.   Action/Plan: Pt states that he does have a friend that could come pick him up at discharge. Pt is unsure at this time where he is going to live at discharge due to the fact that he states he dies. CM did offer list of homeless shelters but pt does not want to go there at discharge. Pt states he will check with the [employer] to see if they can assist with a hotel. CM did given list of homeless shelters. Will continue to follow for discharge planning needs.  Expected Discharge Date:                  Expected Discharge Plan:  Home/Self Care  In-House Referral:     Discharge planning Services  CM Consult  Post Acute Care Choice:    Choice offered to:     DME Arranged:    DME Agency:     HH Arranged:    HH Agency:     Status of Service:  In process, will continue to follow  Medicare Important Message Given:    Date Medicare IM Given:    Medicare IM give by:    Date Additional Medicare IM Given:    Additional Medicare Important Message give by:     If discussed at Long Length of Stay Meetings, dates discussed:    Additional Comments:  Cheryl FlashBlackwell, Mohamedamin Nifong Crowder, RN 09/11/2015, 11:09 AM

## 2015-09-12 LAB — BASIC METABOLIC PANEL
Anion gap: 5 (ref 5–15)
BUN: 21 mg/dL — ABNORMAL HIGH (ref 6–20)
CO2: 36 mmol/L — ABNORMAL HIGH (ref 22–32)
Calcium: 8.8 mg/dL — ABNORMAL LOW (ref 8.9–10.3)
Chloride: 102 mmol/L (ref 101–111)
Creatinine, Ser: 0.62 mg/dL (ref 0.61–1.24)
GFR calc Af Amer: >60 mL/min (ref >60)
GFR calc non Af Amer: >60 mL/min (ref >60)
Glucose, Bld: 179 mg/dL — ABNORMAL HIGH (ref 65–99)
Potassium: 4.2 mmol/L (ref 3.5–5.1)
Sodium: 143 mmol/L (ref 135–145)

## 2015-09-12 LAB — CBC
HCT: 34.9 % — ABNORMAL LOW (ref 39.0–52.0)
Hemoglobin: 11.3 g/dL — ABNORMAL LOW (ref 13.0–17.0)
MCH: 32.6 pg (ref 26.0–34.0)
MCHC: 32.4 g/dL (ref 30.0–36.0)
MCV: 100.6 fL — ABNORMAL HIGH (ref 78.0–100.0)
Platelets: 148 10*3/uL — ABNORMAL LOW (ref 150–400)
RBC: 3.47 MIL/uL — ABNORMAL LOW (ref 4.22–5.81)
RDW: 14.5 % (ref 11.5–15.5)
WBC: 15.6 10*3/uL — ABNORMAL HIGH (ref 4.0–10.5)

## 2015-09-12 LAB — GLUCOSE, CAPILLARY
Glucose-Capillary: 101 mg/dL — ABNORMAL HIGH (ref 65–99)
Glucose-Capillary: 256 mg/dL — ABNORMAL HIGH (ref 65–99)
Glucose-Capillary: 115 mg/dL — ABNORMAL HIGH (ref 65–99)
Glucose-Capillary: 215 mg/dL — ABNORMAL HIGH (ref 65–99)

## 2015-09-12 MED ORDER — METHYLPREDNISOLONE SODIUM SUCC 125 MG IJ SOLR
60.0000 mg | INTRAMUSCULAR | Status: DC
Start: 2015-09-13 — End: 2015-09-17
  Administered 2015-09-13 – 2015-09-17 (×5): 60 mg via INTRAVENOUS
  Filled 2015-09-12 (×5): qty 2

## 2015-09-12 MED ORDER — MAGNESIUM CITRATE PO SOLN
1.0000 | Freq: Once | ORAL | Status: AC
Start: 2015-09-12 — End: 2015-09-12
  Administered 2015-09-12: 1 via ORAL
  Filled 2015-09-12: qty 296

## 2015-09-12 MED ORDER — FUROSEMIDE 40 MG PO TABS
40.0000 mg | ORAL_TABLET | Freq: Every day | ORAL | Status: DC
  Administered 2015-09-13 – 2015-09-19 (×7): 40 mg via ORAL
  Filled 2015-09-12 (×7): qty 1

## 2015-09-12 NOTE — Progress Notes (Signed)
[facility] PROGRESS NOTE  GAY RAPE WUJ:[id number] DOB: [birth year] DOA: 09/09/2015 PCP: Daryel November, MD    Code Status: Full code Family Communication: Discussed with patient; family not available Disposition Plan: Discharge when clinically appropriate, possibly in the next 48 hours.   Consultants:  None  Procedures:  None  Antibiotics:  Doxycycline 11/12>>  HPI/Subjective: Patient continues to complain of shortness of breath with little activity. He denies chest pain.  Objective: Filed Vitals:   09/12/15 1332  BP: 102/71  Pulse: 102  Temp: 98.1 F (36.7 C)  Resp: 18   oxygen saturation 96% on supplemental oxygen.  Intake/Output Summary (Last 24 hours) at 09/12/15 1805 Last data filed at 09/12/15 1300  Gross per 24 hour  Intake    720 ml  Output    800 ml  Net    -80 ml   Filed Weights   09/09/15 1406  Weight: 76.204 kg (168 lb)    Exam:   General:  69 year old Caucasian man in no acute distress.  Cardiovascular: S1, S2, with a soft systolic murmur and S2 click.  Respiratory: Mostly coarse breath sounds with no significant wheezes. Breathing nonlabored.  Abdomen: Positive bowel sounds, soft, nontender, nondistended.  Musculoskeletal/extremities: No acute hot joints. Decrease in bilateral lower extremity edema to trace to 1+.  Neurologic: His speech is clear. He is alert and oriented 2. Cranial nerves II through XII are grossly intact.   Data Reviewed: Basic Metabolic Panel:  Recent Labs Lab 09/09/15 1429 09/10/15 0623 09/11/15 0627 09/12/15 0648  NA 138 138 140 143  K 3.5 4.3 4.3 4.2  CL 101 98* 98* 102  CO2 30 32 37* 36*  GLUCOSE 75 225* 197* 179*  BUN 21*  CREATININE 0.71 0.78 0.71 0.62  CALCIUM 8.6* 8.5* 8.7* 8.8*   Liver Function Tests:  Recent Labs Lab 09/09/15 1429  AST 23  ALT 35  ALKPHOS 55  BILITOT 0.9  PROT 5.9*  ALBUMIN 3.2*   No results for input(s): LIPASE, AMYLASE in the last 168  hours. No results for input(s): AMMONIA in the last 168 hours. CBC:  Recent Labs Lab 09/09/15 1429 09/10/15 0623 09/11/15 0627 09/12/15 0648  WBC 14.3* 10.4 17.5* 15.6*  NEUTROABS 11.7*  --  16.1*  --   HGB 12.1* 12.3* 11.7* 11.3*  HCT 37.6* 38.6* 37.1* 34.9*  MCV 99.7 100.8* 100.3* 100.6*  PLT 132* 143* 162 148*   Cardiac Enzymes:  Recent Labs Lab 09/09/15 1429  TROPONINI <0.03   BNP (last 3 results)  Recent Labs  03/06/15 1657 08/19/15 1550 09/09/15 1429  BNP 92.0 100.0 124.0*    ProBNP (last 3 results)  Recent Labs  09/24/14 0704  PROBNP 798.5*    CBG:  Recent Labs Lab 09/11/15 1642 09/11/15 2200 09/12/15 0804 09/12/15 1136 09/12/15 1611  GLUCAP 203* 127* 101* 256* 115*    No results found for this or any previous visit (from the past 240 hour(s)).   Studies: No results found.  Scheduled Meds: . aspirin EC  81 mg Oral Daily  . budesonide-formoterol  2 puff Inhalation BID  . docusate sodium  100 mg Oral Daily  . doxycycline  100 mg Oral Q12H  . furosemide  40 mg Intravenous Daily  . insulin aspart  0-20 Units Subcutaneous TID WC  . insulin aspart  0-5 Units Subcutaneous QHS  . insulin glargine  15 Units Subcutaneous QHS  . ipratropium-albuterol  3 mL Nebulization Q4H  . methylPREDNISolone (SOLU-MEDROL) injection  60 mg Intravenous Q12H  . potassium chloride SA  20 mEq Oral Daily  . tamsulosin  0.4 mg Oral QHS   Continuous Infusions:   Principal Problem:   COPD exacerbation (HCC) Active Problems:   Chronic systolic CHF (congestive heart failure) (HCC)   Acute respiratory failure with hypoxia (HCC)   H/O aortic valve replacement   History of permanent cardiac pacemaker placement   Bipolar 1 disorder (HCC)   Tobacco abuse   Hyperglycemia, drug-induced   Thrombocytopenia (HCC)   Abnormal thyroid blood test   1. COPD with exacerbation causing acute respiratory failure with hypoxia. [facility]  [facility] secondary to acute respiratory failure with hypercapnia secondary to COPD exacerbation. Reportedly, [facility] was [facility] 6 days ago on a prednisone taper-he stated that he was hospitalized there for 9 days. Patient presents to the hospital frequently, in part, because of his transient lifestyle and noncompliance. He does not require chronic home oxygen. -On admission, it was reported that he was hypoxic. His proBNP was only 124. His troponin I was negative. His chest x-ray revealed hyperinflated lungs consistent with COPD, otherwise no acute disease. -Patient was started on treatment with doxycycline, IV Solu-Medrol, and albuterol/Atrovent nebulizers. Symbicort was restarted. Oxygen was applied and titrated to keep his oxygen saturations greater than 90%. - Patient appears to be improving slowly. -We will decrease Solu-Medrol to once every 24 hours and then subsequently start on prednisone. -Patient will likely need another 24-48 hours of hospitalization with potential discharge on Thursday.  Tobacco abuse. The patient was advised to stop smoking.  Nicotine offered, but he declined.  Chronic systolic congestive heart failure, with mild acute decompensation. Patient has a history of porcine aortic valve replacement, thoracic aortic aneurysm repair, and permanent pacemaker. His echo 09/2014 revealed an EF of 40-45%. He is treated chronically with Lasix. -He reports not taking an ACE inhibitor or beta blocker, for some unknown reason. -His BNP was only 125 on admission and there was no evidence of pulmonary edema on the chest x-ray. -Patient has a history of noncompliance, so Lasix was restarted, but given IV for mild decompensated heart failure. -She has significantly less lower extremity edema, so we'll transition Lasix back to by mouth tomorrow.  Steroid-induced hyperglycemia. The patient's venous glucose was within normal limits on admission, but has  increased over 200 following IV steroids. -Sliding-scale NovoLog and Lantus were started. His CBGs are better. With weaning down Solu-Medrol, his CBGs should improve.  Thrombocytopenia. Patient's platelet count was 132 on admission. In review of his recent chart, there has been no recent history of thrombocytopenia. -We'll continue antiplatelet therapy for now with close monitoring. -His vitamin B12 was within normal limits.  Bipolar disorder. Patient is not treated with any type of pharmacological medication, likely due to noncompliance. He has a history of becoming easily angered, belligerent behavior, and leaving AMA. -We'll provide him with supportive treatment.  Abnormal thyroid function tests.  Patient thyroid  function was assessed. His TSH was low at 0.73 and his free T4 was elevated at 1.32. These lab results are not significantly abnormal, but a free T3 will be ordered for further evaluation.  -If it is abnormal, would consider endocrinology consultation inpatient versus outpatient.       Time spent: 30 minutes    [facility] CenterFISHER,Sorrel Cassetta  [facility] Pager [phone]. If 7PM-7AM, please contact night-coverage at [email or web address], password [email or web address] 09/12/2015, 6:05 PM  LOS: 2 days

## 2015-09-13 DIAGNOSIS — F319 Bipolar disorder, unspecified: Secondary | ICD-10-CM

## 2015-09-13 DIAGNOSIS — D696 Thrombocytopenia, unspecified: Secondary | ICD-10-CM

## 2015-09-13 DIAGNOSIS — I5022 Chronic systolic (congestive) heart failure: Secondary | ICD-10-CM

## 2015-09-13 DIAGNOSIS — J441 Chronic obstructive pulmonary disease with (acute) exacerbation: Principal | ICD-10-CM

## 2015-09-13 DIAGNOSIS — Z720 Tobacco use: Secondary | ICD-10-CM

## 2015-09-13 DIAGNOSIS — R946 Abnormal results of thyroid function studies: Secondary | ICD-10-CM

## 2015-09-13 DIAGNOSIS — J9601 Acute respiratory failure with hypoxia: Secondary | ICD-10-CM

## 2015-09-13 LAB — GLUCOSE, CAPILLARY
Glucose-Capillary: 57 mg/dL — ABNORMAL LOW (ref 65–99)
Glucose-Capillary: 133 mg/dL — ABNORMAL HIGH (ref 65–99)
Glucose-Capillary: 239 mg/dL — ABNORMAL HIGH (ref 65–99)
Glucose-Capillary: 112 mg/dL — ABNORMAL HIGH (ref 65–99)

## 2015-09-13 LAB — T3, FREE: T3, Free: 2.7 pg/mL (ref 2.0–4.4)

## 2015-09-13 MED ORDER — GUAIFENESIN ER 600 MG PO TB12
1200.0000 mg | ORAL_TABLET | Freq: Two times a day (BID) | ORAL | Status: DC
  Administered 2015-09-13 – 2015-09-19 (×13): 1200 mg via ORAL
  Filled 2015-09-13 (×13): qty 2

## 2015-09-13 NOTE — Progress Notes (Signed)
[facility] PROGRESS NOTE  Jennings Booksndrew H Coey WUJ:[id number] DOB: 04/03/1946 DOA: 09/09/2015 PCP: Daryel NovemberMILLER,GARY, MD  Assessment/Plan: 1. COPD exacerbation. Improving with IV abx, IV steroids, bronchodilators, and supplemental O2. Will add Mucinex and flutter valve.  2. Acute hypoxic respiratory failure, improving. Will check O2 sats on RA.  3. Tobacco abuse, counseled on cessation. 4. Chronic systolic congestive heart failure with evidence of mild acute decompensation. He reported noncompliance with ACE-I and BB. CXR unremarkable and BNP 125 on admission. Will continue PO Lasix. 5. Steroid induced hyperglycemia. Will continue SSI and monitor. 6. Thrombocytopenia. B12 wnl. Will continue antiplatelet therapy and monitor. 7. Bipolar disorder, he has refused medications. 8. Abnormal thyroid function tests, TSH 0.73 and free T4 1.32.  Free T3 2.7. Will recommend outpatient follow up.   Code Status: Full DVT prophylaxis: SCDs Family Communication: Discussed with patient who understands and has no concerns at this time. Disposition Plan: Anticipate discharge in the next 1-2 days.    Consultants:  None  Procedures:  None  Antibiotics:  Doxycycline 11/12>>  HPI/Subjective: Feels a little better but still has SOB and does not feel like "I'm where I need to be".   Objective: Filed Vitals:   09/13/15 0637  BP: 115/69  Pulse: 86  Temp: 98.5 F (36.9 C)  Resp: 18    Intake/Output Summary (Last 24 hours) at 09/13/15 0803 Last data filed at 09/13/15 [id number]  Gross per 24 hour  Intake    720 ml  Output   2000 ml  Net  -1280 ml   Filed Weights   09/09/15 1406  Weight: 76.204 kg (168 lb)    Exam:  General: NAD, looks comfortable Cardiovascular: RRR, S1, S2  Respiratory: Mild faint wheezes at the bases. No rales or rhonchi. Speaking in full sentences.  Abdomen: soft, non tender, no distention , bowel sounds normal Musculoskeletal: Trace edema BLE.    Data  Reviewed: Basic Metabolic Panel:  Recent Labs Lab 09/09/15 1429 09/10/15 0623 09/11/15 0627 09/12/15 0648  NA 138 138 140 143  K 3.5 4.3 4.3 4.2  CL 101 98* 98* 102  CO2 30 32 37* 36*  GLUCOSE 75 225* 197* 179*  BUN 15 20 19  21*  CREATININE 0.71 0.78 0.71 0.62  CALCIUM 8.6* 8.5* 8.7* 8.8*   Liver Function Tests:  Recent Labs Lab 09/09/15 1429  AST 23  ALT 35  ALKPHOS 55  BILITOT 0.9  PROT 5.9*  ALBUMIN 3.2*    CBC:  Recent Labs Lab 09/09/15 1429 09/10/15 0623 09/11/15 0627 09/12/15 0648  WBC 14.3* 10.4 17.5* 15.6*  NEUTROABS 11.7*  --  16.1*  --   HGB 12.1* 12.3* 11.7* 11.3*  HCT 37.6* 38.6* 37.1* 34.9*  MCV 99.7 100.8* 100.3* 100.6*  PLT 132* 143* 162 148*   Cardiac Enzymes:  Recent Labs Lab 09/09/15 1429  TROPONINI <0.03   BNP (last 3 results)  Recent Labs  03/06/15 1657 08/19/15 1550 09/09/15 1429  BNP 92.0 100.0 124.0*    ProBNP (last 3 results)  Recent Labs  09/24/14 0704  PROBNP 798.5*    CBG:  Recent Labs Lab 09/12/15 0804 09/12/15 1136 09/12/15 1611 09/12/15 2029 09/13/15 0711  GLUCAP 101* 256* 115* 215* 57*       Studies: No results found.  Scheduled Meds: . aspirin EC  81 mg Oral Daily  . budesonide-formoterol  2 puff Inhalation BID  . docusate sodium  100 mg Oral Daily  . doxycycline  100 mg Oral Q12H  . furosemide  40 mg Oral Daily  . insulin aspart  0-20 Units Subcutaneous TID WC  . insulin aspart  0-5 Units Subcutaneous QHS  . insulin glargine  15 Units Subcutaneous QHS  . ipratropium-albuterol  3 mL Nebulization Q4H  . methylPREDNISolone (SOLU-MEDROL) injection  60 mg Intravenous Q24H  . potassium chloride SA  20 mEq Oral Daily  . tamsulosin  0.4 mg Oral QHS   Continuous Infusions:   Principal Problem:   COPD exacerbation (HCC) Active Problems:   H/O aortic valve replacement   History of permanent cardiac pacemaker placement   Bipolar 1 disorder (HCC)   Tobacco abuse   Hyperglycemia,  drug-induced   Chronic systolic CHF (congestive heart failure) (HCC)   Acute respiratory failure with hypoxia (HCC)   Thrombocytopenia (HCC)   Abnormal thyroid blood test    Time spent: 20 minutes  Jehanzeb Memon. MD  [facility] Pager [phone]. If 7PM-7AM, please contact night-coverage at [email or web address], password [facility] 09/13/2015, 8:03 AM  LOS: 3 days     By signing my name below, I, Burnett Harry, attest that this documentation has been prepared under the direction and in the presence of [facility]. MD Electronically Signed: Burnett Harry, Scribe. 09/13/2015 1:07pm    I, Dr. Erick Blinks, personally performed the services described in this documentaiton. All medical record entries made by the scribe were at my direction and in my presence. I have reviewed the chart and agree that the record reflects my personal performance and is accurate and complete  Erick Blinks, MD, 09/13/2015 1:27 PM

## 2015-09-13 NOTE — Progress Notes (Signed)
Respiratory Note: Patient was walked early today and no notes were made about the exercise. At 2100 I walked the patient again around the 300 floor. This included walking down past the staff elevator, around the back hall, down and back up the incline and to his room. The patient was carrying on a conversation during the trip around the floor ( with no problems). The patient coughed a few times and had to stop once to catch his breathe. During the exercise the patient saturation started out as 96% on room air and heart rate of 95. His saturations dropped once he went down the incline and back up ( that was when the patient had to stop and catch his breathe; his saturations dropped at that time 87% and heart rate was 96 on room air). The patient was returned to his room and placed back on his nasal canula for recovery( his saturations at this was 90% on room air and heart rate was 93) and instructed to wear until he felt his SOB decrease.

## 2015-09-13 NOTE — Progress Notes (Signed)
Inpatient Diabetes Program Recommendations  AACE/ADA: New Consensus Statement on Inpatient Glycemic Control (2015)  Target Ranges:  Prepandial:   less than 140 mg/dL      Peak postprandial:   less than 180 mg/dL (1-2 hours)      Critically ill patients:  140 - 180 mg/dL   Review of Glycemic Control: Results for [name] [name], [name] [name] (MRN [id number]) as of 09/13/2015 13:01  Ref. Range 09/12/2015 11:36 09/12/2015 16:11 09/12/2015 20:29 09/13/2015 07:11 09/13/2015 11:44  Glucose-Capillary Latest Ref Range: 65-99 mg/dL 409256 ([name]) 811115 ([name]) 914215 ([name]) 57 (L) 133 ([name])     Diabetes history: None noted- Note blood sugar likely increased with steroids Outpatient Diabetes medications: None Current orders for Inpatient glycemic control:  Novolog resist tid with meals and HS, Lantus 15 units q HS  Inpatient Diabetes Program Recommendations:    Consider d/c of Lantus due to low fasting blood sugar this morning.    Thanks, Beryl MeagerJenny Jaedin Regina, RN, BC-ADM Inpatient Diabetes Coordinator Pager [phone])

## 2015-09-14 LAB — GLUCOSE, CAPILLARY
Glucose-Capillary: 116 mg/dL — ABNORMAL HIGH (ref 65–99)
Glucose-Capillary: 138 mg/dL — ABNORMAL HIGH (ref 65–99)
Glucose-Capillary: 316 mg/dL — ABNORMAL HIGH (ref 65–99)
Glucose-Capillary: 85 mg/dL (ref 65–99)

## 2015-09-14 MED ORDER — IPRATROPIUM-ALBUTEROL 0.5-2.5 (3) MG/3ML IN SOLN: 3.0000 mL | RESPIRATORY_TRACT | Status: AC | PRN

## 2015-09-14 MED ORDER — GUAIFENESIN ER 600 MG PO TB12: 1200.0000 mg | ORAL_TABLET | Freq: Two times a day (BID) | ORAL | Status: DC

## 2015-09-14 MED ORDER — PREDNISONE 10 MG PO TABS: ORAL_TABLET | ORAL | Status: DC

## 2015-09-14 NOTE — Progress Notes (Addendum)
Mr. Bonney RousselBoswell's O2 sats = 97% while sitting at bedside; 94% while ambulating approximately 200 feet.

## 2015-09-14 NOTE — Discharge Summary (Signed)
Physician Discharge Summary  [name] [name] WUJ:[id number] DOB: 07/06/1946 DOA: 09/09/2015  PCP: Daryel NovemberMILLER,GARY, MD  Admit date: 09/09/2015 Discharge date: 09/14/2015  Time spent: 35 minutes  Recommendations for Outpatient Follow-up:  1. Follow up with PCP in 1-2 weeks. 2. Consider outpatient endocrinology referral for abnormal thyroid tests.   Discharge Diagnoses:  Principal Problem:   COPD exacerbation (HCC) Active Problems:   H/O aortic valve replacement   History of permanent cardiac pacemaker placement   Bipolar 1 disorder (HCC)   Tobacco abuse   Hyperglycemia, drug-induced   Chronic systolic CHF (congestive heart failure) (HCC)   Acute respiratory failure with hypoxia (HCC)   Thrombocytopenia (HCC)   Abnormal thyroid blood test   Discharge Condition: Improved  Diet recommendation: Heart Healthy  Filed Weights   09/09/15 1406  Weight: 76.204 kg (168 lb)    History of present illness:  69 y.o. 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 Patient was hospitalized here from 10/22 until 10/24 for acute respiratory failure with COPD exacerbation. After being discharged, he was [facility] same problem. He was discharged 6 days ago with a Prednisone taper. On this admission, he presented with increasing SOB, worse on exertion and a cough productive of white sputum. He was admitted for further management.    Hospital Course:  COPD exacerbation improved with IV abx, IV steroids, bronchodilators, and supplemental O2. Patient was also given Mucinex and a flutter valve with significant improvement. His wheezing has resolved. He has completed a course of abx. Will be discharged on a Prednisone taper and nebulizer treatments.   Acute hypoxic respiratory failure resolved with supplemental  O2. Patient is now breathing comfortably with stable vital signs on RA. He was also able to ambulate with stable O2 sats.    Tobacco abuse, counseled on cessation.  Chronic systolic congestive heart failure with evidence of mild acute decompensation. He reported noncompliance with ACE-I and BB. CXR unremarkable and BNP 125 on admission. Improved with PO Lasix. Continue outpatient regimen.   Steroid induced hyperglycemia. Anticipate resolution with cessation of steroid use.   Thrombocytopenia. B12 wnl.   Bipolar disorder, he refused medications.  Abnormal thyroid function tests, TSH 0.73 and free T4 1.32. Free T3 2.7. Recommended outpatient follow up.  Procedures:  none  Consultations:  none  Discharge Exam: Filed Vitals:   09/14/15 0609  BP: 103/62  Pulse: 88  Temp: 98.1 F (36.7 C)  Resp: 18     General: NAD, looks comfortable. Speaking in full sentences.  Cardiovascular: RRR, S1, S2   Respiratory: Mild faint wheezes at the bases. No rales or rhonchi. No rales or rhonchi  Abdomen: soft, non tender, no distention , bowel sounds normal  Musculoskeletal: Trace edema b/l   Discharge Instructions   Discharge Instructions    Diet - low sodium heart healthy    Complete by:  As directed      Increase activity slowly    Complete by:  As directed           Current Discharge Medication List    START taking these medications   Details  guaiFENesin (MUCINEX) 600 MG 12 hr tablet Take 2 tablets (1,200 mg total) by mouth 2 (two) times daily. Qty: 30 tablet, Refills: 0      CONTINUE these medications which have CHANGED   Details  ipratropium-albuterol (DUONEB) 0.5-2.5 (3) MG/3ML SOLN Inhale 3 mLs into the lungs every 4 (four) hours as needed (for shortness of breath). Qty: 360 mL, Refills: 1    predniSONE (DELTASONE) 10 MG tablet Take  po daily for 2 days then  daily for 2 days then  daily for 2 days then  daily for 2 days then stop Qty: 20  tablet, Refills: 0      CONTINUE these medications which have NOT CHANGED   Details  albuterol (PROVENTIL HFA;VENTOLIN HFA) 108 (90 BASE) MCG/ACT inhaler Inhale 2 puffs into the lungs every 6 (six) hours as needed for wheezing or shortness of breath. Qty: 1 Inhaler, Refills: 2    aspirin EC 81 MG tablet Take 81 mg by mouth daily.    docusate sodium (COLACE) 100 MG capsule Take 100 mg by mouth daily.    folic acid (FOLVITE) 1 MG tablet Take 1 tablet (1 mg total) by mouth daily. Qty: 30 tablet, Refills: 0    furosemide (LASIX) 40 MG tablet Take 1 tablet (40 mg total) by mouth daily. Qty: 30 tablet, Refills: 0    potassium chloride SA (K-DUR,KLOR-CON) 20 MEQ tablet Take 20 mEq by mouth daily.    tamsulosin (FLOMAX) 0.4 MG CAPS capsule Take 0.4 mg by mouth at bedtime.    vardenafil (LEVITRA) 10 MG tablet Take 10 mg by mouth as needed for erectile dysfunction.    SYMBICORT 160-4.5 MCG/ACT inhaler INHALE TWO PUFFS BY MOUTH TWICE DAILY Refills: 0      STOP taking these medications     azithromycin (ZITHROMAX) 250 MG tablet        Allergies  Allergen Reactions  . Tiotropium Bromide Monohydrate Nausea And Vomiting, Other (See Comments) and Nausea Only    Tremors Reaction:  Tremors  . Morphine And Related Other (See Comments)    Reaction:  Hallucinations       The results of significant diagnostics from this hospitalization (including imaging, microbiology, ancillary and laboratory) are listed below for reference.    Significant Diagnostic Studies: Dg Chest 2 View  09/09/2015  CLINICAL DATA:  Shortness of breath. Heart surgery in 2015. Pacemaker placed in 2007. EXAM: CHEST  2 VIEW COMPARISON:  Chest x-ray dated 08/22/2015. FINDINGS: Heart size is within normal limits and unchanged. Overall cardiomediastinal silhouette is stable in size and configuration. Median sternotomy wires appear intact and stable in alignment. Valve replacement hardware appears stable in position. Left  chest wall pacemaker/ AICD appears stable in position. Lungs are hyperexpanded consistent with COPD. Lungs are clear. No evidence of pneumonia. No pleural effusions seen. No pneumothorax. Mild degenerative change again noted within the thoracic spine. No acute osseous abnormality. Surgical clips present over the right upper chest wall. IMPRESSION: 1. Hyperexpanded lungs consistent with COPD. 2. Surgical changes as described above. 3. Stable chest x-ray. No evidence of acute cardiopulmonary abnormality. Lungs are clear. Electronically Signed   By: Bary Richard M.D.   On: 09/09/2015 14:57     Labs: Basic Metabolic Panel:  Recent Labs Lab 09/09/15 1429 09/10/15 0623 09/11/15 0627 09/12/15 0648  NA 138 138 140 143  K 3.5 4.3 4.3 4.2  CL 101 98* 98* 102  CO2 30 32 37* 36*  GLUCOSE 75 225* 197* 179*  BUN 21*  CREATININE 0.71 0.78 0.71 0.62  CALCIUM 8.6* 8.5* 8.7* 8.8*   Liver Function Tests:  Recent Labs Lab 09/09/15 1429  AST 23  ALT 35  ALKPHOS 55  BILITOT 0.9  PROT 5.9*  ALBUMIN 3.2*   CBC:  Recent Labs Lab 09/09/15 1429 09/10/15 0623 09/11/15 0627 09/12/15 0648  WBC 14.3* 10.4 17.5* 15.6*  NEUTROABS 11.7*  --  16.1*  --   HGB 12.1* 12.3* 11.7* 11.3*  HCT 37.6* 38.6* 37.1* 34.9*  MCV 99.7 100.8* 100.3* 100.6*  PLT 132* 143* 162 148*   Cardiac Enzymes:  Recent Labs Lab 09/09/15 1429  TROPONINI <0.03   BNP: BNP (last 3 results)  Recent Labs  03/06/15 1657 08/19/15 1550 09/09/15 1429  BNP 92.0 100.0 124.0*    ProBNP (last 3 results)  Recent Labs  09/24/14 0704  PROBNP 798.5*    CBG:  Recent Labs Lab 09/13/15 1144 09/13/15 1627 09/13/15 2027 09/14/15 0725 09/14/15 1112  GLUCAP 133* 239* 112* 116* 138*       Signed:  Jehanzeb Memon. MD [facility] 09/14/2015, 2:25 PM    By signing my name below, I, Burnett Harry, attest that this documentation has been prepared under the direction and in the presence of  [facility]. MD Electronically Signed: Burnett Harry, Scribe.  09/14/2015 2:09pm  I, Dr. Erick Blinks, personally performed the services described in this documentaiton. All medical record entries made by the scribe were at my direction and in my presence. I have reviewed the chart and agree that the record reflects my personal performance and is accurate and complete  Erick Blinks, MD, 09/14/2015 2:25 PM

## 2015-09-14 NOTE — Care Management Note (Signed)
Case Management Note  Patient Details  Name: Jennings Booksndrew H Griffy MRN: [id number] Date of Birth: 11/17/1945  Subjective/Objective:                    Action/Plan:   Expected Discharge Date:                  Expected Discharge Plan:  Home/Self Care  In-House Referral:     Discharge planning Services  CM Consult  Post Acute Care Choice:    Choice offered to:     DME Arranged:    DME Agency:     HH Arranged:    HH Agency:     Status of Service:  In process, will continue to follow  Medicare Important Message Given:    Date Medicare IM Given:    Medicare IM give by:    Date Additional Medicare IM Given:    Additional Medicare Important Message give by:     If discussed at Long Length of Stay Meetings, dates discussed:09/14/15    Additional Comments:  Cheryl FlashBlackwell, Burlon Centrella Crowder, RN 09/14/2015, 2:06 PM

## 2015-09-14 NOTE — Care Management Note (Signed)
Case Management Note  Patient Details  Name: [name] [name] MRN: [id number] Date of Birth: 09/13/1946  Subjective/Objective:                    Action/Plan:   Expected Discharge Date:                  Expected Discharge Plan:  Home/Self Care  In-House Referral:  NA  Discharge planning Services  CM Consult  Post Acute Care Choice:  NA Choice offered to:  NA  DME Arranged:    DME Agency:     HH Arranged:    HH Agency:     Status of Service:  Completed, signed off  Medicare Important Message Given:  Yes Date Medicare IM Given:    Medicare IM give by:    Date Additional Medicare IM Given:    Additional Medicare Important Message give by:     If discussed at Long Length of Stay Meetings, dates discussed:    Additional Comments: Pt discharged home today. Pt does not qualify for home O2 at this time. Pt stated that he was going to stay in his truck for a few days because he would not get his SS check until the first of the month to go stay in a hotel. Pt has list of homeless shelters but is not interested at this time. Pt has neb machine for home use. Pt and pts nurse aware of discharge arrangements.  [name] [name], [name] Ruffino [name], RN 09/14/2015, 2:50 PM

## 2015-09-14 NOTE — Care Management Important Message (Signed)
Important Message  Patient Details  Name: [name] [name] MRN: [id number] Date of Birth: 04/24/1946   Medicare Important Message Given:  Yes    Cheryl FlashBlackwell, Jaleil Renwick Crowder, RN 09/14/2015, 2:32 PM

## 2015-09-14 NOTE — Care Management Note (Signed)
Case Management Note  Patient Details  Name: Jennings Booksndrew H Reineck MRN: [id number] Date of Birth: 01/14/1946  Subjective/Objective:                    Action/Plan:   Expected Discharge Date:                  Expected Discharge Plan:  Home/Self Care  In-House Referral:  NA  Discharge planning Services  CM Consult  Post Acute Care Choice:  NA Choice offered to:  NA  DME Arranged:    DME Agency:     HH Arranged:    HH Agency:     Status of Service:  Completed, signed off  Medicare Important Message Given:  Yes Date Medicare IM Given:    Medicare IM give by:    Date Additional Medicare IM Given:    Additional Medicare Important Message give by:     If discussed at Long Length of Stay Meetings, dates discussed:    Additional Comments: Pt has appealed discharge. Will await [facility] determination.  [name], Talea Manges [place], RN 09/14/2015, 2:59 PM

## 2015-09-15 LAB — GLUCOSE, CAPILLARY
Glucose-Capillary: 70 mg/dL (ref 65–99)
Glucose-Capillary: 112 mg/dL — ABNORMAL HIGH (ref 65–99)
Glucose-Capillary: 206 mg/dL — ABNORMAL HIGH (ref 65–99)
Glucose-Capillary: 153 mg/dL — ABNORMAL HIGH (ref 65–99)

## 2015-09-15 MED ORDER — IPRATROPIUM-ALBUTEROL 0.5-2.5 (3) MG/3ML IN SOLN
3.0000 mL | Freq: Three times a day (TID) | RESPIRATORY_TRACT | Status: DC
Start: 2015-09-15 — End: 2015-09-19
  Administered 2015-09-15 – 2015-09-19 (×11): 3 mL via RESPIRATORY_TRACT
  Filled 2015-09-15 (×11): qty 3

## 2015-09-15 NOTE — Progress Notes (Signed)
[facility] PROGRESS NOTE  [name] [name] [id number] DOB: 01/19/1946 DOA: 09/09/2015 PCP: Daryel NovemberMILLER,GARY, MD  Patient has appealed his discharge. Assessment/Plan: 1. COPD exacerbation. Improved with IV abx, IV steroids, bronchodilators, supplemental O2, and Mucinex.  Patient is breathing comfortably with a mild cough.  2. Acute hypoxic respiratory failure, resolved with supplemental O2. Patient breathing comfortably and able to ambulate on RA with stable vitals.  3. Tobacco abuse, counseled on cessation. 4. Chronic systolic congestive heart failure with evidence of mild acute decompensation. He reported noncompliance with ACE-I and BB. CXR unremarkable and BNP 125 on admission. Resolved with PO Lasix. 5. Steroid induced hyperglycemia. Stable. Anticipate resolution with discontinued use of steroids.  6. Thrombocytopenia. B12 wnl. Stable.  7. Bipolar disorder, he has refused medications. 8. Abnormal thyroid function tests, TSH 0.73 and free T4 1.32.  Free T3 2.7. Rrecommended outpatient follow up.   Code Status: Full DVT prophylaxis: SCDs Family Communication: Discussed with patient who understands and has no concerns at this time. Disposition Plan: Patient was felt to be stable for discharge on 11/17 He has elected to appeal his discharge. Currently we are awaiting a final decision on his discharge status. At this time he appears stable for discharge.    Consultants:  None  Procedures:  None  Antibiotics:  Doxycycline 11/12>>11/17  HPI/Subjective: Feels better. Still has a mild cough but has been ambulating.   Objective: Filed Vitals:   09/15/15 1052  BP:   Pulse: 100  Temp:   Resp:     Intake/Output Summary (Last 24 hours) at 09/15/15 1101 Last data filed at 09/15/15 0700  Gross per 24 hour  Intake   1080 ml  Output      0 ml  Net   1080 ml   Filed Weights   09/09/15 1406  Weight: 76.204 kg (168 lb)    Exam:  General: NAD, looks  comfortable Cardiovascular: RRR, S1, S2  Respiratory: Fair air movement bilaterally. Diminished breath sounds bilaterally. No wheezing Abdomen: soft, non tender, no distention , bowel sounds normal   Data Reviewed: Basic Metabolic Panel:  Recent Labs Lab 09/09/15 1429 09/10/15 0623 09/11/15 0627 09/12/15 0648  NA 138 138 140 143  K 3.5 4.3 4.3 4.2  CL 101 98* 98* 102  CO2 30 32 37* 36*  GLUCOSE 75 225* 197* 179*  BUN 15 20 19  21*  CREATININE 0.71 0.78 0.71 0.62  CALCIUM 8.6* 8.5* 8.7* 8.8*   Liver Function Tests:  Recent Labs Lab 09/09/15 1429  AST 23  ALT 35  ALKPHOS 55  BILITOT 0.9  PROT 5.9*  ALBUMIN 3.2*    CBC:  Recent Labs Lab 09/09/15 1429 09/10/15 0623 09/11/15 0627 09/12/15 0648  WBC 14.3* 10.4 17.5* 15.6*  NEUTROABS 11.7*  --  16.1*  --   HGB 12.1* 12.3* 11.7* 11.3*  HCT 37.6* 38.6* 37.1* 34.9*  MCV 99.7 100.8* 100.3* 100.6*  PLT 132* 143* 162 148*   Cardiac Enzymes:  Recent Labs Lab 09/09/15 1429  TROPONINI <0.03   BNP (last 3 results)  Recent Labs  03/06/15 1657 08/19/15 1550 09/09/15 1429  BNP 92.0 100.0 124.0*    ProBNP (last 3 results)  Recent Labs  09/24/14 0704  PROBNP 798.5*    CBG:  Recent Labs Lab 09/14/15 0725 09/14/15 1112 09/14/15 1638 09/14/15 2029 09/15/15 0723  GLUCAP 116* 138* 316* 85 70       Studies: No results found.  Scheduled Meds: . aspirin EC  81 mg Oral Daily  .  budesonide-formoterol  2 puff Inhalation BID  . docusate sodium  100 mg Oral Daily  . doxycycline  100 mg Oral Q12H  . furosemide  40 mg Oral Daily  . guaiFENesin  1,200 mg Oral BID  . insulin aspart  0-20 Units Subcutaneous TID WC  . insulin aspart  0-5 Units Subcutaneous QHS  . insulin glargine  15 Units Subcutaneous QHS  . ipratropium-albuterol  3 mL Nebulization Q4H  . methylPREDNISolone (SOLU-MEDROL) injection  60 mg Intravenous Q24H  . potassium chloride SA  20 mEq Oral Daily  . tamsulosin  0.4 mg Oral QHS    Continuous Infusions:   Principal Problem:   COPD exacerbation (HCC) Active Problems:   H/O aortic valve replacement   History of permanent cardiac pacemaker placement   Bipolar 1 disorder (HCC)   Tobacco abuse   Hyperglycemia, drug-induced   Chronic systolic CHF (congestive heart failure) (HCC)   Acute respiratory failure with hypoxia (HCC)   Thrombocytopenia (HCC)   Abnormal thyroid blood test    Time spent: 20 minutes  Jehanzeb Memon. MD  [facility] Pager [phone]. If 7PM-7AM, please contact night-coverage at [email or web address], password [email or web address] 09/15/2015, 11:01 AM  LOS: 5 days     By signing my name below, I, Burnett Harry, attest that this documentation has been prepared under the direction and in the presence of [employer]. MD Electronically Signed: Burnett Harry, Scribe. 09/15/2015 12:46pm   I, Dr. Erick Blinks, personally performed the services described in this documentaiton. All medical record entries made by the scribe were at my direction and in my presence. I have reviewed the chart and agree that the record reflects my personal performance and is accurate and complete  Erick Blinks, MD, 09/15/2015 1:00 PM    .

## 2015-09-16 LAB — GLUCOSE, CAPILLARY
Glucose-Capillary: 66 mg/dL (ref 65–99)
Glucose-Capillary: 136 mg/dL — ABNORMAL HIGH (ref 65–99)
Glucose-Capillary: 125 mg/dL — ABNORMAL HIGH (ref 65–99)
Glucose-Capillary: 144 mg/dL — ABNORMAL HIGH (ref 65–99)
Glucose-Capillary: 261 mg/dL — ABNORMAL HIGH (ref 65–99)

## 2015-09-16 MED ORDER — INSULIN ASPART 100 UNIT/ML ~~LOC~~ SOLN
0.0000 [IU] | Freq: Three times a day (TID) | SUBCUTANEOUS | Status: DC
  Administered 2015-09-16: 1 [IU] via SUBCUTANEOUS
  Administered 2015-09-17: 3 [IU] via SUBCUTANEOUS
  Administered 2015-09-18: 1 [IU] via SUBCUTANEOUS
  Administered 2015-09-18: 3 [IU] via SUBCUTANEOUS

## 2015-09-16 NOTE — Progress Notes (Signed)
Patient CBG 125 before lunch. Currently on resistant sliding scale insulin. Notified Dr Kerry HoughMemon, since patient had hypoglycemia this morning and pt states he has frequent hypoglycemia. Order received to switch to sensitive scale and hold insulin for lunch. Reassess with afternoon CBG. Earnstine RegalAshley Katisha Shimizu, RN

## 2015-09-16 NOTE — Progress Notes (Signed)
Hypoglycemic Event  CBG: 66  Treatment: 15 GM carbohydrate snack  Symptoms: None  Follow-up CBG: Time:0850 CBG Result: 136  Possible Reasons for Event: Unknown  Comments/MD notified: Text-paged Dr Kerry HoughMemon to notify. Patient ate breakfast and recheck CBG: 136. Denied any symptoms of hypoglycemia. Pt stated "I've had hypoglycemia since my 30s and manage with 6 small meals a day".    Riccardo DubinMcCann, Nykira Reddix Joy

## 2015-09-16 NOTE — Progress Notes (Signed)
[facility] PROGRESS NOTE  [name] [name] WUJ:[id number] DOB: 04-Feb-1946 DOA: 09/09/2015 PCP: Daryel November, MD  Patient has appealed his discharge. Assessment/Plan: 1. COPD exacerbation. Improved with IV abx, IV steroids, bronchodilators, supplemental O2, and Mucinex.  Patient is breathing comfortably with a mild cough.  2. Acute hypoxic respiratory failure, resolved with supplemental O2. Patient had supplemental O2 reapplied last night however there is no documented hypoxia. We have asked staff to remove O2 and recheck O2 sats. Oxygen should not be reapplied unless sats are <88%. Clinically the patient does not appear dyspneic.  3. Tobacco abuse, counseled on cessation. 4. Chronic systolic congestive heart failure with evidence of mild acute decompensation. He reported noncompliance with ACE-I and BB. CXR unremarkable and BNP 125 on admission. Resolved with PO Lasix. 5. Steroid induced hyperglycemia. Stable. Anticipate resolution with discontinued use of steroids.  6. Thrombocytopenia. B12 wnl. Stable.  7. Bipolar disorder, he has refused medications. 8. Abnormal thyroid function tests, TSH 0.73 and free T4 1.32.  Free T3 2.7. Rrecommended outpatient follow up.   Code Status: Full DVT prophylaxis: SCDs Family Communication: Discussed with patient who understands and has no concerns at this time. Disposition Plan: Patient was felt to be stable for discharge on 11/17 He has elected to appeal his discharge. Currently we are awaiting a final decision on his discharge status. At this time he appears stable for discharge.    Consultants:  None  Procedures:  None  Antibiotics:  Doxycycline 11/12>>11/17  HPI/Subjective: Feels okay. Still has some SOB but his cough is improved.   Objective: Filed Vitals:   09/16/15 0619  BP: 103/73  Pulse: 89  Temp: 97.4 F (36.3 C)  Resp: 18    Intake/Output Summary (Last 24 hours) at 09/16/15 0737 Last data filed at 09/15/15 1700   Gross per 24 hour  Intake    240 ml  Output      0 ml  Net    240 ml   Filed Weights   09/09/15 1406  Weight: 76.204 kg (168 lb)    Exam:  General: NAD, looks comfortable Cardiovascular: RRR, S1, S2  Respiratory: Diminished breath sounds but fair air entry bilaterally. No wheezing.  Abdomen: soft, non tender, no distention , bowel sounds normal   Data Reviewed: Basic Metabolic Panel:  Recent Labs Lab 09/09/15 1429 09/10/15 0623 09/11/15 0627 09/12/15 0648  NA 138 138 140 143  K 3.5 4.3 4.3 4.2  CL 101 98* 98* 102  CO2 30 32 37* 36*  GLUCOSE 75 225* 197* 179*  BUN 21*  CREATININE 0.71 0.78 0.71 0.62  CALCIUM 8.6* 8.5* 8.7* 8.8*   Liver Function Tests:  Recent Labs Lab 09/09/15 1429  AST 23  ALT 35  ALKPHOS 55  BILITOT 0.9  PROT 5.9*  ALBUMIN 3.2*    CBC:  Recent Labs Lab 09/09/15 1429 09/10/15 0623 09/11/15 0627 09/12/15 0648  WBC 14.3* 10.4 17.5* 15.6*  NEUTROABS 11.7*  --  16.1*  --   HGB 12.1* 12.3* 11.7* 11.3*  HCT 37.6* 38.6* 37.1* 34.9*  MCV 99.7 100.8* 100.3* 100.6*  PLT 132* 143* 162 148*   Cardiac Enzymes:  Recent Labs Lab 09/09/15 1429  TROPONINI <0.03   BNP (last 3 results)  Recent Labs  03/06/15 1657 08/19/15 1550 09/09/15 1429  BNP 92.0 100.0 124.0*    ProBNP (last 3 results)  Recent Labs  09/24/14 0704  PROBNP 798.5*    CBG:  Recent Labs Lab 09/14/15 2029 09/15/15 0723  09/15/15 1112 09/15/15 1609 09/15/15 2106  GLUCAP 85 70 112* 206* 153*    Scheduled Meds: . aspirin EC  81 mg Oral Daily  . budesonide-formoterol  2 puff Inhalation BID  . docusate sodium  100 mg Oral Daily  . doxycycline  100 mg Oral Q12H  . furosemide  40 mg Oral Daily  . guaiFENesin  1,200 mg Oral BID  . insulin aspart  0-20 Units Subcutaneous TID WC  . insulin aspart  0-5 Units Subcutaneous QHS  . insulin glargine  15 Units Subcutaneous QHS  . ipratropium-albuterol  3 mL Nebulization TID  . methylPREDNISolone  (SOLU-MEDROL) injection  60 mg Intravenous Q24H  . potassium chloride SA  20 mEq Oral Daily  . tamsulosin  0.4 mg Oral QHS   Continuous Infusions:   Principal Problem:   COPD exacerbation (HCC) Active Problems:   H/O aortic valve replacement   History of permanent cardiac pacemaker placement   Bipolar 1 disorder (HCC)   Tobacco abuse   Hyperglycemia, drug-induced   Chronic systolic CHF (congestive heart failure) (HCC)   Acute respiratory failure with hypoxia (HCC)   Thrombocytopenia (HCC)   Abnormal thyroid blood test    Time spent: 15 minutes  Adeena Bernabe. MD  [facility] Pager [phone]. If 7PM-7AM, please contact night-coverage at [email or web address], password [email or web address] 09/16/2015, 7:37 AM  LOS: 6 days     By signing my name below, I, Burnett HarryJennifer Gregorio, attest that this documentation has been prepared under the direction and in the presence of [facility]Er[facility]hanzeb Xianna Siverling. MD Electronically Signed: Burnett HarryJennifer Gregorio, Scribe. 09/16/2015 1:07pm  I, Dr. Erick BlinksJehanzeb Yazmeen Woolf, personally performed the services described in this documentaiton. All medical record entries made by the scribe were at my direction and in my presence. I have reviewed the chart and agree that the record reflects my personal performance and is accurate and complete  Erick BlinksJehanzeb Maryem Shuffler, MD, 09/16/2015 3:37 PM    .

## 2015-09-16 NOTE — Progress Notes (Signed)
CM contacted [facility] regarding status of appeal.  Spoke with Representative Chrissie NoaWilliam and informed CM that appeal was denied. Case was reviewed and physician agreement with discharge.   Patient able to file second level for appeal. The number to call for this will be [phone].  CM contacted staff on unit regarding appeal.

## 2015-09-17 LAB — GLUCOSE, CAPILLARY
Glucose-Capillary: 70 mg/dL (ref 65–99)
Glucose-Capillary: 106 mg/dL — ABNORMAL HIGH (ref 65–99)
Glucose-Capillary: 213 mg/dL — ABNORMAL HIGH (ref 65–99)
Glucose-Capillary: 266 mg/dL — ABNORMAL HIGH (ref 65–99)

## 2015-09-17 MED ORDER — PREDNISONE 20 MG PO TABS
40.0000 mg | ORAL_TABLET | Freq: Every day | ORAL | Status: DC
  Administered 2015-09-18: 40 mg via ORAL
  Filled 2015-09-17: qty 2

## 2015-09-17 NOTE — Progress Notes (Signed)
[facility] PROGRESS NOTE  [name] [name] ZOX:[id number] DOB: 07/06/1946 DOA: 09/09/2015 PCP: Daryel November, MD  Patient has appealed his discharge. Assessment/Plan: 1. COPD exacerbation. Improved with abx, IV steroids, bronchodilators, supplemental O2, and Mucinex. Patient is breathing and talking comfortably on room air. Will place the patient on Prednisone and discontinue abx since he has completed his course.  2. Acute hypoxic respiratory failure, related to #1. Now resolved. Breathing comfortably on room air.  3. Tobacco abuse, counseled on cessation. 4. Chronic systolic congestive heart failure with evidence of mild acute decompensation. He reported noncompliance with ACE-I and BB. CXR unremarkable and BNP 125 on admission. Resolved with PO Lasix. 5. Steroid induced hyperglycemia. Stable. Anticipate resolution with discontinued use of steroids.  6. Thrombocytopenia. B12 wnl. Stable.  7. Bipolar disorder, he has refused medications. 8. Abnormal thyroid function tests, TSH 0.73 and free T4 1.32.  Free  T3 2.7. Rrecommended outpatient follow up.   Code Status: Full DVT prophylaxis: SCDs Family Communication: Discussed with patient who understands and has no concerns at this time. Disposition Plan: Patient was felt to be stable for discharge on 11/17 He elected to appeal his discharge and appeal was reportedly denied. He has now elected to pursue a second appeal of his discharge Currently we are awaiting a final decision on his discharge status. In my opinion he has been stable for discharge since 11/17.    Consultants:  None  Procedures:  None  Antibiotics:  Doxycycline 11/12>>11/17  HPI/Subjective: He reports breathing is improving. He is able to ambulate. Wheezing has improving. He has a mild cough.   Objective: Filed Vitals:   09/17/15 0642  BP: 99/69  Pulse: 77  Temp: 98.2 F (36.8 C)  Resp: 18    Intake/Output Summary (Last 24 hours) at 09/17/15 8119 Last  data filed at 09/16/15 1730  Gross per 24 hour  Intake    480 ml  Output      0 ml  Net    480 ml   Filed Weights   09/09/15 1406  Weight: 76.204 kg (168 lb)    Exam:  General: NAD, looks comfortable Cardiovascular: RRR, S1, S2  Respiratory:  No wheezing. Able to speak in full sentences.  Abdomen: soft, non tender, no distention , bowel sounds normal   Data Reviewed: Basic Metabolic Panel:  Recent Labs Lab 09/11/15 0627 09/12/15 0648  NA 140 143  K 4.3 4.2  CL 98* 102  CO2 37* 36*  GLUCOSE 197* 179*  BUN 19 21*  CREATININE 0.71 0.62  CALCIUM 8.7* 8.8*   CBC:  Recent Labs Lab 09/11/15 0627 09/12/15 0648  WBC 17.5* 15.6*  NEUTROABS 16.1*  --   HGB 11.7* 11.3*  HCT 37.1* 34.9*  MCV 100.3* 100.6*  PLT 162 148*   BNP (last 3 results)  Recent Labs  03/06/15 1657 08/19/15 1550 09/09/15 1429  BNP 92.0 100.0 124.0*    ProBNP (last 3 results)  Recent Labs  09/24/14 0704  PROBNP 798.5*    CBG:  Recent Labs Lab 09/16/15 0755 09/16/15 0858 09/16/15 1138 09/16/15 1702 09/16/15 2134  GLUCAP 66 136* 125* 144* 261*    Scheduled Meds: . aspirin EC  81 mg Oral Daily  . budesonide-formoterol  2 puff Inhalation BID  . docusate sodium  100 mg Oral Daily  . doxycycline  100 mg Oral Q12H  . furosemide  40 mg Oral Daily  . guaiFENesin  1,200 mg Oral BID  . insulin aspart  0-5 Units Subcutaneous  QHS  . insulin aspart  0-9 Units Subcutaneous TID WC  . insulin glargine  15 Units Subcutaneous QHS  . ipratropium-albuterol  3 mL Nebulization TID  . methylPREDNISolone (SOLU-MEDROL) injection  60 mg Intravenous Q24H  . potassium chloride SA  20 mEq Oral Daily  . tamsulosin  0.4 mg Oral QHS   Continuous Infusions:   Principal Problem:   COPD exacerbation (HCC) Active Problems:   H/O aortic valve replacement   History of permanent cardiac pacemaker placement   Bipolar 1 disorder (HCC)   Tobacco abuse   Hyperglycemia, drug-induced   Chronic systolic  CHF (congestive heart failure) (HCC)   Acute respiratory failure with hypoxia (HCC)   Thrombocytopenia (HCC)   Abnormal thyroid blood test    Time spent: 20 minutes  Jehanzeb Memon. MD  [facility] Pager [phone]. If 7PM-7AM, please contact night-coverage at [email or web address], password [email or web address] 09/17/2015, 7:22 AM  LOS: 7 days     By signing my name below, I, Zadie CleverlyJessica Augustus, attest that this documentation has been prepared under the direction and in the presence of Erick BlinksJehanzeb Memon, MD. Electronically signed: Zadie CleverlyJessica Augustus, Scribe. 09/17/2015 1:54pm.   I, Dr. Erick BlinksJehanzeb Memon, personally performed the services described in this documentaiton. All medical record entries made by the scribe were at my direction and in my presence. I have reviewed the chart and agree that the record reflects my personal performance and is accurate and complete  Erick BlinksJehanzeb Memon, MD, 09/17/2015 2:08PM    .

## 2015-09-18 LAB — GLUCOSE, CAPILLARY
Glucose-Capillary: 69 mg/dL (ref 65–99)
Glucose-Capillary: 131 mg/dL — ABNORMAL HIGH (ref 65–99)
Glucose-Capillary: 239 mg/dL — ABNORMAL HIGH (ref 65–99)
Glucose-Capillary: 116 mg/dL — ABNORMAL HIGH (ref 65–99)

## 2015-09-18 MED ORDER — PREDNISONE 20 MG PO TABS
30.0000 mg | ORAL_TABLET | Freq: Every day | ORAL | Status: DC
  Administered 2015-09-19: 30 mg via ORAL
  Filled 2015-09-18: qty 1

## 2015-09-18 MED ORDER — INSULIN GLARGINE 100 UNIT/ML ~~LOC~~ SOLN: 10.0000 [IU] | Freq: Every day | SUBCUTANEOUS | Status: DC

## 2015-09-18 NOTE — Clinical Social Work Note (Signed)
CSW faxed letter to pt's lawyer at his request for court today. Pt provided fax number. Pt aware of information provided in letter and agreeable. Letter and fax confirmation given to pt at his request.  Derenda FennelKara Luz Mares, LCSW [phone]

## 2015-09-18 NOTE — Progress Notes (Signed)
Inpatient Diabetes Program Recommendations  AACE/ADA: New Consensus Statement on Inpatient Glycemic Control (2015)  Target Ranges:  Prepandial:   less than 140 mg/dL      Peak postprandial:   less than 180 mg/dL (1-2 hours)      Critically ill patients:  140 - 180 mg/dL  Results for Jennings BooksBOSWELL, Masao H (MRN [id number]) as of 09/18/2015 08:29  Ref. Range 09/17/2015 08:06 09/17/2015 11:39 09/17/2015 16:59 09/17/2015 20:01 09/18/2015 07:33  Glucose-Capillary Latest Ref Range: 65-99 mg/dL 70 401106 (H) 027213 (H) 253266 (H) 69   Review of Glycemic Control  Diabetes history: No Outpatient Diabetes medications: NA Current orders for Inpatient glycemic control: Lantus 15 units daily, Novlog 0-9 units TID with meals, Novolog 0-5 units HS  Inpatient Diabetes Program Recommendations: Insulin - Basal: Noted steroids have been decreased, fasting glucose was 70 mg/dl on 66/4411/20 and 69 mg/dl this morning. Please consider decreasing Lantus to 10 units QHS. Insulin - Meal Coverage: Post prandial glucose is consistently elevated due to steroids. While inpatient and ordered steroids, please consider ordering Novolog 3 units TID with meals for meal coveage.  Thanks, Orlando PennerMarie Yitzel Shasteen, RN, MSN, CDE Diabetes Coordinator Inpatient Diabetes Program [phone] (Team Pager from 8am to 5pm) [phone] ([facility] office) [phone] [facility] office) [phone] [facility] office)

## 2015-09-18 NOTE — Progress Notes (Signed)
[facility] PROGRESS NOTE  [name] [name] [id number] DOB: 03/17/1946 DOA: 09/09/2015 PCP: Daryel NovemberMILLER,GARY, MD  Patient has appealed his discharge. Assessment/Plan: 1. COPD exacerbation. Resolved with abx, IV steroids, bronchodilators, supplemental O2, and Mucinex. Patient is breathing and talking comfortably on room air. Continue prednisone taper. No evidence of wheezing.   2. Acute hypoxic respiratory failure, related to #1. Now resolved. Breathing comfortably on room air.  3. Tobacco abuse, counseled on cessation. 4. Chronic systolic congestive heart failure with evidence of mild acute decompensation. He reported noncompliance with ACE-I and BB. CXR unremarkable and BNP 125 on admission. Resolved with PO Lasix. 5. Steroid induced hyperglycemia. Stable. Anticipate resolution with discontinued use of steroids.  6. Thrombocytopenia. B12 wnl. Stable.  7. Bipolar disorder, he has refused medications. 8. Abnormal thyroid function tests, TSH 0.73 and free T4 1.32.  Free  T3 2.7. Rrecommended outpatient follow up.   Code Status: Full DVT prophylaxis: SCDs Family Communication: Discussed with patient who understands and has no concerns at this time. Disposition Plan: Patient was felt to be stable for discharge on 11/17 He elected to appeal his discharge and appeal was reportedly denied. He has now elected to pursue a second appeal of his discharge Currently we are awaiting a final decision on his discharge status. In my opinion he has been stable for discharge since 11/17.    Consultants:  None  Procedures:  None  Antibiotics:  Doxycycline 11/12>>11/17  HPI/Subjective: Feels better then yesterday. Breathing is improving.   Objective: Filed Vitals:   09/17/15 1959 09/18/15 0400  BP: 100/60 127/73  Pulse: 76 83  Temp: 97.6 F (36.4 C) 98.1 F (36.7 C)  Resp: 20 18    Intake/Output Summary (Last 24 hours) at 09/18/15 0802 Last data filed at 09/17/15 1502  Gross per 24  hour  Intake    720 ml  Output      0 ml  Net    720 ml   Filed Weights   09/09/15 1406  Weight: 76.204 kg (168 lb)    Exam:  General:  Appears comfortable, calm  Cardiovascular: Regular rate and rhythm, no murmur, rub or gallop. No lower extremity edema.  Respiratory: Clear to auscultation bilaterally, no wheezes, rales or rhonchi. Normal respiratory effort.  Abdomen: soft, ntnd  Musculoskeletal: grossly normal tone bilateral upper and lower extremities.   Data Reviewed: Basic Metabolic Panel:  Recent Labs Lab 09/12/15 0648  NA 143  K 4.2  CL 102  CO2 36*  GLUCOSE 179*  BUN 21*  CREATININE 0.62  CALCIUM 8.8*   CBC:  Recent Labs Lab 09/12/15 0648  WBC 15.6*  HGB 11.3*  HCT 34.9*  MCV 100.6*  PLT 148*   BNP (last 3 results)  Recent Labs  03/06/15 1657 08/19/15 1550 09/09/15 1429  BNP 92.0 100.0 124.0*    ProBNP (last 3 results)  Recent Labs  09/24/14 0704  PROBNP 798.5*    CBG:  Recent Labs Lab 09/17/15 0806 09/17/15 1139 09/17/15 1659 09/17/15 2001 09/18/15 0733  GLUCAP 70 106* 213* 266* 69    Scheduled Meds: . aspirin EC  81 mg Oral Daily  . budesonide-formoterol  2 puff Inhalation BID  . docusate sodium  100 mg Oral Daily  . furosemide  40 mg Oral Daily  . guaiFENesin  1,200 mg Oral BID  . insulin aspart  0-5 Units Subcutaneous QHS  . insulin aspart  0-9 Units Subcutaneous TID WC  . insulin glargine  15 Units Subcutaneous QHS  .  ipratropium-albuterol  3 mL Nebulization TID  . potassium chloride SA  20 mEq Oral Daily  . predniSONE  40 mg Oral Q breakfast  . tamsulosin  0.4 mg Oral QHS   Continuous Infusions:   Principal Problem:   COPD exacerbation (HCC) Active Problems:   H/O aortic valve replacement   History of permanent cardiac pacemaker placement   Bipolar 1 disorder (HCC)   Tobacco abuse   Hyperglycemia, drug-induced   Chronic systolic CHF (congestive heart failure) (HCC)   Acute respiratory failure with  hypoxia (HCC)   Thrombocytopenia (HCC)   Abnormal thyroid blood test    Time spent: 15 minutes  [name] [name]. MD  [facility] Pager [phone]. If 7PM-7AM, please contact night-coverage at [email or web address], password [facility] 09/18/2015, 8:02 AM  LOS: 8 days     By signing my name below, I, Zadie Cleverly, attest that this documentation has been prepared under the direction and in the presence of Erick Blinks, MD. Electronically signed: Zadie Cleverly, Scribe. 09/18/2015 12:31pm  I, Dr. Erick Blinks, personally performed the services described in this documentaiton. All medical record entries made by the scribe were at my direction and in my presence. I have reviewed the chart and agree that the record reflects my personal performance and is accurate and complete  Erick Blinks, MD, 09/18/2015 12:57PM    .

## 2015-09-19 LAB — GLUCOSE, CAPILLARY
Glucose-Capillary: 70 mg/dL (ref 65–99)
Glucose-Capillary: 94 mg/dL (ref 65–99)

## 2015-09-19 NOTE — Progress Notes (Signed)
Pt discharged via wheelchair in stable condition via cab service.  Pt was given a voucher for cab service.  Pt denies pain at this time.  VS: WNL.  Security met pt at ED entrance to return belongings to pt.  Discharge instructions were reviewed with pt w/o any questions presented.  Prescriptions were given to pt.  Respiratory meds were given to pt.

## 2015-09-19 NOTE — Discharge Summary (Signed)
Physician Discharge Summary  [name] [name] ZOX:[id number] DOB: 09/09/1946 DOA: 09/09/2015  PCP: Daryel NovemberMILLER,GARY, MD  Admit date: 09/09/2015 Discharge date: 09/19/2015  Time spent: 15 minutes  Discharge Diagnoses:  Principal Problem:   COPD exacerbation (HCC) Active Problems:   H/O aortic valve replacement   History of permanent cardiac pacemaker placement   Bipolar 1 disorder (HCC)   Tobacco abuse   Hyperglycemia, drug-induced   Chronic systolic CHF (congestive heart failure) (HCC)   Acute respiratory failure with hypoxia (HCC)   Thrombocytopenia (HCC)   Abnormal thyroid blood test   Hospital Course:  This is an addendum to the discharge summary done on 09/14/15. Since the time of the previous discharge summary patient's overall condition has remained stable. He was felt to discharge on 11/17 by like to appeal his discharge. His first appeal was denied and therefore he elected to pursue a second appeal. On 11/22 he was informed that a second opinion was denied. He did not appear to be significantly short of breath, vitals are stable. Overall discharge recommendations and medication list is unchanged. Please refer to previous discharged on her further details.     Discharge Exam: Filed Vitals:   09/18/15 2210 09/19/15 0558  BP: 122/68 102/71  Pulse: 74 79  Temp: 98.1 F (36.7 C) 98.6 F (37 C)  Resp: 18 18    General: No acute distress Cardiovascular: S1, S2, regular rate and rhythm Respiratory: Clear to auscultation bilaterally  Discharge Instructions   Discharge Instructions    Diet - low sodium heart healthy    Complete by:  As directed      Diet - low sodium heart healthy    Complete by:  As directed      Increase activity slowly    Complete by:  As directed      Increase activity slowly    Complete by:  As directed           Discharge Medication List as of 09/19/2015  1:13 PM    START taking these medications   Details  guaiFENesin (MUCINEX) 600 MG 12  hr tablet Take 2 tablets (1,200 mg total) by mouth 2 (two) times daily., Starting 09/14/2015, Until Discontinued, Print      CONTINUE these medications which have CHANGED   Details  ipratropium-albuterol (DUONEB) 0.5-2.5 (3) MG/3ML SOLN Inhale 3 mLs into the lungs every 4 (four) hours as needed (for shortness of breath)., Starting 09/14/2015, Until Discontinued, Print    predniSONE (DELTASONE) 10 MG tablet Take 40mg  po daily for 2 days then 30mg  daily for 2 days then 20mg  daily for 2 days then 10mg  daily for 2 days then stop, Print      CONTINUE these medications which have NOT CHANGED   Details  albuterol (PROVENTIL HFA;VENTOLIN HFA) 108 (90 BASE) MCG/ACT inhaler Inhale 2 puffs into the lungs every 6 (six) hours as needed for wheezing or shortness of breath., Starting 08/22/2015, Until Discontinued, Print    aspirin EC 81 MG tablet Take 81 mg by mouth daily., Until Discontinued, Historical Med    docusate sodium (COLACE) 100 MG capsule Take 100 mg by mouth daily., Until Discontinued, Historical Med    folic acid (FOLVITE) 1 MG tablet Take 1 tablet (1 mg total) by mouth daily., Starting 10/04/2014, Until Discontinued, Normal    furosemide (LASIX) 40 MG tablet Take 1 tablet (40 mg total) by mouth daily., Starting 01/05/2015, Until Discontinued, Print    potassium chloride SA (K-DUR,KLOR-CON) 20 MEQ tablet Take 20 mEq  by mouth daily., Until Discontinued, Historical Med    tamsulosin (FLOMAX) 0.4 MG CAPS capsule Take 0.4 mg by mouth at bedtime., Until Discontinued, Historical Med    vardenafil (LEVITRA) 10 MG tablet Take 10 mg by mouth as needed for erectile dysfunction., Until Discontinued, Historical Med    SYMBICORT 160-4.5 MCG/ACT inhaler INHALE TWO PUFFS BY MOUTH TWICE DAILY, Historical Med      STOP taking these medications     azithromycin (ZITHROMAX) 250 MG tablet        Allergies  Allergen Reactions  . Tiotropium Bromide Monohydrate Nausea And Vomiting, Other (See Comments)  and Nausea Only    Tremors Reaction:  Tremors  . Morphine And Related Other (See Comments)    Reaction:  Hallucinations       The results of significant diagnostics from this hospitalization (including imaging, microbiology, ancillary and laboratory) are listed below for reference.    Significant Diagnostic Studies: Dg Chest 2 View  09/09/2015  CLINICAL DATA:  Shortness of breath. Heart surgery in 2015. Pacemaker placed in 2007. EXAM: CHEST  2 VIEW COMPARISON:  Chest x-ray dated 08/22/2015. FINDINGS: Heart size is within normal limits and unchanged. Overall cardiomediastinal silhouette is stable in size and configuration. Median sternotomy wires appear intact and stable in alignment. Valve replacement hardware appears stable in position. Left chest wall pacemaker/ AICD appears stable in position. Lungs are hyperexpanded consistent with COPD. Lungs are clear. No evidence of pneumonia. No pleural effusions seen. No pneumothorax. Mild degenerative change again noted within the thoracic spine. No acute osseous abnormality. Surgical clips present over the right upper chest wall. IMPRESSION: 1. Hyperexpanded lungs consistent with COPD. 2. Surgical changes as described above. 3. Stable chest x-ray. No evidence of acute cardiopulmonary abnormality. Lungs are clear. Electronically Signed   By: Bary Richard M.D.   On: 09/09/2015 14:57    Microbiology: No results found for this or any previous visit (from the past 240 hour(s)).   Labs: Basic Metabolic Panel: No results for input(s): NA, K, CL, CO2, GLUCOSE, BUN, CREATININE, CALCIUM, MG, PHOS in the last 168 hours. Liver Function Tests: No results for input(s): AST, ALT, ALKPHOS, BILITOT, PROT, ALBUMIN in the last 168 hours. No results for input(s): LIPASE, AMYLASE in the last 168 hours. No results for input(s): AMMONIA in the last 168 hours. CBC: No results for input(s): WBC, NEUTROABS, HGB, HCT, MCV, PLT in the last 168 hours. Cardiac  Enzymes: No results for input(s): CKTOTAL, CKMB, CKMBINDEX, TROPONINI in the last 168 hours. BNP: BNP (last 3 results)  Recent Labs  03/06/15 1657 08/19/15 1550 09/09/15 1429  BNP 92.0 100.0 124.0*    ProBNP (last 3 results)  Recent Labs  09/24/14 0704  PROBNP 798.5*    CBG:  Recent Labs Lab 09/18/15 1123 09/18/15 1620 09/18/15 2053 09/19/15 0741 09/19/15 1116  GLUCAP 131* 239* 116* 70 94       Signed:  Tallia Moehring  [facility] 09/19/2015, 6:05 PM

## 2015-09-19 NOTE — Care Management Note (Signed)
Case Management Note  Patient Details  Name: [name] [name] MRN: [id number] Date of Birth: 12/03/1945  Subjective/Objective:                    Action/Plan:   Expected Discharge Date:                  Expected Discharge Plan:  Home/Self Care  In-House Referral:  NA  Discharge planning Services  CM Consult  Post Acute Care Choice:  NA Choice offered to:  NA  DME Arranged:    DME Agency:     HH Arranged:    HH Agency:     Status of Service:  Completed, signed off  Medicare Important Message Given:  Yes Date Medicare IM Given:    Medicare IM give by:    Date Additional Medicare IM Given:    Additional Medicare Important Message give by:     If discussed at Long Length of Stay Meetings, dates discussed:    Additional Comments: Per pt [name] has called and 2nd appeal has been denied. Pt is aware that he is discharged. CSW to assist with transportation issues. No further CM needs at this time. [name], [name], RN 09/19/2015, 12:41 PM

## 2015-09-19 NOTE — Plan of Care (Signed)
Problem: Pain Managment: Goal: General experience of comfort will improve Outcome: Progressing Pt denies pain.   Problem: Physical Regulation: Goal: Ability to maintain clinical measurements within normal limits will improve Outcome: Progressing See flowsheet.

## 2015-09-19 NOTE — Clinical Social Work Note (Signed)
Pt reports he has no ride and no money for [place] Taxi voucher approved by Chiropodistassistant director on floor.   Derenda FennelKara Ameera Tigue, LCSW [phone]

## 2016-08-15 IMAGING — CR DG CHEST 1V PORT
1 series · 1 of 1 positions shown · non-contrast
Comparison: None.

CLINICAL DATA: Pt complained of CP x 3 hrs and SOB upon arrival to
ED tonight. Pt is uncooperative and aggressive. Pt to be
involuntarily committed. Per Pts chart - aortic valve replacement,
COPD, pacemaker, hiatal hernia repair, former smoker

EXAM:
PORTABLE CHEST - 1 VIEW

[portable]
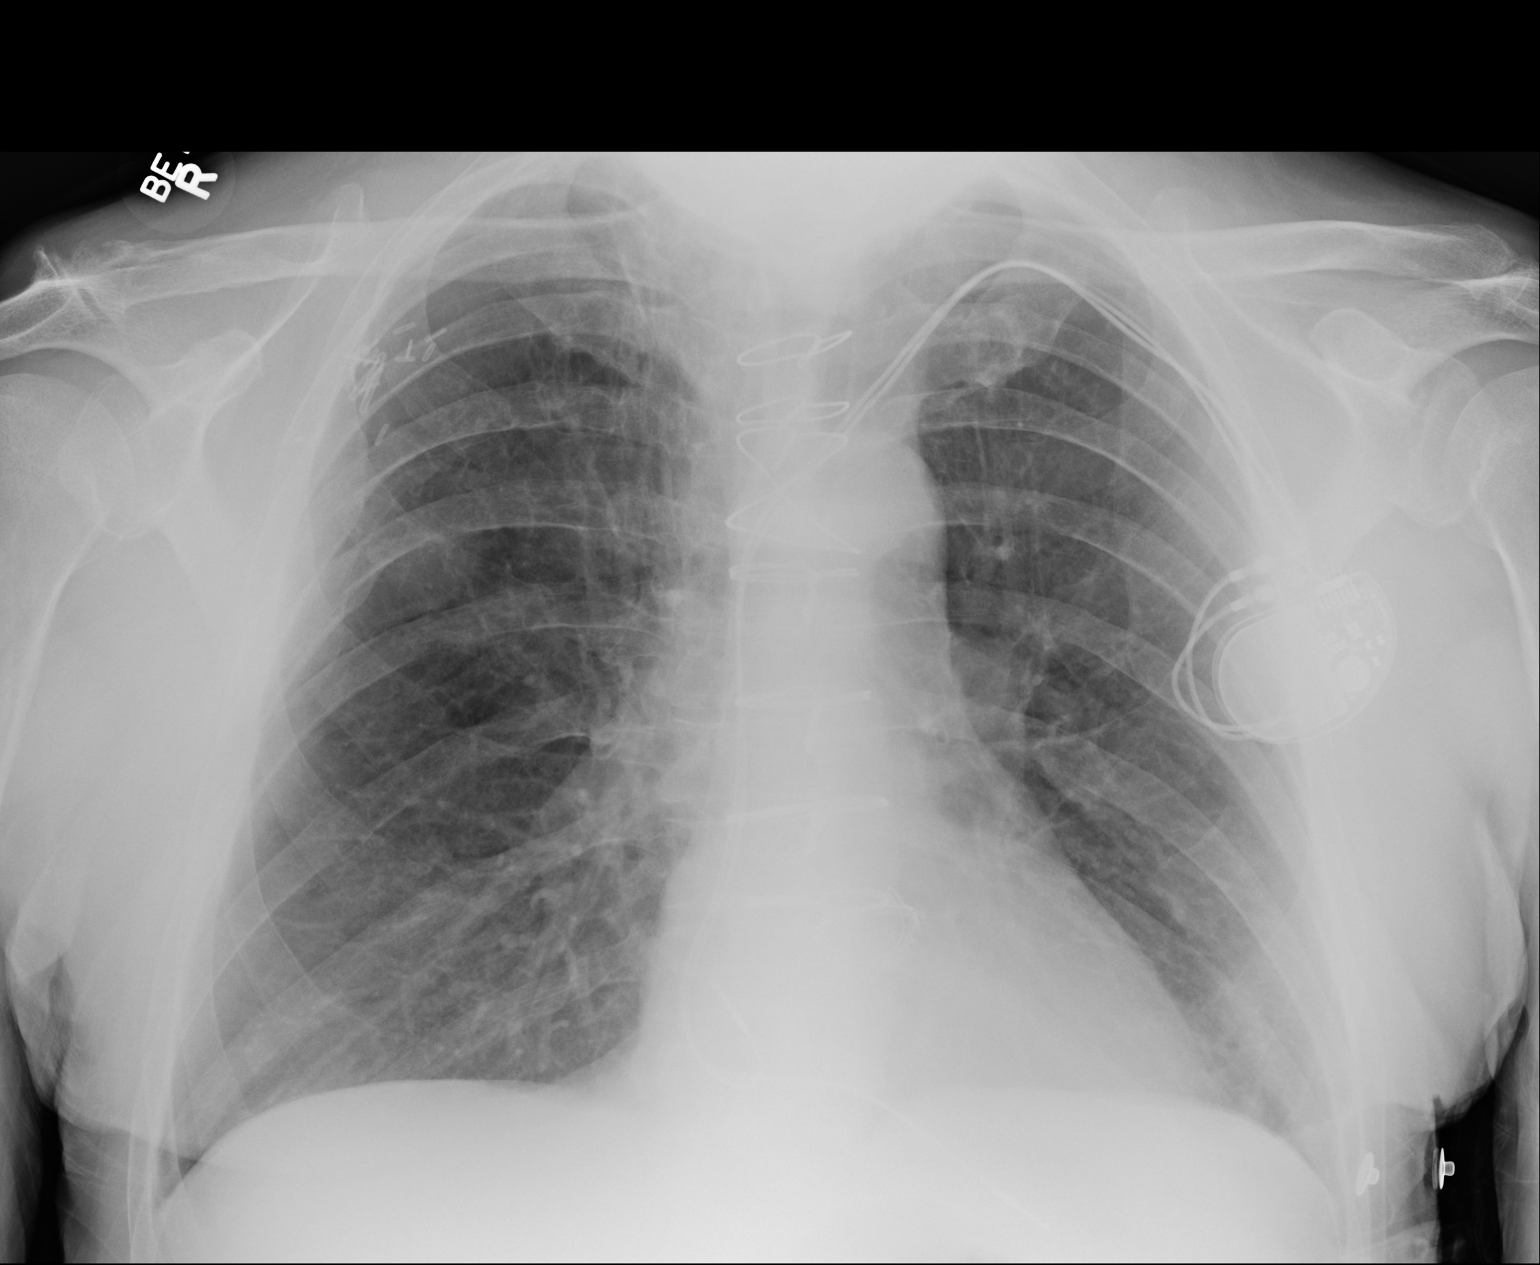

[1 of 1 positions shown; findings below may reference images not displayed]

FINDINGS: Pacer. Prior median sternotomy. Remote right rib trauma. Chin
overlies the apices minimally. Normal heart size. No pleural
effusion or pneumothorax. Surgical clips project over the right lung
apex laterally. Hyperinflation.
IMPRESSION: Hyperinflation without acute superimposed process.

## 2017-07-15 IMAGING — CR DG CHEST 1V PORT
1 series · 2 of 2 positions shown · non-contrast
Comparison: 07/24/2015 and 05/30/2015.

CLINICAL DATA: Right-sided stabbing chest pain with bilateral arm
pain and shortness of breath for 1 week. History of myocardial
infarction and COPD. Initial encounter.

EXAM:
PORTABLE CHEST 1 VIEW

[Series 1: ap portable · 0.17mm/px · 2 of 2 slices shown]
[im 1/2]
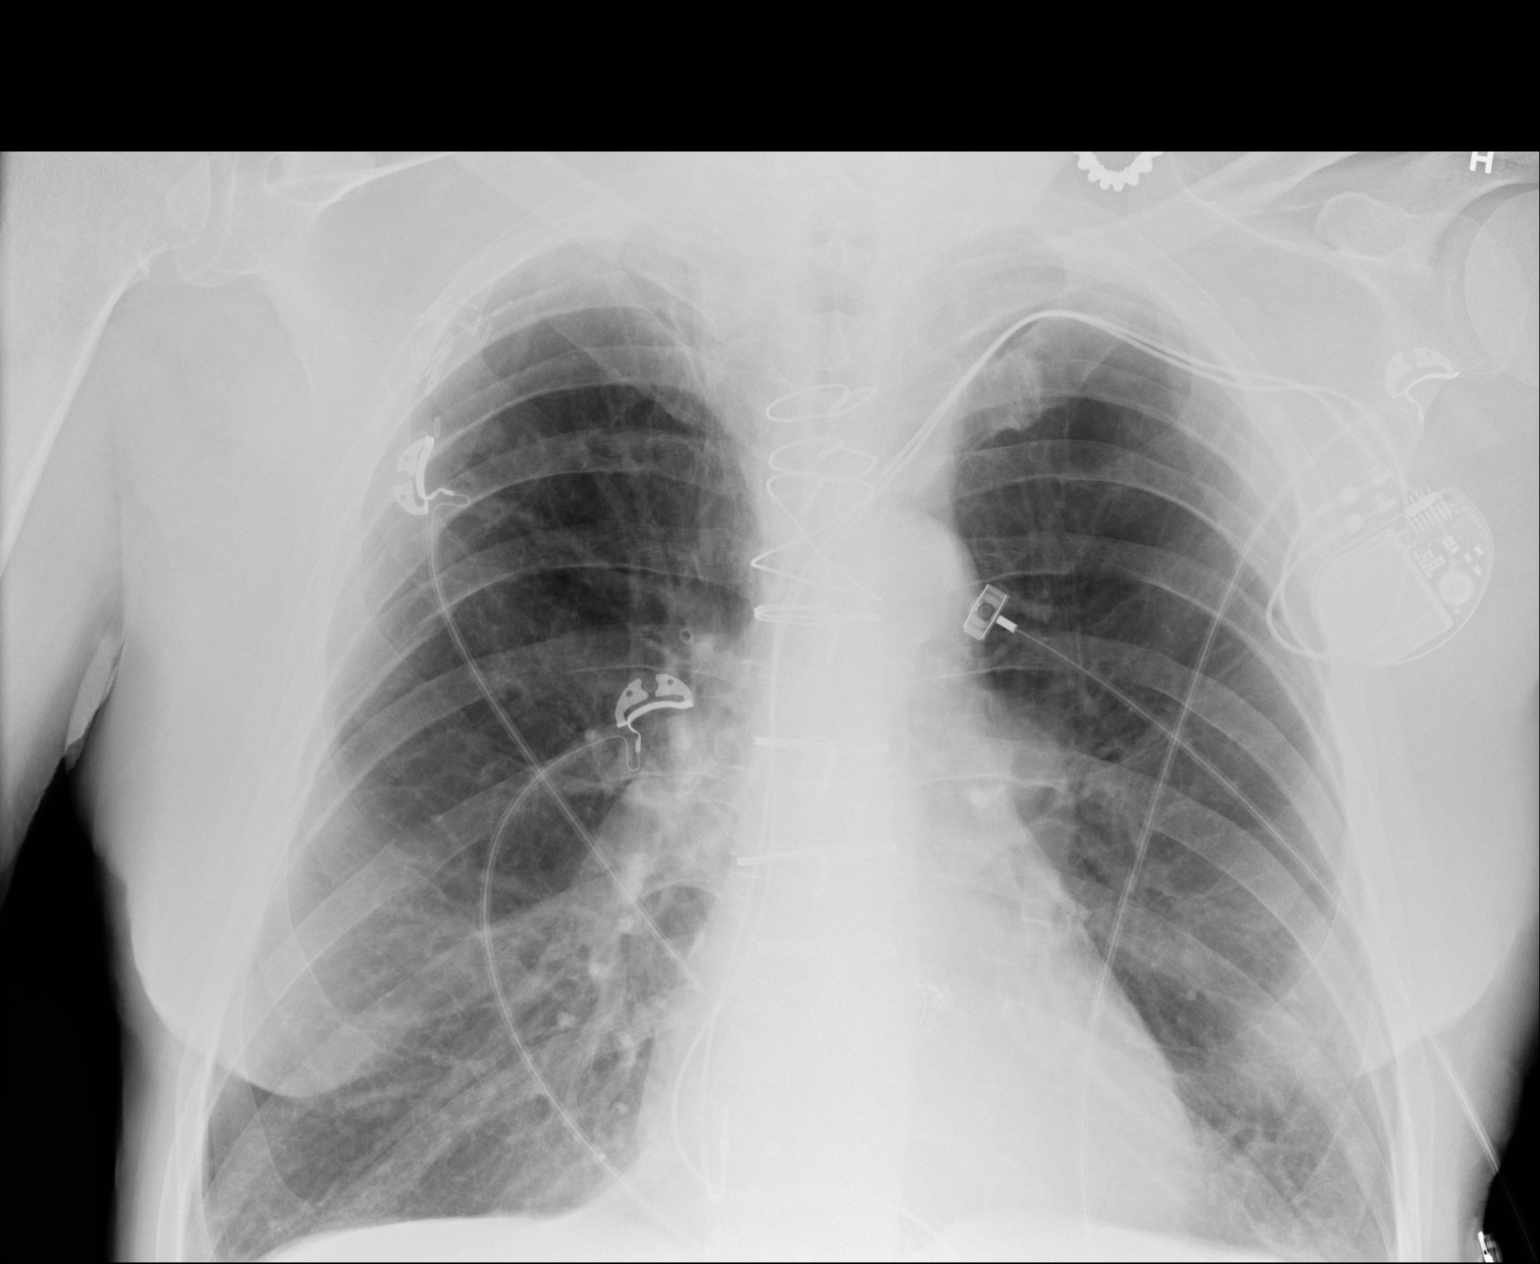
[im 2/2]
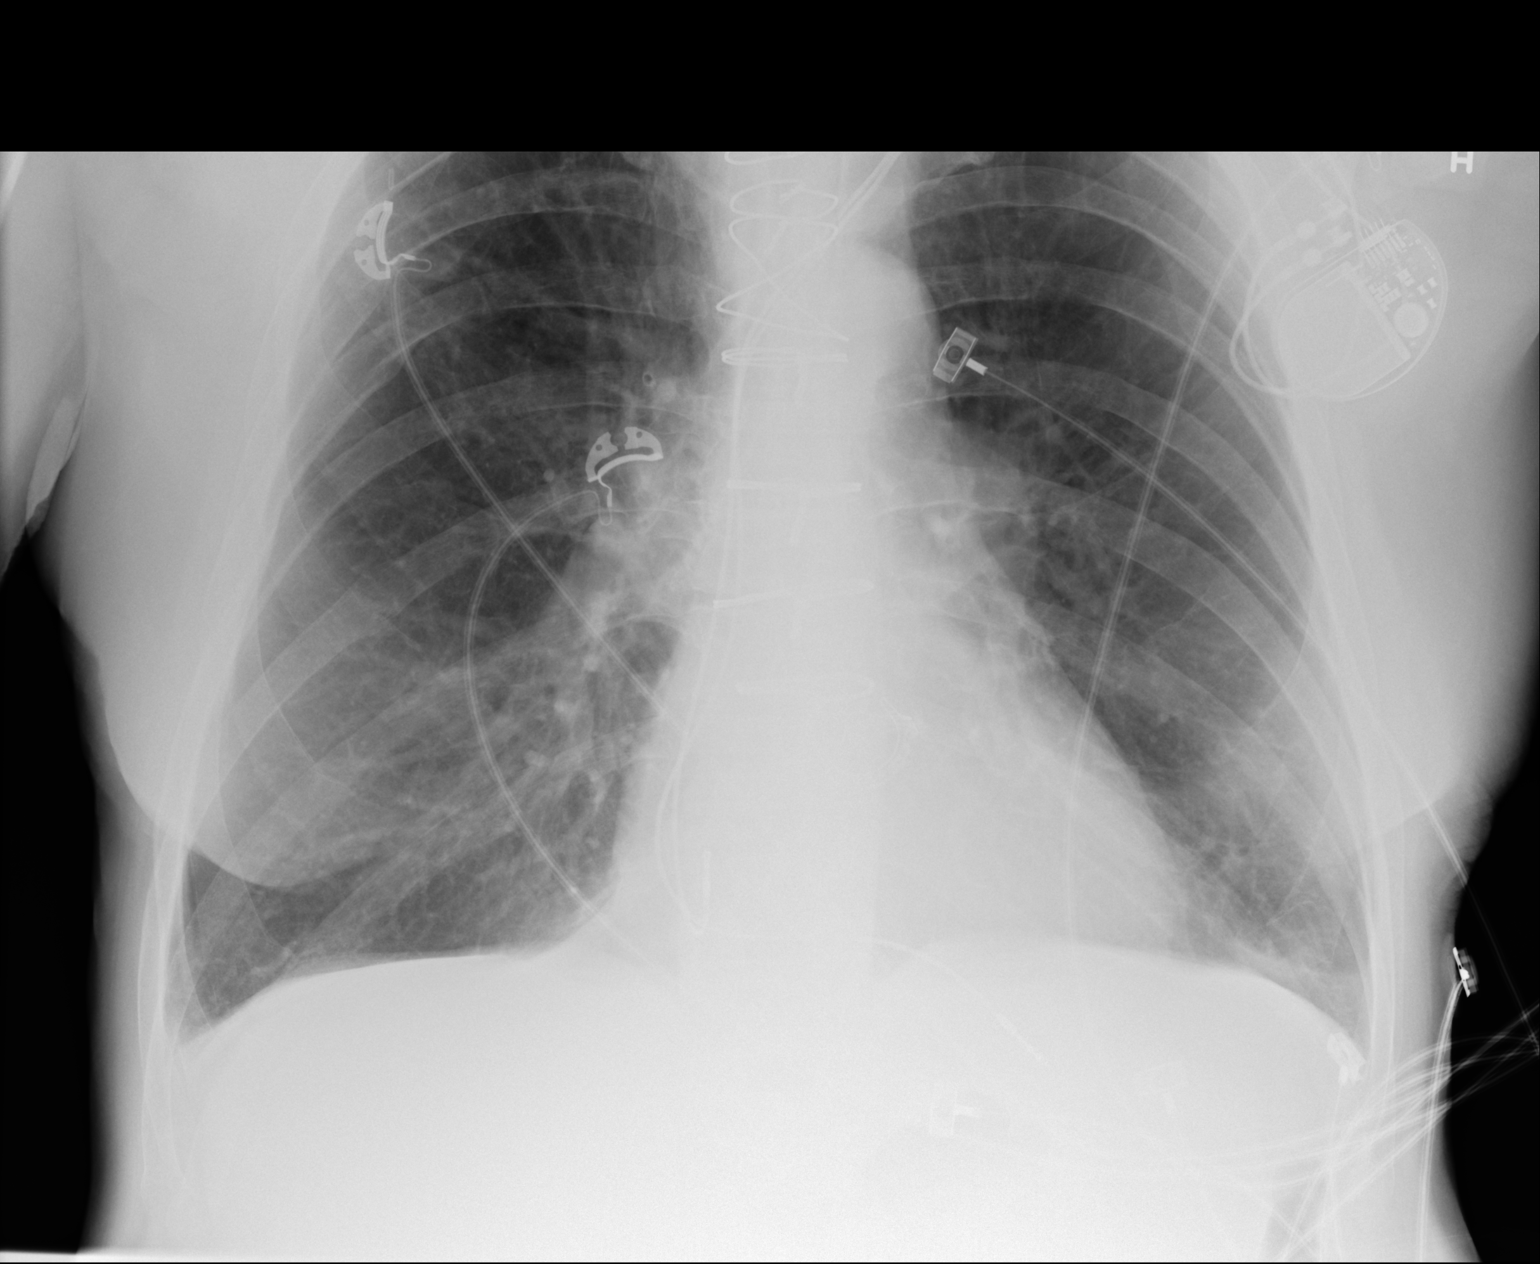

[2 of 2 positions shown; findings below may reference images not displayed]

FINDINGS: 0651 hours. Two views obtained. Left subclavian pacemaker leads
appear unchanged within the right atrium and right ventricle. The
heart size and mediastinal contours are stable status post median
sternotomy and aortic valve replacement. The lungs are hyperinflated
but clear. There is no pleural effusion or pneumothorax. Surgical
clips are present within the right axilla. There are postsurgical
changes within the right chest wall.
IMPRESSION: Stable postoperative chest.  No acute cardiopulmonary process.

## 2017-07-26 IMAGING — CR DG CHEST 1V PORT
1 series · 1 of 1 positions shown · non-contrast
Comparison: 08/01/2015

CLINICAL DATA: Shortness of Breath

EXAM:
PORTABLE CHEST 1 VIEW

[ap portable]
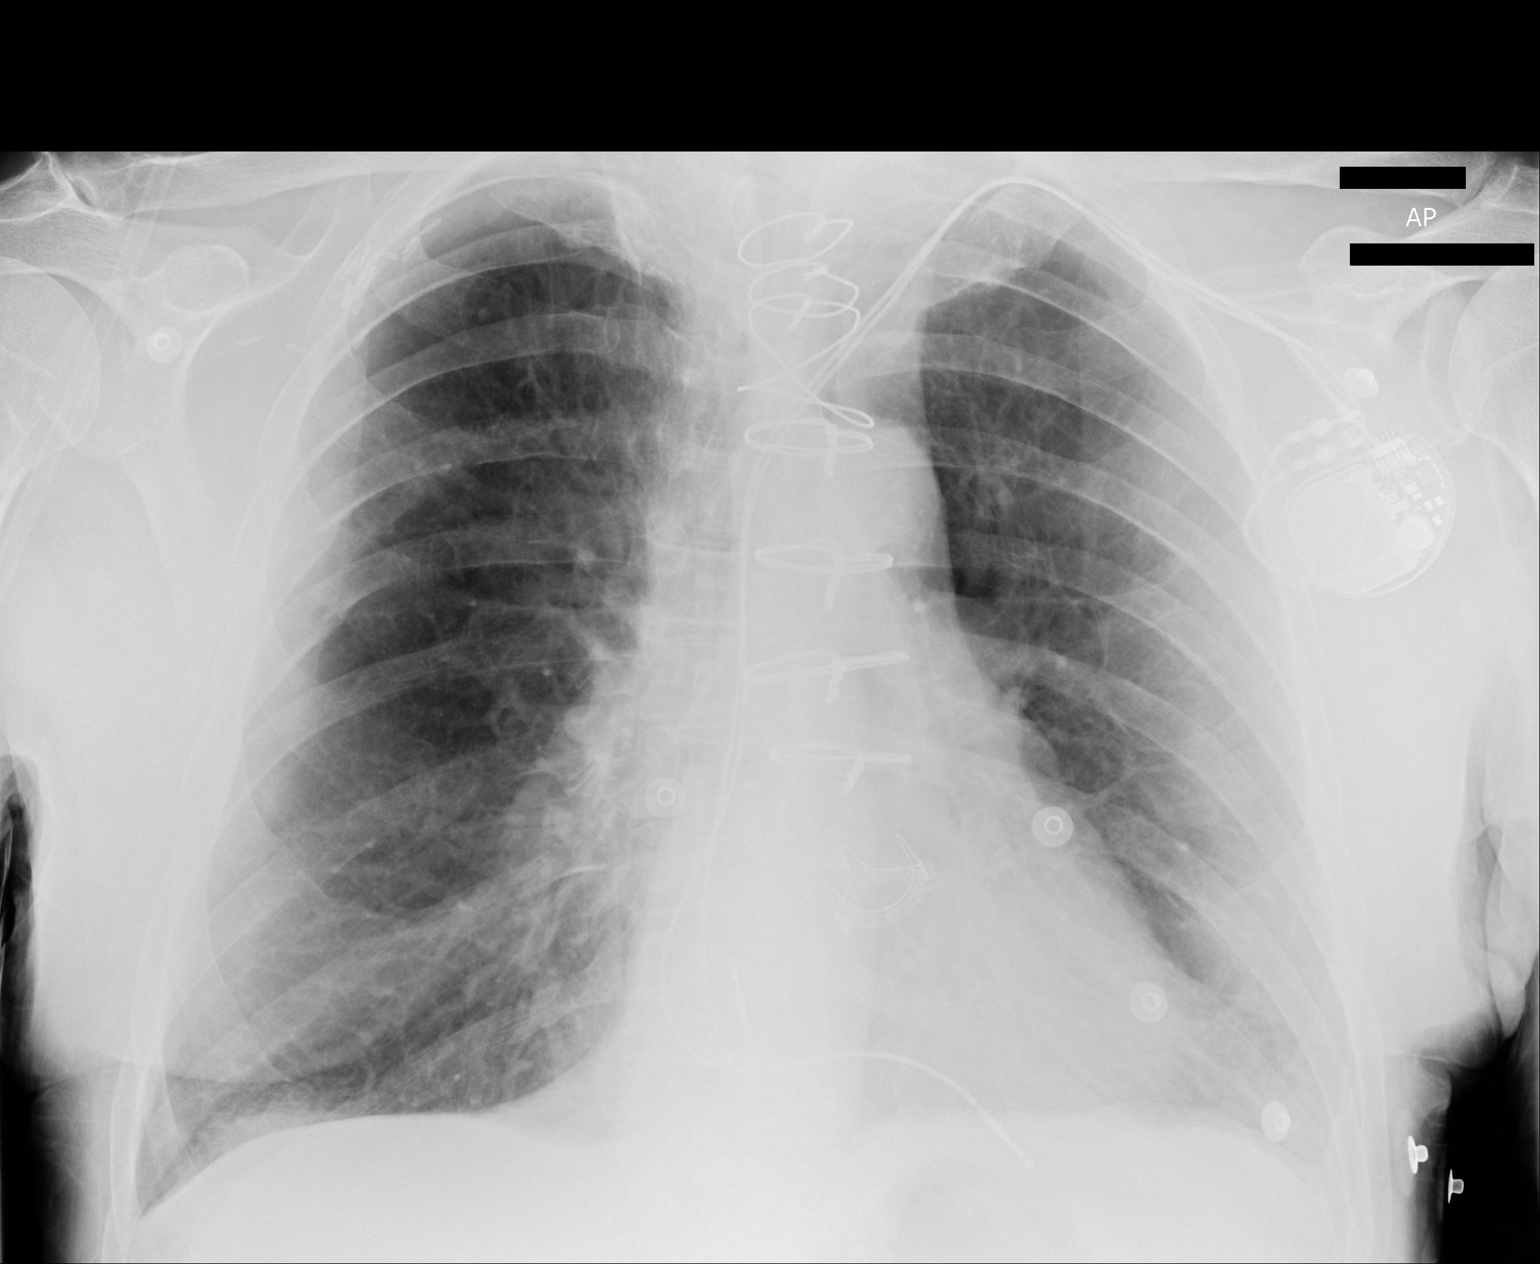

[1 of 1 positions shown; findings below may reference images not displayed]

FINDINGS: The mediastinal silhouette is stable. Status post median sternotomy.
Dual lead cardiac pacemaker is unchanged in position. No acute
infiltrate or pleural effusion. No pulmonary edema. Old right upper
rib fractures are again noted.
IMPRESSION: No active disease.

## 2020-02-25 ENCOUNTER — Ambulatory Visit (INDEPENDENT_AMBULATORY_CARE_PROVIDER_SITE_OTHER): Payer: Medicare PPO

## 2020-02-25 ENCOUNTER — Other Ambulatory Visit: Payer: Self-pay

## 2020-02-25 ENCOUNTER — Telehealth: Payer: Self-pay | Admitting: Adult Health

## 2020-02-25 ENCOUNTER — Ambulatory Visit: Payer: Medicare HMO | Admitting: Emergency Medicine

## 2020-02-25 ENCOUNTER — Encounter: Payer: Self-pay | Admitting: Emergency Medicine

## 2020-02-25 DIAGNOSIS — Z720 Tobacco use: Secondary | ICD-10-CM

## 2020-02-25 DIAGNOSIS — J9611 Chronic respiratory failure with hypoxia: Secondary | ICD-10-CM

## 2020-02-25 DIAGNOSIS — J441 Chronic obstructive pulmonary disease with (acute) exacerbation: Secondary | ICD-10-CM | POA: Diagnosis not present

## 2020-02-25 MED ORDER — TRELEGY ELLIPTA 100-62.5-25 MCG/INH IN AEPB: 1.0000 | INHALATION_SPRAY | Freq: Every day | RESPIRATORY_TRACT | 0 refills | Status: DC

## 2020-02-25 NOTE — Progress Notes (Signed)
Subjective:    Patient ID: [name] [name], male    DOB: 1946/04/17, 74 y.o.   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  He was diagnosed with COPD over 10 years ago.  He has been managed with Symbicort for 2 years.  He also has DuoNeb which he uses 4-5x a day, gets relief from this.  His functional capacity has changed significantly over the last 3 years. He now is dyspneic with almost any activity - even walking through the room. He has O2 that he uses at night on 2L/min, has started using with exertion as well. Has portable tanks. Minimal cough or sputum, frequent wheeze. He does not exacerbate frequently.   CAT score today is 28.   Smoking 1 pk/day.    Review of Systems As per HPI   Past Medical History:  Diagnosis Date  . Abnormal thyroid blood test 09/11/2015  . Anxiety and depression   . Bipolar disorder (HCC)   . BPH (benign prostatic hyperplasia)   . CAD (coronary artery disease)    ?MI in 2007  . CHF (congestive heart failure) (HCC)   . Chronic combined systolic and diastolic CHF (congestive heart failure) (HCC) 12/05/2014  . COPD (chronic obstructive pulmonary disease) (HCC)   . COPD (chronic obstructive pulmonary disease) (HCC)   . Homelessness   . Neuropathic pain   . Pacemaker    battery replaced 06/2014 at [facility]  . Pneumothorax    traumatic 2007  . Psychosis (HCC)   . Severe aortic valve stenosis    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 at [facility]  . SSS (sick sinus syndrome) (HCC)    s/p PPM   . Thoracic ascending aortic aneurysm (HCC)    repaired 06/2014 at [facility]  . Tobacco abuse      Family History  Problem Relation Age of Onset  . CAD Father   . Diabetes Father   . High blood pressure Brother   . Cancer Brother      Social History   Socioeconomic History  . Marital status:  Divorced    Spouse name: Not on file  . Number of children: Not on file  . Years of education: Not on file  . Highest education level: Not on file  Occupational History  . Not on file  Tobacco Use  . Smoking status: Current Every Day Smoker    Packs/day: 1.00    Years: 35.00    Pack years: 35.00    Types: Cigarettes  . Smokeless tobacco: Never Used  . Tobacco comment: down to 0.5 PPD 02/25/20  Substance and Sexual Activity  . Alcohol use: Yes    Comment: occ  . Drug use: Yes    Types: Marijuana    Comment: last used today  . Sexual activity: Not Currently    Birth control/protection: Abstinence  Other Topics Concern  . Not on file  Social History Narrative  . Not on file   Social Determinants of Health   Financial Resource Strain:   . Difficulty of Paying Living Expenses:   Food Insecurity:   . Worried About Programme researcher, broadcasting/film/video in the Last Year:   . Barista in the Last Year:   Transportation Needs:   . Freight forwarder (Medical):   Marland Kitchen Lack of Transportation (Non-Medical):   Physical Activity:   .  Days of Exercise per Week:   . Minutes of Exercise per Session:   Stress:   . Feeling of Stress :   Social Connections:   . Frequency of Communication with Friends and Family:   . Frequency of Social Gatherings with Friends and Family:   . Attends Religious Services:   . Active Member of Clubs or Organizations:   . Attends Banker Meetings:   Marland Kitchen Marital Status:   Intimate Partner Violence:   . Fear of Current or Ex-Partner:   . Emotionally Abused:   Marland Kitchen Physically Abused:   . Sexually Abused:     Worked on Administrator, Community education officer.  From [place] No military   Allergies  Allergen Reactions  . Tiotropium Bromide Monohydrate Nausea And Vomiting, Other (See Comments) and Nausea Only    Tremors Reaction:  Tremors  . Morphine And Related Other (See Comments)    Reaction:  Hallucinations      Outpatient Medications Prior to Visit   Medication Sig Dispense Refill  . ipratropium-albuterol (DUONEB) 0.5-2.5 (3) MG/3ML SOLN Inhale 3 mLs into the lungs every 4 (four) hours as needed (for shortness of breath). 360 mL 1  . SYMBICORT 160-4.5 MCG/ACT inhaler INHALE TWO PUFFS BY MOUTH TWICE DAILY  0  . albuterol (PROVENTIL HFA;VENTOLIN HFA) 108 (90 BASE) MCG/ACT inhaler Inhale 2 puffs into the lungs every 6 (six) hours as needed for wheezing or shortness of breath. (Patient not taking: Reported on 02/25/2020) 1 Inhaler 2  . aspirin EC 81 MG tablet Take 81 mg by mouth daily.    Marland Kitchen docusate sodium (COLACE) 100 MG capsule Take 100 mg by mouth daily.    . folic acid (FOLVITE) 1 MG tablet Take 1 tablet (1 mg total) by mouth daily. (Patient not taking: Reported on 02/25/2020) 30 tablet 0  . furosemide (LASIX) 40 MG tablet Take 1 tablet (40 mg total) by mouth daily. (Patient not taking: Reported on 02/25/2020) 30 tablet 0  . guaiFENesin (MUCINEX) 600 MG 12 hr tablet Take 2 tablets (1,200 mg total) by mouth 2 (two) times daily. (Patient not taking: Reported on 02/25/2020) 30 tablet 0  . potassium chloride SA (K-DUR,KLOR-CON) 20 MEQ tablet Take 20 mEq by mouth daily.    . predniSONE (DELTASONE) 10 MG tablet Take 40mg  po daily for 2 days then 30mg  daily for 2 days then 20mg  daily for 2 days then 10mg  daily for 2 days then stop (Patient not taking: Reported on 02/25/2020) 20 tablet 0  . tamsulosin (FLOMAX) 0.4 MG CAPS capsule Take 0.4 mg by mouth at bedtime.    . vardenafil (LEVITRA) 10 MG tablet Take 10 mg by mouth as needed for erectile dysfunction.     No facility-administered medications prior to visit.        Objective:   Physical Exam Vitals:   02/25/20 0935  BP: (!) 144/86  Pulse: 74  Temp: 98.3 F (36.8 C)  TempSrc: Temporal  SpO2: 95%  Weight: 180 lb (81.6 kg)  Height: 5\' 10"  (1.778 m)   Gen: Pleasant, chronically ill-appearing, in no distress,  normal affect  ENT: No lesions,  mouth clear,  oropharynx clear, no postnasal  drip  Neck: No JVD, no stridor  Lungs: No use of accessory muscles, no crackles or wheezing on normal respiration, he does wheeze bilaterally on a forced expiration  Cardiovascular: RRR, heart sounds normal, no murmur or gallops, asymmetric lower extremity edema, 3+ to just above the mid shin on the left, none on  the right.  This is chronic per the patient's report  Musculoskeletal: No deformities, no cyanosis or clubbing  Neuro: alert, awake, non focal  Skin: Warm, no lesions or rash       Assessment & Plan:  COPD (chronic obstructive pulmonary disease) ([place]) Almost certainly severe COPD, Gold D based on his symptoms and functional limitation.  We will perform PFT to quantify his degree of obstruction.  He does not exacerbate frequently but is probably benefited from the ICS component of his Symbicort as he has minimal secretions, minimal cough.  I would like to add a LAMA, will change him to Trelegy to see if he benefits.  Plan to use DuoNeb as needed, albuterol HFA as needed.  Covid vaccine up-to-date  We will stop Symbicort for now Start Trelegy one inhalation once a day. Rinse and gargle after using Keep DuoNeb available to use up to every 6 hours as you needed for shortness of breath. Keep albuterol HFA available use 2 puffs if needed for shortness of breath, chest tightness, wheezing. We will perform pulmonary function testing in next office visit Chest x-ray today We talked about smoking today.  We will work more on decreasing your cigarettes as we go forward. COVID-19 vaccine up-to-date. We will discuss getting the flu shot next fall Follow with Dr Lamonte Sakai in 1 month or next available with full PFT on the same day.  Chronic respiratory failure (HCC) With hypoxemia.  He is using exertional oxygen as needed.  We will try to perform a walking oximetry today to help [place] with oxygen dosing.  He uses a Agricultural consultant with a roller, DME company is in [place]  abuse Continues to smoke.  Spoke briefly about this today.  We will work on cessation techniques, try to decrease.  Will revisit at future visit.  Baltazar Apo, MD, PhD 02/25/2020, 10:10 AM [facility] [phone] or if no answer [phone]

## 2020-02-25 NOTE — Addendum Note (Signed)
Addended by: Velvet Bathe on: 02/25/2020 10:18 AM   Modules accepted: Orders

## 2020-02-25 NOTE — Assessment & Plan Note (Signed)
Continues to smoke.  Spoke briefly about this today.  We will work on cessation techniques, try to decrease.  Will revisit at future visit.

## 2020-02-25 NOTE — Assessment & Plan Note (Addendum)
Almost certainly severe COPD, Gold D based on his symptoms and functional limitation.  We will perform PFT to quantify his degree of obstruction.  He does not exacerbate frequently but is probably benefited from the ICS component of his Symbicort as he has minimal secretions, minimal cough.  I would like to add a LAMA, will change him to Trelegy to see if he benefits.  Plan to use DuoNeb as needed, albuterol HFA as needed.  Covid vaccine up-to-date  We will stop Symbicort for now Start Trelegy one inhalation once a day. Rinse and gargle after using Keep DuoNeb available to use up to every 6 hours as you needed for shortness of breath. Keep albuterol HFA available use 2 puffs if needed for shortness of breath, chest tightness, wheezing. We will perform pulmonary function testing in next office visit Chest x-ray today We talked about smoking today.  We will work more on decreasing your cigarettes as we go forward. COVID-19 vaccine up-to-date. We will discuss getting the flu shot next fall Follow with Dr Delton Coombes in 1 month or next available with full PFT on the same day.

## 2020-02-25 NOTE — Telephone Encounter (Signed)
[facility] - please advise if the walk test values were sent with the pt order. Thanks.

## 2020-02-25 NOTE — Assessment & Plan Note (Signed)
With hypoxemia.  He is using exertional oxygen as needed.  We will try to perform a walking oximetry today to help [place] with oxygen dosing.  He uses a Cabin crew with a roller, DME company is in [place]

## 2020-02-25 NOTE — Telephone Encounter (Signed)
Walk has been faxed

## 2020-02-25 NOTE — Patient Instructions (Addendum)
We will stop Symbicort for now Start Trelegy one inhalation once a day. Rinse and gargle after using Keep DuoNeb available to use up to every 6 hours as you needed for shortness of breath. Keep albuterol HFA available use 2 puffs if needed for shortness of breath, chest tightness, wheezing. We will perform pulmonary function testing in next office visit Chest x-ray today Walking oximetry today to determine how much oxygen need to wear when you are exerting yourself. Please continue your oxygen at 2 L/min at night while sleeping. We talked about smoking today.  We will work more on decreasing your cigarettes as we go forward. COVID-19 vaccine up-to-date. We will discuss getting the flu shot next fall Follow with Dr Delton Coombes in 1 month or next available with full PFT on the same day.

## 2020-03-28 ENCOUNTER — Other Ambulatory Visit: Payer: Self-pay

## 2020-03-28 ENCOUNTER — Other Ambulatory Visit (HOSPITAL_COMMUNITY)
Admission: RE | Admit: 2020-03-28 | Discharge: 2020-03-28 | Disposition: A | Discharge status: Home / Self Care | Payer: Medicare PPO | Source: Ambulatory Visit | Attending: Emergency Medicine | Admitting: Emergency Medicine

## 2020-03-28 DIAGNOSIS — Z20822 Contact with and (suspected) exposure to covid-19: Secondary | ICD-10-CM | POA: Insufficient documentation

## 2020-03-28 DIAGNOSIS — Z01812 Encounter for preprocedural laboratory examination: Secondary | ICD-10-CM | POA: Diagnosis present

## 2020-03-29 LAB — SARS CORONAVIRUS 2 (TAT 6-24 HRS): SARS Coronavirus 2: NEGATIVE

## 2020-03-31 ENCOUNTER — Other Ambulatory Visit: Payer: Self-pay

## 2020-03-31 ENCOUNTER — Encounter: Payer: Self-pay | Admitting: Adult Health

## 2020-03-31 ENCOUNTER — Ambulatory Visit: Payer: Medicare PPO | Admitting: Adult Health

## 2020-03-31 ENCOUNTER — Ambulatory Visit (INDEPENDENT_AMBULATORY_CARE_PROVIDER_SITE_OTHER): Payer: Medicare PPO | Admitting: Emergency Medicine

## 2020-03-31 ENCOUNTER — Other Ambulatory Visit: Payer: Self-pay | Admitting: *Deleted

## 2020-03-31 DIAGNOSIS — J449 Chronic obstructive pulmonary disease, unspecified: Secondary | ICD-10-CM

## 2020-03-31 DIAGNOSIS — J9611 Chronic respiratory failure with hypoxia: Secondary | ICD-10-CM

## 2020-03-31 DIAGNOSIS — J9612 Chronic respiratory failure with hypercapnia: Secondary | ICD-10-CM | POA: Diagnosis not present

## 2020-03-31 DIAGNOSIS — I5042 Chronic combined systolic (congestive) and diastolic (congestive) heart failure: Secondary | ICD-10-CM | POA: Diagnosis not present

## 2020-03-31 DIAGNOSIS — J441 Chronic obstructive pulmonary disease with (acute) exacerbation: Secondary | ICD-10-CM | POA: Diagnosis not present

## 2020-03-31 LAB — PULMONARY FUNCTION TEST
FEF 25-75 Pre: 0.15 L/sec
FEF2575-%Pred-Pre: 6 %
FEV1-%Pred-Pre: 8 %
FEV1-Pre: 0.29 L
FEV1FVC-%Pred-Pre: 33 %
FEV6-%Pred-Pre: 23 %
FEV6-Pre: 1.01 L
FEV6FVC-%Pred-Pre: 92 %
FVC-%Pred-Pre: 25 %
FVC-Pre: 1.16 L
Pre FEV1/FVC ratio: 25 %
Pre FEV6/FVC Ratio: 87 %

## 2020-03-31 NOTE — Assessment & Plan Note (Signed)
Chronic hypoxic respiratory failure.  Patient is continue on oxygen 2 L at rest and 4 L with activity.

## 2020-03-31 NOTE — Progress Notes (Signed)
PFT completed today.  

## 2020-03-31 NOTE — Assessment & Plan Note (Signed)
Patient has chronic lower extremity swelling.  Patient is advised to continue to follow-up with cardiology as planned.

## 2020-03-31 NOTE — Progress Notes (Signed)
@Patient  ID: [name] [name], male    DOB: [birth year], 74 y.o.   MRN: [id number]  Chief Complaint  Patient presents with  . Follow-up    COPD     Referring provider: Orpah Greek, MD  HPI: 74 year old male active smoker seen for pulmonary consult from February 25, 2020 for COPD.  Patient has significant symptom burden with significant shortness of breath with minimal activity.  Has trouble walking any amount of distance. Medical history significant for coronary artery disease, combined systolic and diastolic heart failure, aortic valve replacement , sick sinus syndrome with pacemaker placement. Patient with COPD around 2011, started on oxygen with activity and at bedtime.  Around 2020.  TEST/EVENTS :   03/31/2020 Follow up : COPD , O2 RF  Patient presents for a 6-week follow-up.  Patient was seen last visit for a pulmonary consult to establish for COPD.  Patient is oxygen dependent previously on 2 L.  Last visit patient was changed from Symbicort to Trelegy inhaler.  Patient says he did not like this and is returned back to Symbicort.  Previously tried on Spiriva but was not able to tolerate.  He uses DuoNeb anywhere between 4-6 times a day.  He does continue to smoke.  He says some days are more than less.  Anywhere from 10 to 20 cigarettes a day.  Okay cessation was discussed.  Patient was set up for pulmonary function testing to be done today however patient was unable to complete due to significant shortness of breath.  And inability to perform test. Patient says he was recently in the emergency room for COPD exacerbation.  Treated with steroids.  Patient says he is feeling better.  At that time his oxygen was increased to 4 L with activity.  Records are not available. Today in office patient did not bring his oxygen he was placed on one of our tanks patient was able to keep O2 saturations at rest at 2 L at 99%.  Covid vaccine is up-to-date.  Allergies  Allergen Reactions  . Tiotropium  Bromide Monohydrate Nausea And Vomiting, Other (See Comments) and Nausea Only    Tremors Reaction:  Tremors  . Morphine And Related Other (See Comments)    Reaction:  Hallucinations     Immunization History  Administered Date(s) Administered  . Influenza, Seasonal, Injecte, Preservative Fre 07/20/2014  . Moderna SARS-COVID-2 Vaccination 12/29/2019, 01/19/2020    Past Medical History:  Diagnosis Date  . Abnormal thyroid blood test 09/11/2015  . Anxiety and depression   . Bipolar disorder ([place])   . BPH (benign prostatic hyperplasia)   . CAD (coronary artery disease)    ?MI in 2007  . CHF (congestive heart failure) ([place])   . Chronic combined systolic and diastolic CHF (congestive heart failure) ([place]) 12/05/2014  . COPD (chronic obstructive pulmonary disease) ([place])   . COPD (chronic obstructive pulmonary disease) ([place])   . Homelessness   . Neuropathic pain   . Pacemaker    battery replaced 06/2014 at [facility]  . Pneumothorax    traumatic 2007  . Psychosis ([place])   . Severe aortic valve stenosis    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 at [facility]  . SSS (sick sinus syndrome) (HCC)    s/p PPM   . Thoracic ascending aortic aneurysm ([place])    repaired 06/2014 at [facility]  . Tobacco abuse     Tobacco History: Social History   Tobacco Use  Smoking Status Current Every Day Smoker  . Packs/day: 1.00  .  Years: 35.00  . Pack years: 35.00  . Types: Cigarettes  Smokeless Tobacco Never Used  Tobacco Comment   down to 0.5 PPD 02/25/20   Ready to quit: No Counseling given: Yes Comment: down to 0.5 PPD 02/25/20   Outpatient Medications Prior to Visit  Medication Sig Dispense Refill  . albuterol (PROVENTIL HFA;VENTOLIN HFA) 108 (90 BASE) MCG/ACT inhaler Inhale 2 puffs into the lungs every 6 (six) hours as needed for wheezing or shortness of breath. 1 Inhaler 2  . ipratropium-albuterol (DUONEB) 0.5-2.5 (3) MG/3ML SOLN Inhale 3 mLs into the lungs every 4 (four) hours as needed (for shortness  of breath). 360 mL 1  . SYMBICORT 160-4.5 MCG/ACT inhaler INHALE TWO PUFFS BY MOUTH TWICE DAILY  0  . Fluticasone-Umeclidin-Vilant (TRELEGY ELLIPTA) 100-62.5-25 MCG/INH AEPB Inhale 1 puff into the lungs daily. 3 each 0   No facility-administered medications prior to visit.     Review of Systems:   Constitutional:   No  weight loss, night sweats,  Fevers, chills ++, fatigue, or  lassitude.  HEENT:   No headaches,  Difficulty swallowing,  Tooth/dental problems, or  Sore throat,                No sneezing, itching, ear ache, nasal congestion, post nasal drip,   CV:  No chest pain,  Orthopnea, PND, ++swelling in lower extremities, anasarca, dizziness, palpitations, syncope.   GI  No heartburn, indigestion, abdominal pain, nausea, vomiting, diarrhea, change in bowel habits, loss of appetite, bloody stools.   Resp:    No chest wall deformity  Skin: no rash or lesions.  GU: no dysuria, change in color of urine, no urgency or frequency.  No flank pain, no hematuria   MS:  No joint pain or swelling.  No decreased range of motion.  No back pain.    Physical Exam  BP 126/68 (BP Location: Left Arm, Cuff Size: Normal)   Pulse 78   Temp 98.1 F (36.7 C) (Oral)   Ht 5\' 10"  (1.778 m)   Wt 180 lb (81.6 kg)   SpO2 100%   BMI 25.83 kg/m   GEN: A/Ox3; pleasant , NAD, chronically ill-appearing elderly male in wheelchair on oxygen   HEENT:  /AT, , NOSE-clear, THROAT-clear, no lesions, no postnasal drip or exudate noted.   NECK:  Supple w/ fair ROM; no JVD; normal carotid impulses w/o bruits; no thyromegaly or nodules palpated; no lymphadenopathy.    RESP  Clear  P & A; w/o, wheezes/ rales/ or rhonchi. no accessory muscle use, no dullness to percussion  CARD:  RRR, no m/r/g, no peripheral edema, pulses intact, no cyanosis or clubbing.  GI:   Soft & nt; nml bowel sounds; no organomegaly or masses detected.   Musco: Warm bil, no deformities or joint swelling noted.   Neuro: alert, no  focal deficits noted.    Skin: Warm, no lesions or rashes    Lab Results:    Imaging: No results found.    PFT Results Latest Ref Rng & Units 03/31/2020  FVC-Pre L 1.16  FVC-Predicted Pre % 25  Pre FEV1/FVC % % 25  FEV1-Pre L 0.29  FEV1-Predicted Pre % 8    No results found for: NITRICOXIDE      Assessment & Plan:   COPD (chronic obstructive pulmonary disease) (HCC) Patient appears to have very severe COPD that is oxygen dependent.  Unfortunate was unable to complete pulmonary function testing today.  Patient is to continue on Symbicort and encouraged  to use DuoNeb only about 3-4 times a day.  Patient seems to be overusing this.  Have encouraged him on smoking cessation. Patient does wish to change to our any pain location as it will be much closer for him.  Patient says he has trouble driving long distances.  Plan  Patient Instructions  Continue on Symbicort 2 puffs Twice daily   Decrease Duoneb Four times a day   Activity as tolerated.  Oxygen 2l/m rest and 4l/m with activity .  Work on  not smoking .  May change to [facility] office to be closer to home  Follow up in [place] office in 4 weeks and As needed   Please contact office for sooner follow up if symptoms do not improve or worsen or seek emergency care       Chronic combined systolic and diastolic CHF (congestive heart failure) (HCC) Patient has chronic lower extremity swelling.  Patient is advised to continue to follow-up with cardiology as planned.  Chronic respiratory failure (HCC) Chronic hypoxic respiratory failure.  Patient is continue on oxygen 2 L at rest and 4 L with activity.     Rubye Oaks, NP 03/31/2020

## 2020-03-31 NOTE — Assessment & Plan Note (Signed)
Patient appears to have very severe COPD that is oxygen dependent.  Unfortunate was unable to complete pulmonary function testing today.  Patient is to continue on Symbicort and encouraged to use DuoNeb only about 3-4 times a day.  Patient seems to be overusing this.  Have encouraged him on smoking cessation. Patient does wish to change to our any pain location as it will be much closer for him.  Patient says he has trouble driving long distances.  Plan  Patient Instructions  Continue on Symbicort 2 puffs Twice daily   Decrease Duoneb Four times a day   Activity as tolerated.  Oxygen 2l/m rest and 4l/m with activity .  Work on  not smoking .  May change to [facility] office to be closer to home  Follow up in [place] office in 4 weeks and As needed   Please contact office for sooner follow up if symptoms do not improve or worsen or seek emergency care

## 2020-03-31 NOTE — Patient Instructions (Addendum)
Continue on Symbicort 2 puffs Twice daily   Decrease Duoneb Four times a day   Activity as tolerated.  Oxygen 2l/m rest and 4l/m with activity .  Work on  not smoking .  May change to [facility] office to be closer to home  Follow up in [place] office in 4 weeks and As needed   Please contact office for sooner follow up if symptoms do not improve or worsen or seek emergency care

## 2020-05-12 ENCOUNTER — Ambulatory Visit: Payer: Medicare PPO | Admitting: Pulmonary Disease

## 2021-03-30 ENCOUNTER — Ambulatory Visit: Payer: Medicare PPO | Admitting: Internal Medicine

## 2021-04-26 ENCOUNTER — Ambulatory Visit: Payer: Medicare PPO | Admitting: Internal Medicine

## 2021-04-26 ENCOUNTER — Encounter: Payer: Self-pay | Admitting: Internal Medicine

## 2021-04-26 ENCOUNTER — Other Ambulatory Visit: Payer: Self-pay

## 2021-04-26 DIAGNOSIS — J9611 Chronic respiratory failure with hypoxia: Secondary | ICD-10-CM | POA: Diagnosis not present

## 2021-04-26 DIAGNOSIS — J9612 Chronic respiratory failure with hypercapnia: Secondary | ICD-10-CM | POA: Diagnosis not present

## 2021-04-26 DIAGNOSIS — J449 Chronic obstructive pulmonary disease, unspecified: Secondary | ICD-10-CM

## 2021-04-26 MED ORDER — BREZTRI AEROSPHERE 160-9-4.8 MCG/ACT IN AERO: 2.0000 | INHALATION_SPRAY | Freq: Two times a day (BID) | RESPIRATORY_TRACT | 0 refills | Status: DC

## 2021-04-26 MED ORDER — ALBUTEROL SULFATE (2.5 MG/3ML) 0.083% IN NEBU: 2.5000 mg | INHALATION_SOLUTION | Freq: Four times a day (QID) | RESPIRATORY_TRACT | 12 refills | Status: DC | PRN

## 2021-04-26 MED ORDER — BREZTRI AEROSPHERE 160-9-4.8 MCG/ACT IN AERO: 2.0000 | INHALATION_SPRAY | Freq: Two times a day (BID) | RESPIRATORY_TRACT | 11 refills | Status: DC

## 2021-04-26 NOTE — Patient Instructions (Addendum)
Plan A = Automatic = Always=    Breztri Take 2 puffs first thing in am and then another 2 puffs about 12 hours later.     Work on inhaler technique:  relax and gently blow all the way out then take a nice smooth full deep breath back in, triggering the inhaler at same time you start breathing in.  Hold for up to 5 seconds if you can. Blow out thru nose. Rinse and gargle with water when done.  If mouth or throat bother you at all,  try brushing teeth/gums/tongue with arm and hammer toothpaste/ make a slurry and gargle and spit out.      Plan B = Backup (to supplement plan A, not to replace it) Only use your albuterol nebulizer as a rescue medication to be used if you can't catch your breath by resting or doing a relaxed purse lip breathing pattern.  - The less you use it, the better it will work when you need it. - Ok to use the inhaler up to every 4 hours if you must but call for appointment if use goes up over your usual need - Don't leave home without it !!  (think of it like the stater fluid/ spare tire for your car)   Make sure you check your oxygen saturation  at your highest level of activity  to be sure it stays over 90% and adjust  02 flow upward to maintain this level if needed but remember to turn it back to previous settings when you stop (to conserve your supply).   We will call to find out what your other medications are from your heart doctor    Ok to try Try albuterol 15 min before an activity (on alternating days)  that you know would make you short of breath and see if it makes any difference and if makes none then don't take albuterol after activity unless you can't catch your breath as this means it's the resting that helps, not the albuterol.  Please schedule a follow up office visit in 6 weeks, call sooner if needed with all medications /inhalers/ solutions in hand so we can verify exactly what you are taking. This includes all medications from all doctors and over the  counters

## 2021-04-26 NOTE — Progress Notes (Signed)
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/ combined sys/dias chf  referred to pulmonary clinic in [place]  04/26/2021 by Andrey Campanile NP due to difficulty driving to [place]     History of Present Illness  04/26/2021  Pulmonary/ 1st office eval/ [name] [name] / [name]  Chief Complaint  Patient presents with   New Patient (Initial Visit)    Patient is here to establish, Patient is on 3 liters all the time, he reports getting short of breath just brushing teeth,   Dyspnea:  25-50 ft uses public transportation pulls up to his door Cough: none  Sleep: on flat bed, on side / one pillow  SABA use: duoneb about 4-5 x daily despite symbicort though only getting maybe half of it in based on poor technique 02  3lpm 24/7   No obvious day to day or daytime variability or assoc excess/ purulent sputum or mucus plugs or hemoptysis or cp or chest tightness, subjective wheeze or overt sinus or hb symptoms.   sleeping without nocturnal  or early am exacerbation  of respiratory  c/o's or need for noct saba. Also denies any obvious fluctuation of symptoms with weather or environmental changes or other aggravating or alleviating factors except as outlined above   No unusual exposure hx or h/o childhood pna/ asthma or knowledge of premature birth.  Current Allergies, Complete Past Medical History, Past Surgical History, Family History, and Social History were reviewed in [name].  ROS  The following are not active complaints unless bolded Hoarseness, sore throat, dysphagia, dental problems, itching, sneezing,  nasal congestion or discharge of excess mucus or purulent secretions, ear ache,   fever, chills, sweats, unintended wt loss or wt gain, classically pleuritic or exertional cp,  orthopnea pnd or arm/hand swelling  or leg  swelling, presyncope, palpitations, abdominal pain, anorexia, nausea, vomiting, diarrhea  or change in bowel habits or change in bladder habits, change in stools or change in urine, dysuria, hematuria,  rash, arthralgias, visual complaints, headache, numbness, weakness or ataxia or problems with walking or coordination,  change in mood or  memory.            Past Medical History:  Diagnosis Date   Abnormal thyroid blood test 09/11/2015   Anxiety and depression    Bipolar disorder (HCC)    BPH (benign prostatic hyperplasia)    CAD (coronary artery disease)    ?MI in 2007   CHF (congestive heart failure) (HCC)    Chronic combined systolic and diastolic CHF (congestive heart failure) (HCC) 12/05/2014   COPD (chronic obstructive pulmonary disease) (HCC)    COPD (chronic obstructive pulmonary disease) (HCC)    Homelessness    Neuropathic pain    Pacemaker    battery replaced 06/2014 at [facility]   Pneumothorax    traumatic 2007   Psychosis [facility])    Severe aortic valve stenosis    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 1 Inhaler 2   ipratropium-albuterol (DUONEB) 0.5-2.5 (3) MG/3ML SOLN Inhale 3 mLs into the lungs every 4 (four) hours as needed (for shortness of breath). 360 mL 1   SYMBICORT 160-4.5 MCG/ACT inhaler INHALE TWO PUFFS BY MOUTH TWICE DAILY  0   No facility-administered medications prior to visit.     Objective:     BP 130/82 (BP Location: Right Arm, Cuff Size: Normal)   Pulse 80   Ht 5\' 10"  (1.778 m)   Wt 188 lb 3.7 oz (85.4 kg)   SpO2 99% Comment: 3 liters  BMI 27.01 kg/m   SpO2: 99 % (3  liters) O2 Flow Rate (L/min): 3 L/min  Chronically ill appearing amb wm nad at rest   HEENT : pt wearing mask not removed for exam due to covid -19 concerns.    NECK :  without JVD/Nodes/TM/ nl carotid upstrokes bilaterally   LUNGS: no acc muscle use,  Mod barrel  contour chest wall with bilateral  Distant bs s audible wheeze and  without cough on insp or exp maneuvers and mod  Hyperresonant  to  percussion bilaterally     CV:  RRR  no s3 or murmur or increase in P2, and no edema   ABD:  soft and nontender with pos mid insp Hoover's  in the supine position. No bruits or organomegaly appreciated, bowel sounds nl  MS:     ext warm without deformities, calf tenderness, cyanosis or clubbing No obvious joint restrictions   SKIN: warm and dry without lesions    NEURO:  alert, approp, nl sensorium with  no motor or cerebellar deficits apparent.             Assessment   COPD GOLD IV/ 02 dep 24/7 Quit smoking 2022  - Spirometry 03/31/20  FEV1 0.29 (8%)  Ratio 0.25 with classic concavity to f/v curve - 04/26/2021  After extensive coaching inhaler device,  effectiveness =    75% from baseline well < 50 (poor trigger coordination, very short Ti)> try breztri and approp saba     Clearly  Group D in terms of symptom/risk and laba/lama/ICS  therefore appropriate rx at this point >>>  breztri and saba neb  Re saba: I spent extra time with pt today reviewing appropriate use of albuterol for prn use on exertion with the following points: 1) saba is for relief of sob that does not improve by walking a slower pace or resting but rather if the pt does not improve after trying this first. 2) If the pt is convinced, as many are, that saba helps recover from activity faster then it's easy to tell if this is the case by re-challenging : ie stop, take the inhaler, then p 5 minutes try the exact same activity (intensity of workload) that just caused the symptoms and see if they are substantially  diminished or not after saba 3) if there is an activity that reproducibly causes the symptoms, try the saba 15 min before the activity on alternate days   If in fact the saba really does help, then fine to continue to use it prn but advised may need to look closer at the maintenance regimen being used to achieve better control of airways disease with exertion.        Chronic respiratory failure with hypoxia and hypercapnia (HCC) HCO3    08/2015  =     36   -  04/26/2021   Walked RA  approx   200 ft  @ avg  pace  stopped due to  [employer] and sats 89%  ("much more than normal walking" and did not require saba p walked    02 rx approp, pt severely deconditioned at this point and requesting consideration for transplant  - will check for him but explained all transplant centers will required prolonged rehab before considering him and he does not appear motivated to participate at this point   Each maintenance medication was reviewed in detail including emphasizing most importantly the difference between maintenance and prns and under what circumstances the prns are to be triggered using an action plan format where appropriate.  Total time for H and P, chart review, counseling, reviewing hfa device(s) , directly observing portions of ambulatory 02 saturation study/ and generating customized AVS unique to this office visit with pt new  To me/ same day charting = > 40 min                   Sandrea Hughs, MD 04/26/2021

## 2021-04-27 ENCOUNTER — Encounter: Payer: Self-pay | Admitting: Internal Medicine

## 2021-04-27 NOTE — Assessment & Plan Note (Signed)
HCO3    08/2015  =     36   -  04/26/2021   Walked RA  approx   200 ft  @ avg  pace  stopped due to  [employer] and sats 89%  ("much more than normal walking" and did not require saba p walked    02 rx approp, pt severely deconditioned at this point and requesting consideration for transplant  - will check for him but explained all transplant centers will required prolonged rehab before considering him and he does not appear motivated to participate at this point   Each maintenance medication was reviewed in detail including emphasizing most importantly the difference between maintenance and prns and under what circumstances the prns are to be triggered using an action plan format where appropriate.  Total time for H and P, chart review, counseling, reviewing hfa device(s) , directly observing portions of ambulatory 02 saturation study/ and generating customized AVS unique to this office visit with pt new  To me/ same day charting = > 40 min

## 2021-04-27 NOTE — Assessment & Plan Note (Signed)
Quit smoking 2022  - Spirometry 03/31/20  FEV1 0.29 (8%)  Ratio 0.25 with classic concavity to f/v curve - 04/26/2021  After extensive coaching inhaler device,  effectiveness =    75% from baseline well < 50 (poor trigger coordination, very short Ti)> try breztri and approp saba     Clearly  Group D in terms of symptom/risk and laba/lama/ICS  therefore appropriate rx at this point >>>  breztri and saba neb  Re saba: I spent extra time with pt today reviewing appropriate use of albuterol for prn use on exertion with the following points: 1) saba is for relief of sob that does not improve by walking a slower pace or resting but rather if the pt does not improve after trying this first. 2) If the pt is convinced, as many are, that saba helps recover from activity faster then it's easy to tell if this is the case by re-challenging : ie stop, take the inhaler, then p 5 minutes try the exact same activity (intensity of workload) that just caused the symptoms and see if they are substantially diminished or not after saba 3) if there is an activity that reproducibly causes the symptoms, try the saba 15 min before the activity on alternate days   If in fact the saba really does help, then fine to continue to use it prn but advised may need to look closer at the maintenance regimen being used to achieve better control of airways disease with exertion.

## 2021-05-31 ENCOUNTER — Telehealth: Payer: Self-pay | Admitting: Internal Medicine

## 2021-05-31 NOTE — Telephone Encounter (Signed)
Called and spoke with patient was given [name] [name] and unable to take it. States that it makes he nauseous and feels like his throat swells up.  Patient states that he is using Symbicort instead. Also states that he has Duoneb from another provider and is using that not the albuterol nebs that were prescribed. Patient has an upcoming OV on 06/07/21.  Dr. Sherene Sires please advise.

## 2021-05-31 NOTE — Telephone Encounter (Signed)
Called and spoke with Patient.  Dr. Wert's recommendations given. Understanding stated.  Nothing further at this time. 

## 2021-05-31 NOTE — Telephone Encounter (Signed)
Ok to stay on symbicort until ov but bring all meds to visit

## 2021-06-07 ENCOUNTER — Other Ambulatory Visit: Payer: Self-pay

## 2021-06-07 ENCOUNTER — Encounter: Payer: Self-pay | Admitting: Internal Medicine

## 2021-06-07 ENCOUNTER — Ambulatory Visit: Payer: Medicare PPO | Admitting: Internal Medicine

## 2021-06-07 DIAGNOSIS — J9612 Chronic respiratory failure with hypercapnia: Secondary | ICD-10-CM

## 2021-06-07 DIAGNOSIS — J9611 Chronic respiratory failure with hypoxia: Secondary | ICD-10-CM

## 2021-06-07 DIAGNOSIS — J449 Chronic obstructive pulmonary disease, unspecified: Secondary | ICD-10-CM | POA: Diagnosis not present

## 2021-06-07 NOTE — Assessment & Plan Note (Signed)
HCO3    08/2015  =     36   -  04/26/2021   Walked 3lpm   approx   200 ft  @ avg  pace  stopped due to  Sob and sats 89%  ("much more than normal walking" and did not require saba p walked   -  06/07/2021   Walked RA  2 laps @ approx 154ft each @ avg pace  stopped due sob with sats 96%   Advised: Make sure you check your oxygen saturation  at your highest level of activity  to be sure it stays over 90% and adjust  02 flow upward to maintain this level if needed but remember to turn it back to previous settings when you stop (to conserve your supply).   F/u q 3 m  Each maintenance medication was reviewed in detail including emphasizing most importantly the difference between maintenance and prns and under what circumstances the prns are to be triggered using an action plan format where appropriate.  Total time for H and P, chart review, counseling, reviewing hfa/neb/02 device(s) , directly observing portions of ambulatory 02 saturation study/ and generating customized AVS unique to this office visit / same day charting = 32 min

## 2021-06-07 NOTE — Assessment & Plan Note (Signed)
Quit smoking 10/2020  - Spirometry 03/31/20  FEV1 0.29 (8%)  Ratio 0.25 with classic concavity to f/v curve - 04/26/2021  After extensive coaching inhaler device,  effectiveness =    75% from baseline well < 50 (poor trigger coordination, very short Ti)> try breztri and approp saba   > could not tolerate breztri = nausea/ throat irritation but also used duoneb up to 6 x daily while on it  - 06/07/2021 rechallenge with breztri 2 in am/ symbicort 160 2 in pm and use just alb (not atrovent) as prn   Group D in terms of symptom/risk and laba/lama/ICS  therefore appropriate rx at this point >>>  rechallenge with breztri with approp saba  I spent extra time with pt today reviewing appropriate use of albuterol for prn use on exertion with the following points: 1) saba is for relief of sob that does not improve by walking a slower pace or resting but rather if the pt does not improve after trying this first. 2) If the pt is convinced, as many are, that saba helps recover from activity faster then it's easy to tell if this is the case by re-challenging : ie stop, take the inhaler, then p 5 minutes try the exact same activity (intensity of workload) that just caused the symptoms and see if they are substantially diminished or not after saba 3) if there is an activity that reproducibly causes the symptoms, try the saba 15 min before the activity on alternate days   If in fact the saba really does help, then fine to continue to use it prn but advised may need to look closer at the maintenance regimen being used to achieve better control of airways disease with exertion.

## 2021-06-07 NOTE — Patient Instructions (Addendum)
Plan A = Automatic = Always=    Breztri (or Symbicort) 2 pffs first thing in am and then symbicort 160 2 pffs 12 hours later    Work on inhaler technique:  relax and gently blow all the way out then take a nice smooth full deep breath back in, triggering the inhaler at same time you start breathing in.  Hold for up to 5 seconds if you can. Blow out thru nose. Rinse and gargle with water when done.  If mouth or throat bother you at all,  try brushing teeth/gums/tongue with arm and hammer toothpaste/ make a slurry and gargle and spit out.        Plan B = Backup (to supplement plan A, not to replace it) Only use your albuterol nebulizer(or the combination if you are on just symbicort)  as a rescue medication to be used if you can't catch your breath by resting or doing a relaxed purse lip breathing pattern.  - The less you use it, the better it will work when you need it. - Ok to use the inhaler up to every 4 hours if you must but call for appointment if use goes up over your usual need - Don't leave home without it !!  (think of it like the stater fluid/ spare tire for your car)   Ok to try Try albuterol 15 min before an activity (on alternating days)  that you know would usually make you short of breath    Please schedule a follow up visit in 3 months but call sooner if needed

## 2021-06-07 NOTE — Progress Notes (Signed)
[name] [name], male    DOB: [birth year],  MRN: [id number]   Brief patient profile:  80 yowm  quit smoking  10/2020  prev under care of Dr Delton Coombes for GOLD IV copd and 02 dep resp failure/ combined sys/dias chf  referred to pulmonary clinic in [place]  04/26/2021 by Andrey Campanile NP due to difficulty driving to [place]     History of Present Illness  04/26/2021  Pulmonary/ 1st office eval/ Shanira Tine / [place] [facility]  Chief Complaint  Patient presents with   New Patient (Initial Visit)    Patient is here to establish, Patient is on 3 liters all the time, he reports getting short of breath just brushing teeth,   Dyspnea:  25-50 ft uses public transportation pulls up to his door Cough: none  Sleep: on flat bed, on side / one pillow  SABA use: duoneb about 4-5 x daily despite symbicort though only getting maybe half of it in based on poor technique 02  3lpm 24/7  Rec Plan A = Automatic = Always=    Breztri Take 2 puffs first thing in am and then another 2 puffs about 12 hours later.   Work on inhaler technique: Plan B = Backup (to supplement plan A, not to replace it) Only use your albuterol nebulizer as a rescue medication  Make sure you check your oxygen saturation  at your highest level of activity  Ok to try Try albuterol 15 min before an activity (on alternating days)  that you know would make you short of breath Please schedule a follow up office visit in 6 weeks, call sooner if needed with all medications /inhalers/ solutions in hand so we can verify exactly what you are taking. This includes all medications from all doctors and over the counters    06/07/2021  f/u ov/[place] office/Raziah Funnell re: gold iv/ 02 dep  Chief Complaint  Patient presents with   Follow-up    Breathing is at baseline today. He is back on symbicort since the [place] caused his nausea. He is using his Duoneb 4-5 x per day.   Dyspnea:  50 ft Cough: none  Sleeping: flat bed / one pillow s resp cc but uses duoneb 1st  thing in am (not the instructions that were given)   SABA use: flat 02: 3lpm and does not titrate 02 or montior sats  Covid status: x 2      No obvious day to day or daytime variability or assoc excess/ purulent sputum or mucus plugs or hemoptysis or cp or chest tightness, subjective wheeze or overt sinus or hb symptoms.   Sleeping  without nocturnal  or early am exacerbation  of respiratory  c/o's or need for noct saba. Also denies any obvious fluctuation of symptoms with weather or environmental changes or other aggravating or alleviating factors except as outlined above   No unusual exposure hx or h/o childhood pna/ asthma or knowledge of premature birth.  Current Allergies, Complete Past Medical History, Past Surgical History, Family History, and Social History were reviewed in [name].  ROS  The following are not active complaints unless bolded Hoarseness, sore throat, dysphagia, dental problems, itching, sneezing,  nasal congestion or discharge of excess mucus or purulent secretions, ear ache,   fever, chills, sweats, unintended wt loss or wt gain, classically pleuritic or exertional cp,  orthopnea pnd or arm/hand swelling  or leg swelling, presyncope, palpitations, abdominal pain, anorexia, nausea, vomiting, diarrhea  or change in bowel  habits or change in bladder habits, change in stools or change in urine, dysuria, hematuria,  rash, arthralgias, visual complaints, headache, numbness, weakness or ataxia or problems with walking or coordination,  change in mood or  memory.        Current Meds  Medication Sig   budesonide-formoterol (SYMBICORT) 160-4.5 MCG/ACT inhaler Inhale 2 puffs into the lungs 2 (two) times daily.   ipratropium-albuterol (DUONEB) 0.5-2.5 (3) MG/3ML SOLN Inhale 3 mLs into the lungs every 4 (four) hours as needed (for shortness of breath).   levothyroxine (SYNTHROID) 50 MCG tablet Take 50 mcg by mouth daily before breakfast.    rosuvastatin (CRESTOR) 20 MG tablet Take 20 mg by mouth daily.                   Past Medical History:  Diagnosis Date   Abnormal thyroid blood test 09/11/2015   Anxiety and depression    Bipolar disorder (HCC)    BPH (benign prostatic hyperplasia)    CAD (coronary artery disease)    ?MI in 2007   CHF (congestive heart failure) (HCC)    Chronic combined systolic and diastolic CHF (congestive heart failure) (HCC) 12/05/2014   COPD (chronic obstructive pulmonary disease) (HCC)    COPD (chronic obstructive pulmonary disease) (HCC)    Homelessness    Neuropathic pain    Pacemaker    battery replaced 06/2014 at [facility]   Pneumothorax    traumatic 2007   Psychosis [facility])    Severe aortic valve stenosis    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    NECK :  without JVD/Nodes/TM/ nl carotid upstrokes bilaterally   LUNGS: no acc muscle use,  Mod barrel  contour chest wall with bilateral  Distant bs s audible wheeze and  without cough on insp or exp maneuvers and mod  Hyperresonant  to  percussion bilaterally     CV:  RRR  no s3 or murmur or increase in P2, and no edema   ABD:  soft and nontender with pos mid insp Hoover's  in the supine position. No bruits or organomegaly appreciated, bowel sounds nl  MS:     ext warm without deformities, calf tenderness, cyanosis or clubbing No obvious joint restrictions   SKIN: warm and dry without lesions     NEURO:  alert, approp, nl sensorium with  no motor or cerebellar deficits apparent.                 Assessment

## 2021-09-17 ENCOUNTER — Telehealth: Payer: Self-pay | Admitting: Internal Medicine

## 2021-09-18 NOTE — Telephone Encounter (Signed)
Nothing noted in message. Will close encounter.  

## 2021-09-19 ENCOUNTER — Ambulatory Visit: Payer: Medicare PPO | Admitting: Internal Medicine

## 2021-09-21 ENCOUNTER — Other Ambulatory Visit: Payer: Self-pay | Admitting: Internal Medicine

## 2021-10-01 ENCOUNTER — Other Ambulatory Visit: Payer: Self-pay | Admitting: Internal Medicine

## 2021-12-28 ENCOUNTER — Other Ambulatory Visit: Payer: Self-pay | Admitting: Internal Medicine

## 2022-02-08 IMAGING — DX DG CHEST 2V
2 series · 2 of 2 positions shown · non-contrast
Comparison: 09/09/2015 and prior radiographs

CLINICAL DATA: Shortness of breath.

EXAM:
CHEST - 2 VIEW

[chest pa]
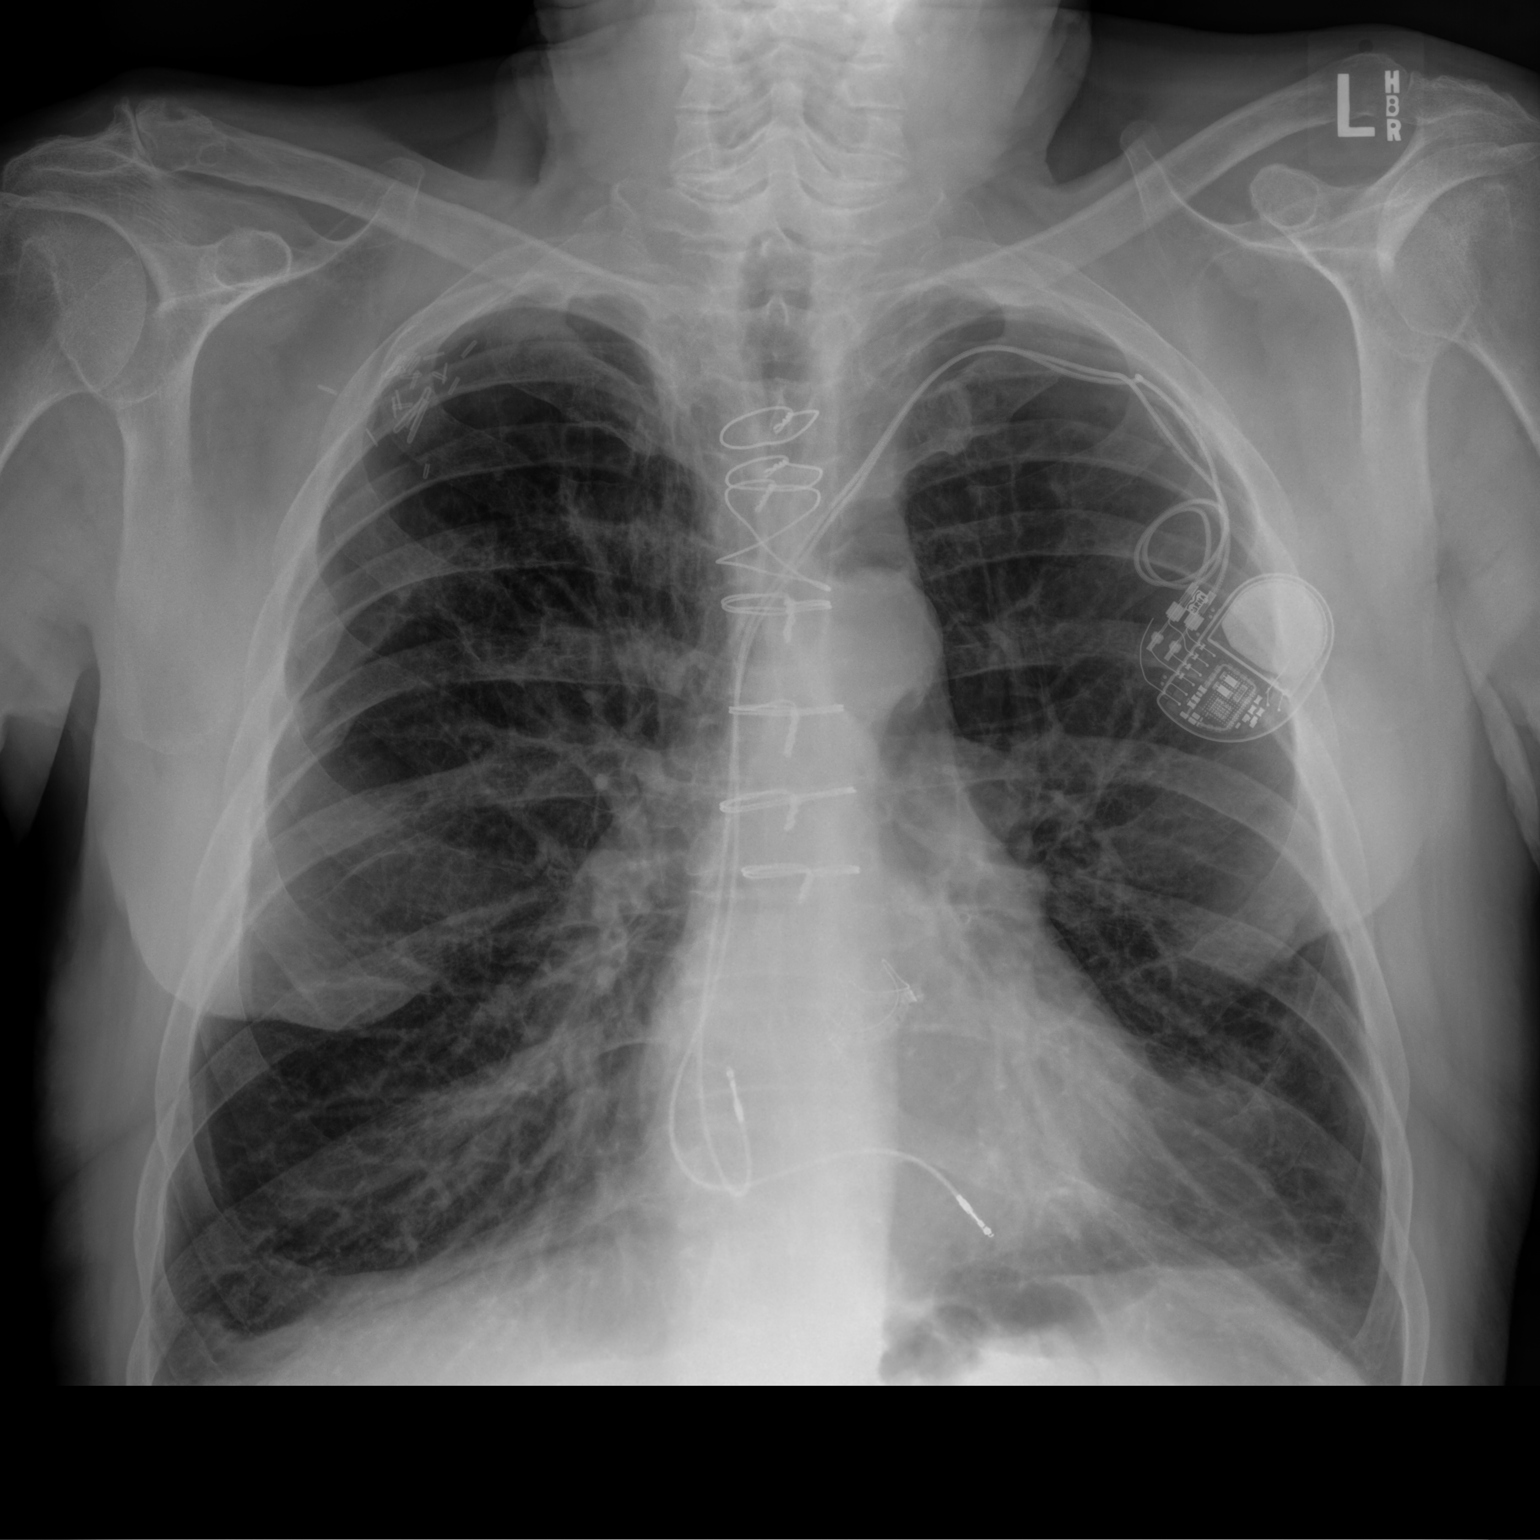

[chest lat]
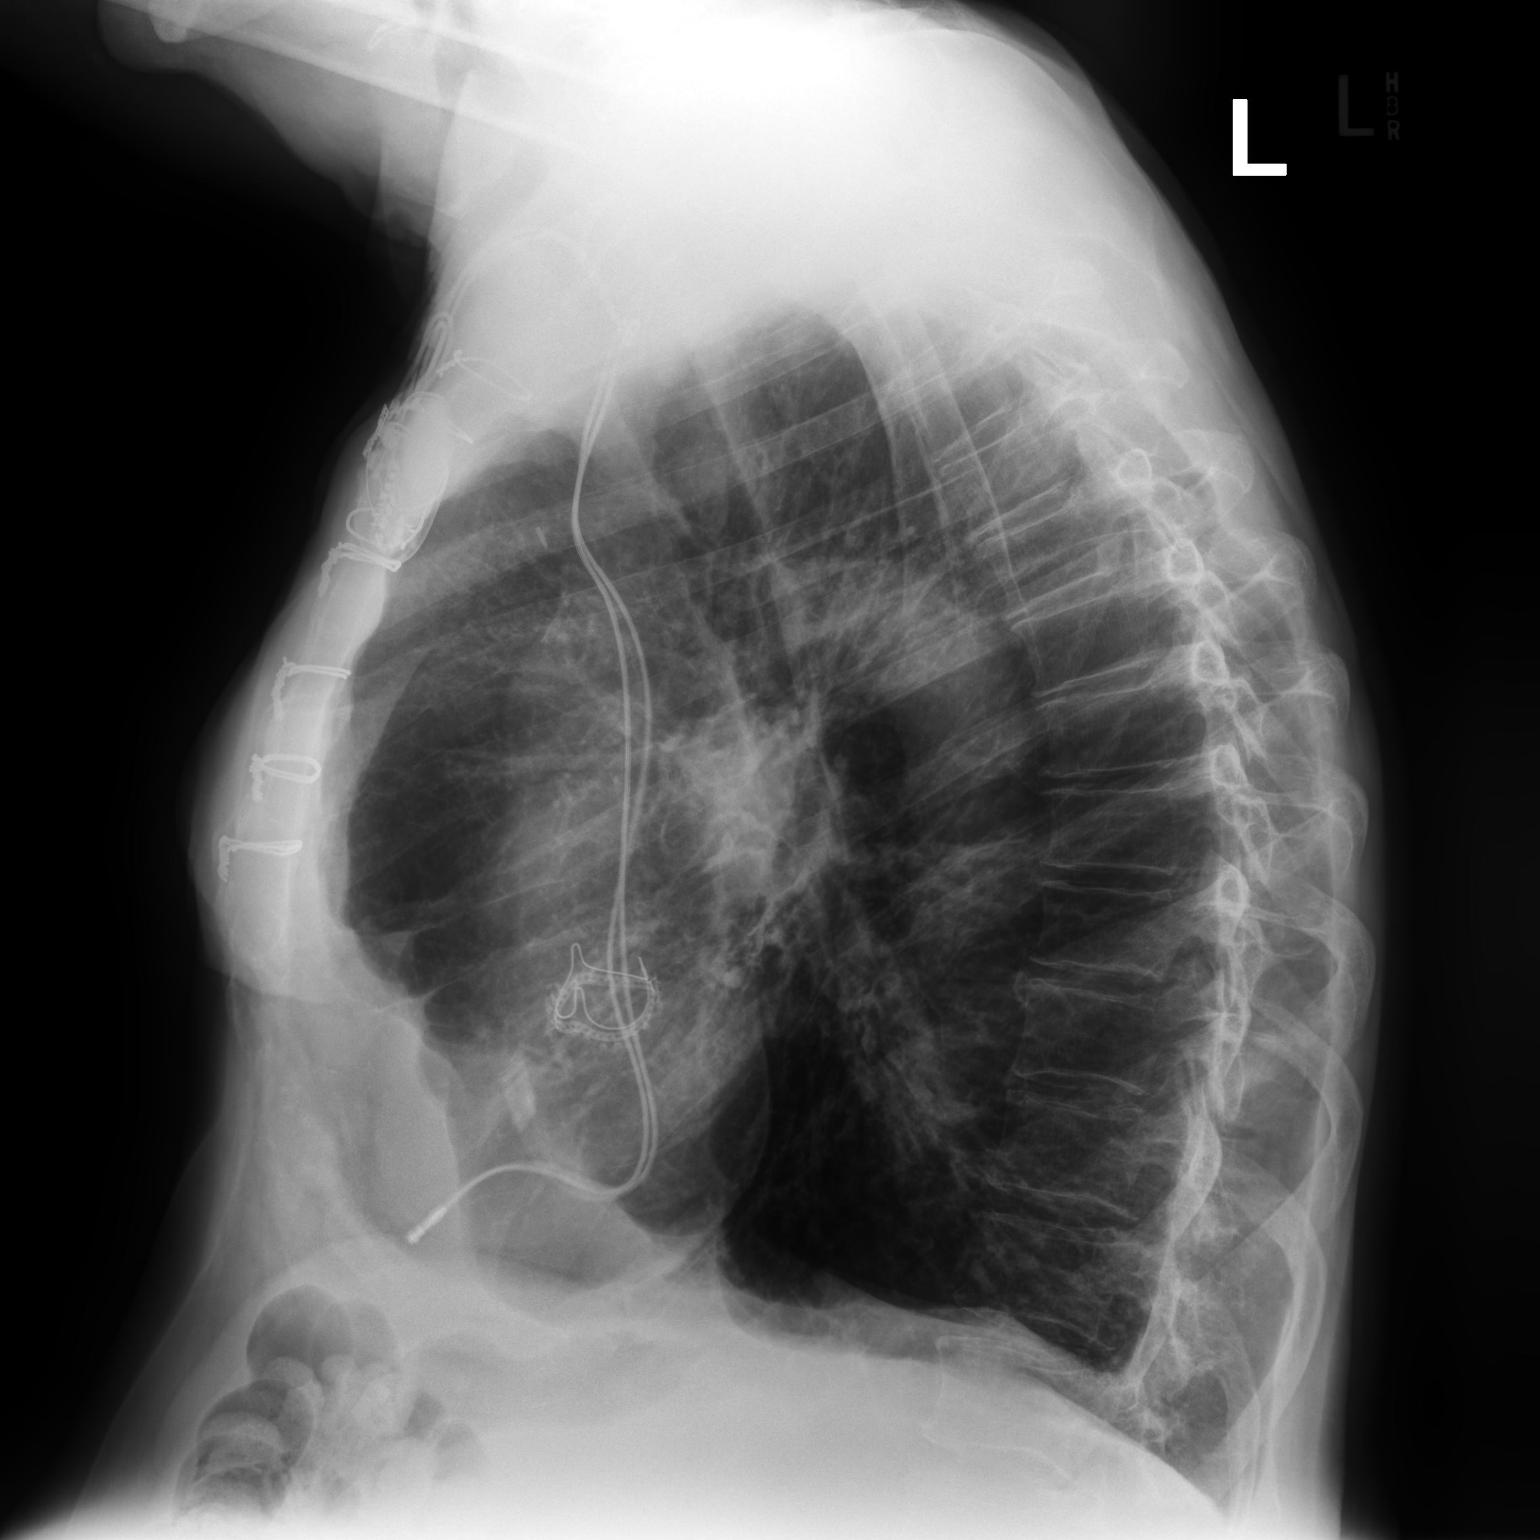

[2 of 2 positions shown; findings below may reference images not displayed]

FINDINGS: Aortic valve replacement and LEFT-sided pacemaker again noted.

COPD/emphysema type changes again noted.

There is no evidence of focal airspace disease, pulmonary edema,
suspicious pulmonary nodule/mass, pleural effusion, or pneumothorax.

No acute bony abnormalities are identified. Surgical clips overlying
the UPPER RIGHT chest again noted.
IMPRESSION: COPD/emphysema without evidence of acute cardiopulmonary disease.

## 2022-03-01 ENCOUNTER — Telehealth: Payer: Self-pay | Admitting: Internal Medicine

## 2022-03-07 NOTE — Telephone Encounter (Signed)
Tomma Lightning called back from [facility] checking the on status of surgical clearance- informed Tomma Lightning patient would need a appointment as he was due back 08/2021. Tomma Lightning states she will inform patient to call to make an appointment.  ?

## 2022-03-07 NOTE — Telephone Encounter (Signed)
Patient has been scheduled for OV ?Next Appt ?With Pulmonology Christinia Gully, MD) ?03/08/2022 at 9:00 AM ?

## 2022-03-08 ENCOUNTER — Ambulatory Visit: Payer: Medicare PPO | Admitting: Internal Medicine

## 2022-03-11 NOTE — Telephone Encounter (Signed)
Next Appt ?With Pulmonology Sandrea Hughs, MD) ?04/16/2022 at 9:00 AM ?

## 2022-04-11 NOTE — Telephone Encounter (Signed)
Called [facility] to let them know we are waiting to clear patient for OV on 6/20 and asked for fax number to send notes to. She states they don't have a clearance form. OV notes will need to be faxed to [phone].

## 2022-04-16 ENCOUNTER — Ambulatory Visit: Payer: Medicare PPO | Admitting: Internal Medicine

## 2022-04-16 ENCOUNTER — Encounter: Payer: Self-pay | Admitting: Internal Medicine

## 2022-04-16 VITALS — BP 128/82 | HR 84 | Temp 97.7°F | Ht 70.0 in | Wt 189.6 lb

## 2022-04-16 DIAGNOSIS — J449 Chronic obstructive pulmonary disease, unspecified: Secondary | ICD-10-CM

## 2022-04-16 DIAGNOSIS — J9611 Chronic respiratory failure with hypoxia: Secondary | ICD-10-CM | POA: Diagnosis not present

## 2022-04-16 DIAGNOSIS — J9612 Chronic respiratory failure with hypercapnia: Secondary | ICD-10-CM

## 2022-04-16 DIAGNOSIS — Z720 Tobacco use: Secondary | ICD-10-CM | POA: Diagnosis not present

## 2022-04-16 MED ORDER — ALBUTEROL SULFATE HFA 108 (90 BASE) MCG/ACT IN AERS: INHALATION_SPRAY | RESPIRATORY_TRACT | 11 refills | Status: AC

## 2022-04-16 MED ORDER — PULSE OXIMETER MISC: 1.0000 | Freq: Every day | 0 refills | Status: AC | PRN

## 2022-04-16 NOTE — Progress Notes (Unsigned)
[name] [name], male    DOB: October 28, 1946,  MRN: [id number]   Brief patient profile:  84  yowm  quit smoking  10/2020  prev under care of Dr Delton Coombes for GOLD IV copd and 02 dep resp failure/ combined sys/dias chf  referred to pulmonary clinic in [place]  04/26/2021 by Andrey Campanile NP due to difficulty driving to [place]     History of Present Illness  04/26/2021  Pulmonary/ 1st office eval/ Donal Lynam / [place] [facility]  Chief Complaint  Patient presents with   New Patient (Initial Visit)    Patient is here to establish, Patient is on 3 liters all the time, he reports getting short of breath just brushing teeth,   Dyspnea:  25-50 ft uses public transportation pulls up to his door Cough: none  Sleep: on flat bed, on side / one pillow  SABA use: duoneb about 4-5 x daily despite symbicort though only getting maybe half of it in based on poor technique 02  3lpm 24/7  Rec Plan A = Automatic = Always=    Breztri Take 2 puffs first thing in am and then another 2 puffs about 12 hours later.   Work on inhaler technique: Plan B = Backup (to supplement plan A, not to replace it) Only use your albuterol nebulizer as a rescue medication  Make sure you check your oxygen saturation  at your highest level of activity  Ok to try Try albuterol 15 min before an activity (on alternating days)  that you know would make you short of breath Please schedule a follow up office visit in 6 weeks, call sooner if needed with all medications /inhalers/ solutions in hand so we can verify exactly what you are taking. This includes all medications from all doctors and over the counters    06/07/2021  f/u ov/[facility] office/Maryssa Giampietro re: gold iv/ 02 dep  Chief Complaint  Patient presents with   Follow-up    Breathing is at baseline today. He is back on symbicort since the [place] caused his nausea. He is using his Duoneb 4-5 x per day.   Dyspnea:  50 ft Cough: none  Sleeping: flat bed / one pillow s resp cc but uses duoneb 1st  thing in am (not the instructions that were given)   SABA use: flat 02: 3lpm and does not titrate 02 or montior sats  Covid status: x 2  Rec Plan A = Automatic = Always=    Breztri (or Symbicort) 2 pffs first thing in am and then symbicort 160 2 pffs 12 hours later  Work on inhaler technique:   Plan B = Backup (to supplement plan A, not to replace it) Only use your albuterol nebulizer (or the combination if you are on just symbicort)  as a rescue medication  Ok to try Try albuterol 15 min before an activity (on alternating days)  that you know would usually make you short of breath   Please schedule a follow up visit in 3 months but call sooner if needed    04/16/2022  f/u ov/[facility] office/Hiromi Knodel re: GOLD 4/ on hs and prn daytime 02  maint on symbicort/ duoneb  and breztri /neb alb   Chief Complaint  Patient presents with   Follow-up    Surg clearance for ENT  [facility] for sinus  Patient states his breathing has improved a little  since he stopped smoking.    Dyspnea:  much better / able to [employer] on 2lpm  Cough: not problem  Sleeping: no problem flat bed/ one pillow  SABA use: still using neb twice daily and p ex  02:  hs 2lpm but not checking sats with ex Lung cancer screening: referred    No obvious day to day or daytime variability or assoc excess/ purulent sputum or mucus plugs or hemoptysis or cp or chest tightness, subjective wheeze or overt sinus or hb symptoms.   Sleeping  without nocturnal  or early am exacerbation  of respiratory  c/o's or need for noct saba. Also denies any obvious fluctuation of symptoms with weather or environmental changes or other aggravating or alleviating factors except as outlined above   No unusual exposure hx or h/o childhood pna/ asthma or knowledge of premature birth.  Current Allergies, Complete Past Medical History, Past Surgical History, Family History, and Social History were reviewed in [name].  ROS   The following are not active complaints unless bolded Hoarseness, sore throat, dysphagia, dental problems, itching, sneezing,  nasal congestion or discharge of excess mucus or purulent secretions, ear ache,   fever, chills, sweats, unintended wt loss or wt gain, classically pleuritic or exertional cp,  orthopnea pnd or arm/hand swelling  or leg swelling, presyncope, palpitations, abdominal pain, anorexia, nausea, vomiting, diarrhea  or change in bowel habits or change in bladder habits, change in stools or change in urine, dysuria, hematuria,  rash, arthralgias, visual complaints, headache, numbness, weakness or ataxia or problems with walking or coordination,  change in mood or  memory.        Current Meds  Medication Sig   albuterol (PROVENTIL) (2.5 MG/3ML) 0.083% nebulizer solution USE 1 VIAL IN NEBULIZER EVERY 6 HOURS AS NEEDED FOR WHEEZING OR SHORTNESS OF BREATH.   BREZTRI AEROSPHERE 160-9-4.8 MCG/ACT AERO Inhale into the lungs.   budesonide-formoterol (SYMBICORT) 160-4.5 MCG/ACT inhaler Inhale 2 puffs into the lungs 2 (two) times daily.   ipratropium-albuterol (DUONEB) 0.5-2.5 (3) MG/3ML SOLN Inhale 3 mLs into the lungs every 4 (four) hours as needed (for shortness of breath).   levothyroxine (SYNTHROID) 50 MCG tablet Take 50 mcg by mouth daily before breakfast.   rosuvastatin (CRESTOR) 20 MG tablet Take 20 mg by mouth daily.                 Past Medical History:  Diagnosis Date   Abnormal thyroid blood test 09/11/2015   Anxiety and depression    Bipolar disorder (HCC)    BPH (benign prostatic hyperplasia)    CAD (coronary artery disease)    ?MI in 2007   CHF (congestive heart failure) (HCC)    Chronic combined systolic and diastolic CHF (congestive heart failure) (HCC) 12/05/2014   COPD (chronic obstructive pulmonary disease) (HCC)    COPD (chronic obstructive pulmonary disease) (HCC)    Homelessness    Neuropathic pain    Pacemaker    battery replaced 06/2014 at [facility]    Pneumothorax    traumatic 2007   Psychosis [facility])    Severe aortic valve stenosis    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 at [facility]   SSS (sick sinus syndrome) (HCC)    s/p PPM    Thoracic ascending aortic aneurysm (HCC)    repaired 06/2014 at [facility]   Tobacco abuse          Objective:      04/16/2022       189   06/07/21 184 lb 3.2 oz (83.6 kg)  04/26/21 188 lb 3.7 oz (85.4 kg)  03/31/20 180 lb (  81.6 kg)    Vital signs reviewed  04/16/2022  - Note at rest 02 sats  98% on 3lpm cont   General appearance:    amb slt hoarse wm nad    HEENT :  Oropharynx  clear   Nasal turbinates mod edema    NECK :  without JVD/Nodes/TM/ nl carotid upstrokes bilaterally   LUNGS: no acc muscle use,  Mod barrel  contour chest wall with bilateral  Distant bs s audible wheeze and  without cough on insp or exp maneuvers and mod  Hyperresonant  to  percussion bilaterally     CV:  RRR  no s3 or murmur or increase in P2, and  trace to 1 + pitting  ABD:  soft and nontender with pos mid insp Hoover's  in the supine position. No bruits or organomegaly appreciated, bowel sounds nl  MS:   Ext warm without deformities or   obvious joint restrictions , calf tenderness, cyanosis or clubbing  SKIN: warm and dry without lesions    NEURO:  alert, approp, nl sensorium with  no motor or cerebellar deficits apparent.                        Assessment

## 2022-04-16 NOTE — Patient Instructions (Addendum)
Plan A = Automatic = Always=    Symbicort 2 puffs first thing in am and Breztri 2 puffs 12 hours   Plan B = Backup (to supplement plan A, not to replace it) Only use your albuterol inhaler as a rescue medication to be used if you can't catch your breath by resting or doing a relaxed purse lip breathing pattern.  - The less you use it, the better it will work when you need it. - Ok to use the inhaler up to 2 puffs  every 4 hours if you must but call for appointment if use goes up over your usual need - Don't leave home without it !!  (think of it like the spare tire for your car)   Plan C = Crisis (instead of Plan B but only if Plan B stops working) - only use your albuterol nebulizer (with ipatropium if no breztriif you first try Plan B and it fails to help > ok to use the nebulizer up to every 4 hours but if start needing it regularly call for immediate appointment    Ok to try albuterol 15 min before an activity (on alternating days)  that you know would usually make you short of breath and see if it makes any difference and if makes none then don't take albuterol after activity unless you can't catch your breath as this means it's the resting that helps, not the albuterol.      Make sure you check your oxygen saturation  AT  your highest level of activity (not after you stop)   to be sure it stays over 90% and adjust  02 flow upward to maintain this level if needed but remember to turn it back to previous settings when you stop (to conserve your supply).   You are cleared for sinus surgery   My office will be contacting you by phone for referral to lung cancer screening program    Please schedule a follow up visit in 6  months but call sooner if needed

## 2022-04-16 NOTE — Telephone Encounter (Signed)
OV notes from ov on 04/16/2022 with Dr. Sherene Sires clearing patient for surgery faxed to [facility] [phone]. Nothing further needed.

## 2022-04-17 ENCOUNTER — Encounter: Payer: Self-pay | Admitting: Internal Medicine

## 2022-04-17 NOTE — Assessment & Plan Note (Signed)
Quit smoking 01/2022  - Spirometry 03/31/20  FEV1 0.29 (8%)  Ratio 0.25 with classic concavity to f/v curve - 04/26/2021  After extensive coaching inhaler device,  effectiveness =    75% from baseline well < 50 (poor trigger coordination, very short Ti)> try breztri and approp saba   > could not tolerate breztri = nausea/ throat irritation but also used duoneb up to 6 x daily while on it  - 06/07/2021 rechallenge with breztri 2 in am/ symbicort 160 2 in pm and use just alb (not atrovent) as prn - 04/16/2022  After extensive coaching inhaler device,  effectiveness =    80% hfa    Group D (now reclassified as E) in terms of symptom/risk and laba/lama/ICS  therefore appropriate rx at this point >>>  breztri 2 bid but prefers just using the breztri pm and continuing symbicort am   >> advised re approp saba use: Re SABA :  I spent extra time with pt today reviewing appropriate use of albuterol for prn use on exertion with the following points: 1) saba is for relief of sob that does not improve by walking a slower pace or resting but rather if the pt does not improve after trying this first. 2) If the pt is convinced, as many are, that saba helps recover from activity faster then it's easy to tell if this is the case by re-challenging : ie stop, take the inhaler, then p 5 minutes try the exact same activity (intensity of workload) that just caused the symptoms and see if they are substantially diminished or not after saba 3) if there is an activity that reproducibly causes the symptoms, try the saba 15 min before the activity on alternate days   If in fact the saba really does help, then fine to continue to use it prn but advised may need to look closer at the maintenance regimen being used to achieve better control of airways disease with exertion.

## 2022-04-17 NOTE — Assessment & Plan Note (Signed)
HCO3    08/2015  =     36   -  04/26/2021   Walked 3lpm   approx   200 ft  @ avg  pace  stopped due to  Sob and sats 89%  ("much more than normal walking" and did not require saba p walked   -  06/07/2021   Walked RA  2 laps @ approx 160ft each @ avg pace  stopped due sob with sats 96%   Again advised: Make sure you check your oxygen saturation  AT  your highest level of activity (not after you stop)   to be sure it stays over 90% and adjust  02 flow upward to maintain this level if needed but remember to turn it back to previous settings when you stop (to conserve your supply).    Cleared for sinus surgery as he is relatively stable state and has improved some since stopping smoking  Discussed in detail all the  indications, usual  risks and alternatives  relative to the benefits with patient who agrees to proceed with Rx as outlined.             Each maintenance medication was reviewed in detail including emphasizing most importantly the difference between maintenance and prns and under what circumstances the prns are to be triggered using an action plan format where appropriate.  Total time for H and P, chart review, counseling, reviewing hfa/neb/02 device(s) and generating customized AVS unique to this office visit / same day charting =  > 30 min

## 2022-05-15 ENCOUNTER — Other Ambulatory Visit: Payer: Self-pay | Admitting: Internal Medicine

## 2022-05-27 ENCOUNTER — Telehealth: Payer: Self-pay | Admitting: Internal Medicine

## 2022-05-27 NOTE — Telephone Encounter (Signed)
Patient is referring to Lung Cancer Screening. He states he has not heard from anyone regarding scheduling. Please advise. Thanks!

## 2022-05-28 ENCOUNTER — Other Ambulatory Visit: Payer: Self-pay

## 2022-05-28 DIAGNOSIS — Z87891 Personal history of nicotine dependence: Secondary | ICD-10-CM

## 2022-05-28 DIAGNOSIS — Z122 Encounter for screening for malignant neoplasm of respiratory organs: Secondary | ICD-10-CM

## 2022-05-28 NOTE — Telephone Encounter (Signed)
Karlton Lemon, RN Spoke with patient Scheduled SD[facility] 07/03/22 CT scheduled 07/04/22

## 2022-05-29 ENCOUNTER — Other Ambulatory Visit: Payer: Self-pay | Admitting: Internal Medicine

## 2022-06-27 ENCOUNTER — Telehealth: Payer: Self-pay | Admitting: Internal Medicine

## 2022-06-27 MED ORDER — BREZTRI AEROSPHERE 160-9-4.8 MCG/ACT IN AERO: INHALATION_SPRAY | RESPIRATORY_TRACT | 3 refills | Status: DC

## 2022-06-27 NOTE — Addendum Note (Signed)
Addended by: Carleene Mains D on: 06/27/2022 11:57 AM   Modules accepted: Orders

## 2022-06-27 NOTE — Telephone Encounter (Signed)
Pt unable to take Breztri twice a day due to side effects.  Takes once a day with Symbicort at night.  Needs new prescription for Breztri.  Pt out.  [facility]   Dr. Sherene Sires, please advise, are you okay with patient taking inhalers this way before we send refill?  Last AVS note:  "Plan A = Automatic = Always=    Symbicort 2 puffs first thing in am and Breztri 2 puffs 12 hours"

## 2022-06-27 NOTE — Telephone Encounter (Signed)
That's fine until returns but when does so bring all resp neds with him

## 2022-06-27 NOTE — Telephone Encounter (Signed)
Refill sent.  Nothing further needed  

## 2022-06-28 ENCOUNTER — Telehealth: Payer: Self-pay | Admitting: Internal Medicine

## 2022-06-28 DIAGNOSIS — J9611 Chronic respiratory failure with hypoxia: Secondary | ICD-10-CM

## 2022-06-28 NOTE — Telephone Encounter (Signed)
The patient called and was told by [name] that an order would be needed for him to go in their office to qualify for a POC. He was last seen in June 2023. Please advise if he needs an OV or if he would need to come in for a walk?

## 2022-07-02 NOTE — Telephone Encounter (Signed)
Ok to order for 3lpm POC for air travel purposes

## 2022-07-02 NOTE — Telephone Encounter (Addendum)
Order was sent and pt made aware  Nothing further needed

## 2022-07-03 ENCOUNTER — Encounter: Payer: Self-pay | Admitting: Acute Care

## 2022-07-03 ENCOUNTER — Ambulatory Visit (INDEPENDENT_AMBULATORY_CARE_PROVIDER_SITE_OTHER): Payer: Medicare PPO | Admitting: Acute Care

## 2022-07-03 DIAGNOSIS — Z87891 Personal history of nicotine dependence: Secondary | ICD-10-CM | POA: Diagnosis not present

## 2022-07-03 NOTE — Progress Notes (Signed)
Virtual Visit via Telephone Note  I connected with [name] [name] on 07/03/22 at  9:00 AM EDT by telephone and verified that I am speaking with the correct person using two identifiers.  Location: Patient:  at home Provider: [address], [place], [place], [address]    I discussed the limitations, risks, security and privacy concerns of performing an evaluation and management service by telephone and the availability of in person appointments. I also discussed with the patient that there may be a patient responsible charge related to this service. The patient expressed understanding and agreed to proceed.    Shared Decision Making Visit Lung Cancer Screening Program [phone])   Eligibility: Age 76 y.o. Pack Years Smoking History Calculation 43 pack years (# packs/per year x # years smoked) Recent History of coughing up blood  no Unexplained weight loss? no ( >Than 15 pounds within the last 6 months ) Prior History Lung / other cancer no (Diagnosis within the last 5 years already requiring surveillance chest CT Scans). Smoking Status Former Smoker Former Smokers: Years since quit:  NA  Quit Date: 01/2022  Visit Components: Discussion included one or more decision making aids. yes Discussion included risk/benefits of screening. yes Discussion included potential follow up diagnostic testing for abnormal scans. yes Discussion included meaning and risk of over diagnosis. yes Discussion included meaning and risk of False Positives. yes Discussion included meaning of total radiation exposure. yes  Counseling Included: Importance of adherence to annual lung cancer LDCT screening. yes Impact of comorbidities on ability to participate in the program. yes Ability and willingness to under diagnostic treatment. yes  Smoking Cessation Counseling: Current Smokers:  Discussed importance of smoking cessation. yes Information about tobacco cessation classes and interventions provided  to patient. yes Patient provided with "ticket" for LDCT Scan. yes Symptomatic Patient. no  Counseling NA Diagnosis Code: Tobacco Use Z72.0 Asymptomatic Patient yes  Counseling (Intermediate counseling: > three minutes counseling) [id number] Former Smokers:  Discussed the importance of maintaining cigarette abstinence. yes Diagnosis Code: Personal History of Nicotine Dependence. O27.035 Information about tobacco cessation classes and interventions provided to patient. Yes Patient provided with "ticket" for LDCT Scan. yes Written Order for Lung Cancer Screening with LDCT placed in Epic. Yes (CT Chest Lung Cancer Screening Low Dose W/O CM) [id number] Z12.2-Screening of respiratory organs Z87.891-Personal history of nicotine dependence  I spent 25 minutes of face to face time/virtual visit time  with  [name] [name] discussing the risks and benefits of lung cancer screening. We took the time to pause the power point at intervals to allow for questions to be asked and answered to ensure understanding. We discussed that he had taken the single most powerful action possible to decrease his risk of developing lung cancer when he quit smoking. I counseled him to remain smoke free, and to contact me if he ever had the desire to smoke again so that I can provide resources and tools to help support the effort to remain smoke free. We discussed the time and location of the scan, and that either  Abigail Miyamoto RN, Karlton Lemon, RN or I  or I will call / send a letter with the results within  24-72 hours of receiving them. He has the office contact information in the event he needs to speak with me,  he verbalized understanding of all of the above and had no further questions upon leaving the office.     I explained to the patient that there has been  a high incidence of coronary artery disease noted on these exams. I explained that this is a non-gated exam therefore degree or severity cannot be determined. This patient  is on statin therapy. I have asked the patient to follow-up with their PCP regarding any incidental finding of coronary artery disease and management with diet or medication as they feel is clinically indicated. The patient verbalized understanding of the above and had no further questions.     Bevelyn Ngo, NP 07/03/2022

## 2022-07-03 NOTE — Patient Instructions (Signed)
Thank you for participating in the [facility] Cancer Screening Program. It was our pleasure to meet you today. We will call you with the results of your scan within the next few days. Your scan will be assigned a Lung RADS category score by the physicians reading the scans.  This Lung RADS score determines follow up scanning.  See below for description of categories, and follow up screening recommendations. We will be in touch to schedule your follow up screening annually or based on recommendations of our providers. We will fax a copy of your scan results to your Primary Care Physician, or the physician who referred you to the program, to ensure they have the results. Please call the office if you have any questions or concerns regarding your scanning experience or results.  Our office number is [phone]. Please speak with Denise Phelps, RN. , or  Denise Buckner RN, They are  our Lung Cancer Screening RN.'s If They are unavailable when you call, Please leave a message on the voice mail. We will return your call at our earliest convenience.This voice mail is monitored several times a day.  Remember, if your scan is normal, we will scan you annually as long as you continue to meet the criteria for the program. (Age 55-77, Current smoker or smoker who has quit within the last 15 years). If you are a smoker, remember, quitting is the single most powerful action that you can take to decrease your risk of lung cancer and other pulmonary, breathing related problems. We know quitting is hard, and we are here to help.  Please let us know if there is anything we can do to help you meet your goal of quitting. If you are a former smoker, congratulations. We are proud of you! Remain smoke free! Remember you can refer friends or family members through the number above.  We will screen them to make sure they meet criteria for the program. Thank you for helping us take better care of you by  participating in Lung Screening.  You can receive free nicotine replacement therapy ( patches, gum or mints) by calling 1-800-QUIT NOW. Please call so we can get you on the path to becoming  a non-smoker. I know it is hard, but you can do this!  Lung RADS Categories:  Lung RADS 1: no nodules or definitely non-concerning nodules.  Recommendation is for a repeat annual scan in 12 months.  Lung RADS 2:  nodules that are non-concerning in appearance and behavior with a very low likelihood of becoming an active cancer. Recommendation is for a repeat annual scan in 12 months.  Lung RADS 3: nodules that are probably non-concerning , includes nodules with a low likelihood of becoming an active cancer.  Recommendation is for a 6-month repeat screening scan. Often noted after an upper respiratory illness. We will be in touch to make sure you have no questions, and to schedule your 6-month scan.  Lung RADS 4 A: nodules with concerning findings, recommendation is most often for a follow up scan in 3 months or additional testing based on our provider's assessment of the scan. We will be in touch to make sure you have no questions and to schedule the recommended 3 month follow up scan.  Lung RADS 4 B:  indicates findings that are concerning. We will be in touch with you to schedule additional diagnostic testing based on our provider's  assessment of the scan.  Other options for assistance in smoking cessation (   As covered by your insurance benefits)  Hypnosis for smoking cessation  [employer] [phone]  Acupuncture for smoking cessation  [facility] [phone]   

## 2022-07-04 ENCOUNTER — Ambulatory Visit (HOSPITAL_COMMUNITY)
Admission: RE | Admit: 2022-07-04 | Discharge: 2022-07-04 | Disposition: A | Discharge status: Home / Self Care | Payer: Medicare PPO | Source: Ambulatory Visit | Attending: Acute Care | Admitting: Acute Care

## 2022-07-04 DIAGNOSIS — I700 Atherosclerosis of aorta: Secondary | ICD-10-CM | POA: Diagnosis not present

## 2022-07-04 DIAGNOSIS — Z122 Encounter for screening for malignant neoplasm of respiratory organs: Secondary | ICD-10-CM | POA: Diagnosis not present

## 2022-07-04 DIAGNOSIS — Z87891 Personal history of nicotine dependence: Secondary | ICD-10-CM | POA: Insufficient documentation

## 2022-07-04 DIAGNOSIS — J439 Emphysema, unspecified: Secondary | ICD-10-CM | POA: Insufficient documentation

## 2022-07-05 ENCOUNTER — Other Ambulatory Visit: Payer: Self-pay

## 2022-07-05 DIAGNOSIS — Z87891 Personal history of nicotine dependence: Secondary | ICD-10-CM

## 2022-07-05 DIAGNOSIS — Z122 Encounter for screening for malignant neoplasm of respiratory organs: Secondary | ICD-10-CM

## 2022-08-09 ENCOUNTER — Other Ambulatory Visit: Payer: Self-pay | Admitting: *Deleted

## 2022-08-09 MED ORDER — BREZTRI AEROSPHERE 160-9-4.8 MCG/ACT IN AERO: INHALATION_SPRAY | RESPIRATORY_TRACT | 5 refills | Status: AC

## 2022-09-05 ENCOUNTER — Telehealth: Payer: Self-pay | Admitting: Internal Medicine

## 2022-09-05 DIAGNOSIS — J449 Chronic obstructive pulmonary disease, unspecified: Secondary | ICD-10-CM

## 2022-09-05 DIAGNOSIS — J9611 Chronic respiratory failure with hypoxia: Secondary | ICD-10-CM

## 2022-09-05 NOTE — Telephone Encounter (Signed)
Called and spoke to patient he states he needs a new order sent to [facility] For O2 supplies. Patient is currently using [facility] but [facility]  is no longer covering [facility] effective 10/27/22. Patient aware we are placing order to have his DMEs changed.    Patient would like to ensure that he would not need to pay anything from now to 12/31. Placed this in order so [facility] will be aware

## 2022-09-17 ENCOUNTER — Telehealth: Payer: Self-pay | Admitting: Internal Medicine

## 2022-09-17 NOTE — Telephone Encounter (Signed)
Called and spoke with (?) at [facility]. He states there has not been an order faxed over from [place] that he has seen. He recommended that we refax. Gave two numbers to try: [phone] or [phone].  Order faxed. Nothing further needed

## 2022-09-17 NOTE — Telephone Encounter (Signed)
Patient called to request an order be sent to [facility] for a portable oxygen concentrator.  Please call patient to discuss further.  CB# [phone]

## 2022-09-17 NOTE — Telephone Encounter (Signed)
Called and spoke to patient.  Patient states he is switching DME because of insurance changes.  And needs order for DME to go to [facility]. Notified him that order was already sent to [facility]. ([facility]) and I would send a message to [facility] to check on order.

## 2022-10-04 ENCOUNTER — Telehealth: Payer: Self-pay | Admitting: Internal Medicine

## 2022-10-04 NOTE — Telephone Encounter (Signed)
I will resend this order

## 2022-10-04 NOTE — Telephone Encounter (Signed)
This has already been ordered. PCCs can you follow up that this has been faxed/ refax? It looks like confirmation was received from [facility].

## 2022-10-04 NOTE — Telephone Encounter (Signed)
Order has been refaxed and confirmation recived

## 2022-10-09 ENCOUNTER — Telehealth: Payer: Self-pay | Admitting: *Deleted

## 2022-10-09 DIAGNOSIS — J449 Chronic obstructive pulmonary disease, unspecified: Secondary | ICD-10-CM

## 2022-10-09 NOTE — Telephone Encounter (Signed)
Called and spoke to [facility] they advised they needed a new o2 order sent with correct information. Order has been placed. Called pt and advised I have resubmitted order and someone from [facility] would give him a call. Pt verbalized understanding. Nothing further needed.

## 2022-10-11 ENCOUNTER — Emergency Department (HOSPITAL_COMMUNITY)
Admission: EM | Admit: 2022-10-11 | Discharge: 2022-10-11 | Disposition: A | Discharge status: Home / Self Care | Payer: Medicare PPO | Attending: Emergency Medicine | Admitting: Emergency Medicine

## 2022-10-11 ENCOUNTER — Emergency Department (HOSPITAL_COMMUNITY): Payer: Medicare PPO

## 2022-10-11 DIAGNOSIS — J441 Chronic obstructive pulmonary disease with (acute) exacerbation: Secondary | ICD-10-CM

## 2022-10-11 DIAGNOSIS — D72829 Elevated white blood cell count, unspecified: Secondary | ICD-10-CM | POA: Insufficient documentation

## 2022-10-11 DIAGNOSIS — R0602 Shortness of breath: Secondary | ICD-10-CM | POA: Diagnosis present

## 2022-10-11 DIAGNOSIS — Z7951 Long term (current) use of inhaled steroids: Secondary | ICD-10-CM | POA: Diagnosis not present

## 2022-10-11 LAB — CBC WITH DIFFERENTIAL/PLATELET
Abs Immature Granulocytes: 0.06 10*3/uL (ref 0.00–0.07)
Basophils Absolute: 0.1 10*3/uL (ref 0.0–0.1)
Basophils Relative: 0 %
Eosinophils Absolute: 0.1 10*3/uL (ref 0.0–0.5)
Eosinophils Relative: 1 %
HCT: 42.4 % (ref 39.0–52.0)
Hemoglobin: 13.3 g/dL (ref 13.0–17.0)
Immature Granulocytes: 1 %
Lymphocytes Relative: 12 %
Lymphs Abs: 1.5 10*3/uL (ref 0.7–4.0)
MCH: 31.7 pg (ref 26.0–34.0)
MCHC: 31.4 g/dL (ref 30.0–36.0)
MCV: 101.2 fL — ABNORMAL HIGH (ref 80.0–100.0)
Monocytes Absolute: 0.6 10*3/uL (ref 0.1–1.0)
Monocytes Relative: 5 %
Neutro Abs: 10.1 10*3/uL — ABNORMAL HIGH (ref 1.7–7.7)
Neutrophils Relative %: 81 %
Platelets: 185 10*3/uL (ref 150–400)
RBC: 4.19 MIL/uL — ABNORMAL LOW (ref 4.22–5.81)
RDW: 13.2 % (ref 11.5–15.5)
WBC: 12.3 10*3/uL — ABNORMAL HIGH (ref 4.0–10.5)
nRBC: 0 % (ref 0.0–0.2)

## 2022-10-11 LAB — COMPREHENSIVE METABOLIC PANEL
ALT: 29 U/L (ref 0–44)
AST: 27 U/L (ref 15–41)
Albumin: 3.7 g/dL (ref 3.5–5.0)
Alkaline Phosphatase: 66 U/L (ref 38–126)
Anion gap: 6 (ref 5–15)
BUN: 12 mg/dL (ref 8–23)
CO2: 35 mmol/L — ABNORMAL HIGH (ref 22–32)
Calcium: 9.1 mg/dL (ref 8.9–10.3)
Chloride: 98 mmol/L (ref 98–111)
Creatinine, Ser: 0.85 mg/dL (ref 0.61–1.24)
GFR, Estimated: >60 mL/min (ref >60)
Glucose, Bld: 112 mg/dL — ABNORMAL HIGH (ref 70–99)
Potassium: 4.8 mmol/L (ref 3.5–5.1)
Sodium: 139 mmol/L (ref 135–145)
Total Bilirubin: 0.6 mg/dL (ref 0.3–1.2)
Total Protein: 6.7 g/dL (ref 6.5–8.1)

## 2022-10-11 MED ORDER — ALBUTEROL SULFATE HFA 108 (90 BASE) MCG/ACT IN AERS: 2.0000 | INHALATION_SPRAY | RESPIRATORY_TRACT | Status: DC | PRN

## 2022-10-11 MED ORDER — IPRATROPIUM-ALBUTEROL 0.5-2.5 (3) MG/3ML IN SOLN: 3.0000 mL | Freq: Once | RESPIRATORY_TRACT | Status: DC

## 2022-10-11 MED ORDER — MAGNESIUM SULFATE 2 GM/50ML IV SOLN
2.0000 g | Freq: Once | INTRAVENOUS | Status: AC
  Administered 2022-10-11: 2 g via INTRAVENOUS
  Filled 2022-10-11: qty 50

## 2022-10-11 MED ORDER — PREDNISONE 10 MG PO TABS: 20.0000 mg | ORAL_TABLET | Freq: Every day | ORAL | 0 refills | Status: AC

## 2022-10-11 MED ORDER — METHYLPREDNISOLONE SODIUM SUCC 125 MG IJ SOLR: 125.0000 mg | Freq: Once | INTRAMUSCULAR | Status: DC

## 2022-10-11 MED ORDER — LEVOFLOXACIN 500 MG PO TABS: 500.0000 mg | ORAL_TABLET | Freq: Every day | ORAL | 0 refills | Status: AC

## 2022-10-11 MED ORDER — IPRATROPIUM-ALBUTEROL 0.5-2.5 (3) MG/3ML IN SOLN
3.0000 mL | Freq: Once | RESPIRATORY_TRACT | Status: AC
  Administered 2022-10-11: 3 mL via RESPIRATORY_TRACT
  Filled 2022-10-11: qty 3

## 2022-10-11 MED ORDER — ALBUTEROL SULFATE (2.5 MG/3ML) 0.083% IN NEBU
2.5000 mg | INHALATION_SOLUTION | Freq: Once | RESPIRATORY_TRACT | Status: AC
  Administered 2022-10-11: 2.5 mg via RESPIRATORY_TRACT
  Filled 2022-10-11: qty 3

## 2022-10-11 NOTE — ED Provider Notes (Signed)
[facility] EMERGENCY DEPARTMENT Provider Note   CSN: [id number] Arrival date & time: 10/11/22  1023     History {Add pertinent medical, surgical, social history, OB history to HPI:1} Chief Complaint  Patient presents with   Shortness of Breath    [name] is a 76 y.o. male.  Patient complains of shortness of breath for a number days.  Patient has significant COPD.  He is to follow-up with pulmonology in a couple weeks   Shortness of Breath      Home Medications Prior to Admission medications   Medication Sig Start Date End Date Taking? Authorizing Provider  predniSONE (DELTASONE) 10 MG tablet Take 2 tablets (20 mg total) by mouth daily. 10/11/22  Yes Bethann Berkshire, MD  albuterol (PROAIR HFA) [phone] Base) MCG/ACT inhaler 2 puffs every 4 hours as needed only  if your can't catch your breath 04/16/22   Nyoka Cowden, MD  albuterol (PROVENTIL) (2.5 MG/3ML) 0.083% nebulizer solution USE 1 VIAL IN NEBULIZER EVERY 6 HOURS AS NEEDED FOR WHEEZING OR SHORTNESS OF BREATH. 05/29/22   Nyoka Cowden, MD  BREZTRI AEROSPHERE 160-9-4.8 MCG/ACT AERO 2 puffs once a day 08/09/22   Nyoka Cowden, MD  budesonide-formoterol (SYMBICORT) 160-4.5 MCG/ACT inhaler Inhale 2 puffs into the lungs 2 (two) times daily.    [provider]  ipratropium-albuterol (DUONEB) 0.5-2.5 (3) MG/3ML SOLN Inhale 3 mLs into the lungs every 4 (four) hours as needed (for shortness of breath). 09/14/15   Erick Blinks, MD  levothyroxine (SYNTHROID) 50 MCG tablet Take 50 mcg by mouth daily before breakfast.    [provider]  Misc. Devices (PULSE OXIMETER) MISC 1 Device by Does not apply route daily as needed. 04/16/22   Nyoka Cowden, MD  rosuvastatin (CRESTOR) 20 MG tablet Take 20 mg by mouth daily.    [provider]      Allergies    Tiotropium bromide monohydrate, Trelegy ellipta [fluticasone-umeclidin-vilant], Breztri aerosphere [budeson-glycopyrrol-formoterol], and Morphine  and related    Review of Systems   Review of Systems  Respiratory:  Positive for shortness of breath.     Physical Exam Updated Vital Signs BP 111/61   Pulse 78   Temp 98.1 F (36.7 C) (Oral)   Resp 12   Ht 5\' 10"  (1.778 m)   Wt 83.9 kg   SpO2 100%   BMI 26.54 kg/m  Physical Exam  ED Results / Procedures / Treatments   Labs (all labs ordered are listed, but only abnormal results are displayed) Labs Reviewed  CBC WITH DIFFERENTIAL/PLATELET - Abnormal; Notable for the following components:      Result Value   WBC 12.3 (*)    RBC 4.19 (*)    MCV 101.2 (*)    Neutro Abs 10.1 (*)    All other components within normal limits  COMPREHENSIVE METABOLIC PANEL - Abnormal; Notable for the following components:   CO2 35 (*)    Glucose, Bld 112 (*)    All other components within normal limits    EKG None  Radiology DG Chest Port 1 View  Result Date: 10/11/2022 CLINICAL DATA:  Shortness of breath EXAM: PORTABLE CHEST 1 VIEW COMPARISON:  Chest radiograph dated 02/25/2020 FINDINGS: Lines/tubes: Left chest wall pacemaker leads terminate over the right atrium and ventricle. Status post aortic valve replacement. Surgical clips project over the right axillary region Chest: Patchy left basilar opacities. Medial right upper lung linear scarring. Pleura: Blunting of the left costophrenic.  No pneumothorax.  Heart/mediastinum: The heart size and mediastinal contours are within normal limits. Bones: Median sternotomy wires are nondisplaced. IMPRESSION: 1. Patchy left basilar opacities may represent atelectasis or pneumonia. 2. Blunting of the left costophrenic angle may represent a small pleural effusion. Electronically Signed   By: Agustin Cree M.D.   On: 10/11/2022 11:25    Procedures Procedures  {Document cardiac monitor, telemetry assessment procedure when appropriate:1}  Medications Ordered in ED Medications  methylPREDNISolone sodium succinate (SOLU-MEDROL) 125 mg/2 mL injection 125 mg  (125 mg Intravenous Not Given 10/11/22 1128)  magnesium sulfate IVPB 2 g 50 mL (2 g Intravenous New Bag/Given 10/11/22 1137)  ipratropium-albuterol (DUONEB) 0.5-2.5 (3) MG/3ML nebulizer solution 3 mL (3 mLs Nebulization Given 10/11/22 1131)  albuterol (PROVENTIL) (2.5 MG/3ML) 0.083% nebulizer solution 2.5 mg (2.5 mg Nebulization Given 10/11/22 1131)    ED Course/ Medical Decision Making/ A&P                           Medical Decision Making Amount and/or Complexity of Data Reviewed Labs: ordered. Radiology: ordered.  Risk Prescription drug management.   Patient with exacerbation of COPD.  He improved with neb treatments.  We will start him on some prednisone and cover him with some Levaquin for possible 9 and will follow-up with PCP  {Document critical care time when appropriate:1} {Document review of labs and clinical decision tools ie heart score, Chads2Vasc2 etc:1}  {Document your independent review of radiology images, and any outside records:1} {Document your discussion with family members, caretakers, and with consultants:1} {Document social determinants of health affecting pt's care:1} {Document your decision making why or why not admission, treatments were needed:1} Final Clinical Impression(s) / ED Diagnoses Final diagnoses:  COPD exacerbation (HCC)    Rx / DC Orders ED Discharge Orders          Ordered    predniSONE (DELTASONE) 10 MG tablet  Daily        10/11/22 1411

## 2022-10-11 NOTE — ED Notes (Signed)
Requested ambulance transport for pt back home. Pt has refused due to the unknown time of arrival of ambulance. Pt then had a visitor who he asked to take hime home, and visitor refused. Pt requested taxi transportation be called for transportation because he did not want to wait. [employer] called and gave a 2 hour ETA. Talked with pt at length regarding his need for oxygen during transport. Pt stated he would be ok for the 20-30 minute ride home, even though he had difficulty walking from the bathroom to his ER room. Pt was aware of the risks of leaving without oxygen and took responsibility

## 2022-10-11 NOTE — ED Triage Notes (Signed)
[facility]- reports short of breath since approx 0500 today. EMS administered 2 duonebs tx, 125mg  solumedrol. Pt reported nausea, 4mg  zofran IV given. Pt then reported chest tightness, so was given 324mg  ASA and 2 NTG SL with some relief. Hx of COPD, wears 3L oxygen at baseline

## 2022-10-11 NOTE — Discharge Instructions (Signed)
Follow-up with your lung doctor as planned.  If you have any problems before then just return to the emergency department

## 2022-10-13 ENCOUNTER — Other Ambulatory Visit: Payer: Self-pay

## 2022-10-13 ENCOUNTER — Emergency Department (HOSPITAL_COMMUNITY): Payer: Medicare PPO

## 2022-10-13 ENCOUNTER — Encounter (HOSPITAL_COMMUNITY): Payer: Self-pay

## 2022-10-13 ENCOUNTER — Observation Stay (HOSPITAL_COMMUNITY)
Admission: EM | Admit: 2022-10-13 | Discharge: 2022-10-14 | Disposition: A | Discharge status: Home / Self Care | Payer: Medicare PPO | Attending: Internal Medicine | Admitting: Internal Medicine

## 2022-10-13 DIAGNOSIS — K56609 Unspecified intestinal obstruction, unspecified as to partial versus complete obstruction: Secondary | ICD-10-CM | POA: Diagnosis present

## 2022-10-13 DIAGNOSIS — J9621 Acute and chronic respiratory failure with hypoxia: Secondary | ICD-10-CM | POA: Diagnosis present

## 2022-10-13 DIAGNOSIS — Z720 Tobacco use: Secondary | ICD-10-CM | POA: Diagnosis present

## 2022-10-13 DIAGNOSIS — Z9981 Dependence on supplemental oxygen: Secondary | ICD-10-CM | POA: Insufficient documentation

## 2022-10-13 DIAGNOSIS — I5042 Chronic combined systolic (congestive) and diastolic (congestive) heart failure: Secondary | ICD-10-CM | POA: Diagnosis present

## 2022-10-13 DIAGNOSIS — R0789 Other chest pain: Secondary | ICD-10-CM | POA: Insufficient documentation

## 2022-10-13 DIAGNOSIS — J441 Chronic obstructive pulmonary disease with (acute) exacerbation: Secondary | ICD-10-CM | POA: Diagnosis not present

## 2022-10-13 DIAGNOSIS — R7989 Other specified abnormal findings of blood chemistry: Secondary | ICD-10-CM | POA: Diagnosis not present

## 2022-10-13 DIAGNOSIS — E785 Hyperlipidemia, unspecified: Secondary | ICD-10-CM | POA: Diagnosis present

## 2022-10-13 DIAGNOSIS — Z87891 Personal history of nicotine dependence: Secondary | ICD-10-CM | POA: Diagnosis not present

## 2022-10-13 DIAGNOSIS — Z79899 Other long term (current) drug therapy: Secondary | ICD-10-CM | POA: Diagnosis not present

## 2022-10-13 DIAGNOSIS — Z7951 Long term (current) use of inhaled steroids: Secondary | ICD-10-CM | POA: Insufficient documentation

## 2022-10-13 DIAGNOSIS — Z1152 Encounter for screening for COVID-19: Secondary | ICD-10-CM | POA: Insufficient documentation

## 2022-10-13 DIAGNOSIS — Z952 Presence of prosthetic heart valve: Secondary | ICD-10-CM | POA: Insufficient documentation

## 2022-10-13 DIAGNOSIS — Z950 Presence of cardiac pacemaker: Secondary | ICD-10-CM | POA: Diagnosis present

## 2022-10-13 DIAGNOSIS — I2510 Atherosclerotic heart disease of native coronary artery without angina pectoris: Secondary | ICD-10-CM | POA: Diagnosis not present

## 2022-10-13 DIAGNOSIS — R0602 Shortness of breath: Secondary | ICD-10-CM | POA: Diagnosis present

## 2022-10-13 LAB — COMPREHENSIVE METABOLIC PANEL
ALT: 30 U/L (ref 0–44)
AST: 24 U/L (ref 15–41)
Albumin: 3.6 g/dL (ref 3.5–5.0)
Alkaline Phosphatase: 62 U/L (ref 38–126)
Anion gap: 5 (ref 5–15)
BUN: 19 mg/dL (ref 8–23)
CO2: 32 mmol/L (ref 22–32)
Calcium: 9.1 mg/dL (ref 8.9–10.3)
Chloride: 101 mmol/L (ref 98–111)
Creatinine, Ser: 0.85 mg/dL (ref 0.61–1.24)
GFR, Estimated: >60 mL/min (ref >60)
Glucose, Bld: 120 mg/dL — ABNORMAL HIGH (ref 70–99)
Potassium: 4.8 mmol/L (ref 3.5–5.1)
Sodium: 138 mmol/L (ref 135–145)
Total Bilirubin: 0.6 mg/dL (ref 0.3–1.2)
Total Protein: 6.6 g/dL (ref 6.5–8.1)

## 2022-10-13 LAB — CBC
HCT: 41.8 % (ref 39.0–52.0)
Hemoglobin: 13.4 g/dL (ref 13.0–17.0)
MCH: 32.4 pg (ref 26.0–34.0)
MCHC: 32.1 g/dL (ref 30.0–36.0)
MCV: 101 fL — ABNORMAL HIGH (ref 80.0–100.0)
Platelets: 203 10*3/uL (ref 150–400)
RBC: 4.14 MIL/uL — ABNORMAL LOW (ref 4.22–5.81)
RDW: 13.3 % (ref 11.5–15.5)
WBC: 12.8 10*3/uL — ABNORMAL HIGH (ref 4.0–10.5)
nRBC: 0 % (ref 0.0–0.2)

## 2022-10-13 LAB — D-DIMER, QUANTITATIVE: D-Dimer, Quant: 0.59 ug/mL-FEU — ABNORMAL HIGH (ref 0.00–0.50)

## 2022-10-13 LAB — RESP PANEL BY RT-PCR (RSV, FLU A&B, COVID)  RVPGX2
Influenza A by PCR: NEGATIVE
Influenza B by PCR: NEGATIVE
Resp Syncytial Virus by PCR: NEGATIVE
SARS Coronavirus 2 by RT PCR: NEGATIVE

## 2022-10-13 LAB — TROPONIN I (HIGH SENSITIVITY): Troponin I (High Sensitivity): 7 ng/L (ref <18)

## 2022-10-13 LAB — CBG MONITORING, ED: Glucose-Capillary: 109 mg/dL — ABNORMAL HIGH (ref 70–99)

## 2022-10-13 LAB — BRAIN NATRIURETIC PEPTIDE: B Natriuretic Peptide: 86 pg/mL (ref 0.0–100.0)

## 2022-10-13 MED ORDER — NICOTINE 21 MG/24HR TD PT24
21.0000 mg | MEDICATED_PATCH | Freq: Every day | TRANSDERMAL | Status: DC
  Filled 2022-10-13 (×2): qty 1

## 2022-10-13 MED ORDER — METHYLPREDNISOLONE SODIUM SUCC 125 MG IJ SOLR
125.0000 mg | Freq: Once | INTRAMUSCULAR | Status: AC
  Administered 2022-10-13: 125 mg via INTRAVENOUS
  Filled 2022-10-13: qty 2

## 2022-10-13 MED ORDER — ALBUTEROL (5 MG/ML) CONTINUOUS INHALATION SOLN: 10.0000 mg | INHALATION_SOLUTION | RESPIRATORY_TRACT | Status: DC

## 2022-10-13 MED ORDER — ALBUTEROL (5 MG/ML) CONTINUOUS INHALATION SOLN
10.0000 mg | INHALATION_SOLUTION | RESPIRATORY_TRACT | Status: AC
  Administered 2022-10-13: 10 mg via RESPIRATORY_TRACT
  Filled 2022-10-13: qty 20

## 2022-10-13 MED ORDER — LEVOFLOXACIN 500 MG PO TABS
500.0000 mg | ORAL_TABLET | Freq: Every day | ORAL | Status: DC
  Administered 2022-10-13: 500 mg via ORAL
  Filled 2022-10-13 (×2): qty 1

## 2022-10-13 MED ORDER — LORAZEPAM 2 MG/ML IJ SOLN
0.5000 mg | Freq: Four times a day (QID) | INTRAMUSCULAR | Status: DC | PRN
  Administered 2022-10-13: 0.5 mg via INTRAVENOUS
  Filled 2022-10-13: qty 1

## 2022-10-13 MED ORDER — BUDESONIDE 0.5 MG/2ML IN SUSP
2.0000 mg | Freq: Two times a day (BID) | RESPIRATORY_TRACT | Status: DC
  Administered 2022-10-13: 2 mg via RESPIRATORY_TRACT

## 2022-10-13 MED ORDER — ARFORMOTEROL TARTRATE 15 MCG/2ML IN NEBU
15.0000 ug | INHALATION_SOLUTION | Freq: Two times a day (BID) | RESPIRATORY_TRACT | Status: DC
  Administered 2022-10-13 – 2022-10-14 (×2): 15 ug via RESPIRATORY_TRACT
  Filled 2022-10-13 (×2): qty 2

## 2022-10-13 MED ORDER — METHYLPREDNISOLONE SODIUM SUCC 40 MG IJ SOLR
40.0000 mg | Freq: Two times a day (BID) | INTRAMUSCULAR | Status: DC
  Administered 2022-10-13: 40 mg via INTRAVENOUS
  Filled 2022-10-13: qty 1

## 2022-10-13 MED ORDER — BUDESONIDE 0.5 MG/2ML IN SUSP
0.5000 mg | Freq: Two times a day (BID) | RESPIRATORY_TRACT | Status: DC
  Administered 2022-10-14: 0.5 mg via RESPIRATORY_TRACT

## 2022-10-13 MED ORDER — IPRATROPIUM-ALBUTEROL 0.5-2.5 (3) MG/3ML IN SOLN
3.0000 mL | Freq: Four times a day (QID) | RESPIRATORY_TRACT | Status: DC
  Administered 2022-10-13 – 2022-10-14 (×3): 3 mL via RESPIRATORY_TRACT
  Filled 2022-10-13 (×3): qty 3

## 2022-10-13 MED ORDER — PREDNISONE 20 MG PO TABS: 40.0000 mg | ORAL_TABLET | Freq: Every day | ORAL | Status: DC

## 2022-10-13 NOTE — ED Provider Notes (Signed)
[name] EMERGENC[facility] Provider Note   CSN: [id number] Arrival date & time: 10/13/22  1200     History  Chief Complaint  Patient presents with   Shortness of Breath    [name] is a 76 y.o. male.   Shortness of Breath  This patient is a 76 year old male, history of COPD, fairly advanced, also has surgery with an porcine valve, has oxygen requirements up to 4 L at home which he uses on an oxygen concentrator.  He presented a couple of days ago with shortness of breath was treated in the ER but wanted to go home, he felt like he was good enough to go home but has been more short of breath since going home.  This despite using prednisone, Levaquin and multiple albuterol treatments.  He does have the occasional cough but it is mostly wheezing and tightness, he has no fevers or chills, no increasing swelling of the legs but does have some chronic edema.  Home Medications Prior to Admission medications   Medication Sig Start Date End Date Taking? Authorizing Provider  albuterol (PROAIR HFA) 108 (90 Base) MCG/ACT inhaler 2 puffs every 4 hours as needed only  if your can't catch your breath 04/16/22   Tanda Rockers, MD  albuterol (PROVENTIL) (2.5 MG/3ML) 0.083% nebulizer solution USE 1 VIAL IN NEBULIZER EVERY 6 HOURS AS NEEDED FOR WHEEZING OR SHORTNESS OF BREATH. 05/29/22   Tanda Rockers, MD  BREZTRI AEROSPHERE 160-9-4.8 MCG/ACT AERO 2 puffs once a day 08/09/22   Tanda Rockers, MD  budesonide-formoterol (SYMBICORT) 160-4.5 MCG/ACT inhaler Inhale 2 puffs into the lungs 2 (two) times daily.    [provider]  ipratropium-albuterol (DUONEB) 0.5-2.5 (3) MG/3ML SOLN Inhale 3 mLs into the lungs every 4 (four) hours as needed (for shortness of breath). 09/14/15   Kathie Dike, MD  levofloxacin (LEVAQUIN) 500 MG tablet Take 1 tablet (500 mg total) by mouth daily. 10/11/22   Milton Ferguson, MD  levothyroxine (SYNTHROID) 50 MCG tablet Take 50 mcg by mouth daily before  breakfast.    [provider]  Misc. Devices (PULSE OXIMETER) MISC 1 Device by Does not apply route daily as needed. 04/16/22   Tanda Rockers, MD  predniSONE (DELTASONE) 10 MG tablet Take 2 tablets (20 mg total) by mouth daily. 10/11/22   Milton Ferguson, MD  rosuvastatin (CRESTOR) 20 MG tablet Take 20 mg by mouth daily.    [provider]      Allergies    Tiotropium bromide monohydrate, Trelegy ellipta [fluticasone-umeclidin-vilant], Breztri aerosphere [budeson-glycopyrrol-formoterol], and Morphine and related    Review of Systems   Review of Systems  Respiratory:  Positive for shortness of breath.   All other systems reviewed and are negative.   Physical Exam Updated Vital Signs BP 123/89   Pulse 89   Resp 20   Ht 1.778 m (5\' 10" )   Wt 83.9 kg   SpO2 98%   BMI 26.54 kg/m  Physical Exam Vitals and nursing note reviewed.  Constitutional:      General: He is in acute distress.     Appearance: He is well-developed.  HENT:     Head: Normocephalic and atraumatic.     Mouth/Throat:     Pharynx: No oropharyngeal exudate.  Eyes:     General: No scleral icterus.       Right eye: No discharge.        Left eye: No discharge.     Conjunctiva/sclera: Conjunctivae normal.  Pupils: Pupils are equal, round, and reactive to light.  Neck:     Thyroid: No thyromegaly.     Vascular: No JVD.  Cardiovascular:     Rate and Rhythm: Normal rate and regular rhythm.     Heart sounds: Normal heart sounds. No murmur heard.    No friction rub. No gallop.  Pulmonary:     Effort: Respiratory distress present.     Breath sounds: Wheezing present. No rales.     Comments: Increased work of breathing with prolonged expiratory phase, diffuse wheezing, decreased lung sounds, diminished breath sounds, tachypnea, speaks in shortened sentences Abdominal:     General: Bowel sounds are normal. There is no distension.     Palpations: Abdomen is soft. There is no mass.     Tenderness:  There is no abdominal tenderness.  Musculoskeletal:        General: No tenderness. Normal range of motion.     Cervical back: Normal range of motion and neck supple.     Right lower leg: No tenderness. No edema.     Left lower leg: No tenderness. Edema present.  Lymphadenopathy:     Cervical: No cervical adenopathy.  Skin:    General: Skin is warm and dry.     Findings: No erythema or rash.  Neurological:     Mental Status: He is alert.     Coordination: Coordination normal.  Psychiatric:        Behavior: Behavior normal.     ED Results / Procedures / Treatments   Labs (all labs ordered are listed, but only abnormal results are displayed) Labs Reviewed - No data to display  EKG None  Radiology No results found.  Procedures Procedures    Medications Ordered in ED Medications - No data to display  ED Course/ Medical Decision Making/ A&P                           Medical Decision Making Amount and/or Complexity of Data Reviewed Labs: ordered. Radiology: ordered.  Risk Prescription drug management. Decision regarding hospitalization.   This patient presents to the ED for concern of shortness of breath, this involves an extensive number of treatment options, and is a complaint that carries with it a high risk of complications and morbidity.  The differential diagnosis includes COPD exacerbation, hypercapnic respiratory failure, hypoxic respiratory failure, CHF, cardiac ischemia, pulmonary embolism seems less likely   Co morbidities that complicate the patient evaluation  Severe COPD, valve replacement   Additional history obtained:  Additional history obtained from electronic medical record External records from outside source obtained and reviewed including prior ED visits and office visits for his chronic tobacco use and COPD, he follows with pulmonology locally.  His last admission to the [name] was many years ago in 2016 according to our notes.   Lab  Tests:  I Ordered, and personally interpreted labs.  The pertinent results include: COVID-negative, flu negative, CBC with a leukocytosis of 12,800 (on steroids), metabolic panel is unremarkable.  Troponin is 7, BNP is 86 glucose of 109   Imaging Studies ordered:  I ordered imaging studies including chest x-ray I independently visualized and interpreted imaging which showed slight fusion, no other findings including pneumothorax or pneumonia I agree with the radiologist interpretation   Cardiac Monitoring: / EKG:  The patient was maintained on a cardiac monitor.  I personally viewed and interpreted the cardiac monitored which showed an underlying rhythm of: Normal  sinus rhythm, borderline tachycardia   Consultations Obtained:  I requested consultation with the hospitalist,  and discussed lab and imaging findings as well as pertinent plan - they recommend: They will admit to the [name].   Problem List / ED Course / Critical interventions / Medication management  Patient has COPD, has ongoing respiratory concern despite being given multiple treatments I ordered medication including albuterol and Solu-Medrol for respiratory distress and COPD exacerbation Reevaluation of the patient after these medicines showed that the patient improved but still needs admission for ongoing work of breathing I have reviewed the patients home medicines and have made adjustments as needed   Social Determinants of Health:  Prior smoker, ongoing COPD   Test / Admission - Considered:  Will admit, discussed with Dr. Renne Crigler who will admit,         Final Clinical Impression(s) / ED Diagnoses Final diagnoses:  COPD exacerbation ([place])      Noemi Chapel, MD 10/13/22 1530

## 2022-10-13 NOTE — ED Notes (Signed)
Patient continuously pushes his call bell  every 15 minutes asking why the doctor has not got him discharged or admitted yet. I explained to him that the doctor would be in when he was able and results were back. Patient stated "I am a spoiled old man and expect to be treated like I am the only patient here when someone is in my presence even though I know there are sick patients here".

## 2022-10-13 NOTE — H&P (Addendum)
History and Physical    [name] XNA:[id number] DOB: [birth year] DOA: 10/13/2022  I have briefly reviewed the patient's prior medical records in [facility]  PCP: Alvina Filbert, MD  Patient coming from: home  Chief Complaint: Shortness of breath  HPI: [name] is a 76 y.o. 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  This has been going on for the last 4 to 5 days, he was seen in the ED 2 days ago but refused admission and left AMA.  He did get some steroids upon discharge, has been taking them at home but despite that he has been having persistent wheezing or shortness of breath and heavy sensation on his chest and he decided to come back.  Last time he smoked was before coming into the ER 2 days ago.  He denies any fever or chills, complains of a cough but is nonproductive.  He denies any chest pains.  ED Course: In the ED temp is 99.1, has significant wheezing and has received several breathing treatments.  He is requiring 4 L nasal cannula satting 100%.  Chest x-ray is unchanged from couple days ago, possible mild bibasilar opacities/atelectasis, emphysema.  Due to persistent dyspnea we are asked to admit.  COVID/flu negative  Review of Systems: All systems reviewed, and apart from HPI, all negative  Past Medical History:  Diagnosis Date   Abnormal thyroid blood test 09/11/2015   Anxiety and depression    Bipolar disorder (HCC)    BPH (benign prostatic hyperplasia)    CAD (coronary artery disease)    ?MI in 2007   CHF (congestive heart failure) (HCC)    Chronic combined systolic and diastolic CHF (congestive heart failure) (HCC) 12/05/2014   COPD (chronic obstructive pulmonary disease) (HCC)    COPD (chronic  obstructive pulmonary disease) (HCC)    Homelessness    Neuropathic pain    Pacemaker    battery replaced 06/2014 at [facility]   Pneumothorax    traumatic 2007   Psychosis [facility])    Severe aortic valve stenosis    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 at [facility]   SSS (sick sinus syndrome) [facility])    s/p PPM    Thoracic ascending aortic aneurysm (HCC)    repaired 06/2014 at [facility]   Tobacco abuse     Past Surgical History:  Procedure Laterality Date   AORTIC VALVE REPLACEMENT     porcine valve   CARDIAC SURGERY     ascending thoracic aneurysm repair 06/2014   HIATAL HERNIA REPAIR     KNEE SURGERY     L5-6   LUMBAR FUSION     PACEMAKER PLACEMENT  2007   battery replaced 07/04/2014     reports that he has quit smoking. His smoking use included cigarettes. He has a 35.00 pack-year smoking history. He has never used smokeless tobacco. He reports current alcohol use. He reports current drug use. Drug: Marijuana.  Allergies  Allergen Reactions   Tiotropium Bromide Monohydrate Nausea And Vomiting, Other (See Comments) and Nausea Only    Tremors Reaction:  Tremors   Trelegy Ellipta [Fluticasone-Umeclidin-Vilant] Other (See Comments)    Patient reports that his tongue swells.    Breztri Aerosphere [Budeson-Glycopyrrol-Formoterol] Nausea Only   Morphine And Related Other (See Comments)  Reaction:  Hallucinations     Family History  Problem Relation Age of Onset   CAD Father    Diabetes Father    High blood pressure Brother    Cancer Brother     Prior to Admission medications   Medication Sig Start Date End Date Taking? Authorizing Provider  albuterol (PROAIR HFA) 108 (90 Base) MCG/ACT inhaler 2 puffs every 4 hours as needed only  if your can't catch your breath 04/16/22   Nyoka Cowden, MD  albuterol (PROVENTIL) (2.5 MG/3ML) 0.083% nebulizer solution USE 1 VIAL IN NEBULIZER EVERY 6 HOURS AS NEEDED FOR WHEEZING OR SHORTNESS OF BREATH. 05/29/22   Nyoka Cowden, MD  BREZTRI AEROSPHERE  160-9-4.8 MCG/ACT AERO 2 puffs once a day 08/09/22   Nyoka Cowden, MD  budesonide-formoterol (SYMBICORT) 160-4.5 MCG/ACT inhaler Inhale 2 puffs into the lungs 2 (two) times daily.    [provider]  ipratropium-albuterol (DUONEB) 0.5-2.5 (3) MG/3ML SOLN Inhale 3 mLs into the lungs every 4 (four) hours as needed (for shortness of breath). 09/14/15   Erick Blinks, MD  levofloxacin (LEVAQUIN) 500 MG tablet Take 1 tablet (500 mg total) by mouth daily. 10/11/22   Bethann Berkshire, MD  levothyroxine (SYNTHROID) 50 MCG tablet Take 50 mcg by mouth daily before breakfast.    [provider]  Misc. Devices (PULSE OXIMETER) MISC 1 Device by Does not apply route daily as needed. 04/16/22   Nyoka Cowden, MD  predniSONE (DELTASONE) 10 MG tablet Take 2 tablets (20 mg total) by mouth daily. 10/11/22   Bethann Berkshire, MD  rosuvastatin (CRESTOR) 20 MG tablet Take 20 mg by mouth daily.    [provider]    Physical Exam: Vitals:   10/13/22 1228 10/13/22 1230  BP: 123/89 119/83  Pulse: 89 87  Resp: 20 19  Temp:  99.1 F (37.3 C)  TempSrc:  Oral  SpO2: 98% 100%  Weight:  83.9 kg  Height:  5\' 10"  (1.778 m)    Constitutional: Tachypneic at times especially when he speaks Eyes: PERRL, lids and conjunctivae normal ENMT: Mucous membranes are moist. Neck: normal, supple Respiratory: Overall distant breath sounds, faint end expiratory wheezing present.  Increased respiratory effort Cardiovascular: Regular rate and rhythm, no murmurs / rubs / gallops.  Left lower extremity edema more than the right Abdomen: no tenderness, no masses palpated. Bowel sounds positive.  Musculoskeletal: no clubbing / cyanosis. Normal muscle tone.  Skin: no rashes, lesions, ulcers. No induration Neurologic: CN 2-12 grossly intact. Strength 5/5 in all 4.  Psychiatric: Normal judgment and insight. Alert and oriented x 3. Normal mood.   Labs on Admission: I have personally reviewed following labs and  imaging studies  CBC: Recent Labs  Lab 10/11/22 1137 10/13/22 1319  WBC 12.3* 12.8*  NEUTROABS 10.1*  --   HGB 13.3 13.4  HCT 42.4 41.8  MCV 101.2* 101.0*  PLT 185 203   Basic Metabolic Panel: Recent Labs  Lab 10/11/22 1137 10/13/22 1319  NA 139 138  K 4.8 4.8  CL 98 101  CO2 35* 32  GLUCOSE 112* 120*  BUN 12 19  CREATININE 0.85 0.85  CALCIUM 9.1 9.1   Liver Function Tests: Recent Labs  Lab 10/11/22 1137 10/13/22 1319  AST 27 24  ALT 29 30  ALKPHOS 66 62  BILITOT 0.6 0.6  PROT 6.7 6.6  ALBUMIN 3.7 3.6   Coagulation Profile: No results for input(s): "INR", "PROTIME" in the last 168 hours. BNP (last 3  results) No results for input(s): "PROBNP" in the last 8760 hours. CBG: Recent Labs  Lab 10/13/22 1304  GLUCAP 109*   Thyroid Function Tests: No results for input(s): "TSH", "T4TOTAL", "FREET4", "T3FREE", "THYROIDAB" in the last 72 hours. Urine analysis: No results found for: "COLORURINE", "APPEARANCEUR", "LABSPEC", "PHURINE", "GLUCOSEU", "HGBUR", "BILIRUBINUR", "KETONESUR", "PROTEINUR", "UROBILINOGEN", "NITRITE", "LEUKOCYTESUR"   Radiological Exams on Admission: DG Chest Port 1 View  Result Date: 10/13/2022 CLINICAL DATA:  Shortness of breath.  COPD. EXAM: PORTABLE CHEST 1 VIEW COMPARISON:  10/11/2022 and prior radiographs FINDINGS: Aortic valve replacement and LEFT-sided pacemaker again noted. The cardiomediastinal silhouette is unchanged. Emphysema again noted. Mild bibasilar opacities are unchanged. A possible trace LEFT pleural effusion again noted. There is no evidence of pneumothorax. No acute bony abnormalities are present. Remote rib fractures are noted. IMPRESSION: Unchanged appearance of the chest with mild bibasilar opacities/atelectasis and possible trace LEFT pleural effusion. Emphysema. Electronically Signed   By: Harmon Pier M.D.   On: 10/13/2022 12:59    EKG: Independently reviewed.  Wide QRS, paced rhythm  Assessment/Plan Principal  problem COPD exacerbation, acute on chronic hypoxic respiratory failure -patient to be admitted to stepdown, placed on nebulizers, steroids, antibiotics with Levaquin. -Closely monitor respiratory status  Active problems Chronic combined CHF - he has left lower extremity edema more than the right, per patient this is chronic going on for the past couple years.  Seeing [facility] at [facility] for this, most recently at the beginning of 2023.  Otherwise appears euvolemic, BNP is normal.  Most recent 2D echo was in 2015 showed an EF of 40-45%.  He does not appear to be on GDMT.   Chest tightness - high-sensitivity troponin is normal.  This is most like related to his respiratory status.  Obtain a D-dimer  History of CAD, bioprosthetic aortic valve, sick sinus syndrome status post pacemaker - these appears stable, monitor on telemetry  Hypothyroidism - continue Synthroid  Hyperlipidemia - continue statin  Tobacco use - ongoing, counseled for complete cessation.  Nicotine patch ordered   DVT prophylaxis: Lovenox  Code Status: Full code  Family Communication: no family at bedside  Disposition Plan: home when ready  Bed Type: SDU Consults called: none  Obs/Inp: obs   Pamella Pert, MD, PhD [facility]  Contact via [email or web address]  10/13/2022, 3:51 PM

## 2022-10-13 NOTE — ED Notes (Signed)
Pt refuses to leave monitoring equipment on. Multiple staff members have tried multiple times and have all been cussed and yelled by pt.

## 2022-10-13 NOTE — ED Notes (Signed)
Patient in no distress. Continues to press call bell every 15-20 minutes wanting to know  "why am I not upstairs yet. Why do I not have a meal yet".  ED tech and myself explained to the patient that it was a process of getting a bed and we were working on it.

## 2022-10-13 NOTE — ED Triage Notes (Addendum)
"  Patient was here 2 days ago for COPD exacerbation. Left AMA with prescription for prednisone. Since leaving his shortness of breath has not improved. Having dyspnea upon walking. Patient is on home oxygen at 3L. O2 sats 88% on our arrival. Put him on 4L via [place] in route. Did not give solumedrol due to him actively taking prednisone daily" per EMS

## 2022-10-13 NOTE — ED Notes (Signed)
Solu-medrol ordered at 1615 adm per MD. This nurse called pharmacy to get q 12 hour times changed to match

## 2022-10-13 NOTE — ED Notes (Signed)
Pt will not let me get vitals at this time

## 2022-10-14 ENCOUNTER — Observation Stay (HOSPITAL_COMMUNITY): Payer: Medicare PPO

## 2022-10-14 ENCOUNTER — Ambulatory Visit: Payer: Medicare PPO | Admitting: Internal Medicine

## 2022-10-14 DIAGNOSIS — Z720 Tobacco use: Secondary | ICD-10-CM

## 2022-10-14 DIAGNOSIS — J441 Chronic obstructive pulmonary disease with (acute) exacerbation: Secondary | ICD-10-CM | POA: Diagnosis not present

## 2022-10-14 DIAGNOSIS — K56609 Unspecified intestinal obstruction, unspecified as to partial versus complete obstruction: Secondary | ICD-10-CM | POA: Diagnosis present

## 2022-10-14 DIAGNOSIS — J9621 Acute and chronic respiratory failure with hypoxia: Secondary | ICD-10-CM

## 2022-10-14 DIAGNOSIS — I5042 Chronic combined systolic (congestive) and diastolic (congestive) heart failure: Secondary | ICD-10-CM

## 2022-10-14 LAB — HIV ANTIBODY (ROUTINE TESTING W REFLEX): HIV Screen 4th Generation wRfx: NONREACTIVE

## 2022-10-14 MED ORDER — METHYLPREDNISOLONE SODIUM SUCC 125 MG IJ SOLR
120.0000 mg | INTRAMUSCULAR | Status: DC
  Administered 2022-10-14: 120 mg via INTRAVENOUS
  Filled 2022-10-14: qty 2

## 2022-10-14 MED ORDER — LEVOTHYROXINE SODIUM 50 MCG PO TABS: 50.0000 ug | ORAL_TABLET | Freq: Every day | ORAL | Status: DC

## 2022-10-14 MED ORDER — SODIUM CHLORIDE 0.9% FLUSH: 3.0000 mL | INTRAVENOUS | Status: DC | PRN

## 2022-10-14 MED ORDER — SODIUM CHLORIDE 0.9 % IV SOLN: 250.0000 mL | INTRAVENOUS | Status: DC | PRN

## 2022-10-14 MED ORDER — ENOXAPARIN SODIUM 40 MG/0.4ML IJ SOSY
40.0000 mg | PREFILLED_SYRINGE | INTRAMUSCULAR | Status: DC
  Filled 2022-10-14: qty 0.4

## 2022-10-14 MED ORDER — ROSUVASTATIN CALCIUM 20 MG PO TABS
20.0000 mg | ORAL_TABLET | Freq: Every day | ORAL | Status: DC
  Filled 2022-10-14: qty 1

## 2022-10-14 MED ORDER — LEVOTHYROXINE SODIUM 50 MCG PO TABS
50.0000 ug | ORAL_TABLET | Freq: Every day | ORAL | Status: DC
  Administered 2022-10-14: 50 ug via ORAL

## 2022-10-14 MED ORDER — SODIUM CHLORIDE 0.9% FLUSH: 3.0000 mL | Freq: Two times a day (BID) | INTRAVENOUS | Status: DC

## 2022-10-14 NOTE — TOC Progression Note (Signed)
  Transition of Care [facility]) Screening Note   Patient Details  Name: [name] [name] Date of Birth: 27-Dec-1945   Transition of Care [facility]) CM/SW Contact:    Elliot Gault, LCSW Phone Number: 10/14/2022, 10:46 AM    Transition of Care Department [facility]) has reviewed patient and no TOC needs have been identified at this time. We will continue to monitor patient advancement through interdisciplinary progression rounds. If new patient transition needs arise, please place a TOC consult.

## 2022-10-14 NOTE — ED Notes (Signed)
Ambulated around nurses station with nasal cannula 4L/min., SpO2 ranging from 88-96% and pulse 101bpm. Patient had to take two breaks while ambulating because he was "lightheaded."

## 2022-10-14 NOTE — Hospital Course (Addendum)
76 year old male with history of COPD, chronic respiratory failure on 3 L, systolic CHF, sick sinus syndrome status post PPM, aortic stenosis status post porcine valve 07/13/2014, bipolar disorder, hyperlipidemia, tobacco abuse, coronary artery disease presenting with 1 week history of shortness of breath and nonproductive cough.  The patient was in the emergency department on 10/11/2022.  He was treated with bronchodilators and IV steroids.  He did not want to stay for admission and left AMA.  He was given a prescription for levofloxacin and prednisone which he states he had been taking.  However he continued to have shortness of breath and coughing and wheezing.  As result he presented for further evaluation and treatment. In the emergency department, the patient had a low-grade temperature of 99.1 F.  He was hemodynamically stable.  Oxygen saturation was 98% on 4 L.  WBC 12.8, hemoglobin 13.3, platelets 203,000.  Sodium 138, potassium 4.8, bicarbonate 23, serum creatinine 0.5.  LFTs are unremarkable.  D-dimer 0.59.  BNP 86.  Chest x-ray showed bibasilar opacities, right greater than left.  The patient was given IV Solu-Medrol and started on bronchodilators.  He was admitted for further evaluation and treatment.  In the morning of 10/14/22, patient was unhappy he remained in the ED waiting for a bed on the medical floor.  He spoke with the [facility], and he remained unhappy.  He wanted to leave AMA In speaking with Jennings Books, Jennings Books has demonstrated the ability to understand his medical condition(s) which include COPD exacerbation, respiratory failure.  VALOR QUAINTANCE has demonstrated the ability to appreciate how treatment for  COPD exacerbation, respiratory failure will be beneficial.   Jennings Books has also demonstrated the ability to understand and appreciate how refusal of treatement for  COPD exacerbation, respiratory failure could result in harm, repeat hospitalization, and possibly  death.  ELDEAN NANNA demonstrates the ability to reason through the risks and benefits of the proposed treatment.  Finally, DRAYSON DORKO is able to clearly communicate his/her choice.

## 2022-10-14 NOTE — Telephone Encounter (Signed)
Community message sent to adapt. Will update encounter once message is received back.

## 2022-10-14 NOTE — Progress Notes (Addendum)
OT Cancellation Note  Patient Details Name: [name] [name] MRN: [id number] DOB: [birth year]   Cancelled Treatment:    Reason Eval/Treat Not Completed: Patient declined. Pt reported he did not want to complete physical portion of evaluation until finished eating. Per document review pt later refused to walk at all with PT staff. This therapist returned to the pt after this and the pt refused evaluation and repeatedly stated that he wanted to see the doctor. Will attempt to see pt at a later time.   Joyce Leckey OT, MOT   Danie Chandler 10/14/2022, 10:28 AM

## 2022-10-14 NOTE — ED Notes (Signed)
Patients tooth fell out after he was eating bacon. He stated the bacon was "too hard" and he informed me that he had cracked that same tooth about a year ago.

## 2022-10-14 NOTE — Progress Notes (Signed)
PT Cancellation Note  Patient Details Name: EDVARDO HONSE MRN: [id number] DOB: [birth year]   Cancelled Treatment:       Therapist attempted to see pt.  Pt stated in no uncertain terms that he was not walking.  Therapist attempted to convince pt without success.  Virgina Organ, PT CLT [phone]  10/14/2022, 9:56 AM

## 2022-10-14 NOTE — Discharge Summary (Signed)
Physician Discharge Summary   Patient: [name] [name] MRN: [id number] DOB: 1945/11/16  Admit date:     10/13/2022  Discharge date: 10/14/22  Discharge Physician: Shanon Brow Khai Torbert   PCP: Abran Richard, MD   LEFT AGAINST MEDICAL ADVICE   Hospital Course: 76 year old male with history of COPD, chronic respiratory failure on 3 L, systolic CHF, sick sinus syndrome status post PPM, aortic stenosis status post porcine valve 07/13/2014, bipolar disorder, hyperlipidemia, tobacco abuse, coronary artery disease presenting with 1 week history of shortness of breath and nonproductive cough.  The patient was in the emergency department on 10/11/2022.  He was treated with bronchodilators and IV steroids.  He did not want to stay for admission and left AMA.  He was given a prescription for levofloxacin and prednisone which he states he had been taking.  However he continued to have shortness of breath and coughing and wheezing.  As result he presented for further evaluation and treatment. In the emergency department, the patient had a low-grade temperature of 99.1 F.  He was hemodynamically stable.  Oxygen saturation was 98% on 4 L.  WBC 12.8, hemoglobin 13.3, platelets 203,000.  Sodium 138, potassium 4.8, bicarbonate 23, serum creatinine 0.5.  LFTs are unremarkable.  D-dimer 0.59.  BNP 86.  Chest x-ray showed bibasilar opacities, right greater than left.  The patient was given IV Solu-Medrol and started on bronchodilators.  He was admitted for further evaluation and treatment.  In the morning of 10/14/22, patient was unhappy he remained in the ED waiting for a bed on the medical floor.  He spoke with the [facility], and he remained unhappy.  He wanted to leave AMA In speaking with Jori Moll, Jori Moll has demonstrated the ability to understand his medical condition(s) which include COPD exacerbation, respiratory failure.  SALIF SERVELLON has demonstrated the ability to appreciate how treatment for  COPD  exacerbation, respiratory failure will be beneficial.   Jori Moll has also demonstrated the ability to understand and appreciate how refusal of treatement for  COPD exacerbation, respiratory failure could result in harm, repeat hospitalization, and possibly death.  SHAEN HUPE demonstrates the ability to reason through the risks and benefits of the proposed treatment.  Finally, DICKSON BARFIELD is able to clearly communicate his/her choice.   Assessment and Plan: Acute on chronic Respiratory Failure with hypoxia -due to COPD exacerbation -Stable on 4 L nasal cannula -Wean oxygen back to 3 L, which is his baseline   COPD exacerbation -Continue Pulmicort, Brovana -Continue IV Solu-Medrol -Continue DuoNebs   Chronic combined systolic and diastolic CHF -Clinically euvolemic -Patient has chronic left lower extremity edema for which he follows vascular surgery at [facility] -10/26/2014 echo EF 40-45%, +WMA -Patient is now on any GDMT   Chest discomfort -Troponins normal   Status post bioprosthetic aortic valve -Placed on 07/13/2014   Sick sinus syndrome -Status post PPM   Hyperlipidemia -Continue statin   Tobacco abuse -Cessation discussed   Elevated D-dimer -0.59, remains normal when adjusted for his age -very low clinical suspicion for PE         Consultants: NONE Procedures performed: NONE  Disposition: AGAINST MEDICAL ADVICE Diet recommendation:  Cardiac diet DISCHARGE MEDICATION: Allergies as of 10/14/2022       Reactions   Tiotropium Bromide Monohydrate Nausea And Vomiting, Other (See Comments), Nausea Only   Tremors Reaction:  Tremors   Trelegy Ellipta [fluticasone-umeclidin-vilant] Other (See Comments)   Patient reports that his tongue swells.  Breztri Aerosphere [budeson-glycopyrrol-formoterol] Nausea Only   Morphine And Related Other (See Comments)   Reaction:  Hallucinations         Medication List     TAKE these medications     albuterol 108 (90 Base) MCG/ACT inhaler Commonly known as: ProAir HFA 2 puffs every 4 hours as needed only  if your can't catch your breath What changed: Another medication with the same name was changed. Make sure you understand how and when to take each.   albuterol (2.5 MG/3ML) 0.083% nebulizer solution Commonly known as: PROVENTIL USE 1 VIAL IN NEBULIZER EVERY 6 HOURS AS NEEDED FOR WHEEZING OR SHORTNESS OF BREATH. What changed: See the new instructions.   Breztri Aerosphere 160-9-4.8 MCG/ACT Aero Generic drug: Budeson-Glycopyrrol-Formoterol 2 puffs once a day What changed:  how much to take how to take this when to take this   budesonide-formoterol 160-4.5 MCG/ACT inhaler Commonly known as: SYMBICORT Inhale 2 puffs into the lungs 2 (two) times daily.   ipratropium-albuterol 0.5-2.5 (3) MG/3ML Soln Commonly known as: DUONEB Inhale 3 mLs into the lungs every 4 (four) hours as needed (for shortness of breath).   levofloxacin 500 MG tablet Commonly known as: LEVAQUIN Take 1 tablet (500 mg total) by mouth daily.   levothyroxine 50 MCG tablet Commonly known as: SYNTHROID Take 50 mcg by mouth daily before breakfast.   predniSONE 10 MG tablet Commonly known as: DELTASONE Take 2 tablets (20 mg total) by mouth daily.   Pulse Oximeter Misc 1 Device by Does not apply route daily as needed.   rosuvastatin 20 MG tablet Commonly known as: CRESTOR Take 20 mg by mouth daily.   triamcinolone ointment 0.1 % Commonly known as: KENALOG Apply topically.        Discharge Exam: Filed Weights   10/13/22 1230  Weight: 83.9 kg   HEENT:  [place]/AT, No thrush, no icterus CV:  RRR, no rub, no S3, no S4 Lung:  bibasilar rales. No wheeze Abd:  soft/+BS, NT Ext:  No edema, no lymphangitis, no synovitis, no rash   Condition at discharge: [place]  The results of significant diagnostics from this hospitalization (including imaging, microbiology, ancillary and  laboratory) are listed below for reference.   Imaging Studies: US Venous Img Lower Bilateral (DVT)  Result Date: 10/14/2022 CLINICAL DATA:  Bilateral lower extremity edema. History of smoking. Patient is currently on anticoagulation. Evaluate for DVT. EXAM: BILATERAL LOWER EXTREMITY VENOUS DOPPLER ULTRASOUND TECHNIQUE: Gray-scale sonography with graded compression, as well as color Doppler and duplex ultrasound were performed to evaluate the lower extremity deep venous systems from the level of the common femoral vein and including the common femoral, femoral, profunda femoral, popliteal and calf veins including the posterior tibial, peroneal and gastrocnemius veins when visible. The superficial great saphenous vein was also interrogated. Spectral Doppler was utilized to evaluate flow at rest and with distal augmentation maneuvers in the common femoral, femoral and popliteal veins. COMPARISON:  None Available. FINDINGS: RIGHT LOWER EXTREMITY Common Femoral Vein: No evidence of thrombus. Normal compressibility, respiratory phasicity and response to augmentation. Saphenofemoral Junction: No evidence of thrombus. Normal compressibility and flow on color Doppler imaging. Profunda Femoral Vein: No evidence of thrombus. Normal compressibility and flow on color Doppler imaging. Femoral Vein: No evidence of thrombus. Normal compressibility, respiratory phasicity and response to augmentation. Popliteal Vein: No evidence of thrombus. Normal compressibility, respiratory phasicity and response to augmentation. Calf Veins: No evidence of thrombus. Normal compressibility and flow on color Doppler imaging. Superficial Great  Saphenous Vein: No evidence of thrombus. Normal compressibility. Other Findings:  None. LEFT LOWER EXTREMITY Common Femoral Vein: No evidence of thrombus. Normal compressibility, respiratory phasicity and response to augmentation. Saphenofemoral Junction: No evidence of thrombus. Normal compressibility  and flow on color Doppler imaging. Profunda Femoral Vein: No evidence of thrombus. Normal compressibility and flow on color Doppler imaging. Femoral Vein: No evidence of thrombus. Normal compressibility, respiratory phasicity and response to augmentation. Popliteal Vein: No evidence of thrombus. Normal compressibility, respiratory phasicity and response to augmentation. Calf Veins: No evidence of thrombus. Normal compressibility and flow on color Doppler imaging. Superficial Great Saphenous Vein: No evidence of thrombus. Normal compressibility. Other Findings:  None. IMPRESSION: No evidence of DVT within either lower extremity. Electronically Signed   By: Simonne Come M.D.   On: 10/14/2022 09:54   DG Chest Port 1 View  Result Date: 10/13/2022 CLINICAL DATA:  Shortness of breath.  COPD. EXAM: PORTABLE CHEST 1 VIEW COMPARISON:  10/11/2022 and prior radiographs FINDINGS: Aortic valve replacement and LEFT-sided pacemaker again noted. The cardiomediastinal silhouette is unchanged. Emphysema again noted. Mild bibasilar opacities are unchanged. A possible trace LEFT pleural effusion again noted. There is no evidence of pneumothorax. No acute bony abnormalities are present. Remote rib fractures are noted. IMPRESSION: Unchanged appearance of the chest with mild bibasilar opacities/atelectasis and possible trace LEFT pleural effusion. Emphysema. Electronically Signed   By: Harmon Pier M.D.   On: 10/13/2022 12:59   DG Chest Port 1 View  Result Date: 10/11/2022 CLINICAL DATA:  Shortness of breath EXAM: PORTABLE CHEST 1 VIEW COMPARISON:  Chest radiograph dated 02/25/2020 FINDINGS: Lines/tubes: Left chest wall pacemaker leads terminate over the right atrium and ventricle. Status post aortic valve replacement. Surgical clips project over the right axillary region Chest: Patchy left basilar opacities. Medial right upper lung linear scarring. Pleura: Blunting of the left costophrenic.  No pneumothorax. Heart/mediastinum:  The heart size and mediastinal contours are within normal limits. Bones: Median sternotomy wires are nondisplaced. IMPRESSION: 1. Patchy left basilar opacities may represent atelectasis or pneumonia. 2. Blunting of the left costophrenic angle may represent a small pleural effusion. Electronically Signed   By: Agustin Cree M.D.   On: 10/11/2022 11:25    Microbiology: Results for orders placed or performed during the hospital encounter of 10/13/22  Resp panel by RT-PCR (RSV, Flu A&B, Covid) Anterior Nasal Swab     Status: None   Collection Time: 10/13/22  1:32 PM   Specimen: Anterior Nasal Swab  Result Value Ref Range Status   SARS Coronavirus 2 by RT PCR NEGATIVE NEGATIVE Final    Comment: (NOTE) SARS-CoV-2 target nucleic acids are NOT DETECTED.  The SARS-CoV-2 RNA is generally detectable in upper respiratory specimens during the acute phase of infection. The lowest concentration of SARS-CoV-2 viral copies this assay can detect is 138 copies/mL. A negative result does not preclude SARS-Cov-2 infection and should not be used as the sole basis for treatment or other patient management decisions. A negative result may occur with  improper specimen collection/handling, submission of specimen other than nasopharyngeal swab, presence of viral mutation(s) within the areas targeted by this assay, and inadequate number of viral copies(<138 copies/mL). A negative result must be combined with clinical observations, patient history, and epidemiological information. The expected result is Negative.  Fact Sheet for Patients:  BloggerCourse.com  Fact Sheet for Healthcare Providers:  SeriousBroker.it  This test is no t yet approved or cleared by the [place] and  has been authorized for  detection and/or diagnosis of SARS-CoV-2 by FDA under an Emergency Use Authorization (EUA). This EUA will remain  in effect (meaning this test can be used) for  the duration of the COVID-19 declaration under Section 564(b)(1) of the Act, 21 U.S.C.section 360bbb-3(b)(1), unless the authorization is terminated  or revoked sooner.       Influenza A by PCR NEGATIVE NEGATIVE Final   Influenza B by PCR NEGATIVE NEGATIVE Final    Comment: (NOTE) The Xpert Xpress SARS-CoV-2/FLU/RSV plus assay is intended as an aid in the diagnosis of influenza from Nasopharyngeal swab specimens and should not be used as a sole basis for treatment. Nasal washings and aspirates are unacceptable for Xpert Xpress SARS-CoV-2/FLU/RSV testing.  Fact Sheet for Patients: EntrepreneurPulse.com.au  Fact Sheet for Healthcare Providers: IncredibleEmployment.be  This test is not yet approved or cleared by the [place] FDA and has been authorized for detection and/or diagnosis of SARS-CoV-2 by FDA under an Emergency Use Authorization (EUA). This EUA will remain in effect (meaning this test can be used) for the duration of the COVID-19 declaration under Section 564(b)(1) of the Act, 21 U.S.C. section 360bbb-3(b)(1), unless the authorization is terminated or revoked.     Resp Syncytial Virus by PCR NEGATIVE NEGATIVE Final    Comment: (NOTE) Fact Sheet for Patients: EntrepreneurPulse.com.au  Fact Sheet for Healthcare Providers: IncredibleEmployment.be  This test is not yet approved or cleared by the [place] FDA and has been authorized for detection and/or diagnosis of SARS-CoV-2 by FDA under an Emergency Use Authorization (EUA). This EUA will remain in effect (meaning this test can be used) for the duration of the COVID-19 declaration under Section 564(b)(1) of the Act, 21 U.S.C. section 360bbb-3(b)(1), unless the authorization is terminated or revoked.  Performed at [facility], [address] [place], [place] [address]     Labs: CBC: Recent Labs  Lab 10/11/22 1137 10/13/22 1319   WBC 12.3* 12.8*  NEUTROABS 10.1*  --   HGB 13.3 13.4  HCT 42.4 41.8  MCV 101.2* 101.0*  PLT 185 [id number]   Basic Metabolic Panel: Recent Labs  Lab 10/11/22 1137 10/13/22 1319  NA 139 138  K 4.8 4.8  CL 98 101  CO2 35* 32  GLUCOSE 112* 120*  BUN 12 19  CREATININE 0.85 0.85  CALCIUM 9.1 9.1   Liver Function Tests: Recent Labs  Lab 10/11/22 1137 10/13/22 1319  AST 27 24  ALT 29 30  ALKPHOS 66 62  BILITOT 0.6 0.6  PROT 6.7 6.6  ALBUMIN 3.7 3.6   CBG: Recent Labs  Lab 10/13/22 1304  GLUCAP 109*    Discharge time spent: greater than 30 minutes.  Signed: Orson Eva, MD [facility] 10/14/2022

## 2022-10-14 NOTE — Progress Notes (Signed)
PROGRESS NOTE  SHAFTER JUPIN FWY:[id number] DOB: 09-07-1946 DOA: 10/13/2022 PCP: Alvina Filbert, MD  Brief History:  76 year old male with history of COPD, chronic respiratory failure on 3 L, systolic CHF, sick sinus syndrome status post PPM, aortic stenosis status post porcine valve 07/13/2014, bipolar disorder, hyperlipidemia, tobacco abuse, coronary artery disease presenting with 1 week history of shortness of breath and nonproductive cough.  The patient was in the emergency department on 10/11/2022.  He was treated with bronchodilators and IV steroids.  He did not want to stay for admission and left AMA.  He was given a prescription for levofloxacin and prednisone which he states he had been taking.  However he continued to have shortness of breath and coughing and wheezing.  As result he presented for further evaluation and treatment. In the emergency department, the patient had a low-grade temperature of 99.1 F.  He was hemodynamically stable.  Oxygen saturation was 98% on 4 L.  WBC 12.8, hemoglobin 13.3, platelets 203,000.  Sodium 138, potassium 4.8, bicarbonate 23, serum creatinine 0.5.  LFTs are unremarkable.  D-dimer 0.59.  BNP 86.  Chest x-ray showed bibasilar opacities, right greater than left.  The patient was given IV Solu-Medrol and started on bronchodilators.  He was admitted for further evaluation and treatment.   Assessment/Plan: Acute on chronic Respiratory Failure with hypoxia -due to COPD exacerbation -Stable on 4 L nasal cannula -Wean oxygen back to 3 L, which is his baseline  COPD exacerbation -Continue Pulmicort, Brovana -Continue IV Solu-Medrol -Continue DuoNebs  Chronic combined systolic and diastolic CHF -Clinically euvolemic -Patient has chronic left lower extremity edema for which he follows vascular surgery at [facility] -10/26/2014 echo EF 40-45%, +WMA -Patient is now on any GDMT  Chest discomfort -Troponins normal  Status post bioprosthetic aortic  valve -Placed on 07/13/2014  Sick sinus syndrome -Status post PPM  Hyperlipidemia -Continue statin  Tobacco abuse -Cessation discussed  Elevated D-dimer -0.59, remains normal when adjusted for his age -very low clinical suspicion for PE    Family Communication:   no Family at bedside  Consultants:  none  Code Status:  FULL   DVT Prophylaxis:  Betances Lovenox   Procedures: As Listed in Progress Note Above  Antibiotics: Levoflox 12/17>>      Subjective: Patient still has a nonproductive cough.  He states that his breathing is better but still remains short of breath.  Denies any nausea, vomiting, direct abdominal pain, hemoptysis.  He has no chest pain presently.  Objective: Vitals:   10/13/22 2322 10/14/22 0118 10/14/22 0415 10/14/22 0451  BP: 112/75   (!) 147/85  Pulse: (!) 107  84 85  Resp: (!) 22   (!) 22  Temp:    97.8 F (36.6 C)  TempSrc:    Oral  SpO2: 98% 98% 98% 100%  Weight:      Height:       No intake or output data in the 24 hours ending 10/14/22 0637 Weight change:  Exam:  General:  Pt is alert, follows commands appropriately, not in acute distress HEENT: No icterus, No thrush, No neck mass, [place]/AT Cardiovascular: RRR, S1/S2, no rubs, no gallops Respiratory: Bibasilar rales.  Minimal basilar wheeze. Abdomen: Soft/+BS, non tender, non distended, no guarding Extremities: No edema, No lymphangitis, No petechiae, No rashes, no synovitis   Data Reviewed: I have personally reviewed following labs and imaging studies Basic Metabolic Panel: Recent Labs  Lab 10/11/22 1137 10/13/22 1319  NA 139 138  K 4.8 4.8  CL 98 101  CO2 35* 32  GLUCOSE 112* 120*  BUN 12 19  CREATININE 0.85 0.85  CALCIUM 9.1 9.1   Liver Function Tests: Recent Labs  Lab 10/11/22 1137 10/13/22 1319  AST 27 24  ALT 29 30  ALKPHOS 66 62  BILITOT 0.6 0.6  PROT 6.7 6.6  ALBUMIN 3.7 3.6   No results for input(s): "LIPASE", "AMYLASE" in the last 168 hours. No  results for input(s): "AMMONIA" in the last 168 hours. Coagulation Profile: No results for input(s): "INR", "PROTIME" in the last 168 hours. CBC: Recent Labs  Lab 10/11/22 1137 10/13/22 1319  WBC 12.3* 12.8*  NEUTROABS 10.1*  --   HGB 13.3 13.4  HCT 42.4 41.8  MCV 101.2* 101.0*  PLT 185 203   Cardiac Enzymes: No results for input(s): "CKTOTAL", "CKMB", "CKMBINDEX", "TROPONINI" in the last 168 hours. BNP: Invalid input(s): "POCBNP" CBG: Recent Labs  Lab 10/13/22 1304  GLUCAP 109*   HbA1C: No results for input(s): "HGBA1C" in the last 72 hours. Urine analysis: No results found for: "COLORURINE", "APPEARANCEUR", "LABSPEC", "PHURINE", "GLUCOSEU", "HGBUR", "BILIRUBINUR", "KETONESUR", "PROTEINUR", "UROBILINOGEN", "NITRITE", "LEUKOCYTESUR" Sepsis Labs: @LABRCNTIP (procalcitonin:4,lacticidven:4) ) Recent Results (from the past 240 hour(s))  Resp panel by RT-PCR (RSV, Flu A&B, Covid) Anterior Nasal Swab     Status: None   Collection Time: 10/13/22  1:32 PM   Specimen: Anterior Nasal Swab  Result Value Ref Range Status   SARS Coronavirus 2 by RT PCR NEGATIVE NEGATIVE Final    Comment: (NOTE) SARS-CoV-2 target nucleic acids are NOT DETECTED.  The SARS-CoV-2 RNA is generally detectable in upper respiratory specimens during the acute phase of infection. The lowest concentration of SARS-CoV-2 viral copies this assay can detect is 138 copies/mL. A negative result does not preclude SARS-Cov-2 infection and should not be used as the sole basis for treatment or other patient management decisions. A negative result may occur with  improper specimen collection/handling, submission of specimen other than nasopharyngeal swab, presence of viral mutation(s) within the areas targeted by this assay, and inadequate number of viral copies(<138 copies/mL). A negative result must be combined with clinical observations, patient history, and epidemiological information. The expected result is  Negative.  Fact Sheet for Patients:  10/15/22  Fact Sheet for Healthcare Providers:  BloggerCourse.com  This test is no t yet approved or cleared by the [place] FDA and  has been authorized for detection and/or diagnosis of SARS-CoV-2 by FDA under an Emergency Use Authorization (EUA). This EUA will remain  in effect (meaning this test can be used) for the duration of the COVID-19 declaration under Section 564(b)(1) of the Act, 21 U.S.C.section 360bbb-3(b)(1), unless the authorization is terminated  or revoked sooner.       Influenza A by PCR NEGATIVE NEGATIVE Final   Influenza B by PCR NEGATIVE NEGATIVE Final    Comment: (NOTE) The Xpert Xpress SARS-CoV-2/FLU/RSV plus assay is intended as an aid in the diagnosis of influenza from Nasopharyngeal swab specimens and should not be used as a sole basis for treatment. Nasal washings and aspirates are unacceptable for Xpert Xpress SARS-CoV-2/FLU/RSV testing.  Fact Sheet for Patients: [place]  Fact Sheet for Healthcare Providers: BloggerCourse.com  This test is not yet approved or cleared by the [place] FDA and has been authorized for detection and/or diagnosis of SARS-CoV-2 by FDA under an Emergency Use Authorization (EUA). This EUA will remain in effect (meaning this test can be used) for the duration  of the COVID-19 declaration under Section 564(b)(1) of the Act, 21 U.S.C. section 360bbb-3(b)(1), unless the authorization is terminated or revoked.     Resp Syncytial Virus by PCR NEGATIVE NEGATIVE Final    Comment: (NOTE) Fact Sheet for Patients: BloggerCourse.com  Fact Sheet for Healthcare Providers: SeriousBroker.it  This test is not yet approved or cleared by the [place] FDA and has been authorized for detection and/or diagnosis of  SARS-CoV-2 by FDA under an Emergency Use Authorization (EUA). This EUA will remain in effect (meaning this test can be used) for the duration of the COVID-19 declaration under Section 564(b)(1) of the Act, 21 U.S.C. section 360bbb-3(b)(1), unless the authorization is terminated or revoked.  Performed at [facility], [address], [place], [place] [address]      Scheduled Meds:  arformoterol  15 mcg Nebulization BID   budesonide (PULMICORT) nebulizer solution  0.5 mg Nebulization BID   enoxaparin (LOVENOX) injection  40 mg Subcutaneous Q24H   ipratropium-albuterol  3 mL Nebulization Q6H   levofloxacin  500 mg Oral Daily   levothyroxine  50 mcg Oral Q0600   methylPREDNISolone (SOLU-MEDROL) injection  40 mg Intravenous Q12H   Followed by   predniSONE  40 mg Oral Q breakfast   nicotine  21 mg Transdermal Daily   rosuvastatin  20 mg Oral Daily   sodium chloride flush  3 mL Intravenous Q12H   Continuous Infusions:  sodium chloride      Procedures/Studies: DG Chest Port 1 View  Result Date: 10/13/2022 CLINICAL DATA:  Shortness of breath.  COPD. EXAM: PORTABLE CHEST 1 VIEW COMPARISON:  10/11/2022 and prior radiographs FINDINGS: Aortic valve replacement and LEFT-sided pacemaker again noted. The cardiomediastinal silhouette is unchanged. Emphysema again noted. Mild bibasilar opacities are unchanged. A possible trace LEFT pleural effusion again noted. There is no evidence of pneumothorax. No acute bony abnormalities are present. Remote rib fractures are noted. IMPRESSION: Unchanged appearance of the chest with mild bibasilar opacities/atelectasis and possible trace LEFT pleural effusion. Emphysema. Electronically Signed   By: Harmon Pier M.D.   On: 10/13/2022 12:59   DG Chest Port 1 View  Result Date: 10/11/2022 CLINICAL DATA:  Shortness of breath EXAM: PORTABLE CHEST 1 VIEW COMPARISON:  Chest radiograph dated 02/25/2020 FINDINGS: Lines/tubes: Left chest wall pacemaker leads terminate  over the right atrium and ventricle. Status post aortic valve replacement. Surgical clips project over the right axillary region Chest: Patchy left basilar opacities. Medial right upper lung linear scarring. Pleura: Blunting of the left costophrenic.  No pneumothorax. Heart/mediastinum: The heart size and mediastinal contours are within normal limits. Bones: Median sternotomy wires are nondisplaced. IMPRESSION: 1. Patchy left basilar opacities may represent atelectasis or pneumonia. 2. Blunting of the left costophrenic angle may represent a small pleural effusion. Electronically Signed   By: Agustin Cree M.D.   On: 10/11/2022 11:25    Catarina Hartshorn, DO  [facility]  If 7PM-7AM, please contact night-coverage [email or web address] Password [email or web address] 10/14/2022, 6:37 AM   LOS: 0 days

## 2022-10-14 NOTE — ED Notes (Signed)
Upon entered the room, the patient reported that he had chipped his tooth on a piece of bacon that came in his breakfast tray. He proceeded to pull a piece of a tooth from the back of his mouth. Pt instructed to leave piece of tooth on the tray table so that EDP could see and evaluate.

## 2022-10-14 NOTE — ED Notes (Signed)
Breakfast tray given. °

## 2022-10-15 NOTE — Telephone Encounter (Signed)
Marveen Reeks  [place], [place]; [place], Miela Desjardin D, CMA Appears he spoke with the manager of the [facility] regarding his order today.  CSR Note   [id number] Cecille Amsterdam 10/14/2022 2:19:54 PM 10/14/2022 Patient CSR Note Closed   pt called in ref to poc. advised pt we are reaching out to his dr for addtional information on the rx and once we have that back, we will call him and get him scheduled to come instore to have walk test completed.  I will f/u to make sure it gets completed.        Previous Messages    ----- Message ----- From: Marveen Reeks Sent: 10/14/2022   3:25 PM EST To: Henderson Newcomer; Kathyrn Sheriff; Santina Evans; * Subject: RE: Oxygen                                    I will look into it!  ----- Message ----- From: Leane Para, CMA Sent: 10/14/2022   2:35 PM EST To: [place] Newcomer; Kathyrn Sheriff; Santina Evans; * Subject: Oxygen                                        Good Afternoon!  This patient has contacted our office multiple times about his oxygen order. Is anyone able to get an update on the status of his order and let me know if we need to order/ send anything else in for him?    Waiting for reply from Jasmine/ form from [facility] to be filled out. Will update encounter and patient once more information is received.

## 2022-10-28 NOTE — Telephone Encounter (Signed)
Called [facility] [place] office and spoke with Shanda Bumps. She states patients order was good but it was not signed or dated by provider. She states that she will have coordinator send over a form to be signed by provider and then they will be able to proceed with patients O2 order. She states it will be this week but may not be today since coordinator is out office. Provided her with my fax number [phone]. Will keep a look out for form and will call the office back if I have not received it by Friday morning.

## 2022-10-28 DEATH — deceased

## 2022-10-29 NOTE — Telephone Encounter (Signed)
Called [facility] [phone] and spoke with Mikeal Hawthorne, he states he spoke with coordinator in [place] office and coordinator is going to look into situation to see what is needed from our office at this point. Provided Mikeal Hawthorne with [facility] office number for a call back.

## 2022-10-29 NOTE — Telephone Encounter (Signed)
[name] [name](?) with family medicine in [place] called and stated that this patient has passed away. DOD: 2022-11-15

## 2022-11-11 ENCOUNTER — Ambulatory Visit: Payer: Medicare PPO | Admitting: Internal Medicine
